# Patient Record
Sex: Female | Born: 1960 | Race: White | Hispanic: No | Marital: Married | Smoking: Former smoker
Health system: Southern US, Community
[De-identification: ages and names within clinical notes are randomized; demographics above are authoritative.]

## PROBLEM LIST (undated history)

## (undated) DIAGNOSIS — F329 Major depressive disorder, single episode, unspecified: Secondary | ICD-10-CM

## (undated) DIAGNOSIS — G1221 Amyotrophic lateral sclerosis: Secondary | ICD-10-CM

## (undated) DIAGNOSIS — K5900 Constipation, unspecified: Secondary | ICD-10-CM

## (undated) DIAGNOSIS — F32A Depression, unspecified: Secondary | ICD-10-CM

## (undated) DIAGNOSIS — R339 Retention of urine, unspecified: Secondary | ICD-10-CM

## (undated) DIAGNOSIS — K219 Gastro-esophageal reflux disease without esophagitis: Secondary | ICD-10-CM

## (undated) DIAGNOSIS — R1312 Dysphagia, oropharyngeal phase: Secondary | ICD-10-CM

## (undated) HISTORY — PX: NASAL SINUS SURGERY: SHX719

## (undated) HISTORY — DX: Dysphagia, oropharyngeal phase: R13.12

## (undated) HISTORY — DX: Constipation, unspecified: K59.00

## (undated) HISTORY — DX: Gastro-esophageal reflux disease without esophagitis: K21.9

## (undated) HISTORY — DX: Retention of urine, unspecified: R33.9

---

## 2004-05-29 ENCOUNTER — Ambulatory Visit: Payer: Self-pay | Admitting: Family Medicine

## 2006-03-16 ENCOUNTER — Ambulatory Visit: Payer: Self-pay | Admitting: Gastroenterology

## 2007-08-03 ENCOUNTER — Telehealth (INDEPENDENT_AMBULATORY_CARE_PROVIDER_SITE_OTHER): Payer: Self-pay | Admitting: Internal Medicine

## 2007-08-03 ENCOUNTER — Encounter (INDEPENDENT_AMBULATORY_CARE_PROVIDER_SITE_OTHER): Payer: Self-pay | Admitting: Internal Medicine

## 2007-08-03 ENCOUNTER — Ambulatory Visit: Payer: Self-pay | Admitting: Family Medicine

## 2007-08-03 DIAGNOSIS — S335XXA Sprain of ligaments of lumbar spine, initial encounter: Secondary | ICD-10-CM

## 2007-08-03 DIAGNOSIS — R030 Elevated blood-pressure reading, without diagnosis of hypertension: Secondary | ICD-10-CM

## 2007-08-03 DIAGNOSIS — F17200 Nicotine dependence, unspecified, uncomplicated: Secondary | ICD-10-CM | POA: Insufficient documentation

## 2007-08-03 DIAGNOSIS — E782 Mixed hyperlipidemia: Secondary | ICD-10-CM

## 2007-08-03 DIAGNOSIS — J218 Acute bronchiolitis due to other specified organisms: Secondary | ICD-10-CM | POA: Insufficient documentation

## 2007-08-24 ENCOUNTER — Ambulatory Visit: Payer: Self-pay | Admitting: Family Medicine

## 2007-08-24 DIAGNOSIS — M545 Low back pain: Secondary | ICD-10-CM

## 2007-08-25 ENCOUNTER — Encounter (INDEPENDENT_AMBULATORY_CARE_PROVIDER_SITE_OTHER): Payer: Self-pay | Admitting: Internal Medicine

## 2007-08-30 ENCOUNTER — Encounter (INDEPENDENT_AMBULATORY_CARE_PROVIDER_SITE_OTHER): Payer: Self-pay | Admitting: Internal Medicine

## 2007-08-30 LAB — CONVERTED CEMR LAB
Albumin: 4.6 g/dL (ref 3.5–5.2)
Basophils Absolute: 0 10*3/uL (ref 0.0–0.1)
Direct LDL: 216.2 mg/dL
Eosinophils Absolute: 0.2 10*3/uL (ref 0.0–0.6)
GFR calc Af Amer: 99 mL/min
GFR calc non Af Amer: 82 mL/min
HCT: 42.8 % (ref 36.0–46.0)
MCHC: 34.9 g/dL (ref 30.0–36.0)
MCV: 93.7 fL (ref 78.0–100.0)
Monocytes Absolute: 0.3 10*3/uL (ref 0.2–0.7)
Neutrophils Relative %: 69.3 % (ref 43.0–77.0)
Potassium: 4.5 meq/L (ref 3.5–5.1)
RBC: 4.57 M/uL (ref 3.87–5.11)
Sodium: 141 meq/L (ref 135–145)
TSH: 1.76 microintl units/mL (ref 0.35–5.50)
Triglycerides: 150 mg/dL — ABNORMAL HIGH (ref 0–149)

## 2007-09-22 ENCOUNTER — Ambulatory Visit: Payer: Self-pay | Admitting: Cardiology

## 2007-09-28 ENCOUNTER — Telehealth (INDEPENDENT_AMBULATORY_CARE_PROVIDER_SITE_OTHER): Payer: Self-pay | Admitting: Internal Medicine

## 2007-10-04 ENCOUNTER — Ambulatory Visit: Payer: Self-pay | Admitting: Family Medicine

## 2007-10-06 ENCOUNTER — Telehealth (INDEPENDENT_AMBULATORY_CARE_PROVIDER_SITE_OTHER): Payer: Self-pay | Admitting: Internal Medicine

## 2007-10-26 ENCOUNTER — Encounter (INDEPENDENT_AMBULATORY_CARE_PROVIDER_SITE_OTHER): Payer: Self-pay | Admitting: Internal Medicine

## 2007-11-21 ENCOUNTER — Ambulatory Visit: Payer: Self-pay | Admitting: Internal Medicine

## 2008-05-03 ENCOUNTER — Ambulatory Visit: Payer: Self-pay | Admitting: Family Medicine

## 2008-05-07 ENCOUNTER — Telehealth (INDEPENDENT_AMBULATORY_CARE_PROVIDER_SITE_OTHER): Payer: Self-pay | Admitting: Internal Medicine

## 2008-05-10 ENCOUNTER — Encounter (INDEPENDENT_AMBULATORY_CARE_PROVIDER_SITE_OTHER): Payer: Self-pay | Admitting: Internal Medicine

## 2008-05-21 ENCOUNTER — Telehealth (INDEPENDENT_AMBULATORY_CARE_PROVIDER_SITE_OTHER): Payer: Self-pay | Admitting: Internal Medicine

## 2008-11-07 ENCOUNTER — Ambulatory Visit: Payer: Self-pay | Admitting: Family Medicine

## 2008-11-07 DIAGNOSIS — F329 Major depressive disorder, single episode, unspecified: Secondary | ICD-10-CM

## 2008-11-07 DIAGNOSIS — J019 Acute sinusitis, unspecified: Secondary | ICD-10-CM

## 2008-12-04 ENCOUNTER — Telehealth (INDEPENDENT_AMBULATORY_CARE_PROVIDER_SITE_OTHER): Payer: Self-pay | Admitting: Internal Medicine

## 2009-04-11 ENCOUNTER — Ambulatory Visit: Payer: Self-pay | Admitting: Otolaryngology

## 2009-07-02 ENCOUNTER — Encounter: Payer: Self-pay | Admitting: Family Medicine

## 2009-07-09 ENCOUNTER — Telehealth (INDEPENDENT_AMBULATORY_CARE_PROVIDER_SITE_OTHER): Payer: Self-pay | Admitting: Internal Medicine

## 2009-11-27 ENCOUNTER — Encounter: Payer: Self-pay | Admitting: Family Medicine

## 2009-12-03 ENCOUNTER — Ambulatory Visit: Payer: Self-pay | Admitting: Family Medicine

## 2009-12-03 DIAGNOSIS — R1319 Other dysphagia: Secondary | ICD-10-CM

## 2009-12-04 LAB — CONVERTED CEMR LAB
Basophils Absolute: 0 10*3/uL (ref 0.0–0.1)
Bilirubin, Direct: 0.1 mg/dL (ref 0.0–0.3)
CO2: 30 meq/L (ref 19–32)
Calcium: 9.7 mg/dL (ref 8.4–10.5)
Direct LDL: 161.1 mg/dL
GFR calc non Af Amer: 94.48 mL/min (ref >60)
HDL: 51.6 mg/dL (ref >39.00)
Hemoglobin: 13.6 g/dL (ref 12.0–15.0)
Lymphs Abs: 2.4 10*3/uL (ref 0.7–4.0)
MCHC: 35.3 g/dL (ref 30.0–36.0)
MCV: 94.8 fL (ref 78.0–100.0)
Monocytes Relative: 4.2 % (ref 3.0–12.0)
Neutrophils Relative %: 62.9 % (ref 43.0–77.0)
Platelets: 260 10*3/uL (ref 150.0–400.0)
Potassium: 4.2 meq/L (ref 3.5–5.1)
RBC: 4.06 M/uL (ref 3.87–5.11)
RDW: 12.3 % (ref 11.5–14.6)
Sodium: 143 meq/L (ref 135–145)
Total Bilirubin: 0.5 mg/dL (ref 0.3–1.2)
Triglycerides: 260 mg/dL — ABNORMAL HIGH (ref 0.0–149.0)
VLDL: 52 mg/dL — ABNORMAL HIGH (ref 0.0–40.0)

## 2010-03-19 ENCOUNTER — Encounter: Payer: Self-pay | Admitting: Family Medicine

## 2010-04-14 ENCOUNTER — Ambulatory Visit: Payer: Self-pay | Admitting: Family Medicine

## 2010-04-21 ENCOUNTER — Encounter: Payer: Self-pay | Admitting: Family Medicine

## 2010-05-22 ENCOUNTER — Telehealth: Payer: Self-pay | Admitting: Family Medicine

## 2010-07-04 ENCOUNTER — Ambulatory Visit: Payer: Self-pay | Admitting: Family Medicine

## 2010-07-04 DIAGNOSIS — F4323 Adjustment disorder with mixed anxiety and depressed mood: Secondary | ICD-10-CM

## 2010-07-04 DIAGNOSIS — R471 Dysarthria and anarthria: Secondary | ICD-10-CM

## 2010-07-15 ENCOUNTER — Ambulatory Visit: Payer: Self-pay | Admitting: Psychology

## 2010-07-25 ENCOUNTER — Telehealth: Payer: Self-pay | Admitting: Family Medicine

## 2010-07-30 ENCOUNTER — Encounter: Payer: Self-pay | Admitting: Family Medicine

## 2010-08-08 ENCOUNTER — Encounter: Payer: Self-pay | Admitting: Family Medicine

## 2010-08-12 ENCOUNTER — Telehealth: Payer: Self-pay | Admitting: Family Medicine

## 2010-08-13 ENCOUNTER — Encounter: Payer: Self-pay | Admitting: Family Medicine

## 2010-08-18 ENCOUNTER — Encounter: Payer: Self-pay | Admitting: Family Medicine

## 2010-08-26 ENCOUNTER — Ambulatory Visit: Admit: 2010-08-26 | Payer: Self-pay | Admitting: Psychology

## 2010-08-26 NOTE — Progress Notes (Signed)
Summary: Celebrex directions  Phone Note Call from Patient Call back at [phone]   Caller: Patient Call For: Everrett Coombe, FNP Summary of Call: Pt was seen earlier today and was given samples of Celebrex.  Pt needs to know how she is to take this?  Initial call taken by: Sydell Axon,  August 03, 2007 2:17 PM  Follow-up for Phone Call        1 each day with food in stomach, do not take IBP or Aleve on same day you take Celebrex, can take tylenoll if needed      ..................................................................Marland KitchenBillie-Lynn Tyler Deis FNP  August 03, 2007 5:14 PM    Additional Follow-up for Phone Call Additional follow up Details #1::        pc to pt, given instructions per billie. Additional Follow-up by: Cooper Render,  August 03, 2007 5:18 PM

## 2010-08-26 NOTE — Miscellaneous (Signed)
Summary: Allergy Testing/[facility]  Allergy Testing/[facility]   Imported By: Lanelle Bal 12/06/2009 13:07:12  _____________________________________________________________________  External Attachment:    Type:   Image     Comment:   External Document

## 2010-08-26 NOTE — Assessment & Plan Note (Signed)
Summary: DISCUSS MEDICATION/CLE   Vital Signs:  Patient profile:   50 year old female Height:      59 inches Weight:      124.75 pounds BMI:     25.29 Temp:     98.5 degrees F oral Pulse rate:   72 / minute Pulse rhythm:   regular BP sitting:   140 / 90  (right arm) Cuff size:   regular  Vitals Entered By: Linde Gillis CMA Duncan Dull) (July 04, 2010 12:25 PM) CC: discuss medications   History of Present Illness: 73 with progressive dysarthria here to discuss several issues.  Has had progressive dysarthria/dysphagia.  Followed at [facility] by Dr. Alphonzo Dublin.  Last saw im on 04/21/10.  Notes reviewed.    ?bulbular onset MND/ALS.  Pt feels that she cannot possibly have ALS as her symtpoms have been the same for three years- mainly minimal dyshpagia, dystharia and pseydobulbar affect.    Neuro offered to place her on Rilutek for ALS and Paxil for pseudobulbar affect but she declined both.  Too many side effects that she felt were not worth it.  Wants speech therapy referral.  Wellbutrin has been helping with her mood on most days but does get anxious around doctor's appointments and having difficulty sleeping.  Xaxax was helping her fall asleep but wakes up 2 hours later.    .  Current Medications (verified): 1)  Claritin 10 Mg  Tabs (Loratadine) .... As Needed 2)  Fish Oil 500 Mg  Caps (Omega-3 Fatty Acids) .[name].. 1 Two Times A Day 3)  Wellbutrin Sr 150 Mg Xr12h-Tab (Bupropion Hcl) .... Take 2 Each Morning For Depression By Mouth 4)  Zantac 150 Mg Tabs (Ranitidine Hcl) .... Take One By Mouth Daily As Needed 5)  Omnaris 50 Mcg/act Susp (Ciclesonide) .... One or Two Sprays Each Nostril Once Daily 6)  Xyzal 5 Mg Tabs (Levocetirizine Dihydrochloride) .... Take 1 Tablet By Mouth Once A Day 7)  Dexilant 60 Mg Cpdr (Dexlansoprazole) .... Take 1 Tablet By Mouth Once A Day 8)  Singulair 10 Mg Tabs (Montelukast Sodium) .... Take 1 Tablet By Mouth Once A Day 9)  Allergy Injections  .... Weekly 10)  Niaspan 500 Mg Tbcr (Niacin (Antihyperlipidemic)) .[name].. 1 Tab By Mouth Daily. 11)  Ambien Cr 6.25 Mg  Tbcr (Zolpidem Tartrate) .[name].. 1 By Mouth At Bedtimeas Needed  Allergies (verified): No Known Drug Allergies  Review of Systems      See HPI Psych:  Denies panic attacks, sense of great danger, suicidal thoughts/plans, thoughts of violence, unusual visions or sounds, and thoughts /plans of harming others.  Physical Exam  General:  alert, well-developed, well-nourished, and well-hydrated.  NAD dysphonia about the same Psych:  normally interactive, good eye contact, not anxious appearing, and not depressed appearing.     Impression & Recommendations:  Problem # 1:  DYSARTHRIA (ICD-784.51) Assessment Unchanged Time spent with patient 25 minutes, more than 50% of this time was spent counseling patient on her symptoms and her associated anxiety and depression.  Entirely understandable that this would be upsetting to her.  Will refer to speech therapy.    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  Continue Wellbutrin, refer for psychotherapy.   Ambien as needed insomnia, advised NEVER to take with benzo.  Complete Medication List: 1)  Claritin 10 Mg Tabs (Loratadine) .... As needed 2)  Fish Oil 500 Mg  Caps (Omega-3 fatty acids) .[name].. 1 two times a day 3)  Wellbutrin Sr 150 Mg Xr12h-tab (Bupropion hcl) .... Take 2 each morning for depression by mouth 4)  Zantac 150 Mg Tabs (Ranitidine hcl) .... Take one by mouth daily as needed 5)  Omnaris 50 Mcg/act Susp (Ciclesonide) .... One or two sprays each nostril once daily 6)  Xyzal 5 Mg Tabs (Levocetirizine dihydrochloride) .... Take 1 tablet by mouth once a day 7)  Dexilant 60 Mg Cpdr (Dexlansoprazole) .... Take 1 tablet by mouth once a day 8)  Singulair 10 Mg Tabs (Montelukast sodium) .... Take 1 tablet by mouth once a  day 9)  Allergy Injections  .... Weekly 10)  Niaspan 500 Mg Tbcr (Niacin (antihyperlipidemic)) .[name].. 1 tab by mouth daily. 11)  Ambien Cr 6.25 Mg Tbcr (Zolpidem tartrate) .[name].. 1 by mouth at bedtimeas needed  Other Orders: Psychology Referral (Psychology)  Patient Instructions: 1)  Please stop by to see Shirlee Limerick on your way out. Prescriptions: AMBIEN CR 6.25 MG  TBCR (ZOLPIDEM TARTRATE) 1 by mouth at bedtimeas needed  #30 x 0   Entered and Authorized by:   Ruthe Mannan MD   Signed by:   Ruthe Mannan MD on 07/04/2010   Method used:   Print then Give to Patient   RxID:   [id number]    Orders Added: 1)  Speech Therapy [Speech Therapy] 2)  Psychology Referral [Psychology] 3)  Est. Patient Level IV [14782]    Current Allergies (reviewed today): No known allergies

## 2010-08-26 NOTE — Letter (Signed)
Summary: [facility]  [facility]   Imported By: Lester [place] 07/03/2010 13:05:37  _____________________________________________________________________  External Attachment:    Type:   Image     Comment:   External Document

## 2010-08-26 NOTE — Letter (Signed)
Summary: [facility]  [facility]   Imported By: Lanelle Bal 12/19/2009 08:38:44  _____________________________________________________________________  External Attachment:    Type:   Image     Comment:   External Document

## 2010-08-26 NOTE — Assessment & Plan Note (Signed)
Summary: thyroid issues, allergies, Jennifer [name]   Vital Signs:  [name] profile:   51 year old female Height:      59 inches Weight:      124 pounds BMI:     25.14 Temp:     98.2 degrees F oral Pulse rate:   88 / minute Pulse rhythm:   regular BP sitting:   132 / 100  (left arm) Cuff size:   regular  Vitals Entered By: Delilah Shan CMA Duncan Dull) (Dec 03, 2009 11:30 AM) CC: Thyroid issues.  Allergies   History of Present Illness: 50 yo new to me here for ? thyroid issues.  Bring in notes from ENT today.  She has been seeing Dr. Chestine Spore at [facility], most recently on 11/01/2009. Has had progressive dysarthria/dysphagia.  Also had sinonasal debridement. According to ENT notes, they feel that ther dysarthria points to something neurological. Has neuro appt in June.  She is here today because she has been noticing increased constipation, cold intolerance. Always had issues with insomnia. No CP, SOB, DOE or LE edema.    H/o HLD- has not been checked since 2009 but at that time LDL was 216! Cannot tolerate statins.  Never tried Niacin or other medications other than fish oil.  Current Medications (verified): 1)  Claritin 10 Mg  Tabs (Loratadine) .... As Needed 2)  Fish Oil 500 Mg  Caps (Omega-3 Fatty Acids) .[name].. 1 Two Times A Day 3)  Zyban 150 Mg  Tb12 (Bupropion Hcl (Smoking Deter)) .[name].. 1 Once Daily For 7 Days, Then Increase To 2 Once Daily For 21 Days 4)  Wellbutrin Sr 150 Mg Xr12h-Tab (Bupropion Hcl) .... Take 2 Each Morning For Depression By Mouth 5)  Zantac 150 Mg Tabs (Ranitidine Hcl) .... Take One By Mouth Daily As Needed 6)  Omnaris 50 Mcg/act Susp (Ciclesonide) .... One or Two Sprays Each Nostril Once Daily 7)  Xyzal 5 Mg Tabs (Levocetirizine Dihydrochloride) .... Take 1 Tablet By Mouth Once A Day 8)  Dexilant 60 Mg Cpdr (Dexlansoprazole) .... Take 1 Tablet By Mouth Once A Day 9)  Singulair 10 Mg Tabs (Montelukast Sodium) .... Take 1 Tablet By Mouth Once A Day 10)   Allergy Injections .... Weekly  Allergies (verified): No Known Drug Allergies  Past History:  Family History: Last updated: 11/21/2007 Father:  69--L&W--gout Mother: died  at 64--MI , COPD, on liver transport list, diverticulosis, thyroid Siblings: 2 sis--1 died of complications of abd surgery at age 73               depression, thyroid                          !--elevated lipids, Wolf Parkinsons since age 33                3 br--1--kidney ca                          1--depression                         1--HBP  DM-0 MI-MGM, MGF,PGF(died at 51) CVA- 0 Prostate Cancer-0 Breast Cancer-0 Ovarian Cancer-0 Uterine Cancer-0 Colon Cancer-0 Drug/ ETOH Abuse-0 Depression-   Social History: Last updated: 08/03/2007 Marital Status: Married Children: 1 daughter--age 71--severe reflux since infancy Occupation: stay at home  Risk Factors: Caffeine Use: 5+ (08/03/2007) Exercise: yes (08/03/2007)  Risk Factors:  Smoking Status: current (08/03/2007) Packs/Day: 4 cigs qd (05/03/2008)  Review of Systems      See HPI General:  Denies chills and fever. ENT:  Complains of difficulty swallowing. CV:  Denies chest pain or discomfort. Resp:  Denies shortness of breath. GI:  Denies abdominal pain and change in bowel habits. Endo:  Complains of cold intolerance; denies weight change.  Physical Exam  General:  alert, well-developed, well-nourished, and well-hydrated.  NAD Ears:  R ear normal and L ear normal.   Nose:  no external deformity.   Mouth:  Oral mucosa and oropharynx without lesions or exudates.  Teeth in good repair. Neck:  supple, full ROM, and no masses.   Lungs:  Normal respiratory effort, chest expands symmetrically. Lungs are clear to auscultation, no crackles or wheezes. Heart:  normal rate, regular rhythm, and no murmur.   Extremities:  no edema Neurologic:  alert & oriented X3.   Psych:  normally interactive, good eye contact, not anxious appearing, and not  depressed appearing.     Impression & Recommendations:  Problem # 1:  OTHER DYSPHAGIA (WJX-914.78) Assessment New Records reviewed.  Agreed that checking TSH given her other symptoms makes sense.  Strongly encouraged her to keep neurology appt as I agree this does seem bulbar or pseudobulbar. Orders: Venipuncture (29[address]) TLB-TSH (Thyroid Stimulating Hormone) (84443-TSH) TLB-CBC Platelet - w/Differential (85025-CBCD)  Problem # 2:  HYPERLIPIDEMIA (ICD-272.2) Assessment: Unchanged Recheck lipid panel.  Likely needs another agent, like Niacin. Orders: Venipuncture (13086) TLB-Hepatic/Liver Function Pnl (80076-HEPATIC) TLB-Lipid Panel (80061-LIPID)  Complete Medication List: 1)  Claritin 10 Mg Tabs (Loratadine) .... As needed 2)  Fish Oil 500 Mg Caps (Omega-3 fatty acids) .[name].. 1 two times a day 3)  Zyban 150 Mg Tb12 (Bupropion hcl (smoking deter)) .[name].. 1 once daily for 7 days, then increase to 2 once daily for 21 days 4)  Wellbutrin Sr 150 Mg Xr12h-tab (Bupropion hcl) .... Take 2 each morning for depression by mouth 5)  Zantac 150 Mg Tabs (Ranitidine hcl) .... Take one by mouth daily as needed 6)  Omnaris 50 Mcg/act Susp (Ciclesonide) .... One or two sprays each nostril once daily 7)  Xyzal 5 Mg Tabs (Levocetirizine dihydrochloride) .... Take 1 tablet by mouth once a day 8)  Dexilant 60 Mg Cpdr (Dexlansoprazole) .... Take 1 tablet by mouth once a day 9)  Singulair 10 Mg Tabs (Montelukast sodium) .... Take 1 tablet by mouth once a day 10)  Allergy Injections  .... Weekly  Other Orders: TLB-BMP (Basic Metabolic Panel-BMET) (80048-METABOL)  Current Allergies (reviewed today): No known allergies

## 2010-08-26 NOTE — Miscellaneous (Signed)
Summary: Allergy Testing & Discharge Instructions/[facility]  Allergy Testing & Discharge Instructions/[facility]   Imported By: Lanelle Bal 12/06/2009 13:08:38  _____________________________________________________________________  External Attachment:    Type:   Image     Comment:   External Document

## 2010-08-26 NOTE — Letter (Signed)
Summary: [facility]  [facility]   Imported By: Lanelle Bal 04/08/2010 13:12:55  _____________________________________________________________________  External Attachment:    Type:   Image     Comment:   External Document

## 2010-08-26 NOTE — Assessment & Plan Note (Signed)
Summary: ?SINUS INFECTION/CLE   Vital Signs:  Patient profile:   50 year old female Height:      59 inches Weight:      125 pounds BMI:     25.34 Temp:     98.0 degrees F oral Pulse rate:   72 / minute Pulse rhythm:   regular BP sitting:   140 / 92  (right arm) Cuff size:   regular  Vitals Entered By: Linde Gillis CMA Duncan Dull) (April 14, 2010 12:16 PM) CC: ? sinus infection and anxiety   History of Present Illness: 50 yo new to me here for ? sinus infection and anxiety.  Anxiety-  Has had progressive dysarthria/dysphagia.  Also had sinonasal debridement. ENT referred to neurolgiy which has referred her to ALS clinic at [facility] to rule out ALS Pt is here because she is obviously anxious and distressed about this  WEllbutrin has been helping with her mood on most days but does get anxious around doctor's appointments and having difficulty sleeping.   ?sinus infection-  past 1 /2 weeks, more congestion, now has sinus pressure, body aches.  Nose was running but now just "stuffed."  She is s/p sinonasal debridement.  No fever, no cough.  Current Medications (verified): 1)  Claritin 10 Mg  Tabs (Loratadine) .... As Needed 2)  Fish Oil 500 Mg  Caps (Omega-3 Fatty Acids) .[name].. 1 Two Times A Day 3)  Wellbutrin Sr 150 Mg Xr12h-Tab (Bupropion Hcl) .... Take 2 Each Morning For Depression By Mouth 4)  Zantac 150 Mg Tabs (Ranitidine Hcl) .... Take One By Mouth Daily As Needed 5)  Omnaris 50 Mcg/act Susp (Ciclesonide) .... One or Two Sprays Each Nostril Once Daily 6)  Xyzal 5 Mg Tabs (Levocetirizine Dihydrochloride) .... Take 1 Tablet By Mouth Once A Day 7)  Dexilant 60 Mg Cpdr (Dexlansoprazole) .... Take 1 Tablet By Mouth Once A Day 8)  Singulair 10 Mg Tabs (Montelukast Sodium) .... Take 1 Tablet By Mouth Once A Day 9)  Allergy Injections .... Weekly 10)  Niaspan 500 Mg Tbcr (Niacin (Antihyperlipidemic)) .[name].. 1 Tab By Mouth Daily. 11)  Azithromycin 250 Mg  Tabs (Azithromycin) .... 2  By  Mouth Today and Then 1 Daily For 4 Days 12)  Alprazolam 0.25 Mg Tabs (Alprazolam) .[name].. 1-2 Tab By Mouth Three Times A Day As Needed Anxiety.  Allergies (verified): No Known Drug Allergies  Past History:  Past Surgical History: Last updated: 08/03/2007 C'section  Family History: Last updated: 11/21/2007 Father:  69--L&W--gout Mother: died  at 64--MI , COPD, on liver transport list, diverticulosis, thyroid Siblings: 2 sis--1 died of complications of abd surgery at age 79               depression, thyroid                          !--elevated lipids, Wolf Parkinsons since age 36                3 br--1--kidney ca                          1--depression                         1--HBP  DM-0 MI-MGM, MGF,PGF(died at 20) CVA- 0 Prostate Cancer-0 Breast Cancer-0 Ovarian Cancer-0 Uterine Cancer-0 Colon Cancer-0 Drug/ ETOH Abuse-0 Depression-   Social History: Last  updated: 08/03/2007 Marital Status: Married Children: 1 daughter--age 17--severe reflux since infancy Occupation: stay at home  Risk Factors: Caffeine Use: 5+ (08/03/2007) Exercise: yes (08/03/2007)  Risk Factors: Smoking Status: current (08/03/2007) Packs/Day: 4 cigs qd (05/03/2008)  Review of Systems      See HPI General:  Denies fever. Eyes:  Denies blurring. ENT:  Complains of postnasal drainage and sinus pressure. Resp:  Denies cough. Psych:  Denies mental problems, panic attacks, sense of great danger, suicidal thoughts/plans, thoughts of violence, unusual visions or sounds, and thoughts /plans of harming others.  Physical Exam  General:  alert, well-developed, well-nourished, and well-hydrated.  NAD dysphonia about the same Nose:  no external deformity.  nasal dischargemucosal pallor.   frontal sinuses TTP Mouth:  Oral mucosa and oropharynx without lesions or exudates.  Teeth in good repair. Lungs:  Normal respiratory effort, chest expands symmetrically. Lungs are clear to auscultation, no  crackles or wheezes. Heart:  normal rate, regular rhythm, and no murmur.   Extremities:  no edema Neurologic:  alert & oriented X3.   Psych:  normally interactive, good eye contact, not anxious appearing, and not depressed appearing.     Impression & Recommendations:  Problem # 1:  SINUSITIS- ACUTE-NOS (ICD-461.9) Assessment New Continue with Omnaris daily, Zpack.  Continue supportive care with saline irrigation. Her updated medication list for this problem includes:    Omnaris 50 Mcg/act Susp (Ciclesonide) ..... One or two sprays each nostril once daily    Azithromycin 250 Mg Tabs (Azithromycin) .[name]... 2 by  mouth today and then 1 daily for 4 days  Problem # 2:  ? 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  Will add Alprazolam as needed to her scheduled Wellbutrin.  Also asked her to please call me with an udpate after her appointment at [facility].  Complete Medication List: 1)  Claritin 10 Mg Tabs (Loratadine) .... As needed 2)  Fish Oil 500 Mg Caps (Omega-3 fatty acids) .[name].. 1 two times a day 3)  Wellbutrin Sr 150 Mg Xr12h-tab (Bupropion hcl) .... Take 2 each morning for depression by mouth 4)  Zantac 150 Mg Tabs (Ranitidine hcl) .... Take one by mouth daily as needed 5)  Omnaris 50 Mcg/act Susp (Ciclesonide) .... One or two sprays each nostril once daily 6)  Xyzal 5 Mg Tabs (Levocetirizine dihydrochloride) .... Take 1 tablet by mouth once a day 7)  Dexilant 60 Mg Cpdr (Dexlansoprazole) .... Take 1 tablet by mouth once a day 8)  Singulair 10 Mg Tabs (Montelukast sodium) .... Take 1 tablet by mouth once a day 9)  Allergy Injections  .... Weekly 10)  Niaspan 500 Mg Tbcr (Niacin (antihyperlipidemic)) .[name].. 1 tab by mouth daily. 11)  Azithromycin 250 Mg Tabs (Azithromycin) ....  2 by  mouth today and then 1 daily for 4 days 12)  Alprazolam 0.25 Mg Tabs (Alprazolam) .[name].. 1-2 tab by mouth three times a day as needed anxiety. Prescriptions: ALPRAZOLAM 0.25 MG TABS (ALPRAZOLAM) 1-2 tab by mouth three times a day as needed anxiety.  #60 x 0   Entered and Authorized by:   Ruthe Mannan MD   Signed by:   Ruthe Mannan MD on 04/14/2010   Method used:   Print then Give to Patient   RxID:   [id number] AZITHROMYCIN 250 MG  TABS (AZITHROMYCIN)  2 by  mouth today and then 1 daily for 4 days  #6 x 0   Entered and Authorized by:   Ruthe Mannan MD   Signed by:   Ruthe Mannan MD on 04/14/2010   Method used:   Electronically to        [id number]       [address]       [place], [place]  [address]       Ph: [phone]       Fax: [phone]   RxID:   [id number]   Current Allergies (reviewed today): No known allergies

## 2010-08-26 NOTE — Progress Notes (Signed)
Summary: refill request for alprazolam  Phone Note Refill Request Message from:  Fax from Pharmacy  Refills Requested: Medication #1:  ALPRAZOLAM 0.25 MG TABS 1-2 tab by mouth three times a day as needed anxiety..   Last Refilled: 04/14/2010 Faxed request from [facility].  [phone]  Initial call taken by: Lowella Petties CMA, AAMA,  May 22, 2010 2:58 PM  Follow-up for Phone Call        Rx called to pharmacy Follow-up by: Linde Gillis CMA Duncan Dull),  May 22, 2010 3:44 PM    Prescriptions: ALPRAZOLAM 0.25 MG TABS (ALPRAZOLAM) 1-2 tab by mouth three times a day as needed anxiety.  #60 x 0   Entered and Authorized by:   Ruthe Mannan MD   Signed by:   Ruthe Mannan MD on 05/22/2010   Method used:   [facility]ph[facility] to ...       [facility]  [address] [id number]       [address]       [place], [place]  [address]       Ph: [phone]       Fax: [phone]   RxID:   [id number]

## 2010-08-27 ENCOUNTER — Encounter: Payer: Self-pay | Admitting: Family Medicine

## 2010-08-28 NOTE — Progress Notes (Signed)
Summary: wants to change ambien  Phone Note Call from Patient Call back at [phone]   Caller: Spouse Don Summary of Call: Pt is taking ambien cr 6.25 and she is only sleeping about 2 hours off and on.  She is asking to change to 10 mg's generic.  [facility]. Initial call taken by: Lowella Petties CMA, AAMA,  July 25, 2010 10:14 AM  Follow-up for Phone Call        Called to pharmacy. Follow-up by: Lowella Petties CMA, AAMA,  July 25, 2010 10:24 AM    New/Updated Medications: AMBIEN 10 MG TABS (ZOLPIDEM TARTRATE) 1 tab by mouth at bedtime as needed insomnia. Prescriptions: AMBIEN 10 MG TABS (ZOLPIDEM TARTRATE) 1 tab by mouth at bedtime as needed insomnia.  #30 x 0   Entered and Authorized by:   Ruthe Mannan MD   Signed by:   Lowella Petties CMA, AAMA on 07/25/2010   Method used:   [facility]phoned to ...       [facility]  [address] [id number]       [address]       [place], [place]  [address]       Ph: [phone]       Fax: [phone]   RxID:   [id number]

## 2010-08-28 NOTE — Progress Notes (Signed)
Summary: wants referral to Dr. Serina Cowper   Phone Note Call from Patient Call back at Home Phone [phone] Call back at [phone]   Caller: Dorinda Hill  Summary of Call: Patient is asking for a referral to Dr.Rick Bedrick with [facility] for her ALS becuase she wants a second opinion. The # to his office is [phone].  Initial call taken by: Melody Comas,  August 12, 2010 2:29 PM

## 2010-08-28 NOTE — Miscellaneous (Signed)
Summary: Certification Treatment Plan/[facility]  Certification Treatment Plan/[facility]   Imported By: Maryln Gottron 08/19/2010 09:51:29  _____________________________________________________________________  External Attachment:    Type:   Image     Comment:   External Document

## 2010-09-01 ENCOUNTER — Telehealth: Payer: Self-pay | Admitting: Family Medicine

## 2010-09-11 NOTE — Progress Notes (Signed)
  Phone Note Call from Patient   Caller: Spouse Summary of Call: I called the patients husband, Dorinda Hill to check on the referral to [facility] appt. After faxing all the records to [facility] they contact the patient to give them the appt info. [facility] has contacted them to say that the Dr they want to see is only in the clinic 1 day a week and the appts are into early April. They have the persons contact information at [facility] and will wait to hear back from them. Initial call taken by: Carlton Adam,  September 01, 2010 8:29 AM  Follow-up for Phone Call        thank you. Ruthe Mannan MD  September 01, 2010 8:36 AM

## 2010-09-23 NOTE — Letter (Signed)
Summary: E-mail from Dr.Perrin to Dr.Miller Edgington  E-mail from Dr.Perrin to Dr.Eartha Vonbehren   Imported By: Beau Fanny 09/18/2010 10:49:15  _____________________________________________________________________  External Attachment:    Type:   Image     Comment:   External Document

## 2010-10-02 NOTE — Miscellaneous (Signed)
Summary: SLP Discharge/[facility]  SLP Discharge/[facility]   Imported By: Sherian Rein 09/22/2010 10:23:18  _____________________________________________________________________  External Attachment:    Type:   Image     Comment:   External Document  Appended Document: SLP Discharge/[facility] improved, less nasal speech.

## 2010-10-06 ENCOUNTER — Telehealth: Payer: Self-pay | Admitting: Family Medicine

## 2010-10-14 NOTE — Progress Notes (Signed)
Summary: Rx Bupropion  Phone Note Refill Request Call back at [phone] Message from:  [facility] on October 06, 2010 4:54 PM  Refills Requested: Medication #1:  WELLBUTRIN SR 150 MG XR12H-TAB take 2 each morning for depression by mouth   Last Refilled: 09/04/2010 Received faxed refill request.  Please advise.   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       [place], [place]  [address]       Ph: [phone]       Fax: [phone]   RxID:   [id number]

## 2010-11-06 ENCOUNTER — Other Ambulatory Visit: Payer: Self-pay | Admitting: *Deleted

## 2010-11-06 MED ORDER — ZOLPIDEM TARTRATE 10 MG PO TABS: 10.0000 mg | ORAL_TABLET | Freq: Every evening | ORAL | Status: DC | PRN

## 2010-11-06 NOTE — Telephone Encounter (Signed)
Rx called to pharmacy

## 2010-12-09 NOTE — Assessment & Plan Note (Signed)
[facility]                               LIPID CLINIC NOTE   Jennifer [name], Jennifer [name]                      MRN:          [id number]  DATE:09/22/2007                            DOB:          [birth year]    Jennifer [name] comes in today for initial visit with [place] here in the  Hyperlipidemia Clinic.  She was referred to [place] by Everrett Coombe, FNP.   PAST MEDICAL HISTORY:  Includes hypertension, hyperlipidemia and chronic  low back pain.  Current medications include omega 3 fish oils 1 gram  twice daily, Ultram as needed, Claritin as needed.  She has no known  drug allergies although she has had some trouble with Caduet and Lipitor  in the past, both causing severe muscle aches all over and that really  limited her physical activity, as well as she reports a blotchy rash and  sweating, and also some weight gain while on Caduet and then on Lipitor.   SOCIAL HISTORY:  She smokes one-pack of cigarettes per day.  She does  not consume alcohol.  She maintains a heart healthy diet reporting  oatmeal for breakfast most days, salad with chicken for lunch most days.  No fried foods at all.  Red meat probably only twice per month.  As far  as exercise, she reports that she walks three miles per day.  This is  either outside on her other street or inside on her treadmill.   FAMILY HISTORY:  Her mother had an MI at age of 12 and her sister has  hyperlipidemia.  Her grandmother had a stroke at age 20.  Her  grandfather had an MI in his 28s.   PHYSICAL EXAM:  Her weight is 135 pounds, blood pressure is 120/60,  heart rate is 68.   Laboratory data includes total cholesterol 295, triglycerides 150, HDL  44, LDL 216.2.  Liver function tests are within normal limits.   ASSESSMENT:  Jennifer [name] LDL is elevated.  The goal for her being less  than 100.  Her HDL is good greater than the goal of 40.  Her  triglycerides are okay.  The goal being less than 150.  We talked about  smoking  cessation.  She has tried nicotine patches in the past.  I asked  her about symptoms when she is off cigarettes and she says it makes her  mean and affects her mood mostly.  We talked about the use of Wellbutrin  in that case and I she would be willing to try Wellbutrin, and I will  told her I would contact Billie Bean explore that further.  Jennifer [name] is  very reluctant to start a statin because of her experience with Caduet  and Lipitor.  We talked about the differences between the statins and  the incidences myopathies with each being different with different  statins.  Also talked about statins being the best class of medication  with the most data and then most favorable intact cholesterol panels.  She basically refuses even a trial of any statin at all.  We talked  about other options being Zetia to lower her LDL, but that it probably  is not powerful enough to get LDL to goal on its own.  We talked about  Welchol and bile acid sequestrants, and the GI side effects and the  multiple daily doses.   PLAN:  We are going to try Zetia 10 mg daily.  Samples were given to  her.  I encouraged her to continue with her fish oil.  We will follow-up  with her in 6 weeks and obtain a lipid and liver panel at that time and  go from there.  She was instructed to call us with any questions or  problems in  the meantime and again I reinforced the fact that Zetia will likely not  be strong enough on its own to get up her LDL to goal and then we would  consider options at the next appointment.      Charolotte Eke, PharmD  Electronically Signed      Rollene Rotunda, MD, [facility]  Electronically Signed   TP/MedQ  DD: 09/22/2007  DT: 09/23/2007  Job #: [id number]   cc:   Billie D. Bean, FNP

## 2011-01-07 ENCOUNTER — Other Ambulatory Visit: Payer: Self-pay | Admitting: *Deleted

## 2011-01-07 MED ORDER — ZOLPIDEM TARTRATE 10 MG PO TABS: 10.0000 mg | ORAL_TABLET | Freq: Every evening | ORAL | Status: DC | PRN

## 2011-01-08 NOTE — Telephone Encounter (Signed)
[facility] 

## 2011-05-01 ENCOUNTER — Other Ambulatory Visit: Payer: Self-pay | Admitting: *Deleted

## 2011-05-01 MED ORDER — BUPROPION HCL ER (SR) 150 MG PO TB12: ORAL_TABLET | ORAL | Status: DC

## 2011-05-01 NOTE — Telephone Encounter (Signed)
Last refill 10/07/2010

## 2011-08-31 ENCOUNTER — Other Ambulatory Visit: Payer: Self-pay | Admitting: *Deleted

## 2011-08-31 MED ORDER — BUPROPION HCL ER (SR) 150 MG PO TB12: ORAL_TABLET | ORAL | Status: DC

## 2011-08-31 NOTE — Telephone Encounter (Signed)
Okay to refill? 

## 2011-09-15 ENCOUNTER — Ambulatory Visit: Payer: Self-pay | Admitting: Family Medicine

## 2011-09-25 ENCOUNTER — Encounter: Payer: Self-pay | Admitting: Family Medicine

## 2011-09-28 ENCOUNTER — Encounter: Payer: Self-pay | Admitting: Family Medicine

## 2011-09-28 ENCOUNTER — Ambulatory Visit (INDEPENDENT_AMBULATORY_CARE_PROVIDER_SITE_OTHER): Payer: BC Managed Care – PPO | Admitting: Family Medicine

## 2011-09-28 VITALS — BP 150/102 | HR 72 | Temp 98.5°F | Wt 135.0 lb

## 2011-09-28 DIAGNOSIS — R300 Dysuria: Secondary | ICD-10-CM

## 2011-09-28 DIAGNOSIS — G1221 Amyotrophic lateral sclerosis: Secondary | ICD-10-CM | POA: Insufficient documentation

## 2011-09-28 LAB — POCT URINALYSIS DIPSTICK
Glucose, UA: NEGATIVE
Ketones, UA: NEGATIVE
Leukocytes, UA: NEGATIVE
Urobilinogen, UA: NEGATIVE

## 2011-09-28 MED ORDER — CIPROFLOXACIN HCL 500 MG PO TABS: 500.0000 mg | ORAL_TABLET | Freq: Two times a day (BID) | ORAL | Status: AC

## 2011-09-28 MED ORDER — ALPRAZOLAM 0.25 MG PO TABS: 0.2500 mg | ORAL_TABLET | Freq: Every evening | ORAL | Status: DC | PRN

## 2011-09-28 NOTE — Progress Notes (Signed)
SUBJECTIVE: Jennifer [name] is a 51 y.o. femalewith h/o ?ALS  who complains of urinary frequency, urgency and dysuria x 14 days, without  fever, chills, or abnormal vaginal discharge or bleeding.  Her back has been hurting and feels a little more "wobbly" on her legs since these symptoms started.  Being followed by neuro for probable ALS.  Patient Active Problem List  Diagnoses  . HYPERLIPIDEMIA  . TOBACCO ABUSE  . ADJUSTMENT DISORDER WITH MIXED FEATURES  . DEPRESSION  . SINUSITIS- ACUTE-NOS  . ACUTE BRONCHIOLITIS DUE OTH INFECTIOUS ORGANISMS  . BACK PAIN, LUMBAR  . DYSARTHRIA  . OTHER DYSPHAGIA  . ELEVATED BLOOD PRESSURE WITHOUT DIAGNOSIS OF HYPERTENSION  . LUMBAR STRAIN   No past medical history on file. Past Surgical History  Procedure Date  . Cesarean section    History  Substance Use Topics  . Smoking status: Former Games developer  . Smokeless tobacco: Not on file  . Alcohol Use: Not on file   Family History  Problem Relation Age of Onset  . COPD Mother   . Thyroid disease Mother   . Diverticulosis Mother   . Heart attack Mother   . Gout Father   . Depression Sister   . Thyroid disease Sister   . Cancer Brother     kidney  . Heart attack Maternal Grandmother   . Heart attack Maternal Grandfather   . Heart attack Paternal Grandfather   . Hyperlipidemia Sister   . Evelene Croon Parkinson White syndrome Sister 25  . Depression Brother   . Hypertension Brother    Allergies not on file Current Outpatient Prescriptions on File Prior to Visit  Medication Sig Dispense Refill  . buPROPion (WELLBUTRIN SR) 150 MG 12 hr tablet Take two tablets by mouth every morning for depression  60 tablet  3  . dexlansoprazole (DEXILANT) 60 MG capsule Take 60 mg by mouth daily.      [name] loratadine (CLARITIN) 10 MG tablet Take one tablet by mouth daily as needed      . ranitidine (ZANTAC) 150 MG capsule Take 150 mg by mouth daily.      . ciclesonide (OMNARIS) 50 MCG/ACT nasal spray One or two sprays  each nostril once a day      . levocetirizine (XYZAL) 5 MG tablet Take 5 mg by mouth every evening.      . montelukast (SINGULAIR) 10 MG tablet Take 10 mg by mouth at bedtime.      . niacin (NIASPAN) 500 MG CR tablet Take 500 mg by mouth at bedtime.      . Omega-3 Fatty Acids (FISH OIL) 500 MG CAPS Take one by mouth twice a day      . zolpidem (AMBIEN) 10 MG tablet Take 10 mg by mouth at bedtime as needed.       The PMH, PSH, Social History, Family History, Medications, and allergies have been reviewed in [facility], and have been updated if relevant.  OBJECTIVE:  BP 150/102  Pulse 72  Temp(Src) 98.5 F (36.9 C) (Oral)  Wt 135 lb (61.236 kg)  Appears well, in no apparent distress.  Vital signs are normal. The abdomen is soft without tenderness, guarding, mass, rebound or organomegaly. No CVA tenderness or inguinal adenopathy noted. Urine dipstick shows negative for all components.  Micro exam: not done.   ASSESSMENT/PLAN: 1.  Dysuria- Although UA neg, symptoms are classic for UTI. Will treat with cipro 500 mg twice daily x 5 days and send urine for cx. If symptoms do  not improve and/or urine cx neg, I am concerned this may be due to her ALS progressing and will have to refer back to neuro. The patient indicates understanding of these issues and agrees with the plan.

## 2011-09-28 NOTE — Patient Instructions (Signed)
Good to see you. Your symptoms do seem consistent with a urinary tract infection although your urine did not show any bacteria. Let's go ahead and take cipro as directed- 1 tablet twice daily for 5 days. If your symptoms continue, please let me know.

## 2011-09-28 NOTE — Progress Notes (Signed)
Addended by: Alvina Chou on: 09/28/2011 02:28 PM   Modules accepted: Orders

## 2011-09-30 LAB — URINE CULTURE: Colony Count: 30000

## 2011-11-02 ENCOUNTER — Ambulatory Visit (INDEPENDENT_AMBULATORY_CARE_PROVIDER_SITE_OTHER): Payer: BC Managed Care – PPO | Admitting: Family Medicine

## 2011-11-02 ENCOUNTER — Encounter: Payer: Self-pay | Admitting: Family Medicine

## 2011-11-02 VITALS — BP 140/100 | HR 72 | Temp 97.9°F

## 2011-11-02 DIAGNOSIS — M62838 Other muscle spasm: Secondary | ICD-10-CM

## 2011-11-02 DIAGNOSIS — G1221 Amyotrophic lateral sclerosis: Secondary | ICD-10-CM

## 2011-11-02 MED ORDER — ALPRAZOLAM 0.5 MG PO TABS: 0.2500 mg | ORAL_TABLET | Freq: Three times a day (TID) | ORAL | Status: AC | PRN

## 2011-11-02 MED ORDER — ZOLPIDEM TARTRATE ER 12.5 MG PO TBCR: 12.5000 mg | EXTENDED_RELEASE_TABLET | Freq: Every evening | ORAL | Status: DC | PRN

## 2011-11-02 MED ORDER — BACLOFEN 10 MG PO TABS: 5.0000 mg | ORAL_TABLET | Freq: Three times a day (TID) | ORAL | Status: AC

## 2011-11-02 NOTE — Patient Instructions (Signed)
Good to see you. Please take Baclofen as directed. Please still keep appointment with your neurologist at [facility]. Call me in a few days with an update.

## 2011-11-02 NOTE — Progress Notes (Signed)
Subjective:    Patient ID: Jennifer [name], female    DOB: 03/10/1961, 51 y.o.   MRN: [id number]  HPI  40 with h/o long standing pseudobulbular palsy/ALS followed by Dr. Zannie Kehr at [facility] here for progressive right leg stiffness/spasms.  She has had issues with speech for years now but this is the first time that she has had any symptoms in her limbs.  This is making her obviously very concerned. She notices more when she is tired- past several months, her right leg spasms, tightens up and sometimes "gives out."  She has fallen multiple times when it occurs.  Patient Active Problem List  Diagnoses  . HYPERLIPIDEMIA  . TOBACCO ABUSE  . ADJUSTMENT DISORDER WITH MIXED FEATURES  . DEPRESSION  . SINUSITIS- ACUTE-NOS  . ACUTE BRONCHIOLITIS DUE OTH INFECTIOUS ORGANISMS  . BACK PAIN, LUMBAR  . DYSARTHRIA  . OTHER DYSPHAGIA  . ELEVATED BLOOD PRESSURE WITHOUT DIAGNOSIS OF HYPERTENSION  . LUMBAR STRAIN  . ALS (amyotrophic lateral sclerosis)  . Muscle spasm of right leg   No past medical history on file. Past Surgical History  Procedure Date  . Cesarean section    History  Substance Use Topics  . Smoking status: Former Games developer  . Smokeless tobacco: Not on file  . Alcohol Use: Not on file   Family History  Problem Relation Age of Onset  . COPD Mother   . Thyroid disease Mother   . Diverticulosis Mother   . Heart attack Mother   . Gout Father   . Depression Sister   . Thyroid disease Sister   . Cancer Brother     kidney  . Heart attack Maternal Grandmother   . Heart attack Maternal Grandfather   . Heart attack Paternal Grandfather   . Hyperlipidemia Sister   . Evelene Croon Parkinson White syndrome Sister 25  . Depression Brother   . Hypertension Brother    No Known Allergies Current Outpatient Prescriptions on File Prior to Visit  Medication Sig Dispense Refill  . buPROPion (WELLBUTRIN SR) 150 MG 12 hr tablet Take two tablets by mouth every morning for depression  60 tablet  3  .  ciclesonide (OMNARIS) 50 MCG/ACT nasal spray One or two sprays each nostril once a day      . dexlansoprazole (DEXILANT) 60 MG capsule Take 60 mg by mouth daily.      [name] levocetirizine (XYZAL) 5 MG tablet Take 5 mg by mouth every evening.      . loratadine (CLARITIN) 10 MG tablet Take one tablet by mouth daily as needed      . montelukast (SINGULAIR) 10 MG tablet Take 10 mg by mouth at bedtime.      . niacin (NIASPAN) 500 MG CR tablet Take 500 mg by mouth at bedtime.      . Omega-3 Fatty Acids (FISH OIL) 500 MG CAPS Take one by mouth twice a day      . ranitidine (ZANTAC) 150 MG capsule Take 150 mg by mouth daily.       The PMH, PSH, Social History, Family History, Medications, and allergies have been reviewed in [facility], and have been updated if relevant.   Review of Systems See HPI Pos for sleep disturbances and hoarse voice    Objective:   Physical Exam BP 140/100  Pulse 72  Temp(Src) 97.9 F (36.6 C) (Oral) Gen:  Alert, very pleasant, spastic dysarthria (baseline)- able to understand her as usual. Neuro: Motor:  She does have increased tone in her legs  bilaterally and has brisk reflexes in both, right greater than left- 2+ Gait is normal.     Assessment & Plan:   1. Muscle spasm of right leg    New and likely due to her longstanding illness and an upper motor neuron component. Advised following up with neuro immediately. For now, I will place her on low dose Baclofen although I advised this is not a solution and she still needs to see neurology. The patient indicates understanding of these issues and agrees with the plan.

## 2011-11-30 ENCOUNTER — Ambulatory Visit: Payer: Self-pay | Admitting: Orthopedic Surgery

## 2011-12-29 ENCOUNTER — Ambulatory Visit (INDEPENDENT_AMBULATORY_CARE_PROVIDER_SITE_OTHER): Payer: BC Managed Care – PPO | Admitting: Family Medicine

## 2011-12-29 ENCOUNTER — Other Ambulatory Visit: Payer: Self-pay | Admitting: *Deleted

## 2011-12-29 ENCOUNTER — Encounter: Payer: Self-pay | Admitting: Family Medicine

## 2011-12-29 VITALS — BP 150/102 | HR 64 | Temp 98.6°F | Wt 136.0 lb

## 2011-12-29 DIAGNOSIS — J329 Chronic sinusitis, unspecified: Secondary | ICD-10-CM | POA: Insufficient documentation

## 2011-12-29 DIAGNOSIS — R030 Elevated blood-pressure reading, without diagnosis of hypertension: Secondary | ICD-10-CM

## 2011-12-29 MED ORDER — ALPRAZOLAM 0.25 MG PO TABS: 0.2500 mg | ORAL_TABLET | Freq: Every evening | ORAL | Status: DC | PRN

## 2011-12-29 MED ORDER — BUPROPION HCL ER (SR) 150 MG PO TB12: ORAL_TABLET | ORAL | Status: DC

## 2011-12-29 MED ORDER — DEXAMETHASONE SOD PHOSPHATE PF 10 MG/ML IJ SOLN
10.0000 mg | Freq: Once | INTRAMUSCULAR | Status: AC
  Administered 2011-12-29: 10 mg via INTRAMUSCULAR

## 2011-12-29 MED ORDER — FLUTICASONE PROPIONATE 50 MCG/ACT NA SUSP: 2.0000 | Freq: Every day | NASAL | Status: DC

## 2011-12-29 NOTE — Progress Notes (Signed)
Addended by: Eliezer Bottom on: 12/29/2011 11:24 AM   Modules accepted: Orders

## 2011-12-29 NOTE — Progress Notes (Signed)
Addended by: Eliezer Bottom on: 12/29/2011 11:08 AM   Modules accepted: Orders

## 2011-12-29 NOTE — Progress Notes (Signed)
Subjective:    Patient ID: Jennifer [name], female    DOB: 1961-05-21, 51 y.o.   MRN: [id number]  HPI  63 with h/o long standing pseudobulbular palsy/ALS followed by Dr. Zannie Kehr at [facility] here for persistent sinus symptoms.  Ongoing issues with her sinuses for several years.  She has had sinus surgery.  H/o allergy shots in past. On claritin. Singulair gave her constipation.  Has constant sinus pressure. No fevers. When she is congested, makes her swallowing more difficult- has ALS.  BP elevated- has h/o white coat HTN.  At home, usually runs in 120s/80s.  Patient Active Problem List  Diagnoses  . HYPERLIPIDEMIA  . TOBACCO ABUSE  . ADJUSTMENT DISORDER WITH MIXED FEATURES  . DEPRESSION  . SINUSITIS- ACUTE-NOS  . ACUTE BRONCHIOLITIS DUE OTH INFECTIOUS ORGANISMS  . BACK PAIN, LUMBAR  . DYSARTHRIA  . OTHER DYSPHAGIA  . ELEVATED BLOOD PRESSURE WITHOUT DIAGNOSIS OF HYPERTENSION  . LUMBAR STRAIN  . ALS (amyotrophic lateral sclerosis)  . Muscle spasm of right leg   No past medical history on file. Past Surgical History  Procedure Date  . Cesarean section    History  Substance Use Topics  . Smoking status: Former Games developer  . Smokeless tobacco: Not on file  . Alcohol Use: Not on file   Family History  Problem Relation Age of Onset  . COPD Mother   . Thyroid disease Mother   . Diverticulosis Mother   . Heart attack Mother   . Gout Father   . Depression Sister   . Thyroid disease Sister   . Cancer Brother     kidney  . Heart attack Maternal Grandmother   . Heart attack Maternal Grandfather   . Heart attack Paternal Grandfather   . Hyperlipidemia Sister   . Evelene Croon Parkinson White syndrome Sister 25  . Depression Brother   . Hypertension Brother    No Known Allergies Current Outpatient Prescriptions on File Prior to Visit  Medication Sig Dispense Refill  . ciclesonide (OMNARIS) 50 MCG/ACT nasal spray One or two sprays each nostril once a day      . dexlansoprazole  (DEXILANT) 60 MG capsule Take 60 mg by mouth daily.      [name] levocetirizine (XYZAL) 5 MG tablet Take 5 mg by mouth every evening.      . loratadine (CLARITIN) 10 MG tablet Take one tablet by mouth daily as needed      . montelukast (SINGULAIR) 10 MG tablet Take 10 mg by mouth at bedtime.      . niacin (NIASPAN) 500 MG CR tablet Take 500 mg by mouth at bedtime.      . Omega-3 Fatty Acids (FISH OIL) 500 MG CAPS Take one by mouth twice a day      . ranitidine (ZANTAC) 150 MG capsule Take 150 mg by mouth daily.      [name] DISCONTD: buPROPion (WELLBUTRIN SR) 150 MG 12 hr tablet Take two tablets by mouth every morning for depression  60 tablet  3   The PMH, PSH, Social History, Family History, Medications, and allergies have been reviewed in [facility], and have been updated if relevant.   Review of Systems See HPI Pos for sleep disturbances and hoarse voice    Objective:   Physical Exam BP 150/102  Pulse 64  Temp 98.6 F (37 C)  Wt 136 lb (61.689 kg) BP Readings from Last 3 Encounters:  12/29/11 150/102  11/02/11 140/100  09/28/11 150/102    Gen:  Alert, very  pleasant, spastic dysarthria (baseline)- able to understand her as usual. HEENT:  Pos nasal mucosal erythema, non tender to palp Neuro: Motor:  She does have increased tone in her legs bilaterally and has brisk reflexes in both, right greater than left- 2+ Gait is normal.     Assessment & Plan:   1. ELEVATED BLOOD PRESSURE WITHOUT DIAGNOSIS OF HYPERTENSION  Unchanged-advised to keep log at home, she will let me know if it becomes elevated at home.  2. Chronic sinusitis  Deteriorated. Given h/o ALS- will give IM decadron in office today to help with inflammation/swallowing. Restart flonase. The patient indicates understanding of these issues and agrees with the plan.

## 2011-12-29 NOTE — Patient Instructions (Signed)
Please restart flonase (prescription was sent to your pharmacy). Call us later this week with an update of your symptoms. Continue taking claritin.

## 2012-01-01 ENCOUNTER — Other Ambulatory Visit: Payer: Self-pay

## 2012-01-01 NOTE — Telephone Encounter (Signed)
Advised patient's husband, chart updated.

## 2012-01-01 NOTE — Telephone Encounter (Signed)
Unfortunately, since she picked up the other prescription, I would prefer for her to take two 0.25 mg tablets at night as needed (this will equal 0.5 mg). Once she runs out of that prescription, we can send in the higher dose. Please update dose in Epic. Thank you.

## 2012-01-01 NOTE — Telephone Encounter (Signed)
pts husband request refill alprazolam 0.5 mg. On 11/02/11 visit Alprazolam 0.5 mg given but on 12/29/11 visit Alprazolam 0.25 mg given. Pt has already picked up 0.25 mg rx given on 12/29/11 but wants Alprazolam 0.5 mg [facility].Please advise.

## 2012-01-11 ENCOUNTER — Other Ambulatory Visit: Payer: Self-pay | Admitting: *Deleted

## 2012-01-11 MED ORDER — ALPRAZOLAM 0.5 MG PO TABS: 0.5000 mg | ORAL_TABLET | Freq: Every evening | ORAL | Status: DC | PRN

## 2012-01-11 MED ORDER — BACLOFEN 10 MG PO TABS: 10.0000 mg | ORAL_TABLET | Freq: Three times a day (TID) | ORAL | Status: AC

## 2012-01-11 NOTE — Telephone Encounter (Signed)
Baclofen sent. Please phone in rx for xanax as entered below.

## 2012-01-11 NOTE — Telephone Encounter (Signed)
Patient's husband called and said patient said that the 1/2 baclofen TID wasn't much help (not on med list). She was asking about increasing it to a whole pill TID to see if that would provide more help. Also, she will need refill on xanax. 0.25 mg was called in last time and she takes 0.5 mg, so she has run out early.

## 2012-01-13 NOTE — Telephone Encounter (Signed)
[facility] spoke with [facility] as instructed. Patient's husband notified as instructed by telephone med sent and [facility] [place].

## 2012-02-09 DIAGNOSIS — F482 Pseudobulbar affect: Secondary | ICD-10-CM | POA: Insufficient documentation

## 2012-02-29 ENCOUNTER — Other Ambulatory Visit: Payer: Self-pay

## 2012-02-29 NOTE — Telephone Encounter (Signed)
pts husband request refill xanax to [facility] [place].Please advise.

## 2012-03-01 MED ORDER — ALPRAZOLAM 0.5 MG PO TABS: 0.5000 mg | ORAL_TABLET | Freq: Every evening | ORAL | Status: DC | PRN

## 2012-03-01 NOTE — Telephone Encounter (Signed)
Medicine called to pharmacy. 

## 2012-03-30 ENCOUNTER — Telehealth: Payer: Self-pay

## 2012-03-30 MED ORDER — ALPRAZOLAM 0.5 MG PO TABS: ORAL_TABLET | ORAL | Status: DC

## 2012-03-30 NOTE — Telephone Encounter (Signed)
Medicine called to pharmacy, husband advised.

## 2012-03-30 NOTE — Telephone Encounter (Signed)
pts husband left v/m requesting alprazolam 0.5 mg  Instructions increased to three times daily due to helping control pts emotions due to ALS. If pt has to be seen will schedule appt; difficult for pt to walk. [facility].

## 2012-03-30 NOTE — Telephone Encounter (Signed)
Ok to increase as requested, please refill 1 month supply no refills.

## 2012-04-07 ENCOUNTER — Ambulatory Visit (INDEPENDENT_AMBULATORY_CARE_PROVIDER_SITE_OTHER): Payer: BC Managed Care – PPO | Admitting: Family Medicine

## 2012-04-07 ENCOUNTER — Encounter: Payer: Self-pay | Admitting: Family Medicine

## 2012-04-07 VITALS — BP 150/90 | HR 76 | Temp 97.7°F | Wt 132.0 lb

## 2012-04-07 DIAGNOSIS — J019 Acute sinusitis, unspecified: Secondary | ICD-10-CM

## 2012-04-07 MED ORDER — AMOXICILLIN-POT CLAVULANATE 875-125 MG PO TABS: 1.0000 | ORAL_TABLET | Freq: Two times a day (BID) | ORAL | Status: AC

## 2012-04-07 NOTE — Progress Notes (Signed)
SUBJECTIVE:  Jennifer [name] is a 51 y.o. female with h/o ALS who complains of coryza, congestion and bilateral sinus pain for 14 days. She denies a history of anorexia, chest pain and chills and denies a history of asthma. Patient denies smoke cigarettes.   Patient Active Problem List  Diagnosis  . HYPERLIPIDEMIA  . TOBACCO ABUSE  . ADJUSTMENT DISORDER WITH MIXED FEATURES  . DEPRESSION  . SINUSITIS- ACUTE-NOS  . ACUTE BRONCHIOLITIS DUE OTH INFECTIOUS ORGANISMS  . BACK PAIN, LUMBAR  . DYSARTHRIA  . OTHER DYSPHAGIA  . ELEVATED BLOOD PRESSURE WITHOUT DIAGNOSIS OF HYPERTENSION  . LUMBAR STRAIN  . ALS (amyotrophic lateral sclerosis)  . Muscle spasm of right leg  . Chronic sinusitis   No past medical history on file. Past Surgical History  Procedure Date  . Cesarean section    History  Substance Use Topics  . Smoking status: Former Games developer  . Smokeless tobacco: Not on file  . Alcohol Use: Not on file   Family History  Problem Relation Age of Onset  . COPD Mother   . Thyroid disease Mother   . Diverticulosis Mother   . Heart attack Mother   . Gout Father   . Depression Sister   . Thyroid disease Sister   . Cancer Brother     kidney  . Heart attack Maternal Grandmother   . Heart attack Maternal Grandfather   . Heart attack Paternal Grandfather   . Hyperlipidemia Sister   . Evelene Croon Parkinson White syndrome Sister 25  . Depression Brother   . Hypertension Brother    No Known Allergies Current Outpatient Prescriptions on File Prior to Visit  Medication Sig Dispense Refill  . ALPRAZolam (XANAX) 0.5 MG tablet Take one by mouth three times daily.  90 tablet  0  . buPROPion (WELLBUTRIN SR) 150 MG 12 hr tablet Take two tablets by mouth every morning for depression  60 tablet  5  . dexlansoprazole (DEXILANT) 60 MG capsule Take 60 mg by mouth daily.      . fluticasone (FLONASE) 50 MCG/ACT nasal spray Place 2 sprays into the nose daily.  16 g  6  . levocetirizine (XYZAL) 5 MG  tablet Take 5 mg by mouth every evening.      . loratadine (CLARITIN) 10 MG tablet Take one tablet by mouth daily as needed      . niacin (NIASPAN) 500 MG CR tablet Take 500 mg by mouth at bedtime.      . ranitidine (ZANTAC) 150 MG capsule Take 150 mg by mouth daily.       The PMH, PSH, Social History, Family History, Medications, and allergies have been reviewed in [facility], and have been updated if relevant.  OBJECTIVE: BP 150/90  Pulse 76  Temp 97.7 F (36.5 C)  Wt 132 lb (59.875 kg)  She appears well,baseline dysphagia vital signs are as noted. Ears normal.  Throat and pharynx normal.  Neck supple. No adenopathy in the neck. Nose is congested. Sinuses non tender. The chest is clear, without wheezes or rales.  ASSESSMENT:  sinusitis  PLAN: Given duration and progression of symptoms, will treat for bacterial sinusitis. Symptomatic therapy suggested: push fluids, rest and return office visit prn if symptoms persist or worsen.  Call or return to clinic prn if these symptoms worsen or fail to improve as anticipated.

## 2012-04-07 NOTE — Patient Instructions (Addendum)
Take antibiotic as directed- Augmentin 1 tablet twice daily x 10 days.  Drink lots of fluids.  Keep taking your flonase.  Treat sympotmatically with Mucinex, nasal saline irrigation, and Tylenol/Ibuprofen. . You can use warm compresses.  Cough suppressant at night. Call if not improving as expected in 5-7 days.

## 2012-04-12 ENCOUNTER — Telehealth: Payer: Self-pay | Admitting: Family Medicine

## 2012-04-12 ENCOUNTER — Telehealth: Payer: Self-pay

## 2012-04-12 MED ORDER — FLUCONAZOLE 150 MG PO TABS: 150.0000 mg | ORAL_TABLET | Freq: Once | ORAL | Status: DC

## 2012-04-12 NOTE — Telephone Encounter (Signed)
Advised patient's husband that script has been sent.

## 2012-04-12 NOTE — Telephone Encounter (Signed)
Donald pts husband request Diflucan to [facility](not on med list); Dorinda Hill said pt understood if needed Dr Dayton Martes would send to pharmacy. Symptoms are same as before; Dorinda Hill is not sure if vaginal discharge or itching or irritation.Please advise.

## 2012-04-12 NOTE — Telephone Encounter (Signed)
Rx for diflucan sent

## 2012-04-12 NOTE — Telephone Encounter (Signed)
Pharmacist advised.   LMOVM of patient's home and mobile phone.  I don't know what strength of Nuedexta that the patient is taking so I was not able to add it to the meds list.  Note sent to Jacki Cones, CMA to get more details for documentation of medication.

## 2012-04-12 NOTE — Telephone Encounter (Signed)
Per Jacki Cones CMA.. Pt taking nuedexta... Dextromethorphan and quinidine. Antifungals have interaction with quidinine component.. Level X. Add nuedexta to med list please.  Have pt NOT take diflucan and instead use OTC topical vaginitis medication as this will cause less of interaction. Further recommendations from Dr. Dayton Martes upon er return.

## 2012-04-12 NOTE — Telephone Encounter (Signed)
[facility] Dif[facility]an could have serious reaction with one of her meds, could cause heat attack.   I told her to hold med until she heard from office

## 2012-04-13 NOTE — Telephone Encounter (Signed)
Spoke with patient's husband, he has not yet spoken to pharmacist.  Advised as below, pt to use otc medication instead of diflucan.  He will call back with strength of nuedexta.

## 2012-05-13 ENCOUNTER — Other Ambulatory Visit: Payer: Self-pay | Admitting: *Deleted

## 2012-05-13 MED ORDER — ALPRAZOLAM 0.5 MG PO TABS: ORAL_TABLET | ORAL | Status: DC

## 2012-05-13 NOTE — Telephone Encounter (Signed)
[facility] 

## 2012-05-18 ENCOUNTER — Telehealth: Payer: Self-pay

## 2012-05-18 NOTE — Telephone Encounter (Signed)
Pt was seen at [facility]; was going to have respiratory test but was having problem with equipment and advised pt to have PCP schedule the test. Pts husband said the respiratory test is to tell pts strength on blowing and sucking air. Dx code given 335.2. Pt has ALS. Please advise.

## 2012-05-18 NOTE — Telephone Encounter (Signed)
Given her history of ALS, probably best to refer to pulmonary but we could try to do spirometry here first.

## 2012-05-23 ENCOUNTER — Ambulatory Visit (INDEPENDENT_AMBULATORY_CARE_PROVIDER_SITE_OTHER): Payer: BC Managed Care – PPO | Admitting: Family Medicine

## 2012-05-23 ENCOUNTER — Encounter: Payer: Self-pay | Admitting: Family Medicine

## 2012-05-23 VITALS — BP 140/90 | HR 76 | Temp 98.1°F | Wt 135.0 lb

## 2012-05-23 DIAGNOSIS — Z23 Encounter for immunization: Secondary | ICD-10-CM

## 2012-05-23 DIAGNOSIS — G1221 Amyotrophic lateral sclerosis: Secondary | ICD-10-CM | POA: Insufficient documentation

## 2012-05-23 NOTE — Addendum Note (Signed)
Addended by: Sydell Axon C on: 05/23/2012 01:34 PM   Modules accepted: Orders

## 2012-05-23 NOTE — Progress Notes (Signed)
Subjective:    Patient ID: Jennifer [name], female    DOB: Jan 21, 1961, 51 y.o.   MRN: [id number]  HPI  75 with h/o long standing ALS followed by Dr. Zannie Kehr at [facility] here for spirometry.  Per pt, machine was not working at [employer] [employer] [employer] so they ask that we do PFTs here.    Patient Active Problem List  Diagnosis  . HYPERLIPIDEMIA  . TOBACCO ABUSE  . ADJUSTMENT DISORDER WITH MIXED FEATURES  . DEPRESSION  . SINUSITIS- ACUTE-NOS  . ACUTE BRONCHIOLITIS DUE OTH INFECTIOUS ORGANISMS  . BACK PAIN, LUMBAR  . DYSARTHRIA  . OTHER DYSPHAGIA  . ELEVATED BLOOD PRESSURE WITHOUT DIAGNOSIS OF HYPERTENSION  . LUMBAR STRAIN  . ALS (amyotrophic lateral sclerosis)  . Muscle spasm of right leg  . Chronic sinusitis  . Amyotrophic lateral sclerosis   No past medical history on file. Past Surgical History  Procedure Date  . Cesarean section    History  Substance Use Topics  . Smoking status: Former Games developer  . Smokeless tobacco: Not on file  . Alcohol Use: Not on file   Family History  Problem Relation Age of Onset  . COPD Mother   . Thyroid disease Mother   . Diverticulosis Mother   . Heart attack Mother   . Gout Father   . Depression Sister   . Thyroid disease Sister   . Cancer Brother     kidney  . Heart attack Maternal Grandmother   . Heart attack Maternal Grandfather   . Heart attack Paternal Grandfather   . Hyperlipidemia Sister   . Evelene Croon Parkinson White syndrome Sister 25  . Depression Brother   . Hypertension Brother    No Known Allergies Current Outpatient Prescriptions on File Prior to Visit  Medication Sig Dispense Refill  . ALPRAZolam (XANAX) 0.5 MG tablet Take one by mouth three times daily.  90 tablet  0  . buPROPion (WELLBUTRIN SR) 150 MG 12 hr tablet Take two tablets by mouth every morning for depression  60 tablet  5  . dexlansoprazole (DEXILANT) 60 MG capsule Take 60 mg by mouth daily.      . fluconazole (DIFLUCAN) 150 MG tablet Take 1 tablet (150 mg total) by mouth  once.  1 tablet  0  . fluticasone (FLONASE) 50 MCG/ACT nasal spray Place 2 sprays into the nose daily.  16 g  6  . levocetirizine (XYZAL) 5 MG tablet Take 5 mg by mouth every evening.      . loratadine (CLARITIN) 10 MG tablet Take one tablet by mouth daily as needed      . niacin (NIASPAN) 500 MG CR tablet Take 500 mg by mouth at bedtime.      . ranitidine (ZANTAC) 150 MG capsule Take 150 mg by mouth daily.       The PMH, PSH, Social History, Family History, Medications, and allergies have been reviewed in [facility], and have been updated if relevant.   Review of Systems See HPI    Objective:   Physical Exam BP 140/90  Pulse 76  Temp 98.1 F (36.7 C)  Wt 135 lb (61.236 kg) BP Readings from Last 3 Encounters:  05/23/12 140/90  04/07/12 150/90  12/29/11 150/102    Gen:  Alert, very pleasant, spastic dysarthria (baseline)- able to understand her as usual.     Assessment & Plan:   1. Amyotrophic lateral sclerosis  Spirometry with graph, Spirometry with graph, Spirometry with graph, Spirometry with graph  Spirometry performed (see results). Will fax  to her physician at [facility].

## 2012-06-06 NOTE — Telephone Encounter (Signed)
Can this encounter be closed?

## 2012-06-06 NOTE — Telephone Encounter (Signed)
Yes

## 2012-06-16 ENCOUNTER — Other Ambulatory Visit: Payer: Self-pay | Admitting: *Deleted

## 2012-06-16 MED ORDER — ALPRAZOLAM 0.5 MG PO TABS: ORAL_TABLET | ORAL | Status: DC

## 2012-06-16 NOTE — Telephone Encounter (Signed)
Faxed refill request from [facility], last filled 05/13/12.

## 2012-06-16 NOTE — Telephone Encounter (Signed)
[facility]

## 2012-07-05 ENCOUNTER — Other Ambulatory Visit: Payer: Self-pay | Admitting: *Deleted

## 2012-07-05 MED ORDER — BUPROPION HCL ER (SR) 150 MG PO TB12: ORAL_TABLET | ORAL | Status: DC

## 2012-08-01 ENCOUNTER — Telehealth: Payer: Self-pay

## 2012-08-01 MED ORDER — BUPROPION HCL ER (XL) 450 MG PO TB24: 150.0000 mg | ORAL_TABLET | Freq: Every day | ORAL | Status: DC

## 2012-08-01 NOTE — Telephone Encounter (Signed)
pts husband left v/m requesting increase bupropion to 450 mg daily due to increased depression [facility]; pt crying more.Does pt need to come in for appt?Please advise.

## 2012-08-01 NOTE — Telephone Encounter (Signed)
Rx increased.  Please call us in 2 weeks with an update, sooner if she worsens.

## 2012-08-01 NOTE — Telephone Encounter (Signed)
Advised patient's husband as instructed.

## 2012-08-02 ENCOUNTER — Other Ambulatory Visit: Payer: Self-pay | Admitting: *Deleted

## 2012-08-02 MED ORDER — ALPRAZOLAM 0.5 MG PO TABS: ORAL_TABLET | ORAL | Status: DC

## 2012-08-02 NOTE — Telephone Encounter (Signed)
Faxed request from [facility], last filled 06/16/12.

## 2012-08-02 NOTE — Telephone Encounter (Signed)
[facility] 

## 2012-08-29 ENCOUNTER — Telehealth: Payer: Self-pay

## 2012-08-29 NOTE — Telephone Encounter (Signed)
Pt's husband left v/m that the med for depression that Dr Dayton Martes increased is working very well and just wanted Dr Dayton Martes to know. No contact # left.

## 2012-08-29 NOTE — Telephone Encounter (Signed)
Thank you for the update!

## 2012-09-27 ENCOUNTER — Other Ambulatory Visit: Payer: Self-pay | Admitting: *Deleted

## 2012-09-27 MED ORDER — ALPRAZOLAM 0.5 MG PO TABS: ORAL_TABLET | ORAL | Status: DC

## 2012-09-27 NOTE — Telephone Encounter (Signed)
Faxed refill [facility], last filled 08/02/12.

## 2012-09-27 NOTE — Telephone Encounter (Signed)
[facility] 

## 2012-10-06 ENCOUNTER — Other Ambulatory Visit: Payer: Self-pay | Admitting: Family Medicine

## 2012-10-30 ENCOUNTER — Other Ambulatory Visit: Payer: Self-pay | Admitting: Family Medicine

## 2012-10-31 NOTE — Telephone Encounter (Signed)
Last filled 07/05/12.

## 2012-11-15 ENCOUNTER — Other Ambulatory Visit: Payer: Self-pay | Admitting: *Deleted

## 2012-11-15 MED ORDER — ALPRAZOLAM 0.5 MG PO TABS: ORAL_TABLET | ORAL | Status: DC

## 2012-11-15 NOTE — Telephone Encounter (Signed)
[facility] 

## 2012-11-15 NOTE — Telephone Encounter (Signed)
Last filled 09/27/12

## 2012-11-24 ENCOUNTER — Ambulatory Visit (INDEPENDENT_AMBULATORY_CARE_PROVIDER_SITE_OTHER): Payer: BC Managed Care – PPO | Admitting: Family Medicine

## 2012-11-24 ENCOUNTER — Encounter: Payer: Self-pay | Admitting: Family Medicine

## 2012-11-24 VITALS — BP 150/90 | HR 76 | Temp 97.8°F | Wt 130.0 lb

## 2012-11-24 DIAGNOSIS — J069 Acute upper respiratory infection, unspecified: Secondary | ICD-10-CM

## 2012-11-24 MED ORDER — BUPROPION HCL ER (SR) 150 MG PO TB12: 150.0000 mg | ORAL_TABLET | Freq: Three times a day (TID) | ORAL | Status: DC

## 2012-11-24 MED ORDER — AMOXICILLIN 875 MG PO TABS: 875.0000 mg | ORAL_TABLET | Freq: Two times a day (BID) | ORAL | Status: DC

## 2012-11-24 MED ORDER — FLUTICASONE PROPIONATE 50 MCG/ACT NA SUSP: NASAL | Status: DC

## 2012-11-24 NOTE — Progress Notes (Signed)
SUBJECTIVE:  Jennifer [name] is a 52 y.o. female who complains of coryza, congestion and both sides sinus pain for 8 days. She denies a history of anorexia and chest pain and denies a history of asthma. Patient denies smoke cigarettes.   Patient Active Problem List   Diagnosis Date Noted  . Amyotrophic lateral sclerosis 05/23/2012  . Chronic sinusitis 12/29/2011  . Muscle spasm of right leg 11/02/2011  . ALS (amyotrophic lateral sclerosis) 09/28/2011  . 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 DYSARTHRIA 07/04/2010  . OTHER DYSPHAGIA 12/03/2009  . DEPRESSION 11/07/2008  . SINUSITIS- ACUTE-NOS 11/07/2008  . BACK PAIN, LUMBAR 08/24/2007  . HYPERLIPIDEMIA 08/03/2007  . TOBACCO ABUSE 08/03/2007  . ACUTE BRONCHIOLITIS DUE OTH INFECTIOUS ORGANISMS 08/03/2007  . ELEVATED BLOOD PRESSURE WITHOUT DIAGNOSIS OF HYPERTENSION 08/03/2007  . LUMBAR STRAIN 08/03/2007   No past medical history on file. Past Surgical History  Procedure Laterality Date  . Cesarean section     History  Substance Use Topics  . Smoking status: Former Games developer  . Smokeless tobacco: Not on file  . Alcohol Use: Not on file   Family History  Problem Relation Age of Onset  . COPD Mother   . Thyroid disease Mother   . Diverticulosis Mother   . Heart attack Mother   . Gout Father   . Depression Sister   . Thyroid disease Sister   . Cancer Brother     kidney  . Heart attack Maternal Grandmother   . Heart attack Maternal Grandfather   . Heart attack Paternal Grandfather   . Hyperlipidemia Sister   . Evelene Croon Parkinson White syndrome Sister 25  . Depression Brother   . Hypertension Brother    No Known Allergies Current Outpatient Prescriptions on File Prior to Visit  Medication Sig Dispense Refill  . ALPRAZolam (XANAX) 0.5 MG tablet Take one by mouth three times daily.  90 tablet  0  . buPROPion (WELLBUTRIN SR) 150 MG 12 hr tablet TAKE 2 TABLETS BY MOUTH EVERY MORNING FOR DEPRESSION  60 tablet  3  .  buPROPion 450 MG TB24 Take 150 mg by mouth daily.  30 tablet  6  . dexlansoprazole (DEXILANT) 60 MG capsule Take 60 mg by mouth daily.      . fluticasone (FLONASE) 50 MCG/ACT nasal spray PLACE 2 SPRAYS IN EACH NOSTRIL DAILY  16 g  5  . levocetirizine (XYZAL) 5 MG tablet Take 5 mg by mouth every evening.      . loratadine (CLARITIN) 10 MG tablet Take one tablet by mouth daily as needed      . niacin (NIASPAN) 500 MG CR tablet Take 500 mg by mouth at bedtime.      . ranitidine (ZANTAC) 150 MG capsule Take 150 mg by mouth daily.       No current facility-administered medications on file prior to visit.   The PMH, PSH, Social History, Family History, Medications, and allergies have been reviewed in [facility], and have been updated if relevant.  OBJECTIVE: BP 150/90  Pulse 76  Temp(Src) 97.8 F (36.6 C)  Wt 130 lb (58.968 kg)  BMI 26.24 kg/m2  She appears well, vital signs are as noted. Ears normal.  Throat and pharynx normal.  Neck supple. No adenopathy in the neck. Nose is congested. Sinuses tender, left greater than right. The chest is clear, without wheezes or rales.  ASSESSMENT:  sinusitis  PLAN: Given duration and progression of symptoms, will treat for bacterial sinusitis with amoxicillin and flonase.  Symptomatic therapy suggested: push fluids, rest and return office visit prn if symptoms persist or worsen.Call or return to clinic prn if these symptoms worsen or fail to improve as anticipated.

## 2012-11-24 NOTE — Addendum Note (Signed)
Addended by: [name] on: 11/24/2012 12:19 PM   Modules accepted: Orders

## 2012-11-24 NOTE — Patient Instructions (Addendum)
Take Amoxicillin as directed- 1 tablet twice daily x 10.  Drink lots of fluids.    Treat sympotmatically with Mucinex, nasal saline irrigation, and Tylenol/Ibuprofen.  Flonase as directed.  You can use warm compresses.  Cough suppressant at night.

## 2012-12-06 ENCOUNTER — Telehealth: Payer: Self-pay

## 2012-12-06 MED ORDER — AZITHROMYCIN 250 MG PO TABS: ORAL_TABLET | ORAL | Status: DC

## 2012-12-06 NOTE — Telephone Encounter (Signed)
Left message with daughter that ZPack was sent in to pharmacy.

## 2012-12-06 NOTE — Telephone Encounter (Signed)
pts husband said pt seen 11/24/12; pt took amoxicillin but "it was hard on her stomach."  Pt not having facial pain but pt having swelling in area of sinus. Pt can smell the infection in her nose and request a Z pack sent to [facility].No H/A,facial pain or fever.Please advise.

## 2012-12-06 NOTE — Telephone Encounter (Signed)
Rx sent 

## 2012-12-12 ENCOUNTER — Telehealth: Payer: Self-pay

## 2012-12-12 MED ORDER — FLUCONAZOLE 150 MG PO TABS: 150.0000 mg | ORAL_TABLET | Freq: Once | ORAL | Status: DC

## 2012-12-12 NOTE — Telephone Encounter (Signed)
Patient's husband advised 

## 2012-12-12 NOTE — Telephone Encounter (Signed)
pts husband said pt recently took antibiotic and now vaginal itching but no discharge and frequency of urine. Pt request Diflucan to [facility]. Mr Seifried request call back.

## 2012-12-12 NOTE — Telephone Encounter (Signed)
Rx sent 

## 2012-12-22 ENCOUNTER — Other Ambulatory Visit: Payer: Self-pay | Admitting: Family Medicine

## 2012-12-22 NOTE — Telephone Encounter (Signed)
[facility] 

## 2013-01-23 ENCOUNTER — Ambulatory Visit: Payer: BC Managed Care – PPO | Admitting: Physical Therapy

## 2013-01-24 ENCOUNTER — Ambulatory Visit: Payer: BC Managed Care – PPO | Admitting: Physical Therapy

## 2013-01-24 ENCOUNTER — Ambulatory Visit: Payer: BC Managed Care – PPO | Attending: Family Medicine | Admitting: Physical Therapy

## 2013-01-24 DIAGNOSIS — M6281 Muscle weakness (generalized): Secondary | ICD-10-CM | POA: Insufficient documentation

## 2013-01-24 DIAGNOSIS — R279 Unspecified lack of coordination: Secondary | ICD-10-CM | POA: Insufficient documentation

## 2013-01-24 DIAGNOSIS — R269 Unspecified abnormalities of gait and mobility: Secondary | ICD-10-CM | POA: Insufficient documentation

## 2013-01-24 DIAGNOSIS — IMO0001 Reserved for inherently not codable concepts without codable children: Secondary | ICD-10-CM | POA: Insufficient documentation

## 2013-01-25 ENCOUNTER — Ambulatory Visit: Payer: BC Managed Care – PPO | Admitting: Physical Therapy

## 2013-01-28 ENCOUNTER — Other Ambulatory Visit: Payer: Self-pay | Admitting: Family Medicine

## 2013-01-30 ENCOUNTER — Ambulatory Visit: Payer: BC Managed Care – PPO | Admitting: Physical Therapy

## 2013-01-30 NOTE — Telephone Encounter (Signed)
[facility] 

## 2013-02-01 ENCOUNTER — Ambulatory Visit: Payer: BC Managed Care – PPO | Admitting: Physical Therapy

## 2013-02-06 ENCOUNTER — Ambulatory Visit: Payer: BC Managed Care – PPO | Admitting: Physical Therapy

## 2013-02-08 ENCOUNTER — Ambulatory Visit: Payer: BC Managed Care – PPO | Admitting: Physical Therapy

## 2013-02-10 ENCOUNTER — Ambulatory Visit: Payer: BC Managed Care – PPO | Admitting: Physical Therapy

## 2013-02-13 ENCOUNTER — Ambulatory Visit: Payer: BC Managed Care – PPO | Admitting: Physical Therapy

## 2013-02-15 ENCOUNTER — Ambulatory Visit: Payer: BC Managed Care – PPO | Admitting: Physical Therapy

## 2013-02-20 ENCOUNTER — Ambulatory Visit: Payer: BC Managed Care – PPO | Admitting: Physical Therapy

## 2013-02-22 ENCOUNTER — Ambulatory Visit: Payer: BC Managed Care – PPO | Admitting: Physical Therapy

## 2013-02-22 ENCOUNTER — Telehealth: Payer: Self-pay | Admitting: *Deleted

## 2013-02-22 NOTE — Telephone Encounter (Signed)
Rx signed and in my box. 

## 2013-02-22 NOTE — Telephone Encounter (Signed)
Requesting order for youth rolling walker with fixed S wheels.  Order placed in inbox

## 2013-02-23 ENCOUNTER — Telehealth: Payer: Self-pay | Admitting: *Deleted

## 2013-02-23 NOTE — Telephone Encounter (Signed)
Order has been faxed back to [facility].

## 2013-02-23 NOTE — Telephone Encounter (Signed)
[facility] has faxed a form for rolater walker for patient.  Form signed and faxed back, form sent for scanning.

## 2013-02-27 ENCOUNTER — Ambulatory Visit: Payer: BC Managed Care – PPO | Attending: Family Medicine | Admitting: Physical Therapy

## 2013-02-27 DIAGNOSIS — IMO0001 Reserved for inherently not codable concepts without codable children: Secondary | ICD-10-CM | POA: Insufficient documentation

## 2013-02-27 DIAGNOSIS — R279 Unspecified lack of coordination: Secondary | ICD-10-CM | POA: Insufficient documentation

## 2013-02-27 DIAGNOSIS — M6281 Muscle weakness (generalized): Secondary | ICD-10-CM | POA: Insufficient documentation

## 2013-02-27 DIAGNOSIS — R269 Unspecified abnormalities of gait and mobility: Secondary | ICD-10-CM | POA: Insufficient documentation

## 2013-03-01 ENCOUNTER — Other Ambulatory Visit: Payer: Self-pay | Admitting: Family Medicine

## 2013-03-01 ENCOUNTER — Ambulatory Visit: Payer: BC Managed Care – PPO | Admitting: Physical Therapy

## 2013-03-01 NOTE — Telephone Encounter (Signed)
plz phone in. 

## 2013-03-02 NOTE — Telephone Encounter (Signed)
[facility] 

## 2013-03-06 ENCOUNTER — Ambulatory Visit: Payer: BC Managed Care – PPO | Admitting: Physical Therapy

## 2013-03-08 ENCOUNTER — Ambulatory Visit: Payer: BC Managed Care – PPO | Admitting: Physical Therapy

## 2013-03-13 ENCOUNTER — Ambulatory Visit: Payer: BC Managed Care – PPO | Admitting: Physical Therapy

## 2013-03-15 ENCOUNTER — Ambulatory Visit: Payer: BC Managed Care – PPO | Admitting: Physical Therapy

## 2013-03-17 ENCOUNTER — Ambulatory Visit: Payer: BC Managed Care – PPO | Admitting: Physical Therapy

## 2013-03-18 ENCOUNTER — Other Ambulatory Visit: Payer: Self-pay | Admitting: Family Medicine

## 2013-03-20 ENCOUNTER — Ambulatory Visit: Payer: BC Managed Care – PPO | Admitting: Physical Therapy

## 2013-03-22 ENCOUNTER — Ambulatory Visit: Payer: BC Managed Care – PPO | Admitting: Physical Therapy

## 2013-03-29 ENCOUNTER — Ambulatory Visit: Payer: BC Managed Care – PPO | Admitting: Physical Therapy

## 2013-03-29 ENCOUNTER — Telehealth: Payer: Self-pay

## 2013-03-29 NOTE — Telephone Encounter (Signed)
Shekita with [facility] will fax corrective copy of assessment plan and plan of treatment for Dr Elmer Sow signature.

## 2013-03-29 NOTE — Telephone Encounter (Signed)
Please see note below, forms are on your desk.

## 2013-03-30 NOTE — Telephone Encounter (Signed)
Forms faxed back to rehab.

## 2013-03-30 NOTE — Telephone Encounter (Signed)
Form signed and on my desk. 

## 2013-03-31 ENCOUNTER — Ambulatory Visit: Payer: BC Managed Care – PPO | Admitting: Rehabilitative and Restorative Service Providers"

## 2013-04-17 ENCOUNTER — Other Ambulatory Visit: Payer: Self-pay | Admitting: Family Medicine

## 2013-04-17 NOTE — Telephone Encounter (Signed)
Ok to refill? Last filled 03/01/13.

## 2013-04-17 NOTE — Telephone Encounter (Signed)
RX called to pharmacy.

## 2013-06-02 ENCOUNTER — Encounter: Payer: Self-pay | Admitting: Internal Medicine

## 2013-06-02 ENCOUNTER — Ambulatory Visit: Payer: BC Managed Care – PPO | Admitting: Internal Medicine

## 2013-06-02 ENCOUNTER — Ambulatory Visit (INDEPENDENT_AMBULATORY_CARE_PROVIDER_SITE_OTHER): Payer: BC Managed Care – PPO | Admitting: Internal Medicine

## 2013-06-02 VITALS — BP 112/80 | HR 89 | Temp 98.2°F

## 2013-06-02 DIAGNOSIS — T148XXA Other injury of unspecified body region, initial encounter: Secondary | ICD-10-CM

## 2013-06-02 DIAGNOSIS — M25512 Pain in left shoulder: Secondary | ICD-10-CM

## 2013-06-02 DIAGNOSIS — M25519 Pain in unspecified shoulder: Secondary | ICD-10-CM

## 2013-06-02 MED ORDER — METHYLPREDNISOLONE ACETATE 40 MG/ML IJ SUSP
80.0000 mg | Freq: Once | INTRAMUSCULAR | Status: AC
  Administered 2013-06-02: 80 mg via INTRAMUSCULAR

## 2013-06-02 MED ORDER — DIAZEPAM 5 MG PO TABS: 5.0000 mg | ORAL_TABLET | Freq: Two times a day (BID) | ORAL | Status: DC | PRN

## 2013-06-02 NOTE — Progress Notes (Signed)
Subjective:    Patient ID: Jennifer [name], female    DOB: 1960/10/02, 52 y.o.   MRN: [id number]  HPI  Pt presents to the clinic today with left shoulder pain. This started 5 days ago. She was trying to reach up into a jeep and felt like she strained her muscles. She went to [facility] last night and gave her shot of Toradol and RX for flexeril. These have not helped.  Review of Systems      History reviewed. No pertinent past medical history.  Current Outpatient Prescriptions  Medication Sig Dispense Refill  . ALPRAZolam (XANAX) 0.5 MG tablet TAKE 1 TABLET BY MOUTH 3 TIMES A DAY  90 tablet  0  . buPROPion (WELLBUTRIN SR) 150 MG 12 hr tablet TAKE 1 TABLET (150 MG TOTAL) BY MOUTH 3 (THREE) TIMES DAILY.  90 tablet  2  . fluticasone (FLONASE) 50 MCG/ACT nasal spray PLACE 2 SPRAYS IN EACH NOSTRIL DAILY  16 g  5  . loratadine (CLARITIN) 10 MG tablet Take one tablet by mouth daily as needed      . NON FORMULARY Take 20 mg by mouth 2 (two) times daily.      . ranitidine (ZANTAC) 150 MG capsule Take 150 mg by mouth daily.       No current facility-administered medications for this visit.    No Known Allergies  Family History  Problem Relation Age of Onset  . COPD Mother   . Thyroid disease Mother   . Diverticulosis Mother   . Heart attack Mother   . Gout Father   . Depression Sister   . Thyroid disease Sister   . Cancer Brother     kidney  . Heart attack Maternal Grandmother   . Heart attack Maternal Grandfather   . Heart attack Paternal Grandfather   . Hyperlipidemia Sister   . Evelene Croon Parkinson White syndrome Sister 25  . Depression Brother   . Hypertension Brother     History   Social History  . Marital Status: Married    Spouse Name: N/A    Number of Children: 1  . Years of Education: N/A   Occupational History  . home    Social History Main Topics  . Smoking status: Former Games developer  . Smokeless tobacco: Not on file  . Alcohol Use: Not on file  . Drug Use: Not on file   . Sexual Activity: Not on file   Other Topics Concern  . Not on file   Social History Narrative   Daughter has severe reflux since infancy     Constitutional: Denies fever, malaise, fatigue, headache or abrupt weight changes.  Musculoskeletal: Denies decrease in range of motion, difficulty with gait, or joint swelling.  Skin: Denies redness, rashes, lesions or ulcercations.  Neurological: Denies dizziness, difficulty with memory, difficulty with speech or problems with balance and coordination.   No other specific complaints in a complete review of systems (except as listed in HPI above).   Objective:   Physical Exam   BP 112/80  Pulse 89  Temp(Src) 98.2 F (36.8 C) (Oral)  SpO2 98% Wt Readings from Last 3 Encounters:  11/24/12 130 lb (58.968 kg)  05/23/12 135 lb (61.236 kg)  04/07/12 132 lb (59.875 kg)    General: Appears her stated age, well developed, well nourished in NAD. Skin: Warm, dry and intact. No rashes, lesions or ulcerations noted. Cardiovascular: Normal rate and rhythm. S1,S2 noted.  No murmur, rubs or gallops noted. No JVD or BLE  edema. No carotid bruits noted. Pulmonary/Chest: Normal effort and positive vesicular breath sounds. No respiratory distress. No wheezes, rales or ronchi noted.  Musculoskeletal: Left lower edge tender to palpation. Normal range of motion. No signs of joint swelling. No difficulty with gait.  Neurological: Alert and oriented. Cranial nerves II-XII intact. Coordination normal. +DTRs bilaterally.  BMET    Component Value Date/Time   NA 143 12/03/2009 1152   K 4.2 12/03/2009 1152   CL 105 12/03/2009 1152   CO2 30 12/03/2009 1152   GLUCOSE 85 12/03/2009 1152   BUN 14 12/03/2009 1152   CREATININE 0.7 12/03/2009 1152   CALCIUM 9.7 12/03/2009 1152   GFRNONAA 94.48 12/03/2009 1152   GFRAA 99 08/24/2007 1005    Lipid Panel     Component Value Date/Time   CHOL 243* 12/03/2009 1152   TRIG 260.0* 12/03/2009 1152   HDL 51.60 12/03/2009 1152    CHOLHDL 5 12/03/2009 1152   VLDL 52.0* 12/03/2009 1152    CBC    Component Value Date/Time   WBC 7.8 12/03/2009 1152   RBC 4.06 12/03/2009 1152   HGB 13.6 12/03/2009 1152   HCT 38.5 12/03/2009 1152   PLT 260.0 12/03/2009 1152   MCV 94.8 12/03/2009 1152   MCHC 35.3 12/03/2009 1152   RDW 12.3 12/03/2009 1152   LYMPHSABS 2.4 12/03/2009 1152   MONOABS 0.3 12/03/2009 1152   EOSABS 0.1 12/03/2009 1152   BASOSABS 0.0 12/03/2009 1152    Hgb A1C No results found for this basename: HGBA1C        Assessment & Plan:   Left shoulder pain, secondary to muscle strain:  80 mg of Depo IM today Stop flexeril, lets try valium Stretching exercises provided  RTC on Monday if no improvement RTC in as needed or if pain persist or worsens

## 2013-06-02 NOTE — Addendum Note (Signed)
Addended by: Criselda Peaches B on: 06/02/2013 01:51 PM   Modules accepted: Orders

## 2013-06-02 NOTE — Patient Instructions (Signed)

## 2013-06-08 ENCOUNTER — Other Ambulatory Visit: Payer: Self-pay | Admitting: Family Medicine

## 2013-06-08 NOTE — Telephone Encounter (Signed)
Ok to refill 

## 2013-06-09 NOTE — Telephone Encounter (Signed)
Ok to phone in.

## 2013-06-09 NOTE — Telephone Encounter (Signed)
Rx called in as directed.   

## 2013-06-19 ENCOUNTER — Telehealth: Payer: Self-pay | Admitting: Family Medicine

## 2013-06-19 NOTE — Telephone Encounter (Signed)
Patient's husband notified as instructed by telephone. Mr Stegeman said that she did remember the Valium rx for muscle strain; Pt is doing some better and if continues to have problem will call for fu appt.

## 2013-06-19 NOTE — Telephone Encounter (Signed)
Pt's left vm stating that his wife saw Nicki Reaper, NP a few weeks ago and she was supposed to have RX sent in for Vicodin but our office never called it in.  I see where Valium was ordered at [facility] but no Vicodin.

## 2013-06-19 NOTE — Telephone Encounter (Signed)
I would not have given an RX for vicodin for a muscle strain, also I know that has to be taken so if I did tell them I was going to give her that (which I didn't because I would have put that in my assessment and plan) then I would have given her a hand signed RX to take to the pharmacy

## 2013-06-27 ENCOUNTER — Other Ambulatory Visit: Payer: Self-pay | Admitting: Family Medicine

## 2013-07-09 ENCOUNTER — Other Ambulatory Visit: Payer: Self-pay | Admitting: Internal Medicine

## 2013-07-10 NOTE — Telephone Encounter (Signed)
Ok to phone in xanax 

## 2013-07-10 NOTE — Telephone Encounter (Signed)
Rx called in to pharmacy. 

## 2013-08-10 ENCOUNTER — Other Ambulatory Visit: Payer: Self-pay | Admitting: Internal Medicine

## 2013-08-10 NOTE — Telephone Encounter (Signed)
Last filled 07/09/13--please advise

## 2013-08-10 NOTE — Telephone Encounter (Signed)
Lm on pts vm informing her Rx has been sent to requested pharmacy 

## 2013-08-21 ENCOUNTER — Ambulatory Visit (INDEPENDENT_AMBULATORY_CARE_PROVIDER_SITE_OTHER): Payer: BC Managed Care – PPO | Admitting: Family Medicine

## 2013-08-21 VITALS — BP 138/86 | HR 85 | Temp 97.6°F | Ht 59.0 in | Wt 103.0 lb

## 2013-08-21 DIAGNOSIS — IMO0001 Reserved for inherently not codable concepts without codable children: Secondary | ICD-10-CM

## 2013-08-21 DIAGNOSIS — IMO0002 Reserved for concepts with insufficient information to code with codable children: Secondary | ICD-10-CM

## 2013-08-21 DIAGNOSIS — T148XXA Other injury of unspecified body region, initial encounter: Secondary | ICD-10-CM

## 2013-08-21 DIAGNOSIS — R350 Frequency of micturition: Secondary | ICD-10-CM

## 2013-08-21 LAB — POCT URINALYSIS DIPSTICK
Bilirubin, UA: NEGATIVE
Blood, UA: NEGATIVE
Glucose, UA: NEGATIVE
Ketones, UA: NEGATIVE
NITRITE UA: NEGATIVE
Spec Grav, UA: 1.01
UROBILINOGEN UA: 0.2
pH, UA: 8

## 2013-08-21 MED ORDER — DIAZEPAM 5 MG PO TABS: 5.0000 mg | ORAL_TABLET | Freq: Two times a day (BID) | ORAL | Status: DC | PRN

## 2013-08-21 MED ORDER — PREDNISONE 20 MG PO TABS: ORAL_TABLET | ORAL | Status: DC

## 2013-08-21 NOTE — Patient Instructions (Signed)
Good to see you. Please take prednisone as directed and you can also take Valium as needed --it will make you sleepy. Take prednisone in the morning and with food.

## 2013-08-21 NOTE — Assessment & Plan Note (Signed)
Consistent with sciatica/nerve impingement and likely related to ALS. Will treat with course of prednisone, valium as needed for spasm.

## 2013-08-21 NOTE — Assessment & Plan Note (Signed)
Trace LE and Trace protein. Send for cx.  No rx for now.

## 2013-08-21 NOTE — Progress Notes (Signed)
Subjective:    Patient ID: Jennifer [name], female    DOB: 1960/11/08, 53 y.o.   MRN: [id number]  HPI  Very pleasant female with h/o ALS here with her daughter for:  1.  Right sided back pain that radiates down lateral side of leg to her foot. Has tried muscle relaxants- not much relief.  Narcotics help but then she has a harder time walking due to ALS.  2.  Increased urinary frequency x 5 days.  No dysuria.  No fevers.  Patient Active Problem List   Diagnosis Date Noted  . Radicular pain of right lower back 08/21/2013  . Increased urinary frequency 08/21/2013  . Amyotrophic lateral sclerosis 05/23/2012  . Chronic sinusitis 12/29/2011  . ALS (amyotrophic lateral sclerosis) 09/28/2011  . 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 DYSARTHRIA 07/04/2010  . OTHER DYSPHAGIA 12/03/2009  . DEPRESSION 11/07/2008  . BACK PAIN, LUMBAR 08/24/2007  . HYPERLIPIDEMIA 08/03/2007  . TOBACCO ABUSE 08/03/2007  . ELEVATED BLOOD PRESSURE WITHOUT DIAGNOSIS OF HYPERTENSION 08/03/2007  . LUMBAR STRAIN 08/03/2007   No past medical history on file. Past Surgical History  Procedure Laterality Date  . Cesarean section     History  Substance Use Topics  . Smoking status: Former Games developermoker  . Smokeless tobacco: Not on file  . Alcohol Use: Not on file   Family History  Problem Relation Age of Onset  . COPD Mother   . Thyroid disease Mother   . Diverticulosis Mother   . Heart attack Mother   . Gout Father   . Depression Sister   . Thyroid disease Sister   . Cancer Brother     kidney  . Heart attack Maternal Grandmother   . Heart attack Maternal Grandfather   . Heart attack Paternal Grandfather   . Hyperlipidemia Sister   . Evelene CroonWolff Parkinson White syndrome Sister 25  . Depression Brother   . Hypertension Brother    Allergies  Allergen Reactions  . Lipitor [Atorvastatin]     Pt sts muscle discomfort   Current Outpatient Prescriptions on File Prior to Visit  Medication Sig  Dispense Refill  . ALPRAZolam (XANAX) 0.5 MG tablet TAKE 1 TABLET BY MOUTH 3 TIMES A DAY  90 tablet  0  . buPROPion (WELLBUTRIN SR) 150 MG 12 hr tablet TAKE 1 TABLET (150 MG TOTAL) BY MOUTH 3 (THREE) TIMES DAILY.  90 tablet  2  . fluticasone (FLONASE) 50 MCG/ACT nasal spray PLACE 2 SPRAYS IN EACH NOSTRIL DAILY  16 g  5  . loratadine (CLARITIN) 10 MG tablet Take one tablet by mouth daily as needed      . NON FORMULARY Take 20 mg by mouth 2 (two) times daily.      . ranitidine (ZANTAC) 150 MG capsule Take 150 mg by mouth daily.       No current facility-administered medications on file prior to visit.   The PMH, PSH, Social History, Family History, Medications, and allergies [facility], and have been updated if relevant.  Review of Systems See HPI No nausea or vomiting No hematuria    Objective:   Physical Exam BP 138/86  Pulse 85  Temp(Src) 97.6 F (36.4 C) (Oral)  Ht 4\' 11"  (1.499 m)  Wt 103 lb (46.72 kg)  BMI 20.79 kg/m2  SpO2 97% Gen:  Walks slowly with cane- daughter assisting her MSK: No tenderness over spine, + SLR bilaterally, right >left +increased tone  Abd:  No suprapubic tenderness  Assessment & Plan:

## 2013-08-21 NOTE — Progress Notes (Signed)
Pre-visit discussion using our clinic review tool. No additional management support is needed unless otherwise documented below in the visit note.  

## 2013-08-21 NOTE — Addendum Note (Signed)
Addended by: Patience MuscaISLEY, Mija Effertz M on: 08/21/2013 04:40 PM   Modules accepted: Orders

## 2013-08-23 LAB — URINE CULTURE
COLONY COUNT: NO GROWTH
Organism ID, Bacteria: NO GROWTH

## 2013-08-24 ENCOUNTER — Telehealth: Payer: Self-pay

## 2013-08-24 DIAGNOSIS — T148XXA Other injury of unspecified body region, initial encounter: Secondary | ICD-10-CM

## 2013-08-24 MED ORDER — DIAZEPAM 5 MG PO TABS: 5.0000 mg | ORAL_TABLET | Freq: Four times a day (QID) | ORAL | Status: DC | PRN

## 2013-08-24 NOTE — Telephone Encounter (Signed)
Ok to refill rx for one month as requested.

## 2013-08-24 NOTE — Telephone Encounter (Signed)
Pt's husband left v/m; Mr Shella SpearingBoyer said diazepam 5 mg is working well for pt except med does not last more than 6 hours. Request new rx Diazepam 5 mg one tab by mouth q6h prn for muscle spasms[facility]. Mr Shella SpearingBoyer request cb.

## 2013-08-24 NOTE — Telephone Encounter (Signed)
Spoke to pt and informed him that Rx has been called in to requested pharmacy; med list updated

## 2013-08-28 ENCOUNTER — Telehealth: Payer: Self-pay | Admitting: *Deleted

## 2013-08-28 NOTE — Telephone Encounter (Signed)
Spoke with husband again and he will pay out of pocket if needed. I also spoke with the pharmacist and she will make the refill on the 26th a 7 day supply and can run the new one thru tomorrow

## 2013-08-28 NOTE — Telephone Encounter (Signed)
Please call pharmacy to ask what we need to do. Thanks.

## 2013-08-28 NOTE — Telephone Encounter (Signed)
So do we need to call in a new rx?

## 2013-08-28 NOTE — Telephone Encounter (Signed)
No everything is taking care of

## 2013-08-28 NOTE — Telephone Encounter (Signed)
Pt's husband calling stating that Diazepam refill has not been called in for the new directions, which it was called in but it's on hold. Per the pharmacy husband picked up rx on 08/21/13 and can not get the new one for 15 days the insurance will not pay.

## 2013-09-04 ENCOUNTER — Telehealth: Payer: Self-pay | Admitting: *Deleted

## 2013-09-04 NOTE — Telephone Encounter (Signed)
Message received from pts husband indicating that pt is needing to be seen for pain. Spoke to pts husband and appt scheduled.

## 2013-09-04 NOTE — Telephone Encounter (Signed)
pts husband was wanting pt to be seen on 09/04/13 but was unable to verify if only available slot would work with pts scheduled. appt scheduled for 02/11, and pts husband states that he will call office back and reschedule if pt not able to attend.

## 2013-09-05 ENCOUNTER — Encounter: Payer: Self-pay | Admitting: Radiology

## 2013-09-06 ENCOUNTER — Ambulatory Visit: Payer: BC Managed Care – PPO | Admitting: Family Medicine

## 2013-09-07 ENCOUNTER — Encounter: Payer: Self-pay | Admitting: Radiology

## 2013-09-08 ENCOUNTER — Ambulatory Visit: Payer: BC Managed Care – PPO | Admitting: Internal Medicine

## 2013-09-08 ENCOUNTER — Ambulatory Visit: Payer: BC Managed Care – PPO | Admitting: Family Medicine

## 2013-09-11 ENCOUNTER — Encounter: Payer: Self-pay | Admitting: Family Medicine

## 2013-09-11 ENCOUNTER — Ambulatory Visit (INDEPENDENT_AMBULATORY_CARE_PROVIDER_SITE_OTHER): Payer: BC Managed Care – PPO | Admitting: Family Medicine

## 2013-09-11 ENCOUNTER — Ambulatory Visit (INDEPENDENT_AMBULATORY_CARE_PROVIDER_SITE_OTHER)
Admission: RE | Admit: 2013-09-11 | Discharge: 2013-09-11 | Disposition: A | Discharge status: Home / Self Care | Payer: BC Managed Care – PPO | Source: Ambulatory Visit | Attending: Family Medicine | Admitting: Family Medicine

## 2013-09-11 VITALS — BP 128/78 | HR 89 | Temp 98.0°F

## 2013-09-11 DIAGNOSIS — F329 Major depressive disorder, single episode, unspecified: Secondary | ICD-10-CM

## 2013-09-11 DIAGNOSIS — F3289 Other specified depressive episodes: Secondary | ICD-10-CM

## 2013-09-11 DIAGNOSIS — IMO0001 Reserved for inherently not codable concepts without codable children: Secondary | ICD-10-CM

## 2013-09-11 DIAGNOSIS — IMO0002 Reserved for concepts with insufficient information to code with codable children: Secondary | ICD-10-CM

## 2013-09-11 NOTE — Assessment & Plan Note (Signed)
Not improved- does still seem like impingement but may also be related to her ALS.  Will get xray today given that this is her 3rd office visit for this complaint and then refer to ortho. Prednisone added to allergy list. The patient indicates understanding of these issues and agrees with the plan.

## 2013-09-11 NOTE — Assessment & Plan Note (Signed)
Deteriorated. No longer suicidal. She refuses to try any other meds, including SSRIs. On Wellbutrin maximum dose.

## 2013-09-11 NOTE — Patient Instructions (Signed)
Great to see you. I will call you with your xray results.  Call me next week with an update.

## 2013-09-11 NOTE — Progress Notes (Signed)
Pre-visit discussion using our clinic review tool. No additional management support is needed unless otherwise documented below in the visit note.  

## 2013-09-11 NOTE — Progress Notes (Signed)
Subjective:    Patient ID: Jennifer [name], female    DOB: 20-Sep-1960, 53 y.o.   MRN: [id number]  Leg Pain   Back Pain Associated symptoms include leg pain.    Very pleasant female with h/o ALS here with her husband for persist back pain.  Has already been seen twice here for this issue.  I saw her on 1/26 for this- given course of prednisone and valium.   Also urine cx- neg.  Unfortunately had a bad reaction to prednisone became very depressed- was actually psychotic and suicidal. No longer psychotic or suicidal but does feel her depression is worse.  Back pain was improved with prednisone but now worse again. Right sided back pain that radiates down lateral side of leg to her foot. Has tried muscle relaxants- not much relief.  Narcotics help but then she has a harder time walking due to ALS.    Patient Active Problem List   Diagnosis Date Noted  . Radicular pain of right lower back 08/21/2013  . Increased urinary frequency 08/21/2013  . Amyotrophic lateral sclerosis 05/23/2012  . Chronic sinusitis 12/29/2011  . ALS (amyotrophic lateral sclerosis) 09/28/2011  . 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 DYSARTHRIA 07/04/2010  . OTHER DYSPHAGIA 12/03/2009  . DEPRESSION 11/07/2008  . BACK PAIN, LUMBAR 08/24/2007  . HYPERLIPIDEMIA 08/03/2007  . TOBACCO ABUSE 08/03/2007  . ELEVATED BLOOD PRESSURE WITHOUT DIAGNOSIS OF HYPERTENSION 08/03/2007  . LUMBAR STRAIN 08/03/2007   No past medical history on file. Past Surgical History  Procedure Laterality Date  . Cesarean section     History  Substance Use Topics  . Smoking status: Former Games developer  . Smokeless tobacco: Not on file  . Alcohol Use: Not on file   Family History  Problem Relation Age of Onset  . COPD Mother   . Thyroid disease Mother   . Diverticulosis Mother   . Heart attack Mother   . Gout Father   . Depression Sister   . Thyroid disease Sister   . Cancer Brother     kidney  . Heart attack  Maternal Grandmother   . Heart attack Maternal Grandfather   . Heart attack Paternal Grandfather   . Hyperlipidemia Sister   . Evelene Croon Parkinson White syndrome Sister 25  . Depression Brother   . Hypertension Brother    Allergies  Allergen Reactions  . Lipitor [Atorvastatin]     Pt sts muscle discomfort   Current Outpatient Prescriptions on File Prior to Visit  Medication Sig Dispense Refill  . ALPRAZolam (XANAX) 0.5 MG tablet TAKE 1 TABLET BY MOUTH 3 TIMES A DAY  90 tablet  0  . buPROPion (WELLBUTRIN SR) 150 MG 12 hr tablet TAKE 1 TABLET (150 MG TOTAL) BY MOUTH 3 (THREE) TIMES DAILY.  90 tablet  2  . diazepam (VALIUM) 5 MG tablet Take 1 tablet (5 mg total) by mouth every 6 (six) hours as needed for muscle spasms.  30 tablet  1  . fluticasone (FLONASE) 50 MCG/ACT nasal spray PLACE 2 SPRAYS IN EACH NOSTRIL DAILY  16 g  5  . loratadine (CLARITIN) 10 MG tablet Take one tablet by mouth daily as needed      . NON FORMULARY Take 20 mg by mouth 2 (two) times daily.      . ranitidine (ZANTAC) 150 MG capsule Take 150 mg by mouth daily.       No current facility-administered medications on file prior to visit.   The PMH, PSH, Social  History, Family History, Medications, and allergies [facility], and have been updated if relevant.  Review of Systems  Musculoskeletal: Positive for back pain.   See HPI No nausea or vomiting     Objective:   Physical Exam BP 128/78  Pulse 89  Temp(Src) 98 F (36.7 C) (Oral)  SpO2 99% Gen:  Walks slowly with cane- daughter assisting her MSK: No tenderness over spine, + SLR bilaterally, right >left +increased tone       Assessment & Plan:

## 2013-09-13 ENCOUNTER — Telehealth: Payer: Self-pay | Admitting: Internal Medicine

## 2013-09-14 NOTE — Telephone Encounter (Signed)
Last filled 08/10/13 and last OV 09/11/13--please advise

## 2013-09-14 NOTE — Telephone Encounter (Signed)
Lm on pharmacy vm clarifying sig for xanax

## 2013-09-14 NOTE — Telephone Encounter (Signed)
Megan with [facility] left v/m requesting cb to verify instructions for xanax 0.25 mg; Aundra MilletMegan said 1st instructions recorded were take 2 tabs by mouth 3 times a day but when repeated instructions were take  2 - 3 tabs by mouth 3 times a day. Megan request Marcelline MatesWaynetta to call [facility].

## 2013-09-14 NOTE — Telephone Encounter (Signed)
Spoke to pt and informed her Rx has been called in to requested pharmacy 

## 2013-09-15 ENCOUNTER — Other Ambulatory Visit: Payer: Self-pay | Admitting: Family Medicine

## 2013-09-15 ENCOUNTER — Other Ambulatory Visit: Payer: Self-pay | Admitting: *Deleted

## 2013-09-15 ENCOUNTER — Telehealth: Payer: Self-pay

## 2013-09-15 DIAGNOSIS — IMO0001 Reserved for inherently not codable concepts without codable children: Secondary | ICD-10-CM

## 2013-09-15 DIAGNOSIS — G1221 Amyotrophic lateral sclerosis: Secondary | ICD-10-CM

## 2013-09-15 MED ORDER — BUPROPION HCL ER (SR) 150 MG PO TB12: 150.0000 mg | ORAL_TABLET | Freq: Three times a day (TID) | ORAL | Status: DC

## 2013-09-15 MED ORDER — HYDROCODONE-ACETAMINOPHEN 5-325 MG PO TABS: 1.0000 | ORAL_TABLET | Freq: Four times a day (QID) | ORAL | Status: DC | PRN

## 2013-09-15 NOTE — Telephone Encounter (Signed)
Spoke to pts husband and informed him Rx is available for pickup at the front desk;informed a gov't issued photo id required

## 2013-09-15 NOTE — Telephone Encounter (Signed)
pts husband left v/m requesting rx for vicodin 5 mg with instructions one tab twice a dayor whatever dosage and instructions Dr Dayton MartesAron will prescibe until pt can get appt with orthopedic dr; Mr Shella SpearingBoyer said Dr Dayton MartesAron is doing referral for orthopedic doctor. Not on med list. Pt having lower back pain. [facility].Mr Shella SpearingBoyer understands Dr Dayton MartesAron will not be in until this afternoon and Mr Shella SpearingBoyer can pick up anytime this afternoon. Mr Shella SpearingBoyer request cb.

## 2013-09-15 NOTE — Telephone Encounter (Signed)
Request is appropriate.  Rx printed.

## 2013-09-20 ENCOUNTER — Other Ambulatory Visit: Payer: Self-pay | Admitting: Family Medicine

## 2013-09-20 NOTE — Telephone Encounter (Signed)
Pt requesting medication refill. Last ov 09/10/13. Pt is requesting xanax 0.25mg  but I show that that is on her Hx list only and last refill was for 0.5mg . I attempted to contact pt to verify strength, but was unable to reach her. pls advise

## 2013-09-20 NOTE — Telephone Encounter (Signed)
Spoke to pts husband and informed him Rx has been called in to requested pharmacy 

## 2013-09-21 ENCOUNTER — Other Ambulatory Visit: Payer: Self-pay | Admitting: Family Medicine

## 2013-10-04 ENCOUNTER — Ambulatory Visit: Payer: Self-pay | Admitting: Orthopedic Surgery

## 2013-10-14 ENCOUNTER — Observation Stay: Payer: Self-pay | Admitting: Internal Medicine

## 2013-10-14 LAB — CBC WITH DIFFERENTIAL/PLATELET
BASOS ABS: 0.1 10*3/uL (ref 0.0–0.1)
Basophil %: 0.7 %
EOS ABS: 0.1 10*3/uL (ref 0.0–0.7)
Eosinophil %: 0.7 %
HCT: 37.9 % (ref 35.0–47.0)
HGB: 12.9 g/dL (ref 12.0–16.0)
Lymphocyte #: 1.4 10*3/uL (ref 1.0–3.6)
Lymphocyte %: 17.8 %
MCH: 31.8 pg (ref 26.0–34.0)
MCHC: 34.2 g/dL (ref 32.0–36.0)
MCV: 93 fL (ref 80–100)
MONO ABS: 0.4 x10 3/mm (ref 0.2–0.9)
MONOS PCT: 4.6 %
NEUTROS ABS: 6.1 10*3/uL (ref 1.4–6.5)
Neutrophil %: 76.2 %
Platelet: 249 10*3/uL (ref 150–440)
RBC: 4.07 10*6/uL (ref 3.80–5.20)
RDW: 12.3 % (ref 11.5–14.5)
WBC: 8 10*3/uL (ref 3.6–11.0)

## 2013-10-14 LAB — COMPREHENSIVE METABOLIC PANEL
ALT: 20 U/L (ref 12–78)
Albumin: 4.1 g/dL (ref 3.4–5.0)
Alkaline Phosphatase: 62 U/L
Anion Gap: 3 — ABNORMAL LOW (ref 7–16)
BUN: 13 mg/dL (ref 7–18)
Bilirubin,Total: 0.3 mg/dL (ref 0.2–1.0)
CALCIUM: 9.3 mg/dL (ref 8.5–10.1)
CO2: 26 mmol/L (ref 21–32)
Chloride: 110 mmol/L — ABNORMAL HIGH (ref 98–107)
Creatinine: 0.65 mg/dL (ref 0.60–1.30)
EGFR (African American): >60
EGFR (Non-African Amer.): >60
GLUCOSE: 86 mg/dL (ref 65–99)
Osmolality: 277 (ref 275–301)
Potassium: 4.7 mmol/L (ref 3.5–5.1)
SGOT(AST): 30 U/L (ref 15–37)
Sodium: 139 mmol/L (ref 136–145)
Total Protein: 7.9 g/dL (ref 6.4–8.2)

## 2013-10-14 LAB — URINALYSIS, COMPLETE
Bilirubin,UR: NEGATIVE
Blood: NEGATIVE
GLUCOSE, UR: NEGATIVE mg/dL (ref 0–75)
LEUKOCYTE ESTERASE: NEGATIVE
Nitrite: NEGATIVE
PROTEIN: NEGATIVE
Ph: 5 (ref 4.5–8.0)
Specific Gravity: 1.014 (ref 1.003–1.030)
WBC UR: 2 /HPF (ref 0–5)

## 2013-10-15 ENCOUNTER — Ambulatory Visit: Payer: Self-pay | Admitting: Neurology

## 2013-10-15 LAB — CBC WITH DIFFERENTIAL/PLATELET
Basophil #: 0 10*3/uL (ref 0.0–0.1)
Basophil %: 0.5 %
Eosinophil #: 0.1 10*3/uL (ref 0.0–0.7)
Eosinophil %: 0.9 %
HCT: 35.7 % (ref 35.0–47.0)
HGB: 12.4 g/dL (ref 12.0–16.0)
LYMPHS PCT: 12 %
Lymphocyte #: 1.2 10*3/uL (ref 1.0–3.6)
MCH: 32.8 pg (ref 26.0–34.0)
MCHC: 34.8 g/dL (ref 32.0–36.0)
MCV: 94 fL (ref 80–100)
Monocyte #: 0.4 x10 3/mm (ref 0.2–0.9)
Monocyte %: 3.8 %
NEUTROS PCT: 82.8 %
Neutrophil #: 8.1 10*3/uL — ABNORMAL HIGH (ref 1.4–6.5)
PLATELETS: 213 10*3/uL (ref 150–440)
RBC: 3.79 10*6/uL — ABNORMAL LOW (ref 3.80–5.20)
RDW: 12.2 % (ref 11.5–14.5)
WBC: 9.8 10*3/uL (ref 3.6–11.0)

## 2013-10-15 LAB — BASIC METABOLIC PANEL
Anion Gap: 5 — ABNORMAL LOW (ref 7–16)
BUN: 10 mg/dL (ref 7–18)
CREATININE: 0.63 mg/dL (ref 0.60–1.30)
Calcium, Total: 8.7 mg/dL (ref 8.5–10.1)
Chloride: 110 mmol/L — ABNORMAL HIGH (ref 98–107)
Co2: 25 mmol/L (ref 21–32)
EGFR (African American): >60
EGFR (Non-African Amer.): >60
Glucose: 71 mg/dL (ref 65–99)
Osmolality: 277 (ref 275–301)
Potassium: 3.5 mmol/L (ref 3.5–5.1)
SODIUM: 140 mmol/L (ref 136–145)

## 2013-10-16 ENCOUNTER — Ambulatory Visit: Payer: Self-pay | Admitting: Hospice and Palliative Medicine

## 2013-10-19 ENCOUNTER — Ambulatory Visit: Payer: BC Managed Care – PPO | Admitting: Family Medicine

## 2013-10-19 ENCOUNTER — Other Ambulatory Visit: Payer: Self-pay | Admitting: Family Medicine

## 2013-10-20 ENCOUNTER — Telehealth: Payer: Self-pay

## 2013-10-20 NOTE — Telephone Encounter (Signed)
Ok to give verbal orders as requested. 

## 2013-10-20 NOTE — Telephone Encounter (Signed)
Spoke to VoloAnn and provided verbal orders for PT

## 2013-10-20 NOTE — Telephone Encounter (Signed)
Ann PT with [facility] left v/m requesting verbal orders for home health PT for gait training and transfer training 2 x a week for six weeks and 1 x a week times 1 week.

## 2013-10-24 ENCOUNTER — Telehealth: Payer: Self-pay

## 2013-10-24 ENCOUNTER — Ambulatory Visit (INDEPENDENT_AMBULATORY_CARE_PROVIDER_SITE_OTHER): Payer: BC Managed Care – PPO | Admitting: Family Medicine

## 2013-10-24 ENCOUNTER — Encounter: Payer: Self-pay | Admitting: Family Medicine

## 2013-10-24 VITALS — BP 130/70 | HR 82 | Temp 97.6°F | Wt 116.8 lb

## 2013-10-24 DIAGNOSIS — IMO0002 Reserved for concepts with insufficient information to code with codable children: Secondary | ICD-10-CM

## 2013-10-24 DIAGNOSIS — T7840XA Allergy, unspecified, initial encounter: Secondary | ICD-10-CM | POA: Insufficient documentation

## 2013-10-24 DIAGNOSIS — G249 Dystonia, unspecified: Secondary | ICD-10-CM | POA: Insufficient documentation

## 2013-10-24 DIAGNOSIS — R259 Unspecified abnormal involuntary movements: Secondary | ICD-10-CM

## 2013-10-24 DIAGNOSIS — IMO0001 Reserved for inherently not codable concepts without codable children: Secondary | ICD-10-CM

## 2013-10-24 DIAGNOSIS — T148XXA Other injury of unspecified body region, initial encounter: Secondary | ICD-10-CM

## 2013-10-24 DIAGNOSIS — G1221 Amyotrophic lateral sclerosis: Secondary | ICD-10-CM

## 2013-10-24 MED ORDER — DIAZEPAM 5 MG PO TABS: 5.0000 mg | ORAL_TABLET | Freq: Four times a day (QID) | ORAL | Status: DC | PRN

## 2013-10-24 MED ORDER — DIAZEPAM 5 MG PO TABS: 10.0000 mg | ORAL_TABLET | Freq: Four times a day (QID) | ORAL | Status: DC | PRN

## 2013-10-24 NOTE — Telephone Encounter (Signed)
Mr Jennifer [name] left v/m; pt was seen today and received rx for diazepam 5 mg with instructions to take 1 tab by mouth q 6 h as needed for muscle spasms; Mr Jennifer [name] said instructions should read 2 tabs by mouth q 6 h prn for muscles spasms. Pt has not filled the rx given at visit and request new rx with new instructions; Mr Jennifer [name] request cb.

## 2013-10-24 NOTE — Assessment & Plan Note (Signed)
Resolved. Continue valium.

## 2013-10-24 NOTE — Telephone Encounter (Signed)
Ok to change rx as requested and to call in new rx.

## 2013-10-24 NOTE — Telephone Encounter (Signed)
Lm on pts vm informing her and husband that Rx has been called in to requested pharmacy

## 2013-10-24 NOTE — Progress Notes (Signed)
Subjective:   Patient ID: Jennifer [name], female    DOB: 24-Nov-1960, 53 y.o.   MRN: [id number]  Jennifer RumpleKatherine Rabenold is a pleasant 53 y.o. year old female with h/o ALS who presents to clinic today with her husband for Hospitalization Follow-up  on 10/24/2013  HPI: Follow[facility].  Has been having more falls and balance issues lately.  Due to sciatica pain, was taking oxycodone.  Last Saturday, she felt "all of her muscles lock up."  Husband had to call EMS. We do not yet have discharge summary but per husbands report, multiple xrays (including back, hip) and blood work were all unremarkable.  Felt her symptoms were secondary to adverse rxn to oxycycodone.  Taking Valium every 6 hours and spasms have resolved.  Sciatica pain has resolved as well.  Since discharge, has been working with home health ST and PT.  One week after discharge, she leaned forward to get something and fell and hit left side of her head.  She was not evaluated after fall.  No LOC.  No vomiting.  No blurred vision.  Patient Active Problem List   Diagnosis Date Noted  . Radicular pain of right lower back 08/21/2013  . Increased urinary frequency 08/21/2013  . Amyotrophic lateral sclerosis 05/23/2012  . Chronic sinusitis 12/29/2011  . ALS (amyotrophic lateral sclerosis) 09/28/2011  . 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 DYSARTHRIA 07/04/2010  . OTHER DYSPHAGIA 12/03/2009  . DEPRESSION 11/07/2008  . BACK PAIN, LUMBAR 08/24/2007  . HYPERLIPIDEMIA 08/03/2007  . TOBACCO ABUSE 08/03/2007  . ELEVATED BLOOD PRESSURE WITHOUT DIAGNOSIS OF HYPERTENSION 08/03/2007  . LUMBAR STRAIN 08/03/2007   No past medical history on file. Past Surgical History  Procedure Laterality Date  . Cesarean section     History  Substance Use Topics  . Smoking status: Former Games developermoker  . Smokeless tobacco: Not on file  . Alcohol Use: Not on file   Family History  Problem Relation Age of Onset    . COPD Mother   . Thyroid disease Mother   . Diverticulosis Mother   . Heart attack Mother   . Gout Father   . Depression Sister   . Thyroid disease Sister   . Cancer Brother     kidney  . Heart attack Maternal Grandmother   . Heart attack Maternal Grandfather   . Heart attack Paternal Grandfather   . Hyperlipidemia Sister   . Evelene CroonWolff Parkinson White syndrome Sister 25  . Depression Brother   . Hypertension Brother    Allergies  Allergen Reactions  . Lipitor [Atorvastatin]     Pt sts muscle discomfort  . Oxycodone   . Prednisone     psychosis  . Vicodin [Hydrocodone-Acetaminophen]    Current Outpatient Prescriptions on File Prior to Visit  Medication Sig Dispense Refill  . ALPRAZolam (XANAX) 0.5 MG tablet TAKE 1 TABLET BY MOUTH 3 TIMES A DAY  90 tablet  0  . buPROPion (WELLBUTRIN SR) 150 MG 12 hr tablet TAKE 1 TABLET (150 MG TOTAL) BY MOUTH 3 (THREE) TIMES DAILY.  90 tablet  2  . fluticasone (FLONASE) 50 MCG/ACT nasal spray USE 2 SPRAYS IN EACH NOSTRIL EVERY DAY AS DIRECTED  16 g  5  . loratadine (CLARITIN) 10 MG tablet Take one tablet by mouth daily as needed      . NON FORMULARY Take 20 mg by mouth 2 (two) times daily.      . ranitidine (ZANTAC) 150 MG  capsule Take 150 mg by mouth daily.       No current facility-administered medications on file prior to visit.   The PMH, PSH, Social History, Family History, Medications, and allergies have been reviewed in [facility], and have been updated if relevant.    Review of Systems See HPI Denies anxiety Denies muscle or back pain    Objective:    BP 130/70  Pulse 82  Temp(Src) 97.6 F (36.4 C) (Oral)  Wt 116 lb 12 oz (52.957 kg)  SpO2 98%   Physical Exam Gen:  Alert, dysarthric speech (Baseline) HEENT:  bruising under and above left eye PERRL Neuro: Walking slowly with walker Good grip strength bilaterally     Assessment & Plan:   Muscle strain - Plan: diazepam (VALIUM) 5 MG tablet No Follow-up on file.

## 2013-10-24 NOTE — Assessment & Plan Note (Signed)
Resolved. Oxycodone added to allergy list.  She has been taking Ibuprofen with relief of symptoms.

## 2013-10-24 NOTE — Assessment & Plan Note (Signed)
With worsening balance issues. Continue PT. Consider adding OT in future. Keep appt with Dr. Zannie KehrBedlack in April. The patient indicates understanding of these issues and agrees with the plan.

## 2013-10-24 NOTE — Progress Notes (Signed)
Pre visit review using our clinic review tool, if applicable. No additional management support is needed unless otherwise documented below in the visit note. 

## 2013-10-26 ENCOUNTER — Other Ambulatory Visit: Payer: Self-pay | Admitting: Family Medicine

## 2013-10-26 DIAGNOSIS — Z0279 Encounter for issue of other medical certificate: Secondary | ICD-10-CM

## 2013-11-12 ENCOUNTER — Other Ambulatory Visit: Payer: Self-pay | Admitting: Family Medicine

## 2013-11-13 NOTE — Telephone Encounter (Signed)
Pt requesting medication refill. Last ov 09/2013 with no future appts scheduled. pls advise 

## 2013-11-13 NOTE — Telephone Encounter (Signed)
Lm on pts vm informing her Rx has been faxed to requested pharmacy.  

## 2013-11-28 DIAGNOSIS — R1312 Dysphagia, oropharyngeal phase: Secondary | ICD-10-CM

## 2013-11-28 DIAGNOSIS — K5900 Constipation, unspecified: Secondary | ICD-10-CM | POA: Insufficient documentation

## 2013-11-28 DIAGNOSIS — M62838 Other muscle spasm: Secondary | ICD-10-CM | POA: Insufficient documentation

## 2013-11-28 HISTORY — DX: Constipation, unspecified: K59.00

## 2013-11-28 HISTORY — DX: Dysphagia, oropharyngeal phase: R13.12

## 2013-12-14 ENCOUNTER — Other Ambulatory Visit: Payer: Self-pay

## 2013-12-14 MED ORDER — BUPROPION HCL ER (SR) 150 MG PO TB12: ORAL_TABLET | ORAL | Status: DC

## 2013-12-14 NOTE — Telephone Encounter (Signed)
Ok to refill as requested 

## 2013-12-14 NOTE — Telephone Encounter (Signed)
Mr Jennifer [name] said wellbutrin SR 150 mg was increased to 1 tab four times a day by Dr Dayton MartesAron at or around the last visit?; request new rx sent to [facility] with new instructons and # 120 quantity.Please advise. I didi not change the instructions of tid or quantity that are already on med list until Dr Dayton MartesAron approves.

## 2014-01-05 ENCOUNTER — Other Ambulatory Visit: Payer: Self-pay | Admitting: Family Medicine

## 2014-03-03 ENCOUNTER — Other Ambulatory Visit: Payer: Self-pay | Admitting: Family Medicine

## 2014-03-05 ENCOUNTER — Other Ambulatory Visit: Payer: Self-pay | Admitting: Family Medicine

## 2014-03-06 NOTE — Telephone Encounter (Signed)
Don left v/m [facility] Pt is presently taking bupropion SR  2 tabs in AM and 2 tabs in PM.Please advise.

## 2014-03-08 ENCOUNTER — Ambulatory Visit (INDEPENDENT_AMBULATORY_CARE_PROVIDER_SITE_OTHER): Payer: BC Managed Care – PPO | Admitting: Internal Medicine

## 2014-03-08 ENCOUNTER — Encounter: Payer: Self-pay | Admitting: Internal Medicine

## 2014-03-08 VITALS — BP 108/70 | HR 82 | Temp 98.3°F | Wt 112.0 lb

## 2014-03-08 DIAGNOSIS — IMO0001 Reserved for inherently not codable concepts without codable children: Secondary | ICD-10-CM

## 2014-03-08 DIAGNOSIS — N3289 Other specified disorders of bladder: Secondary | ICD-10-CM

## 2014-03-08 DIAGNOSIS — R339 Retention of urine, unspecified: Secondary | ICD-10-CM

## 2014-03-08 DIAGNOSIS — R3989 Other symptoms and signs involving the genitourinary system: Secondary | ICD-10-CM

## 2014-03-08 DIAGNOSIS — R350 Frequency of micturition: Secondary | ICD-10-CM

## 2014-03-08 NOTE — Progress Notes (Signed)
Pre visit review using our clinic review tool, if applicable. No additional management support is needed unless otherwise documented below in the visit note. 

## 2014-03-08 NOTE — Progress Notes (Signed)
HPI  Pt presents to the clinic today with c/o urinary frequency and bladder pressure. She reports this started 2-3 weeks ago. She denies fever, chills or low back pain. She feels like she urinates all day long then is unable to urinate at all during the night. She is concerned that the ALS may be affecting her bladder. She denies dysuria or burning sensation. She would like to know if there is something she can take to help relax her bladder so that she is able to urinate more effectively.   Review of Systems  No past medical history on file.  Family History  Problem Relation Age of Onset  . COPD Mother   . Thyroid disease Mother   . Diverticulosis Mother   . Heart attack Mother   . Gout Father   . Depression Sister   . Thyroid disease Sister   . Cancer Brother     kidney  . Heart attack Maternal Grandmother   . Heart attack Maternal Grandfather   . Heart attack Paternal Grandfather   . Hyperlipidemia Sister   . Evelene Croon Parkinson White syndrome Sister 25  . Depression Brother   . Hypertension Brother     History   Social History  . Marital Status: Married    Spouse Name: N/A    Number of Children: 1  . Years of Education: N/A   Occupational History  . home    Social History Main Topics  . Smoking status: Former Games developer  . Smokeless tobacco: Not on file  . Alcohol Use: Not on file  . Drug Use: Not on file  . Sexual Activity: Not on file   Other Topics Concern  . Not on file   Social History Narrative   Daughter has severe reflux since infancy    Allergies  Allergen Reactions  . Lipitor [Atorvastatin]     Pt sts muscle discomfort  . Oxycodone   . Prednisone     psychosis  . Vicodin [Hydrocodone-Acetaminophen]     Constitutional: Denies fever, malaise, fatigue, headache or abrupt weight changes.   GU: Pt reports frequency, pressure and inability to empty her bladder during the night. Denies urgency, burning sensation, blood in urine, odor or  discharge. Skin: Denies redness, rashes, lesions or ulcercations.   No other specific complaints in a complete review of systems (except as listed in HPI above).    Objective:   Physical Exam  BP 108/70  Pulse 82  Temp(Src) 98.3 F (36.8 C) (Oral)  Wt 112 lb (50.803 kg)  SpO2 99%  Wt Readings from Last 3 Encounters:  10/24/13 116 lb 12 oz (52.957 kg)  08/21/13 103 lb (46.72 kg)  11/24/12 130 lb (58.968 kg)    General: Appears her stated age, in NAD. Cardiovascular: Normal rate and rhythm. S1,S2 noted.  No murmur, rubs or gallops noted. No JVD or BLE edema. No carotid bruits noted. Pulmonary/Chest: Normal effort and positive vesicular breath sounds. No respiratory distress. No wheezes, rales or ronchi noted.  Abdomen: Soft and nontender. Normal bowel sounds, no bruits noted. No distention or masses noted. Liver, spleen and kidneys non palpable.[name] No CVA tenderness.      Assessment & Plan:   Frequency, bladder pressure and inability to urinate at night.   Urinalysis: She was unable to give a sample today Does not sound infectious ? R/T ALS I would prefer not to start her on anything without talking to Dr. Dayton Martes She does followup with her neurologist next week  Will  discuss with Dr. Dayton MartesAron and get back with you  RTC as needed or if symptoms persist.

## 2014-03-08 NOTE — Patient Instructions (Addendum)
Neurogenic Bladder °Neurogenic bladder is a loss of normal control of bladder function. This is caused by damaged nerves, which can be a result of a variety of injuries and diseases.  °The muscles and nerves of the urinary system work together to store urine and release urine at the right time. Nerves carry messages from the bladder to the brain and from the brain to the muscles of the bladder. In a neurogenic bladder, the nerves that are supposed to carry these messages do not work properly.There are 2 types of neurogenic bladder: °· Overactive.  The bladder is unable to control when or how much to urinate. Even when only a small amount of urine is in the bladder, there may be an urge to urinate that cannot be controlled. This can result in wetting accidents (urge incontinence). °· Underactive. The bladder holds much more urine than normal. Small amounts of urine leak out as bladder pressure builds because there is not a sensation that the bladder is full. This can also result in wetting accidents. °CAUSES  °Neurogenic bladder can be caused by many problems. These include:  °· Stroke. °· Multiple sclerosis. °· Infection. °· Trauma and injuries to the spine, spinal cord, depending on the level in the back of the injury. °· Diabetes. °· Parkinson's Disease. °· Brain and spinal cord tumors. °· Birth defects that affect the brain or spinal cord. °· Guillain-Barré syndrome. °· Complications of surgery involving the spine, spinal cord, or pelvis. °SYMPTOMS  °The following are the most common problems and symptoms of neurogenic bladder. However, each individual may experience symptoms differently.  °· Urinary tract infection symptoms: °¨ Pain or burning when urinating. °¨ Back or flank pain. °¨ Cloudy or bloody urine. °¨ Fever. °· Kidney stone symptoms. Symptoms of kidney stones include: °¨ Chills. °¨ Shivering. °¨ Feeling sick to your stomach (nausea) and/or vomiting. °¨ Fever. °· Loss of bladder control (urinary  incontinence). °· Small urine volume when emptying bladder (voiding). °· Urinary frequency and urgency. °· Dribbling urine. °· Loss of sensation of bladder fullness. °· Kidney failure. °· Infection in the blood stream (sepsis). °DIAGNOSIS  °When neurogenic bladder is suspected, both the nervous system and the bladder are examined. In addition to reviewing your complete medical history and having a physical exam, diagnostic procedures for neurogenic bladder may include: °· X-rays of the spine. °· Imaging tests of the kidneys, ureters, and bladder. These tests may include: °¨ MRI. °¨ CT scan. °¨ Ultrasound. °· Urodynamics or Cystometrogram (CMG). Thistests the nerves and muscles of the bladder. The test can show how much the bladder can hold and if it empties completely. °· EMG of the sphincter . This is measure of the electrical activity of the sphincter muscle and this helps determine when the sphincter is contracting or squeezing down. °· Video studies of the bladder and sphincter while you are urinating. °· Cystoscopy . Looking inside the bladder with a telescope like instrument. °TREATMENT  °Specific treatment for neurogenic bladder will be determined by your caregiver based on: °· Your age, overall health, and medical history. °· Severity of symptoms. °· Cause of the nerve damage. °· Type of bladder problem present. °· Your tolerance for specific medications, procedures, or therapies. °· Expectations for the course of the condition. °· Your opinion or preference. °Treatment may include: °· Antibiotic medicine. °· Medications. °· Insertion of a flexible tube (catheter) to empty the bladder. °· Surgery to create an artificial sphincterto prevent urinary leakage. °· Sacral nerve stimulation (SNS) to   help stimulate the nerves in order to empty the bladder. °· Sling surgery to hold the neck of the bladder and urethra in the proper position to prevent leakage. °· Bladder augmentation. °· Ileal loop surgery to connect  a section of intestine to the ureters and positioned out to a opening on the abdomen. Urine comes out and can be collected into a plastic bag. °· Perineal pads may be used to catch urine. °HOME CARE INSTRUCTIONS  °· Take all medications as prescribed by your caregiver. °· Review your medications (both prescription and non-prescription) with your caregiver to make sure medications you are presently taking will not be harmful. °· Periodic blood, urine, and imaging tests may be required. Follow your caregiver's advice regarding the timing of these. °· Follow the catheter care instructions if you use a catheter. °· Use disposiable underwear if you have bladder weakness. °SEEK MEDICAL CARE IF:  °· You have increasing fatigue or weakness. °· You find there is less and less control of the urine stream despite treatment. °· You develop a loss of appetite or nausea. °· You begin to have trouble inserting the catheter. °· Your urine appears somewhat dark, cloudy, or has an unusual smell. °SEEK IMMEDIATE MEDICAL CARE IF:  °· You develop worsening abdominal or back pain. °· You cannot pass any urine. °· Your urine becomes bloody. °· You experience a lot of burning while urinating. °· You have a fever. °· You keep vomiting. °· You have bleeding with catheter insertion. °Document Released: 01/24/2007 Document Revised: 06/29/2012 Document Reviewed: 10/24/2013 °ExitCare® Patient Information ©2015 [employer]. This information is not intended to replace advice given to you by your health care provider. Make sure you discuss any questions you have with your health care provider. ° °

## 2014-03-09 ENCOUNTER — Encounter: Payer: Self-pay | Admitting: Internal Medicine

## 2014-03-29 ENCOUNTER — Ambulatory Visit: Payer: BC Managed Care – PPO | Admitting: Family Medicine

## 2014-05-07 ENCOUNTER — Other Ambulatory Visit: Payer: Self-pay

## 2014-05-07 MED ORDER — BUPROPION HCL ER (SR) 150 MG PO TB12: ORAL_TABLET | ORAL | Status: DC

## 2014-05-07 NOTE — Telephone Encounter (Signed)
Mr Jennifer [name] left v/m; [facility]; Mr Jennifer [name] request refill done today and pts husband request cb.

## 2014-05-23 ENCOUNTER — Telehealth: Payer: Self-pay | Admitting: Family Medicine

## 2014-05-23 NOTE — Telephone Encounter (Signed)
Patient Information:  Caller Name: Roe CoombsDon  Phone: [phone]  Patient: Tasia CatchingsBoyer, Jerita M  Gender: Female  DOB: 22-Jul-1961  Age: 53 Years  PCP: Ruthe MannanAron, Talia [facility]  Pregnant: No  Office Follow Up:  Does the office need to follow up with this patient?: Yes  Instructions For The Office: Please follow up related to request for pain medication.   Symptoms  Reason For Call & Symptoms: Spouse calling about recent falls.  She has pain from same and [facility] to [facility] up with PCP.  Advised see Today or Tomorrow in Office per Nursing Judgment and No Protocol Available - Sick Adult guideline. Caller asks if Rx can be ordered in without her being seen.  Reviewed Health History In EMR: Yes  Reviewed Medications In EMR: Yes  Reviewed Allergies In EMR: Yes  Reviewed Surgeries / Procedures: Yes  Date of Onset of Symptoms: 05/21/2014  Treatments Tried: Ibuprofen  Treatments Tried Worked: No OB / GYN:  LMP: Unknown  Guideline(s) Used:  No Protocol Available - Sick Adult  Disposition Per Guideline:   See Today or Tomorrow in Office  Reason For Disposition Reached:   Nursing judgment  Advice Given:  Call Back If:  New symptoms develop  You become worse.  Patient Refused Recommendation:  Patient Requests Prescription  Requests pain medication related to falls.

## 2014-05-25 ENCOUNTER — Telehealth: Payer: Self-pay

## 2014-05-25 NOTE — Telephone Encounter (Signed)
Mr Jennifer [name] left v/m requesting med for urinary tract infection due to meds pt takes. Pt has ALS and cannot come in for appt. No further symptoms given. Tried all contact #s and could not speak with anyone.Please advise.  [facility]

## 2014-05-25 NOTE — Telephone Encounter (Signed)
Mr Capek called back and advised as noted. Mr Shella SpearingBoyer is in [place] today and scheduled appt on 05/28/14 for pt. Sat Clinic was offered [facility]. Advised if pt condition changes or worsens prior to appt to go to [facility]. Mr Shella SpearingBoyer voiced understanding.

## 2014-05-25 NOTE — Telephone Encounter (Signed)
She would need to be seen for this. Issues could be due to ALS versus infection. Would not want to give antibiotic if not infectious origin.

## 2014-05-25 NOTE — Telephone Encounter (Signed)
Mr Shella SpearingBoyer said no one had called him back from pain med request on 05/23/14 and he would like cb today.

## 2014-05-25 NOTE — Telephone Encounter (Signed)
I think she should discuss at her upcoming appt. Looking back, has not been seen for falls.

## 2014-05-25 NOTE — Telephone Encounter (Signed)
Pt has an OV scheduled for 05/29/2014 to see Dr Dayton MartesAron to f/u and UTI

## 2014-05-28 ENCOUNTER — Ambulatory Visit: Payer: BC Managed Care – PPO | Admitting: Internal Medicine

## 2014-05-29 ENCOUNTER — Ambulatory Visit: Payer: BC Managed Care – PPO | Admitting: Family Medicine

## 2014-06-07 ENCOUNTER — Ambulatory Visit (INDEPENDENT_AMBULATORY_CARE_PROVIDER_SITE_OTHER): Payer: BC Managed Care – PPO | Admitting: Family Medicine

## 2014-06-07 ENCOUNTER — Encounter: Payer: Self-pay | Admitting: Family Medicine

## 2014-06-07 VITALS — BP 126/72 | HR 75 | Temp 97.5°F

## 2014-06-07 DIAGNOSIS — M62838 Other muscle spasm: Secondary | ICD-10-CM

## 2014-06-07 DIAGNOSIS — G1221 Amyotrophic lateral sclerosis: Secondary | ICD-10-CM

## 2014-06-07 DIAGNOSIS — R5383 Other fatigue: Secondary | ICD-10-CM | POA: Insufficient documentation

## 2014-06-07 DIAGNOSIS — Z23 Encounter for immunization: Secondary | ICD-10-CM

## 2014-06-07 DIAGNOSIS — R5382 Chronic fatigue, unspecified: Secondary | ICD-10-CM

## 2014-06-07 LAB — CBC WITH DIFFERENTIAL/PLATELET
BASOS ABS: 0 10*3/uL (ref 0.0–0.1)
BASOS PCT: 0.3 % (ref 0.0–3.0)
EOS ABS: 0.1 10*3/uL (ref 0.0–0.7)
Eosinophils Relative: 1.3 % (ref 0.0–5.0)
HCT: 35.6 % — ABNORMAL LOW (ref 36.0–46.0)
Hemoglobin: 11.9 g/dL — ABNORMAL LOW (ref 12.0–15.0)
Lymphocytes Relative: 16.3 % (ref 12.0–46.0)
Lymphs Abs: 1.7 10*3/uL (ref 0.7–4.0)
MCHC: 33.4 g/dL (ref 30.0–36.0)
MCV: 95.1 fl (ref 78.0–100.0)
Monocytes Absolute: 0.4 10*3/uL (ref 0.1–1.0)
Monocytes Relative: 3.9 % (ref 3.0–12.0)
NEUTROS PCT: 78.2 % — AB (ref 43.0–77.0)
Neutro Abs: 8.4 10*3/uL — ABNORMAL HIGH (ref 1.4–7.7)
Platelets: 255 10*3/uL (ref 150.0–400.0)
RBC: 3.74 Mil/uL — AB (ref 3.87–5.11)
RDW: 12.5 % (ref 11.5–15.5)
WBC: 10.7 10*3/uL — ABNORMAL HIGH (ref 4.0–10.5)

## 2014-06-07 LAB — VITAMIN B12: Vitamin B-12: 275 pg/mL (ref 211–911)

## 2014-06-07 LAB — VITAMIN D 25 HYDROXY (VIT D DEFICIENCY, FRACTURES): VITD: 44.42 ng/mL (ref 30.00–100.00)

## 2014-06-07 LAB — TSH: TSH: 3.23 u[IU]/mL (ref 0.35–4.50)

## 2014-06-07 MED ORDER — DIAZEPAM 10 MG PO TABS: ORAL_TABLET | ORAL | Status: DC

## 2014-06-07 NOTE — Progress Notes (Signed)
Subjective:   Patient ID: Sula RumpleKatherine Corkern, female    DOB: [birth year], 53 y.o.   MRN: [id number]  Sula RumpleKatherine Chelf is a pleasant 53 y.o. year old female with h/o ALS who presents to clinic today with her husband for Leg Pain  on 06/07/2014  HPI: Followed by Dr. Zannie KehrBedlack at [facility].  Has been having more falls and balance issues lately.   Also very painful leg spasms.  Dr. Zannie KehrBedlack asked her to come see me for this. Could not tolerate oxycodone, hydrocodone--makes her very jittery and she does not want to take any codeine derivatives.  Had similar rxn to Tramadol. Baclofen has been ineffective.  Valium seems to help the most but she feels she is developing a tolerance.  Valium does not make her sleepy and she feels it has not increased her falls.  Patient Active Problem List   Diagnosis Date Noted  . Fatigue 06/07/2014  . Muscle spasm 06/07/2014  . Generalized dystonia 10/24/2013  . Allergic reaction 10/24/2013  . Radicular pain of right lower back 08/21/2013  . Amyotrophic lateral sclerosis 05/23/2012  . ALS (amyotrophic lateral sclerosis) 09/28/2011  . 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 DYSARTHRIA 07/04/2010  . OTHER DYSPHAGIA 12/03/2009  . DEPRESSION 11/07/2008  . BACK PAIN, LUMBAR 08/24/2007  . HYPERLIPIDEMIA 08/03/2007  . TOBACCO ABUSE 08/03/2007  . ELEVATED BLOOD PRESSURE WITHOUT DIAGNOSIS OF HYPERTENSION 08/03/2007  . LUMBAR STRAIN 08/03/2007   No past medical history on file. Past Surgical History  Procedure Laterality Date  . Cesarean section     History  Substance Use Topics  . Smoking status: Former Games developermoker  . Smokeless tobacco: Not on file  . Alcohol Use: Not on file   Family History  Problem Relation Age of Onset  . COPD Mother   . Thyroid disease Mother   . Diverticulosis Mother   . Heart attack Mother   . Gout Father   . Depression Sister   . Thyroid disease Sister   . Cancer Brother     kidney  . Heart attack Maternal  Grandmother   . Heart attack Maternal Grandfather   . Heart attack Paternal Grandfather   . Hyperlipidemia Sister   . Evelene CroonWolff Parkinson White syndrome Sister 25  . Depression Brother   . Hypertension Brother    Allergies  Allergen Reactions  . Lipitor [Atorvastatin]     Pt sts muscle discomfort  . Oxycodone   . Prednisone     psychosis  . Vicodin [Hydrocodone-Acetaminophen]    Current Outpatient Prescriptions on File Prior to Visit  Medication Sig Dispense Refill  . buPROPion (WELLBUTRIN SR) 150 MG 12 hr tablet TAKE 1 TABLET (150 MG TOTAL) BY MOUTH 4 (FOUR) TIMES DAILY. 120 tablet 1  . Dextromethorphan-Quinidine (NUEDEXTA) 20-10 MG CAPS Take 1 capsule by mouth every 12 (twelve) hours.    . fluticasone (FLONASE) 50 MCG/ACT nasal spray USE 2 SPRAYS IN EACH NOSTRIL EVERY DAY AS DIRECTED 16 g 5  . LevETIRAcetam (KEPPRA PO) Take 500 mg by mouth. 2 tablets in the morning and 2 tablets at bedtime    . loratadine (CLARITIN) 10 MG tablet Take one tablet by mouth daily as needed    . NON FORMULARY Take 20 mg by mouth 2 (two) times daily.    [name]. omeprazole (PRILOSEC) 40 MG capsule Take 40 mg by mouth daily.    . ranitidine (ZANTAC) 150 MG capsule Take 150 mg by mouth daily.     No current facility-administered  medications on file prior to visit.   The PMH, PSH, Social History, Family History, Medications, and allergies have been [facility], and have been updated if relevant.    Review of Systems See HPI + muscle spasms + Increased falls      Objective:    BP 126/72 mmHg  Pulse 75  Temp(Src) 97.5 F (36.4 C) (Oral)  Wt   SpO2 99%   Physical Exam Gen:  Alert, dysarthric speech, progressed since last OV Neuro: In wheelchair Psych:  Good eye contact.  Not anxious or depressed appearing     Assessment & Plan:   Muscle spasm - Plan: CBC with Differential, Vitamin D, 25-hydroxy, Vitamin B12, TSH  Need for influenza vaccination - Plan: Flu Vaccine QUAD 36+ mos PF IM  (Fluarix Quad PF)  Chronic fatigue  ALS (amyotrophic lateral sclerosis) No Follow-up on file.

## 2014-06-07 NOTE — Patient Instructions (Signed)
Great to see you. Please update me with how valium is working. We will call you with your lab results and you can view them online.

## 2014-06-07 NOTE — Assessment & Plan Note (Signed)
Deteriorated. >25 minutes spent in face to face time with patient, >50% spent in counselling or coordination of care. Has developed some tolerance to Valium 5 mg daily- will increased dose to 5-10 mg q 6 hours as needed for spasms. If pain worsens, consider a trial of a morphine derivative like dilaudid. The patient indicates understanding of these issues and agrees with the plan.

## 2014-06-07 NOTE — Progress Notes (Signed)
Pre visit review using our clinic review tool, if applicable. No additional management support is needed unless otherwise documented below in the visit note. 

## 2014-06-15 ENCOUNTER — Telehealth: Payer: Self-pay

## 2014-06-15 NOTE — Telephone Encounter (Signed)
Ok thanks so much 

## 2014-06-15 NOTE — Telephone Encounter (Signed)
Called Alexis GoodellBr Ishii to discuss his wife's needs. After conversation he is actually looking for several hours of care during the day for his wife, not just temporary Home PT etc. I told Mr Shella SpearingBoyer that her ALS Dr would probably be better with that type of information as they deal with this every day. He agreed and will call Dr Macario CarlsBedlac and see what he recommends that they can do. He will call us back and let you know. With your office note I think you could order Home PT b/c you documented her balance issues and falls but right now you dont need to put an order in.

## 2014-06-15 NOTE — Telephone Encounter (Signed)
Mr Shella SpearingBoyer left v/m; pt needs daily nursing care at home; Mr Shella SpearingBoyer request order for home health care due to pt needing daily nursing care. Pt was seen 06/07/14 and Mr Shella SpearingBoyer wants to know if pt would have to come in for appt or could this be done by phone. Mr Shella SpearingBoyer request cb.

## 2014-06-15 NOTE — Telephone Encounter (Signed)
Shirlee LimerickMarion, does she need a face to face for this?  Her ALS may need to do this order, not sure.

## 2014-07-03 ENCOUNTER — Other Ambulatory Visit: Payer: Self-pay | Admitting: *Deleted

## 2014-07-03 MED ORDER — BUPROPION HCL ER (SR) 150 MG PO TB12: ORAL_TABLET | ORAL | Status: DC

## 2014-07-21 ENCOUNTER — Telehealth: Payer: Self-pay | Admitting: Internal Medicine

## 2014-07-21 MED ORDER — DIAZEPAM 5 MG PO TABS: 5.0000 mg | ORAL_TABLET | Freq: Two times a day (BID) | ORAL | Status: DC | PRN

## 2014-07-21 NOTE — Telephone Encounter (Signed)
Phone call from RN on [facility] [place] Ran out of diazepam and won't be home till Monday Asks for an emergency supply  Will approve 5mg   1 tid prn #8 x 0 [facility] [phone] Rx printed to be mailed

## 2014-07-26 ENCOUNTER — Other Ambulatory Visit: Payer: Self-pay | Admitting: *Deleted

## 2014-07-26 MED ORDER — FLUTICASONE PROPIONATE 50 MCG/ACT NA SUSP: NASAL | Status: DC

## 2014-07-31 ENCOUNTER — Other Ambulatory Visit: Payer: Self-pay | Admitting: *Deleted

## 2014-07-31 MED ORDER — DIAZEPAM 10 MG PO TABS: ORAL_TABLET | ORAL | Status: DC

## 2014-07-31 NOTE — Telephone Encounter (Signed)
Pt requesting medication refill. Pt has only had acute visits. pls advise

## 2014-08-01 NOTE — Telephone Encounter (Signed)
Rx called in to requested pharmacy 

## 2014-08-02 ENCOUNTER — Telehealth: Payer: Self-pay | Admitting: *Deleted

## 2014-08-02 NOTE — Telephone Encounter (Signed)
[facility] - Client TELEPHONE ADVICE RECORD [facility] Patient Name: Jennifer RumpleKATHERINE [name] Gender: Female DOB: Jun 05, 1961 Age: 54 Y 9 M 17 D Return Phone Number: [phone] (Primary) Address: City[place]State[place]: [place] [id number] Client [facility] - Client Client Site [facility] - Day Physician Ruthe MannanAron, Talia Contact Type Call Call Type Triage / Clinical Caller Name Mr Shella SpearingBoyer Relationship To Patient Spouse Appointment Disposition EMR Appointment Scheduled Return Phone Number [phone] (Primary) Chief Complaint Depression Initial Comment Caller states wife's medication is not helping pt anymore for depression PreDisposition InappropriateToAsk Nurse Assessment Nurse: Elliot GurneyWoody, RN, Hulan SaasLee Anne Date/Time Lamount Cohen(Eastern Time): 08/02/2014 12:06:35 PM Confirm and document reason for call. If symptomatic, describe symptoms. ---Caller states wife's medication is not helping pt anymore for depression. Caller is not with pt. States he has someone sitting with pt and he states pt is having self harm thoughts. Caller does not have a plan. Takes wellbutrin maximum dosage and has been on it for years. Increasing depression going on off and on for about a week. Pt has ALS and has to have help with routine daily activities. Has the patient traveled out of the country within the last 30 days? ---Not Applicable Does the patient require triage? ---Yes Related visit to physician within the last 2 weeks? ---No Does the PT have any chronic conditions? (i.e. diabetes, asthma, etc.) ---Yes List chronic conditions. ---ALS, depression Did the patient indicate they were pregnant? ---No Guidelines Guideline Title Affirmed Question Affirmed Notes Nurse Date/Time (Eastern Time) Depression [1] Depression AND [2] worsening (e.g.,sleeping poorly, less able to do activities of daily living) Elliot GurneyWoody, Charity fundraiserN, Hulan SaasLee Anne 08/02/2014  12:08:23 PM Disp. Time Lamount Cohen(Eastern Time) Disposition Final User 08/02/2014 12:14:12 PM See Physician within 24 Hours Yes Elliot GurneyWoody, RN, Hulan SaasLee Anne PLEASE NOTE: All timestamps contained within this report are represented as [place] Standard Time. CONFIDENTIALTY NOTICE: This fax transmission is intended only for the addressee. It contains information that is legally privileged, confidential or otherwise protected from use or disclosure. If you are not the intended recipient, you are strictly prohibited from reviewing, disclosing, copying using or disseminating any of this information or taking any action in reliance on or regarding this information. If you have received this fax in error, please notify us immediately by telephone so that we can arrange for its return to us. Phone: [phone], Toll-Free: [phone], Fax: [phone] Page: 2 of 2 Call Id: [id number] Caller Understands: Yes Disagree/Comply: Comply Care Advice Given Per Guideline SEE PHYSICIAN WITHIN 24 HOURS:  IF OFFICE WILL BE OPEN: You need to be examined within the next 24 hours. Call your doctor when the office opens, and make an appointment. CARE ADVICE given per Depression (Adult) guideline. After Care Instructions Given Call Event Type User Date / Time Description Comments User: Hulan SaasLee Anne, Elliot GurneyWoody, RN Date/Time Lamount Cohen(Eastern Time): 08/02/2014 12:15:01 PM Warm transferred caller to office to schedule appt Referrals REFERRED TO PCP OFFICE

## 2014-08-02 NOTE — Telephone Encounter (Signed)
Per Epic pt scheduled appt with you for 08/06/14.

## 2014-08-06 ENCOUNTER — Ambulatory Visit (INDEPENDENT_AMBULATORY_CARE_PROVIDER_SITE_OTHER): Payer: BLUE CROSS/BLUE SHIELD | Admitting: Family Medicine

## 2014-08-06 ENCOUNTER — Encounter: Payer: Self-pay | Admitting: Family Medicine

## 2014-08-06 VITALS — BP 130/66 | HR 75 | Temp 97.5°F

## 2014-08-06 DIAGNOSIS — G1221 Amyotrophic lateral sclerosis: Secondary | ICD-10-CM

## 2014-08-06 DIAGNOSIS — F32A Depression, unspecified: Secondary | ICD-10-CM

## 2014-08-06 DIAGNOSIS — F329 Major depressive disorder, single episode, unspecified: Secondary | ICD-10-CM

## 2014-08-06 MED ORDER — SERTRALINE HCL 25 MG PO TABS: 25.0000 mg | ORAL_TABLET | Freq: Every day | ORAL | Status: DC

## 2014-08-06 NOTE — Progress Notes (Signed)
Subjective:   Patient ID: Jennifer [name], female    DOB: August 07, 1960, 54 y.o.   MRN: [id number]  Jennifer [name] is a pleasant 54 y.o. year old female with h/o ALS who presents to clinic today with her husband for worsening Depression  on 08/06/2014  HPI: Depression- has been on Wellbutrin 150 mg four tablets daily for a long time.  She and her ALS doctor both feel it is no longer managing her symptoms properly.  She is more anxious about her loss of independence, more tearful.  She has less patience.  She has been smoking marijuana which helps for a short period of time.  Not sleeping well- valium does not make her tired- it does help more with her muscle spasm.  Current Outpatient Prescriptions on File Prior to Visit  Medication Sig Dispense Refill  . buPROPion (WELLBUTRIN SR) 150 MG 12 hr tablet TAKE 1 TABLET (150 MG TOTAL) BY MOUTH 4 (FOUR) TIMES DAILY. 120 tablet 1  . Dextromethorphan-Quinidine (NUEDEXTA) 20-10 MG CAPS Take 1 capsule by mouth every 12 (twelve) hours.    . diazepam (VALIUM) 10 MG tablet 1/2 to 1 tab by mouth every 6 hours as needed for muscle cramps 60 tablet 1  . fluticasone (FLONASE) 50 MCG/ACT nasal spray USE 2 SPRAYS IN EACH NOSTRIL EVERY DAY AS DIRECTED 16 g 5  . LevETIRAcetam (KEPPRA PO) Take 500 mg by mouth. 2 tablets in the morning and 2 tablets at bedtime    . loratadine (CLARITIN) 10 MG tablet Take one tablet by mouth daily as needed    . NON FORMULARY Take 20 mg by mouth 2 (two) times daily.    [name] omeprazole (PRILOSEC) 40 MG capsule Take 40 mg by mouth daily.    . ranitidine (ZANTAC) 150 MG capsule Take 150 mg by mouth daily.     No current facility-administered medications on file prior to visit.    Allergies  Allergen Reactions  . Lipitor [Atorvastatin]     Pt sts muscle discomfort  . Oxycodone   . Prednisone     psychosis  . Vicodin [Hydrocodone-Acetaminophen]     No past medical history on file.  Past Surgical History  Procedure  Laterality Date  . Cesarean section      Family History  Problem Relation Age of Onset  . COPD Mother   . Thyroid disease Mother   . Diverticulosis Mother   . Heart attack Mother   . Gout Father   . Depression Sister   . Thyroid disease Sister   . Cancer Brother     kidney  . Heart attack Maternal Grandmother   . Heart attack Maternal Grandfather   . Heart attack Paternal Grandfather   . Hyperlipidemia Sister   . Evelene Croon Parkinson White syndrome Sister 25  . Depression Brother   . Hypertension Brother     History   Social History  . Marital Status: Married    Spouse Name: N/A    Number of Children: 1  . Years of Education: N/A   Occupational History  . home    Social History Main Topics  . Smoking status: Former Games developer  . Smokeless tobacco: Not on file  . Alcohol Use: Not on file  . Drug Use: Not on file  . Sexual Activity: Not on file   Other Topics Concern  . Not on file   Social History Narrative   Daughter has severe reflux since infancy   The PMH, PSH, Social History, Family History,  Medications, and allergies have been reviewed in [facility], and have been updated if relevant.   Review of Systems  Eyes: Negative.   Neurological: Negative for dizziness.  Psychiatric/Behavioral: Positive for sleep disturbance and dysphoric mood. Negative for suicidal ideas, hallucinations, behavioral problems, confusion, self-injury and agitation. The patient is nervous/anxious. The patient is not hyperactive.   All other systems reviewed and are negative.      Objective:    BP 130/66 mmHg  Pulse 75  Temp(Src) 97.5 F (36.4 C) (Oral)  Wt   SpO2 97%   Physical Exam  Constitutional: She is oriented to person, place, and time. She appears well-developed and well-nourished.  Speech more difficult to understand than at previous visits  HENT:  Head: Normocephalic.  Neurological: She is alert and oriented to person, place, and time.  Sitting in wheelchair  Skin: Skin is  warm and dry.  Psychiatric: She has a normal mood and affect. Her behavior is normal. Judgment and thought content normal.          Assessment & Plan:   ALS (amyotrophic lateral sclerosis)  Depression No Follow-up on file.

## 2014-08-06 NOTE — Progress Notes (Signed)
Pre visit review using our clinic review tool, if applicable. No additional management support is needed unless otherwise documented below in the visit note. 

## 2014-08-06 NOTE — Patient Instructions (Signed)
Great to see you. I am so sorry you are having a tough time- Start to wean off of Wellbutrin like we discussed today- For example- 3 tablets for 3 days and 2 tabs daily for 3 days, 1 tablet daily for three days- 1 tablet every other for a few days.  Go ahead and start zoloft 25 mg daily nightly.  Please call me with an update.

## 2014-08-06 NOTE — Assessment & Plan Note (Signed)
Progressing- unfortunately I have noticed progression in presentation from when I last saw her in 05/2014.

## 2014-08-06 NOTE — Assessment & Plan Note (Signed)
Deteriorated. >25 minutes spent in face to face time with patient, >50% spent in counselling or coordination of care She is declining psychotherapy.  Has a great support system at home but this is understandibly a very anxiety provoking and frustrating disease for her. Will wean down wellbutrin- discussed how to do this. Also start zoloft 25 mg daily.  She will call me in 2 weeks with an update. The patient indicates understanding of these issues and agrees with the plan.

## 2014-08-13 ENCOUNTER — Telehealth: Payer: Self-pay

## 2014-08-13 NOTE — Telephone Encounter (Signed)
Ok to increase- start out with taking two 25 mg tablets daily and if effective, we can send in new rx for zoloft 50 mg daily.

## 2014-08-13 NOTE — Telephone Encounter (Signed)
Mr Shella SpearingBoyer left v/m; pt was seen on 08/06/14 and pt taking zoloft 25 mg taking one tab at hs; Mr Shella SpearingBoyer reports zoloft is helping but thinks dosage needs to be increased to help pt even more. [facility] Shella [facility]aringBoyer [facility] DPR signed to speak with Mr Shella SpearingBoyer.

## 2014-08-14 NOTE — Telephone Encounter (Signed)
Spoke to pts husband and advised per Dr Dayton MartesAron; verbally expressed understanding and states he will cb in a week or so with update

## 2014-08-24 MED ORDER — BENZONATATE 100 MG PO CAPS: 100.0000 mg | ORAL_CAPSULE | Freq: Two times a day (BID) | ORAL | Status: DC | PRN

## 2014-08-24 MED ORDER — SERTRALINE HCL 50 MG PO TABS: 50.0000 mg | ORAL_TABLET | Freq: Every day | ORAL | Status: DC

## 2014-08-24 NOTE — Addendum Note (Signed)
Addended by: Dianne DunARON, Kaylon Hitz M on: 08/24/2014 12:27 PM   Modules accepted: Orders

## 2014-08-24 NOTE — Telephone Encounter (Addendum)
Mr Jennifer [name] left v/m requesting new rx for sertraline 50 mg taking 1 [facility]Please advise.

## 2014-08-24 NOTE — Telephone Encounter (Addendum)
Mr Jennifer [name] called back and pt is out of sertraline today and request new rx for sertraline 50 mg be sent in today and pt is requesting benzonatate 100 mg caps for coughing; Mr Jennifer [name] said pt has spoken with doctor about this before. Don't see benzonatate on med list. Mr Jennifer [name] request cb.

## 2014-08-24 NOTE — Addendum Note (Signed)
Addended by: Desmond DikeKNIGHT, Lorine Iannaccone H on: 08/24/2014 12:21 PM   Modules accepted: Orders, Medications

## 2014-08-24 NOTE — Telephone Encounter (Signed)
Rx sent for zoloft change and med list update. pls advise about tessalon. Not on pts current or Hx med list

## 2014-08-24 NOTE — Telephone Encounter (Signed)
Thank you.  [facility]

## 2014-08-31 ENCOUNTER — Other Ambulatory Visit: Payer: Self-pay | Admitting: Family Medicine

## 2014-08-31 ENCOUNTER — Telehealth: Payer: Self-pay

## 2014-08-31 NOTE — Telephone Encounter (Signed)
Mr Shella SpearingBoyer left v/m; pt recently increased zoloft from 25 mg to 50 mg; pt has been off wellbutrin for couple of weeks.Spoke with Mr Shella SpearingBoyer and pt has been upset more recently about not getting attention from pts daughter and pts husband. Mr Shella SpearingBoyer said pt is blowing concerns out of proportion and pt is mad at her daughter and husband and pts husband wants to know if zoloft dosage can be increased or what to do.No SI/HI. Mr Shella SpearingBoyer [facility]

## 2014-08-31 NOTE — Telephone Encounter (Signed)
I am sorry to hear she is having a tough time. He can certainly try to increase her dose of zoloft to 100 mg daily- give her two of her 50 mg tablets or add Wellbutrin back on, but unfortunately this may not be sufficient.  Beyond this, this is getting to be outside my scope of practice and I would suggest she see a specialist, a psychiatrist.

## 2014-08-31 NOTE — Telephone Encounter (Signed)
Spoke to pts husband and advised per Dr Dayton MartesAron. States that he will have to speak with pt to see if she is wanting to increase zoloft or add Wellbutrin back.

## 2014-09-15 ENCOUNTER — Emergency Department (HOSPITAL_COMMUNITY)
Admission: EM | Admit: 2014-09-15 | Discharge: 2014-09-16 | Disposition: A | Discharge status: Home / Self Care | Payer: BLUE CROSS/BLUE SHIELD | Attending: Emergency Medicine | Admitting: Emergency Medicine

## 2014-09-15 ENCOUNTER — Encounter (HOSPITAL_COMMUNITY): Payer: Self-pay | Admitting: Emergency Medicine

## 2014-09-15 DIAGNOSIS — F1210 Cannabis abuse, uncomplicated: Secondary | ICD-10-CM | POA: Diagnosis not present

## 2014-09-15 DIAGNOSIS — G1221 Amyotrophic lateral sclerosis: Secondary | ICD-10-CM | POA: Insufficient documentation

## 2014-09-15 DIAGNOSIS — Z79899 Other long term (current) drug therapy: Secondary | ICD-10-CM | POA: Insufficient documentation

## 2014-09-15 DIAGNOSIS — F329 Major depressive disorder, single episode, unspecified: Secondary | ICD-10-CM | POA: Diagnosis not present

## 2014-09-15 DIAGNOSIS — F332 Major depressive disorder, recurrent severe without psychotic features: Secondary | ICD-10-CM | POA: Diagnosis present

## 2014-09-15 DIAGNOSIS — Z87891 Personal history of nicotine dependence: Secondary | ICD-10-CM | POA: Insufficient documentation

## 2014-09-15 DIAGNOSIS — F1310 Sedative, hypnotic or anxiolytic abuse, uncomplicated: Secondary | ICD-10-CM | POA: Diagnosis not present

## 2014-09-15 DIAGNOSIS — F32A Depression, unspecified: Secondary | ICD-10-CM

## 2014-09-15 HISTORY — DX: Amyotrophic lateral sclerosis: G12.21

## 2014-09-15 HISTORY — DX: Major depressive disorder, single episode, unspecified: F32.9

## 2014-09-15 HISTORY — DX: Depression, unspecified: F32.A

## 2014-09-15 LAB — COMPREHENSIVE METABOLIC PANEL
ALT: 13 U/L (ref 0–35)
AST: 16 U/L (ref 0–37)
Albumin: 4.5 g/dL (ref 3.5–5.2)
Alkaline Phosphatase: 45 U/L (ref 39–117)
Anion gap: 7 (ref 5–15)
BUN: 11 mg/dL (ref 6–23)
CO2: 27 mmol/L (ref 19–32)
CREATININE: 0.58 mg/dL (ref 0.50–1.10)
Calcium: 9.3 mg/dL (ref 8.4–10.5)
Chloride: 107 mmol/L (ref 96–112)
GFR calc Af Amer: >90 mL/min (ref >90)
GFR calc non Af Amer: >90 mL/min (ref >90)
Glucose, Bld: 95 mg/dL (ref 70–99)
Potassium: 3.9 mmol/L (ref 3.5–5.1)
Sodium: 141 mmol/L (ref 135–145)
Total Bilirubin: 0.4 mg/dL (ref 0.3–1.2)
Total Protein: 7.4 g/dL (ref 6.0–8.3)

## 2014-09-15 LAB — CBC
HCT: 38.4 % (ref 36.0–46.0)
Hemoglobin: 13.3 g/dL (ref 12.0–15.0)
MCH: 31.3 pg (ref 26.0–34.0)
MCHC: 34.6 g/dL (ref 30.0–36.0)
MCV: 90.4 fL (ref 78.0–100.0)
Platelets: 219 10*3/uL (ref 150–400)
RBC: 4.25 MIL/uL (ref 3.87–5.11)
RDW: 11.7 % (ref 11.5–15.5)
WBC: 6.8 10*3/uL (ref 4.0–10.5)

## 2014-09-15 LAB — RAPID URINE DRUG SCREEN, HOSP PERFORMED
Amphetamines: NOT DETECTED
BENZODIAZEPINES: POSITIVE — AB
Barbiturates: NOT DETECTED
Cocaine: NOT DETECTED
OPIATES: NOT DETECTED
Tetrahydrocannabinol: POSITIVE — AB

## 2014-09-15 LAB — ETHANOL: Alcohol, Ethyl (B): <5 mg/dL (ref 0–9)

## 2014-09-15 MED ORDER — PANTOPRAZOLE SODIUM 40 MG PO TBEC
40.0000 mg | DELAYED_RELEASE_TABLET | Freq: Every day | ORAL | Status: DC
  Administered 2014-09-16: 40 mg via ORAL
  Filled 2014-09-15: qty 1

## 2014-09-15 MED ORDER — LEVETIRACETAM 500 MG PO TABS
1000.0000 mg | ORAL_TABLET | Freq: Two times a day (BID) | ORAL | Status: DC
  Administered 2014-09-15 – 2014-09-16 (×2): 1000 mg via ORAL
  Filled 2014-09-15 (×3): qty 2

## 2014-09-15 MED ORDER — DIAZEPAM 5 MG PO TABS
5.0000 mg | ORAL_TABLET | Freq: Three times a day (TID) | ORAL | Status: DC | PRN
  Administered 2014-09-15 – 2014-09-16 (×2): 5 mg via ORAL
  Filled 2014-09-15 (×2): qty 1

## 2014-09-15 MED ORDER — DEXTROMETHORPHAN-QUINIDINE 20-10 MG PO CAPS
1.0000 | ORAL_CAPSULE | Freq: Two times a day (BID) | ORAL | Status: DC
  Administered 2014-09-15 – 2014-09-16 (×2): 1 via ORAL

## 2014-09-15 MED ORDER — SERTRALINE HCL 50 MG PO TABS
50.0000 mg | ORAL_TABLET | Freq: Every day | ORAL | Status: DC
  Administered 2014-09-16: 50 mg via ORAL
  Filled 2014-09-15: qty 1

## 2014-09-15 MED ORDER — IBUPROFEN 200 MG PO TABS
400.0000 mg | ORAL_TABLET | Freq: Four times a day (QID) | ORAL | Status: DC | PRN
  Administered 2014-09-15 – 2014-09-16 (×2): 400 mg via ORAL
  Filled 2014-09-15 (×2): qty 2

## 2014-09-15 NOTE — ED Notes (Signed)
TTS bedside 

## 2014-09-15 NOTE — ED Notes (Signed)
Consult wit [facility]TS UTO phlebotomy

## 2014-09-15 NOTE — BHH Counselor (Signed)
Binnie RailJoann Glover, [facility], confirms adult unit is currently at capacity. Contacted the following facilities for placement:  BED AVAILABLE, FAXED CLINICAL INFORMATION: [facility], per [facility], per [facility], per [facility], per [facility]  INFORMATION FAXED, PT UNDER REVIEW: [facility] [place] [facility]  AT CAPACITY: [facility], per Cristobal Goldmannalvin [facility], per [facility], per [facility], per [facility], per [facility] [place], per [facility], per [employer] [facility] [place], per [facility], per [facility], [facility] [facility], per Leticia PennaLarry Brynn [facility], per [facility], per [facility], per [facility], per Judeth CornfieldStephanie  NO RESPONSE: [facility]    [address]Bayli Quesinberry Ellis Patsy BaltimoreWarrick Jr, WisconsinLPC, [facility] Triage Specialist [phone]

## 2014-09-15 NOTE — ED Notes (Signed)
Contact info neighbor [phone], daughter/dad [phone]

## 2014-09-15 NOTE — ED Notes (Signed)
Pt tearful on arrival to [facility]CU.  Friend at bedside.

## 2014-09-15 NOTE — ED Notes (Signed)
Bed: [id number] Expected date: 09/15/14 Expected time: 12:10 PM Means of arrival: Ambulance Comments: ALS/Depression
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
## 2014-09-15 NOTE — ED Notes (Signed)
Pt very confused.  Trying to understand how therapy begins in "our facility".  Explained to patient that she was holding for placement in hospital for true therapy.  Had [place] NP come speak to pt. Plan is for pt to be evaluated in the morning for continual need for placement or for potential d/c home.

## 2014-09-15 NOTE — ED Notes (Signed)
Belongings given to neighbor. Pants, shirt, a pair of shoes, and necklace.  Receiving RN made aware.

## 2014-09-15 NOTE — ED Provider Notes (Addendum)
CSN: [id number]     Arrival date & time 09/15/14  1217 History   First MD Initiated Contact with Patient 09/15/14 1242     Chief Complaint  Patient presents with  . Depression     (Consider location/radiation/quality/duration/timing/severity/associated sxs/prior Treatment) The history is provided by the patient.  pt with hx ALS, depression, c/o worsening depression in the past month. States approximately 1 month ago was changed from Wellbutrin to Zoloft.  States she feels her depression is primarily related to stressors associated with her chronic illness/ALS, and relationship stressors. Denies abuse/injury. Intermittently has had thoughts of self harm, but denies currently. Denies overdose or attempt to hurt self. States physical health at baseline, and functional ability has been stable for months. No fever or chills.    Past Medical History  Diagnosis Date  . ALS (amyotrophic lateral sclerosis)   . Depression    Past Surgical History  Procedure Laterality Date  . Cesarean section     Family History  Problem Relation Age of Onset  . COPD Mother   . Thyroid disease Mother   . Diverticulosis Mother   . Heart attack Mother   . Gout Father   . Depression Sister   . Thyroid disease Sister   . Cancer Brother     kidney  . Heart attack Maternal Grandmother   . Heart attack Maternal Grandfather   . Heart attack Paternal Grandfather   . Hyperlipidemia Sister   . Evelene Croon Parkinson White syndrome Sister 25  . Depression Brother   . Hypertension Brother    History  Substance Use Topics  . Smoking status: Former Games developer  . Smokeless tobacco: Not on file  . Alcohol Use: Not on file   OB History    No data available     Review of Systems  Constitutional: Negative for fever.  HENT: Negative for sore throat.   Eyes: Negative for redness.  Respiratory: Negative for shortness of breath.   Cardiovascular: Negative for chest pain.  Gastrointestinal: Negative for vomiting and  abdominal pain.  Genitourinary: Negative for flank pain.  Musculoskeletal: Negative for back pain and neck pain.  Skin: Negative for rash.  Neurological: Negative for headaches.  Hematological: Does not bruise/bleed easily.  Psychiatric/Behavioral: Positive for dysphoric mood.      Allergies  Lipitor; Oxycodone; Prednisone; and Vicodin  Home Medications   Prior to Admission medications   Medication Sig Start Date End Date Taking? Authorizing Provider  benzonatate (TESSALON) 100 MG capsule Take 1 capsule (100 mg total) by mouth 2 (two) times daily as needed for cough. 08/24/14   [name], MD  buPROPion (WELLBUTRIN SR) 150 MG 12 hr tablet TAKE 1 TABLET (150 MG TOTAL) BY MOUTH 4 (FOUR) TIMES DAILY. 07/03/14   [name], MD  Dextromethorphan-Quinidine (NUEDEXTA) 20-10 MG CAPS Take 1 capsule by mouth every 12 (twelve) hours.    Historical Provider, MD  diazepam (VALIUM) 10 MG tablet 1/2 to 1 tab by mouth every 6 hours as needed for muscle cramps 07/31/14   [name], MD  fluticasone [facility]) 50 MCG/ACT nasal spray USE 2 SPRAYS IN [facility] NOSTRIL EVERY DAY AS DIRECTED 07/26/14   [name], MD  LevETIRAcetam (KEPPRA PO) Take 500 mg by mouth. 2 tablets in the morning and 2 tablets at bedtime    Historical Provider, MD  loratadine (CLARITIN) 10 MG tablet Take one tablet by mouth daily as needed    Historical Provider, MD  NON FORMULARY Take 20 mg  by mouth 2 (two) times daily.    Historical Provider, MD  omeprazole (PRILOSEC) 40 MG capsule Take 40 mg by mouth daily.    Historical Provider, MD  ranitidine (ZANTAC) 150 MG capsule Take 150 mg by mouth daily.    Historical Provider, MD  sertraline (ZOLOFT) 50 MG tablet Take 1 tablet (50 mg total) by mouth daily. 08/24/14   Dianne Dunalia M Aron, MD   BP 165/82 mmHg  Pulse 74  Temp(Src) 98.4 F (36.9 C) (Oral)  Resp 20  SpO2 94% Physical Exam  Constitutional: She appears well-developed and well-nourished. No distress.  HENT:  Mouth/Throat:  Oropharynx is clear and moist.  Eyes: Conjunctivae are normal. No scleral icterus.  Neck: Neck supple. No tracheal deviation present.  Cardiovascular: Normal rate.   Pulmonary/Chest: Effort normal. No respiratory distress.  Abdominal: Normal appearance. She exhibits no distension.  Musculoskeletal: She exhibits no edema.  Neurological: She is alert.  Skin: Skin is warm and dry. No rash noted. She is not diaphoretic.  Psychiatric:  Depressed mood.   Nursing note and vitals reviewed.   ED Course  Procedures (including critical care time) Labs Review   Results for orders placed or performed during the hospital encounter of 09/15/14  CBC  Result Value Ref Range   WBC 6.8 4.0 - 10.5 K/uL   RBC 4.25 3.87 - 5.11 MIL/uL   Hemoglobin 13.3 12.0 - 15.0 g/dL   HCT 16.138.4 09.636.0 - 04.546.0 %   MCV 90.4 78.0 - 100.0 fL   MCH 31.3 26.0 - 34.0 pg   MCHC 34.6 30.0 - 36.0 g/dL   RDW 40.911.7 81.111.5 - 91.415.5 %   Platelets 219 150 - 400 K/uL  Comprehensive metabolic panel  Result Value Ref Range   Sodium 141 135 - 145 mmol/L   Potassium 3.9 3.5 - 5.1 mmol/L   Chloride 107 96 - 112 mmol/L   CO2 27 19 - 32 mmol/L   Glucose, Bld 95 70 - 99 mg/dL   BUN 11 6 - 23 mg/dL   Creatinine, Ser 7.820.58 0.50 - 1.10 mg/dL   Calcium 9.3 8.4 - 95.610.5 mg/dL   Total Protein 7.4 6.0 - 8.3 g/dL   Albumin 4.5 3.5 - 5.2 g/dL   AST 16 0 - 37 U/L   ALT 13 0 - 35 U/L   Alkaline Phosphatase 45 39 - 117 U/L   Total Bilirubin 0.4 0.3 - 1.2 mg/dL   GFR calc non Af Amer >90 >90 mL/min   GFR calc Af Amer >90 >90 mL/min   Anion gap 7 5 - 15  Ethanol  Result Value Ref Range   Alcohol, Ethyl (B) <5 0 - 9 mg/dL      MDM   Labs.  Reviewed nursing notes and prior charts for additional history.   Recheck no change from prior.  Psych team consulted - their eval remains pending.  Disposition per psych team.      Suzi RootsKevin E Bolton Canupp, MD 09/15/14 [phone]

## 2014-09-15 NOTE — ED Notes (Signed)
PER EMS- pt picked up from home c/o depression due to ALS.  Pt reported she was having a nervous breakdown.  Pt is alert and oriented per baseline. Hx of generalized weakness and slurred speech.

## 2014-09-15 NOTE — ED Notes (Signed)
TTS still in room unable to draw
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
## 2014-09-15 NOTE — ED Notes (Signed)
Pt requesting to visit with husband. This RN agreed with pt that it would be permissible but that the visit would have to be no more then 30 minutes. This RN also made an agreement with pt that if the visit was too upsetting that she simply needed to press the call bell and husband would be asked to leave the department.

## 2014-09-16 DIAGNOSIS — F332 Major depressive disorder, recurrent severe without psychotic features: Secondary | ICD-10-CM | POA: Diagnosis not present

## 2014-09-16 NOTE — ED Notes (Signed)
Patient refused to eat breakfast 0%
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
## 2014-09-16 NOTE — ED Notes (Signed)
Patient up to bathroom using walker with assistance.  Family visiting at bedside.

## 2014-09-16 NOTE — [facility] Suicide Risk Assessment (Cosign Needed)
Suicide Risk Assessment  Discharge Assessment   [facility]   Demographic Factors:  Caucasian and Unemployed  Total Time spent with patient: 20 minutes  Musculoskeletal: Strength & Muscle Tone: Poor mobility due to ALS, USES WAIKER Gait & Station: Uses walker, poor mobility Patient leans: uses walker  Psychiatric Specialty Exam:     Blood pressure 132/67, pulse 63, temperature 97.5 F (36.4 C), temperature source Oral, resp. rate 18, SpO2 96 %.There is no weight on file to calculate BMI.  General Appearance: Casual and Fairly Groomed  Eye Contact::  Good  Speech:  Clear and Coherent and Normal Rate409  Volume:  Normal  Mood:  Depressed  Affect:  Congruent and Depressed  Thought Process:  Coherent, Goal Directed and Intact  Orientation:  Full (Time, Place, and Person)  Thought Content:  WDL  Suicidal Thoughts:  No  Homicidal Thoughts:  No  Memory:  Immediate;   Good Recent;   Good Remote;   Good  Judgement:  Good  Insight:  Good  Psychomotor Activity:  uses walker  Concentration:  Good  Recall:  NA  Fund of Knowledge:Good  Language: Garbled speech due to ALS.  Akathisia:  NA  Handed:  Right  AIMS (if indicated):     Assets:  Desire for Improvement  Sleep:     Cognition: WNL  ADL's:  Intact      Has this patient used any form of tobacco in the last 30 days? (Cigarettes, Smokeless Tobacco, Cigars, and/or Pipes) N/A  Mental Status Per Nursing Assessment::   On Admission:     Current Mental Status by Physician: NA  Loss Factors: Decline in physical health  Historical Factors: NA  Risk Reduction Factors:   Responsible for children under 54 years of age, Sense of responsibility to family, Religious beliefs about death, Living with another person, especially a relative and Positive therapeutic relationship  Continued Clinical Symptoms:  Depression:   Insomnia  Cognitive Features That Contribute To Risk:  Closed-mindedness and  Polarized thinking    Suicide Risk:  Minimal: No identifiable suicidal ideation.  Patients presenting with no risk factors but with morbid ruminations; may be classified as minimal risk based on the severity of the depressive symptoms  Principal Problem: Major depressive disorder, recurrent severe without psychotic features Discharge Diagnoses:  Patient Active Problem List   Diagnosis Date Noted  . Major depressive disorder, recurrent severe without psychotic features [F33.2] 09/15/2014    Priority: High  . Fatigue [R53.83] 06/07/2014  . Muscle spasm [M62.838] 06/07/2014  . Generalized dystonia [G24.9] 10/24/2013  . Allergic reaction [T78.40XA] 10/24/2013  . Radicular pain of right lower back [M54.16] 08/21/2013  . Amyotrophic lateral sclerosis [G12.21] 05/23/2012  . ALS (amyotrophic lateral sclerosis) [G12.21] 09/28/2011  . 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 DYSARTHRIA [R47.1] 07/04/2010  . OTHER DYSPHAGIA [R13.19] 12/03/2009  . Depression [F32.9] 11/07/2008  . BACK PAIN, LUMBAR [M54.5] 08/24/2007  . HYPERLIPIDEMIA [E78.2] 08/03/2007  . TOBACCO ABUSE [Z72.0] 08/03/2007  . ELEVATED BLOOD PRESSURE WITHOUT DIAGNOSIS OF HYPERTENSION [R03.0] 08/03/2007  . LUMBAR STRAIN [S33.5XXA] 08/03/2007      Plan Of Care/Follow-up recommendations:  Activity:  AS TOLERATED Diet:  REGULAR  Is patient on multiple antipsychotic therapies at discharge:  No   Has Patient had three or more failed trials of antipsychotic monotherapy by history:  No  Recommended Plan for Multiple Antipsychotic Therapies: NA    Ananya Mccleese, C   PMHNP-BC 09/16/2014, 12:00 PM

## 2014-09-20 ENCOUNTER — Other Ambulatory Visit: Payer: Self-pay | Admitting: Family Medicine

## 2014-09-21 NOTE — Telephone Encounter (Signed)
See previous phone note.  

## 2014-09-22 ENCOUNTER — Other Ambulatory Visit: Payer: Self-pay | Admitting: Family Medicine

## 2014-09-24 NOTE — Telephone Encounter (Signed)
Rx called in to requested pharmacy 

## 2014-09-24 NOTE — Telephone Encounter (Signed)
Pt has not had any f/u appts; acute only

## 2014-09-27 ENCOUNTER — Telehealth: Payer: Self-pay

## 2014-09-27 NOTE — Telephone Encounter (Signed)
Mr Jennifer [name] left v/m requesting cb; [facility] and 08/06/14 saw Dr Dayton MartesAron. Mr Jennifer [name] wants to know how much wellbutrin pt can take with sertraline 50 mg. Mr Jennifer [name] request cb.pt last seen 08/06/2014.

## 2014-09-27 NOTE — Telephone Encounter (Signed)
I would not advise more than 300 mg daily total of Wellbutrin.

## 2014-09-27 NOTE — Telephone Encounter (Signed)
Spoke to pts husband and advised per Dr Dayton MartesAron; verbally expressed understanding.

## 2014-10-16 ENCOUNTER — Telehealth: Payer: Self-pay | Admitting: Family Medicine

## 2014-10-16 NOTE — Telephone Encounter (Signed)
Patient Name: Jennifer [name] DOB: 09-21-1960 Initial Comment caller states his wife is on Zoloft - it is not working for her - would just like to go back to her wellbutrin - how does she need to do this Nurse Assessment Nurse: Yetta BarreJones, RN, Miranda Date/Time (Eastern Time): 10/16/2014 9:25:22 AM Confirm and document reason for call. If symptomatic, describe symptoms. ---Caller states his wife has been taking Zoloft and Wellbutrin for the last couple of weeks. The doctor had tried to switch her from Wellbutrin to Zoloft because pt did not feel that the Wellbutrin was helping as much anymore. Caller states his wife wants to switch back to the Wellbutrin solely. Has the patient traveled out of the country within the last 30 days? ---Not Applicable Does the patient require triage? ---No Please document clinical information provided and list any resource used. ---Told caller I would send a message to the providers and ask that someone call him back.

## 2014-10-16 NOTE — Telephone Encounter (Signed)
She would need to wean off zoloft- 1 tab every other day for 1 week, then 1/2 tab every other day for 1 week and stop if she feels ok.

## 2014-10-16 NOTE — Telephone Encounter (Signed)
Spoke to pts husband and advised per Dr Dayton MartesAron; verbally expressed understanding. States that he will wean pt off of zoloft and continue with the wellbutrin.

## 2014-10-29 ENCOUNTER — Telehealth: Payer: Self-pay | Admitting: *Deleted

## 2014-10-29 NOTE — Telephone Encounter (Signed)
PLEASE NOTE: All timestamps contained within this report are represented as [place] Standard Time. CONFIDENTIALTY NOTICE: This fax transmission is intended only for the addressee. It contains information that is legally privileged, confidential or otherwise protected from use or disclosure. If you are not the intended recipient, you are strictly prohibited from reviewing, disclosing, copying using or disseminating any of this information or taking any action in reliance on or regarding this information. If you have received this fax in error, please notify us immediately by telephone so that we can arrange for its return to [place]. Phone: [phone], Toll-Free: [phone], Fax: [phone] Page: 1 of 2 Call Id: [id number] [facility] Night - Client TELEPHONE ADVICE RECORD [facility] Call Center Patient Name: Jennifer [name] Gender: Female DOB: July 24, 1961 Age: 54 Y 12 D Return Phone Number: [phone] (Primary) Address: City/State/Zip: [place] Client [facility] Night - Client Client Site [facility] - Night Physician Ruthe Mannan Contact Type Call Call Type Triage / Clinical Caller Name Dorinda Hill Relationship To Patient Spouse Return Phone Number [phone] (Primary) Chief Complaint Medication reaction Initial Comment Caller states wife is refusing to take medicine; not making her feel good Nurse Assessment Nurse: Apolinar Junes, RN, Darl Pikes Date/Time Lamount Cohen Time): 10/27/2014 9:44:48 AM Confirm and document reason for call. If symptomatic, describe symptoms. ---Caller states wife is refusing to take medicine; not making her feel good - states he wants this documented very well because he is going to have to call social services - states March 21st she was supposed to start cutting back on her Zoloft 50 mg to one every other day for a week - then 1/2 every other day for a week - and she was supposed to start taking  Wellbutrin 3 tablets a day for a week and then drop to 2 tables a day - states she stopped taking her Zoloft 50 mg cold [place] on March 28th and is refusing to take them and he needs to know who he can call out to assess her - states she is screaming and out of control - she has not tried to hurt herself but she states she wants to die and she tried to hurt him but he will not let her - states they have been to ER in the past last time was about a month ago - states her doctor has told her anything over what she is prescribing right now will have to be handled by psychiatrist care - states she has not see psychiatrist yet - this started 2 days ago but her PCP is not aware of what is going on now he just needs to know who to call and again ask that I get all of this documented Has the patient traveled out of the country within the last 30 days? ---Not Applicable Does the patient require triage? ---No Please document clinical information provided and list any resource used. ---Advised caller he needs to hang up with me and call 911 now - advised that social services are not going to come on on a week end with a crisis type situation - I am not sure they do services at all on Saturday but certainly not in a crisis - he needs to have her transported to ER for her safety and others safety - the ER can get her under control and allow him to speak with psych services - Advised him I will be glad to look up the number to Social Services for  him in his area but right now he needs to call 911 - before we could finish the call - Mr Shella SpearingBoyer states he had to go his wife has just fell and hung up I tried to advice again before he hung up to call 911 and I will check back in a few minutes PLEASE NOTE: All timestamps contained within this report are represented as [place] Standard Time. CONFIDENTIALTY NOTICE: This fax transmission is intended only for the addressee. It contains information that is  legally privileged, confidential or otherwise protected from use or disclosure. If you are not the intended recipient, you are strictly prohibited from reviewing, disclosing, copying using or disseminating any of this information or taking any action in reliance on or regarding this information. If you have received this fax in error, please notify us immediately by telephone so that we can arrange for its return to us. Phone: [phone], Toll-Free: [phone], Fax: [phone] Page: 2 of 2 Call Id: [id number] Nurse Assessment Guidelines Guideline Title Affirmed Question Affirmed Notes Nurse Date/Time Lamount Cohen(Eastern Time) Disp. Time Lamount Cohen(Eastern Time) Disposition Final User 10/27/2014 10:06:36 AM 911 Follow Up Call Attempted Apolinar JunesBrandon, RN, Darl PikesSusan Reason: called back 2nd time - left a message on VM - advised caller to call us back if needed but I am hoping he has already called 911 to get assistance for his wife 10/27/2014 10:06:51 AM Clinical Call Yes Apolinar JunesBrandon, RN, Darl PikesSusan After Care Instructions Given Call Event Type User Date / Time Description Comments User: Brock RaSusan, Brandon, RN Date/Time Lamount Cohen(Eastern Time): 10/27/2014 10:04:28 AM attempted call back went to VM

## 2014-10-29 NOTE — Telephone Encounter (Signed)
I am very sorry to hear this.  Please call pt's husband to check on her.

## 2014-10-30 ENCOUNTER — Other Ambulatory Visit: Payer: Self-pay | Admitting: Family Medicine

## 2014-10-30 NOTE — Telephone Encounter (Signed)
Husband put her back on the Zoloft and he will give her the medication every other day for 1 week and then 1/2 everyother day for 1 week. He still going to try the taper to get her off the medication. He states they are fine now and if he has any other problems he will call us.

## 2014-10-30 NOTE — Telephone Encounter (Signed)
Noted! Thank you

## 2014-10-31 NOTE — Telephone Encounter (Signed)
Last f/u appt 07/2014 

## 2014-10-31 NOTE — Telephone Encounter (Signed)
Rx called in to requested pharmacy 

## 2014-11-10 ENCOUNTER — Other Ambulatory Visit: Payer: Self-pay | Admitting: Family Medicine

## 2014-11-12 ENCOUNTER — Telehealth: Payer: Self-pay

## 2014-11-12 NOTE — Telephone Encounter (Signed)
Spoke to pts husband and advised per Dr Dayton MartesAron; verbally expressed understanding.

## 2014-11-12 NOTE — Telephone Encounter (Signed)
Pt's husband Jennifer HillDonald left v/m; pt is having difficulty sleeping at night and request med. Pt is having trouble getting to sleep and after gets to sleep will wake up and cannot go back to sleep. [facility] last [facility] 08/06/14.

## 2014-11-12 NOTE — Telephone Encounter (Signed)
Given the complexity of her situation and her current rxs, I would advise he call her ALS doctor for recommendations/rx.

## 2014-11-12 NOTE — Telephone Encounter (Signed)
Rx called in to requested pharmacy 

## 2014-11-15 ENCOUNTER — Other Ambulatory Visit: Payer: Self-pay | Admitting: Family Medicine

## 2014-11-17 NOTE — Discharge Summary (Signed)
PATIENT NAME:  Jennifer [name], Rielynn MR#:  [id number] DATE OF BIRTH:  02/22/1961  DATE OF ADMISSION:  10/14/2013 DATE OF DISCHARGE:  10/16/2013  DISCHARGE DIAGNOSIS:  Lower extremity pain/spasm, worsening with opioids, but improving with Valium and we will schedule stopping Vicodin and oxycodone per neurology and limiting the use of opiate.   SECONDARY DIAGNOSES: 1.  Amyotrophic lateral sclerosis.  2.  Gastroesophageal reflux disease.   CONSULTATIONS:  1.  Neurology, Dr. Loretha BrasilZeylikman.  2.  Palliative care, Dr. Harvie JuniorPhifer.   PROCEDURES AND RADIOLOGIC STUDIES:   1.  Right hip x-ray on the 21st of March showed no acute bony pathology.  Bilateral lower extremity Doppler on the 21st of March showed no evidence of DVT.  2.  Left tibia and fibula x-ray on the 22nd of March showed no acute abnormality.   MAJOR LABORATORY PANEL:  UA on admission was negative.   HISTORY AND SHORT HOSPITAL COURSE:  The patient is a 54 year old female with the above-mentioned medical problems who was admitted for bilateral lower extremity pain and spasm leading to frequent fall.  Please see Dr. Rob HickmanGouru's dictated history and physical for further details.  Neurology consultation was obtained with Dr. Loretha BrasilZeylikman who recommended stopping Vicodin, oxycodone and limiting the use of opioid as this was thought to be worsening her symptoms.  The patient was started on Valium which seemed to help her and this was made as a scheduled dose.  She was walked with physical therapy and was feeling much better and was close to her baseline and was discharged home on the 23rd of March in stable condition.   PHYSICAL EXAMINATION: VITAL SIGNS:  On the date of discharge, her vital signs were as follows:  Temperature 97.9, heart rate 81 per minute, respirations 17 per minute, blood pressure 114/72 mmHg.  She was saturating 96% on room air.  Pertinent physical examination on the date of discharge: CARDIOVASCULAR:  S1, S2 normal.  No murmurs, rubs or  gallops. LUNGS:  Clear to auscultation bilaterally.  No wheezing, rales, rhonchi, or crepitation.  ABDOMEN:  Soft, benign.  NEUROLOGIC:  Nonfocal examination.  All of other physical examination remained at the baseline.   DISCHARGE MEDICATIONS: 1.  Omeprazole 40 mg by mouth daily.  2.  Bupropion 150 mg 2 capsules by mouth daily.  3.  Flonase two sprays intranasally daily.  4.  Baclofen 10 mg by mouth 3 times a day.  5.  Oxycodone   DICTATION ENDS HERE   ____________________________ Delan Ksiazek S. Sherryll BurgerShah, MD vss:ea D: 10/17/2013 22:45:17 ET T: 10/17/2013 23:42:32 ET JOB#: [id number]  cc: Anique Beckley S. Sherryll BurgerShah, MD, <Dictator> Ellamae SiaVIPUL S [facility] MD ELECTRONICALLY SIGNED 10/20/2013 19:21

## 2014-11-17 NOTE — Discharge Summary (Signed)
PATIENT NAME:  Jennifer [name], Jennifer MR#:  [id number] DATE OF BIRTH:  1960/09/18  DATE OF ADMISSION:  10/14/2013 DATE OF DISCHARGE:  10/16/2013  ADDENDUM:  This is a continuation of previously incomplete discharge summary.  DISCHARGE MEDICATIONS: 1. Omeprazole 40 mg p.o. daily.  2. Bupropion 150 mg 2 tablets p.o. daily.  3. Flonase 2 sprays intranasally daily.  4. Baclofen 10 mg p.o. 3 times a day. 5. Nuedexta 20 mg/10 mg 1 tablet p.o. b.i.d.  6. Bupropion 150 mg p.o. b.i.d.  7. Diazepam 2.5 mg p.o. 3 times a day.   DISCHARGE DIET: Low sodium.   DISCHARGE ACTIVITY: As tolerated.   DISCHARGE INSTRUCTIONS AND FOLLOWUP: The patient was instructed to follow up with her primary care physician, Dr. Ruthe Mannanalia Aron on coming Thursday on the 26th of March as scheduled. She will need [facility]. Please note, if by mistake patient was still listed on the medication reconciliation continuing to take oxycodone and Vicodin; that needs to be stopped as she should not be continuing those medications. She will also need to clarify her dose of bupropion as there are 2 different home dose of bupropion and this has been recommended to continue as she has been taking at home without any changes.   TOTAL TIME DISCHARGING THIS PATIENT: 55 minutes.  PLEASE UPDATE HER ALLERGY LIST WITH ADDITION OF 2 MORE MEDICATIONS AS: VICODIN AND OXYCODONE.   ____________________________ Greysin Medlen S. Sherryll BurgerShah, MD vss:lt D: 10/17/2013 22:48:00 ET T: 10/17/2013 23:25:43 ET JOB#: [id number]  cc: Bryn Gullingalia M. Dayton MartesAron, MD [facility] Yuriy Zeylikman, MD   Ellamae SiaVIPUL S [facility] MD ELECTRONICALLY SIGNED 10/20/2013 19:24

## 2014-11-17 NOTE — Consult Note (Signed)
PATIENT NAME:  Jennifer [name], Tamasha MR#:  [id number] DATE OF BIRTH:  01-Jul-1961  DATE OF CONSULTATION:  10/15/2013  REFERRING PHYSICIAN:   CONSULTING PHYSICIAN:  Pauletta BrownsYuriy Lear Carstens, MD  REASON FOR CONSULTATION:  Lower extremity pain, muscle spasms.   HISTORY OF PRESENT ILLNESS:  This is a 54 year old Caucasian female diagnosed with ALS about 3 years ago, [facility] by [facility] Mar[facility]ick [facility], next appointment in April, presenting with increased amount of falls and bilateral lower extremity pain. Initial diagnosis of her ALS was made about 3 years ago, but symptoms of generalized weakness and stiffening started about 8 years ago. Diagnosis was made based on speech abnormalities. The patient states her lower extremity pain is more spasm related and was worse so when she received Vicodin or oxycodone. Received Valium today and states the spasms have significantly improved; the pain is much better and the joints are not as stiff.   PAST MEDICAL HISTORY: ALS and GERD.    PAST SURGICAL HISTORY: Sinus surgery and a C-section.   ALLERGIES: PLEASE ADD OXYCODONE AND VICODIN TO THE ALLERGY LIST.   PSYCHOSOCIAL HISTORY: Lives at home with husband and daughter. No history of smoking, alcohol or illicit drug use.   FAMILY HISTORY: None.   REVIEW OF SYSTEMS: Currently no shortness of breath. No chest pain. No abdominal pain. No palpitations. Generalized weakness from ALS. Generalized pain from muscle contractures.   PHYSICAL EXAMINATION: VITAL SIGNS: Temperature 98.3, pulse 87, respirations 18, blood pressure 129/78, pulse oxygen is 95% on room air.  NEUROLOGIC EVALUATION:  The patient is awake, alert; oriented to time, place, location and the reason why she is in the hospital. Speech is dysarthric. Extraocular movements are intact. Pupils 3 mm, 2 mm, reactive bilaterally. Facial sensation intact. Diminished facial motor bilaterally. Motor strength: The patient has increased contractures and weakness of bilateral upper  and lower extremities, which is chronic. Spasms in the lower extremities, which have improved: Finger-to-nose difficult to assess because of the contractures.   ASSESSMENT: A 54 year old female with a history of amyotrophic lateral sclerosis, being [facility]ichard [facility], presenting with lower extremity pain, spasms, worse so with opioid medications.   PLAN: PLEASE ADD VICODIN AND OXYCODONE TO HER ALLERGY LIST. Limit the use of morphine. Agree with continuing Valium, as that has significantly helped the patient. Would actually schedule Valium 0.25 q. 8 hours rather than make it p.r.n. Discharge planning probably tomorrow and possibly visiting nurse home services. The patient has a followup appointment with Dr. Zannie KehrBedlack in about a month. The case was discussed with the patient and patient's family, who are currently at bedside.   Thank you. It was a pleasure seeing this patient.   ____________________________ Pauletta BrownsYuriy Lenon Kuennen, MD yz:cs D: 10/15/2013 15:36:00 ET T: 10/15/2013 19:56:55 ET JOB#: [id number]  cc: Pauletta BrownsYuriy Trenace Coughlin, MD, <Dictator>    Pauletta BrownsYURIY Cait Locust MD ELECTRONICALLY SIGNED 10/27/2013 11:32

## 2014-11-17 NOTE — H&P (Signed)
PATIENT NAME:  Jennifer [name], Jennifer [name] MR#:  [id number] DATE OF BIRTH:  1961-02-25  DATE OF ADMISSION:  10/14/2013  PRIMARY CARE PHYSICIAN: [facility]  REFERRING MEDICAL DOCTOR: Dr. Manson PasseyBrown.   CHIEF COMPLAINT: Bilateral lower extremity pain and frequent falls.   HISTORY OF PRESENT ILLNESS: The patient is a 54 year old Caucasian female with past medical history of ALS, GERD is presenting to the ER with a chief complaint of frequent falls and severe bilateral lower extremity pain. The patient was diagnosed with ALS approximately three years ago and she has been following with ALS physician as an outpatient. For the past few weeks the patient is experiencing severe bilateral lower extremity which has been progressively getting worse. Denies any urinary incontinence. The patient is unable to sleep for the past several days because of her severe pain. Also, the patient is frequently falling as reported by the patient's daughter and her husband at bedside. The patient is brought into the ER for worsening of the lower extremity pain. Ultrasound of the bilateral lower extremity has revealed no DVT. The patient's hip x-rays is found to be negative. The patient was given IV pain medication following  which she became very sleepy. During my examination, the patient is very sleepy from IV Valium, she is arousable, but falling asleep. History is obtained from the patient and her daughter at bedside.   PAST MEDICAL HISTORY: ALS, GERD.  PAST SURGICAL HISTORY: Sinus surgery and C-section.   ALLERGIES:NKDA  .  PSYCHOSOCIAL HISTORY: Lives at home with husband and daughter. No history of smoking, alcohol or illicit drug usage. She used to smoke but quit smoking and currently  smoking  electronic cigarettes.  FAMILY HISTORY: None.  REVIEW OF SYSTEMS: Unobtainable as the patient is lethargic from IV Valium.   HOME MEDICATIONS: Oxycodone 5 mg 1/2 tablet p.o. every six hours as needed, omeprazole  40  mg p.o. once daily,  Neudexta 20 - 10 1 capsule p.o. q.12 hours. Fluticasone two sprays each nostril once daily, bupropion 150 mg 1 tablet p.o. 2 times a day,  baclofen 10 mg 1 tablet p.o. 3 times a day,   alprazolam 0.5 mg 1 tablet 3 times a day, Tylenol with hydrocodone 325/5 mg p.o. every six hours as needed for pain.   PHYSICAL EXAMINATION:  VITAL SIGNS: Temperature 97.6, pulse 67, respirations 16, blood pressure 109/60, pulse oximetry was 98%.  GENERAL APPEARANCE: Not in acute distress. Moderately built and nourished, very lethargic as she has received IV Valium, arousable, but falling asleep.  HEENT: Normocephalic, atraumatic. Pupils are equally reactive to light and accommodation. No scleral icterus. No conjunctival injection. No sinus tenderness. Moist mucous membranes.  NECK: Supple. No JVD. No thyromegaly. Range of motion is intact.  LUNGS: Clear to auscultation bilaterally. No accessory muscle use and no anterior chest wall tenderness on palpation.  CARDIAC: S1, S2 normal. Regular rate and rhythm. No murmurs.  GASTROINTESTINAL: Soft. Bowel sounds are positive in all four quadrants. Nontender, nondistended. No hepatosplenomegaly. No masses felt. NEUROLOGICAL:  Very lethargic as the patient was given IV Valium in the ER, arousable but falling asleep, unable to perform motor and sensory examination. Cannot examine cranial nerves II through XII. EXTREMITIES:  No edema. No cyanosis. No clubbing.  SKIN: Warm to touch. Normal turgor. No rashes. No lesions.  MUSCULOSKELETAL: No joint effusion, tenderness, erythema.  PSYCHIATRIC: Mood and affect could not be elicited as the patient is lethargic.   LABORATORY AND IMAGING STUDIES: LFTs are normal. CBC is  normal.   Urinalysis: Ketones trace, bilirubin negative, glucose trace, nitrites and leukocyte esterase are negative. Chem-8 is normal except chloride at 110. Anion gap is 3. Right hip x-ray, no acute osseous abnormality Bilateral lower extremity  ultrasound, no DVT in visualized bilateral lower extremities.   ASSESSMENT AND PLAN: A 54 year old pleasant Caucasian female presenting to the ER with a chief complaint of worsening of bilateral lower extremity pain and frequent falls. Will be admitted with following assessment and plan:  1. Frequent falls with bilateral lower extremity weakness and pain, probably from worsening of Amyotrophic lateral sclerosis. We will provide her pain management. Physical therapy consult is placed.  2. Case management consult is placed regarding home health arrangements.  3. Gastroesophageal reflux disease. We will provide gastrointestinal prophylaxis.  4. Frequent falls. Orthopedic consult is placed regarding physical therapy evaluation.   CODE STATUS: SHE IS FULL CODE. Husband is the medical power of attorney.   Plan of care discussed with the patient's husband and daughter at bedside and they both understand understanding of the plan.   TOTAL TIME SPENT ON ADMISSION: 45 minutes.   ____________________________ Ramonita Lab, MD ag:sg D: 10/15/2013 03:00:03 ET T: 10/15/2013 07:24:59 ET JOB#: [id number]  cc: Ramonita Lab, MD, <Dictator> Ramonita Lab MD ELECTRONICALLY SIGNED 10/28/2013 21:49

## 2014-11-23 ENCOUNTER — Other Ambulatory Visit: Payer: Self-pay | Admitting: Family Medicine

## 2014-11-26 NOTE — Telephone Encounter (Signed)
Rx called in to requested pharmacy 

## 2014-11-26 NOTE — Telephone Encounter (Signed)
Last f/u appt 07/2014 

## 2014-12-09 ENCOUNTER — Other Ambulatory Visit: Payer: Self-pay | Admitting: Family Medicine

## 2014-12-27 ENCOUNTER — Encounter: Payer: Self-pay | Admitting: Emergency Medicine

## 2014-12-27 ENCOUNTER — Emergency Department
Admission: EM | Admit: 2014-12-27 | Discharge: 2014-12-27 | Disposition: A | Discharge status: Home / Self Care | Payer: BLUE CROSS/BLUE SHIELD | Attending: Emergency Medicine | Admitting: Emergency Medicine

## 2014-12-27 DIAGNOSIS — Z87891 Personal history of nicotine dependence: Secondary | ICD-10-CM | POA: Insufficient documentation

## 2014-12-27 DIAGNOSIS — Z79899 Other long term (current) drug therapy: Secondary | ICD-10-CM | POA: Diagnosis not present

## 2014-12-27 DIAGNOSIS — G1221 Amyotrophic lateral sclerosis: Secondary | ICD-10-CM | POA: Diagnosis not present

## 2014-12-27 DIAGNOSIS — F19959 Other psychoactive substance use, unspecified with psychoactive substance-induced psychotic disorder, unspecified: Secondary | ICD-10-CM

## 2014-12-27 DIAGNOSIS — T148 Other injury of unspecified body region: Secondary | ICD-10-CM

## 2014-12-27 DIAGNOSIS — Z7951 Long term (current) use of inhaled steroids: Secondary | ICD-10-CM | POA: Insufficient documentation

## 2014-12-27 DIAGNOSIS — F32A Depression, unspecified: Secondary | ICD-10-CM

## 2014-12-27 DIAGNOSIS — F329 Major depressive disorder, single episode, unspecified: Secondary | ICD-10-CM | POA: Diagnosis not present

## 2014-12-27 DIAGNOSIS — W503XXA Accidental bite by another person, initial encounter: Secondary | ICD-10-CM

## 2014-12-27 DIAGNOSIS — IMO0002 Reserved for concepts with insufficient information to code with codable children: Secondary | ICD-10-CM

## 2014-12-27 DIAGNOSIS — Z046 Encounter for general psychiatric examination, requested by authority: Secondary | ICD-10-CM | POA: Diagnosis present

## 2014-12-27 LAB — URINE DRUG SCREEN, QUALITATIVE (ARMC ONLY)
AMPHETAMINES, UR SCREEN: NOT DETECTED
BENZODIAZEPINE, UR SCRN: POSITIVE — AB
Barbiturates, Ur Screen: NOT DETECTED
CANNABINOID 50 NG, UR ~~LOC~~: POSITIVE — AB
Cocaine Metabolite,Ur ~~LOC~~: NOT DETECTED
MDMA (Ecstasy)Ur Screen: NOT DETECTED
Methadone Scn, Ur: NOT DETECTED
Opiate, Ur Screen: NOT DETECTED
Phencyclidine (PCP) Ur S: NOT DETECTED
TRICYCLIC, UR SCREEN: NOT DETECTED

## 2014-12-27 LAB — URINALYSIS COMPLETE WITH MICROSCOPIC (ARMC ONLY)
BILIRUBIN URINE: NEGATIVE
Bacteria, UA: NONE SEEN
Glucose, UA: NEGATIVE mg/dL
Leukocytes, UA: NEGATIVE
NITRITE: NEGATIVE
PH: 5 (ref 5.0–8.0)
Protein, ur: NEGATIVE mg/dL
SPECIFIC GRAVITY, URINE: 1.019 (ref 1.005–1.030)

## 2014-12-27 LAB — COMPREHENSIVE METABOLIC PANEL
ALBUMIN: 4.5 g/dL (ref 3.5–5.0)
ALK PHOS: 46 U/L (ref 38–126)
ALT: 12 U/L — ABNORMAL LOW (ref 14–54)
AST: 17 U/L (ref 15–41)
Anion gap: 8 (ref 5–15)
BILIRUBIN TOTAL: 0.5 mg/dL (ref 0.3–1.2)
BUN: 18 mg/dL (ref 6–20)
CALCIUM: 9.7 mg/dL (ref 8.9–10.3)
CO2: 28 mmol/L (ref 22–32)
Chloride: 103 mmol/L (ref 101–111)
Creatinine, Ser: 0.91 mg/dL (ref 0.44–1.00)
GFR calc Af Amer: >60 mL/min (ref >60)
GLUCOSE: 86 mg/dL (ref 65–99)
POTASSIUM: 4 mmol/L (ref 3.5–5.1)
Sodium: 139 mmol/L (ref 135–145)
Total Protein: 7.7 g/dL (ref 6.5–8.1)

## 2014-12-27 LAB — CBC
HCT: 39.7 % (ref 35.0–47.0)
Hemoglobin: 13.1 g/dL (ref 12.0–16.0)
MCH: 31.4 pg (ref 26.0–34.0)
MCHC: 33 g/dL (ref 32.0–36.0)
MCV: 95.1 fL (ref 80.0–100.0)
PLATELETS: 238 10*3/uL (ref 150–440)
RBC: 4.18 MIL/uL (ref 3.80–5.20)
RDW: 13 % (ref 11.5–14.5)
WBC: 7.1 10*3/uL (ref 3.6–11.0)

## 2014-12-27 LAB — ETHANOL: Alcohol, Ethyl (B): <5 mg/dL (ref <5)

## 2014-12-27 LAB — SALICYLATE LEVEL: Salicylate Lvl: <4 mg/dL (ref 2.8–30.0)

## 2014-12-27 LAB — ACETAMINOPHEN LEVEL: Acetaminophen (Tylenol), Serum: <10 ug/mL — ABNORMAL LOW (ref 10–30)

## 2014-12-27 NOTE — ED Provider Notes (Signed)
[facility] Emergency Department Provider Note  Time seen: 2:59 PM  I have reviewed the triage vital signs and the nursing notes.   HISTORY  Chief Complaint Psychiatric Evaluation    HPI Jennifer [name] is a 54 y.o. female the past medical history of ALS and depression who presents the emergency department with worsening depression and self injury. According to the patient and the husband she has had increased depression as her ALS has worsened. She is now refusing to eat many days, and biting herself to cause self injury. Husband denies any acute inciting event, but states as the ALS has progressed so has her depression.    Past Medical History  Diagnosis Date  . ALS (amyotrophic lateral sclerosis)   . Depression     Patient Active Problem List   Diagnosis Date Noted  . Major depressive disorder, recurrent severe without psychotic features 09/15/2014  . Fatigue 06/07/2014  . Muscle spasm 06/07/2014  . Generalized dystonia 10/24/2013  . Allergic reaction 10/24/2013  . Radicular pain of right lower back 08/21/2013  . Amyotrophic lateral sclerosis 05/23/2012  . ALS (amyotrophic lateral sclerosis) 09/28/2011  . 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 DYSARTHRIA 07/04/2010  . OTHER DYSPHAGIA 12/03/2009  . Depression 11/07/2008  . BACK PAIN, LUMBAR 08/24/2007  . HYPERLIPIDEMIA 08/03/2007  . TOBACCO ABUSE 08/03/2007  . ELEVATED BLOOD PRESSURE WITHOUT DIAGNOSIS OF HYPERTENSION 08/03/2007  . LUMBAR STRAIN 08/03/2007    Past Surgical History  Procedure Laterality Date  . Cesarean section    . Nasal sinus surgery      Current Outpatient Rx  Name  Route  Sig  Dispense  Refill  . ALPRAZolam (XANAX) 0.25 MG tablet      TAKE 2 TABLETS BY MOUTH 3 TIMES A DAY AS NEEDED   180 tablet   0     This request is for a new prescription for a contr ...   . benzonatate (TESSALON) 100 MG capsule      TAKE ONE CAPSULE BY MOUTH TWICE A DAY AS  NEEDED FOR COUGH   20 capsule   0   . buPROPion (WELLBUTRIN SR) 150 MG 12 hr tablet      TAKE 1 TABLET (150 MG TOTAL) BY MOUTH 4 (FOUR) TIMES DAILY.   120 tablet   1   . cholecalciferol (VITAMIN D) 1000 UNITS tablet   Oral   Take 1,000 Units by mouth daily.         [name] Dextromethorphan-Quinidine (NUEDEXTA) 20-10 MG CAPS   Oral   Take 1 capsule by mouth 2 (two) times daily.          . diazepam (VALIUM) 10 MG tablet      TAKE 1/2 - 1 TABLET EVERY 6 HOURS AS NEEDED   60 tablet   0     Not to exceed 5 additional fills before 04/29/2015 ...   . fluticasone (FLONASE) 50 MCG/ACT nasal spray      USE 2 SPRAYS IN EACH NOSTRIL EVERY DAY AS DIRECTED Patient taking differently: Place 2 sprays into both nostrils 2 (two) times daily as needed for allergies or rhinitis.    16 g   5   . ibuprofen (ADVIL,MOTRIN) 200 MG tablet   Oral   Take 800 mg by mouth 3 (three) times daily.         [name] lactulose (CHRONULAC) 10 GM/15ML solution   Oral   Take 10 g by mouth every other day.         [name]  levETIRAcetam (KEPPRA) 500 MG tablet   Oral   Take 1,000 mg by mouth 2 (two) times daily.         [name]. loratadine (CLARITIN) 10 MG tablet   Oral   Take 10 mg by mouth daily. Take one tablet by mouth daily as needed         . omeprazole (PRILOSEC) 40 MG capsule   Oral   Take 40 mg by mouth daily as needed (for acid reflex).          [name]. OVER THE COUNTER MEDICATION   Oral   Take 1 tablet by mouth daily. *Mitoq*         . OVER THE COUNTER MEDICATION   Oral   Take 1 tablet by mouth at bedtime. *Protandum*         . ranitidine (ZANTAC) 150 MG capsule   Oral   Take 150 mg by mouth daily as needed for heartburn.          . sertraline (ZOLOFT) 50 MG tablet      TAKE 1 TABLET BY MOUTH EVERY DAY   30 tablet   2     Allergies Bee venom; Shellfish allergy; Lipitor; Oxycodone; Prednisone; Vicodin; and Peanut-containing drug products  Family History  Problem Relation Age of Onset   . COPD Mother   . Thyroid disease Mother   . Diverticulosis Mother   . Heart attack Mother   . Gout Father   . Depression Sister   . Thyroid disease Sister   . Cancer Brother     kidney  . Heart attack Maternal Grandmother   . Heart attack Maternal Grandfather   . Heart attack Paternal Grandfather   . Hyperlipidemia Sister   . Evelene CroonWolff Parkinson White syndrome Sister 25  . Depression Brother   . Hypertension Brother     Social History History  Substance Use Topics  . Smoking status: Former Games developermoker  . Smokeless tobacco: Not on file  . Alcohol Use: No    Review of Systems Constitutional: Negative for fever. ENT: Patient states left lower tooth pain which she has been on and about 8 score. Pain is largely resolved/much improved compared to last week. Cardiovascular: Negative for chest pain. Respiratory: Negative for shortness of breath. Gastrointestinal: Negative for abdominal pain Genitourinary: Negative for dysuria. Neurological: Negative for headaches, focal weakness or numbness. Psychiatric:Positive for depression  10-point ROS otherwise negative.  ____________________________________________   PHYSICAL EXAM:  VITAL SIGNS: ED Triage Vitals  Enc Vitals Group     BP 12/27/14 1408 130/89 mmHg     Pulse Rate 12/27/14 1408 88     Resp 12/27/14 1408 20     Temp 12/27/14 1408 97.6 F (36.4 C)     Temp Source 12/27/14 1408 Oral     SpO2 12/27/14 1408 96 %     Weight 12/27/14 1408 118 lb (53.524 kg)     Height 12/27/14 1408 4\' 11"  (1.499 m)     Head Cir --      Peak Flow --      Pain Score 12/27/14 1410 0     Pain Loc --      Pain Edu? --      Excl. in GC? --     Constitutional: Alert and oriented. Well appearing ENT   Head: Normocephalic and atraumatic.   Mouth/Throat: Mucous membranes are moist. Left lower molar cavity. No tenderness of the gums or face. No redness of the face. Cardiovascular: Normal rate, regular  rhythm.  Respiratory: Normal  respiratory effort without tachypnea nor retractions. Breath sounds are clear  Gastrointestinal: Soft and nontender. No distention.   Musculoskeletal: Patient has several bite marks on her upper arms. Week extremities. Neurologic: Weak extremities largely at baseline, slurred speech at baseline. Psychiatric: Depression, tearful, flat affect  ____________________________________________    INITIAL IMPRESSION / ASSESSMENT AND PLAN / ED COURSE  Pertinent labs & imaging results that were available during my care of the patient were reviewed by me and considered in my medical decision making (see chart for details).  Patient here with increased depression. We will check labs, and have psychiatry evaluate.   ____________________________________________   FINAL CLINICAL IMPRESSION(S) / ED DIAGNOSES  Depression Self injury   Minna Antis, MD 12/27/14 1505

## 2014-12-27 NOTE — ED Notes (Signed)
Called [facility] [phone] °

## 2014-12-27 NOTE — ED Notes (Signed)
[facility] [phone]

## 2014-12-27 NOTE — ED Provider Notes (Addendum)
Dictation says she will leave unless she talks to psychiatrist. We will consult the television psychiatry to expedite the process. Unclear if she would be able to see a psychiatrist in person results tomorrow.  Myrna Blazeravid Matthew Schaevitz, MD 12/27/14 1734\   ----------------------------------------- 6:31 PM on 12/27/2014 -----------------------------------------  Seen by Dr. Toni Amendlapacs in the emergency department. Patient had steroid injection several days ago for dental procedure. Says that became agitated and suicidal after that. Has had similar reaction in the past steroids. However, at this time is no longer suicidal. Does not feel that she will bite herself anymore. Will be discharged home. Cleared for discharge by Dr. Toni Amendlapacs.  Myrna Blazeravid Matthew Schaevitz, MD 12/27/14 [phone]

## 2014-12-27 NOTE — ED Notes (Signed)
Patient presents to the ED with depression and self-harm.  Patient has been biting her arms in order to harm  Herself.  Patient states that she believes she is feeling depressed because she has been taking antibiotics and steroids for several days for an abscessed tooth.  Patient is very difficult to understand due to her ALS.  Family states patient has been depressed for quite some time.  Family states patient has been refusing to eat for several days.

## 2014-12-27 NOTE — ED Notes (Signed)
Husband states pt depression has been increasing, pt has bite marks on bilateral arms, pt tearful, stating she is depressed and SI, husband at bedside, pt has hx of ALS

## 2014-12-27 NOTE — ED Notes (Signed)
BEHAVIORAL HEALTH ROUNDING Patient sleeping: No. Patient alert and oriented: yes Behavior appropriate: Yes.  ;  Nutrition and fluids offered: Yes  Toileting and hygiene offered: Yes  Sitter present: yes Law enforcement present: Yes  
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
## 2014-12-27 NOTE — ED Notes (Signed)
BEHAVIORAL HEALTH ROUNDING Patient sleeping: No. Patient alert and oriented: yes Behavior appropriate: Yes.  ;  Nutrition and fluids offered: Yes  Toileting and hygiene offered: Yes  Sitter present: yes Law enforcement present: yes  ENVIRONMENTAL ASSESSMENT Potentially harmful objects out of patient reach: Yes.   Personal belongings secured: Yes.   Patient dressed in hospital provided attire only: Yes.   Plastic bags out of patient reach: Yes.   Patient care equipment (cords, cables, call bells, lines, and drains) shortened, removed, or accounted for: Yes.   Equipment and supplies removed from bottom of stretcher: Yes.   Potentially toxic materials out of patient reach: Yes.   Sharps container removed or out of patient reach: Yes.

## 2014-12-27 NOTE — Discharge Instructions (Signed)

## 2014-12-27 NOTE — ED Notes (Signed)
BEHAVIORAL HEALTH ROUNDING Patient sleeping: No. Patient alert and oriented: yes Behavior appropriate: Yes.  ; Nutrition and fluids offered: Yes  Toileting and hygiene offered: Yes  Sitter present: yes Law enforcement present: Yes   Husband at bedside

## 2014-12-28 ENCOUNTER — Observation Stay
Admission: EM | Admit: 2014-12-28 | Discharge: 2015-01-03 | Disposition: A | Discharge status: Skilled Nursing Facility | Payer: BLUE CROSS/BLUE SHIELD | Attending: Internal Medicine | Admitting: Internal Medicine

## 2014-12-28 ENCOUNTER — Emergency Department: Payer: BLUE CROSS/BLUE SHIELD

## 2014-12-28 DIAGNOSIS — R55 Syncope and collapse: Secondary | ICD-10-CM | POA: Diagnosis not present

## 2014-12-28 DIAGNOSIS — F332 Major depressive disorder, recurrent severe without psychotic features: Principal | ICD-10-CM | POA: Insufficient documentation

## 2014-12-28 DIAGNOSIS — G1221 Amyotrophic lateral sclerosis: Secondary | ICD-10-CM | POA: Insufficient documentation

## 2014-12-28 DIAGNOSIS — K219 Gastro-esophageal reflux disease without esophagitis: Secondary | ICD-10-CM | POA: Insufficient documentation

## 2014-12-28 DIAGNOSIS — R4182 Altered mental status, unspecified: Secondary | ICD-10-CM | POA: Insufficient documentation

## 2014-12-28 DIAGNOSIS — E785 Hyperlipidemia, unspecified: Secondary | ICD-10-CM | POA: Diagnosis not present

## 2014-12-28 DIAGNOSIS — J9811 Atelectasis: Secondary | ICD-10-CM | POA: Insufficient documentation

## 2014-12-28 DIAGNOSIS — G92 Toxic encephalopathy: Secondary | ICD-10-CM | POA: Diagnosis not present

## 2014-12-28 DIAGNOSIS — R339 Retention of urine, unspecified: Secondary | ICD-10-CM | POA: Insufficient documentation

## 2014-12-28 DIAGNOSIS — Z91013 Allergy to seafood: Secondary | ICD-10-CM | POA: Diagnosis not present

## 2014-12-28 DIAGNOSIS — Q638 Other specified congenital malformations of kidney: Secondary | ICD-10-CM | POA: Insufficient documentation

## 2014-12-28 DIAGNOSIS — F329 Major depressive disorder, single episode, unspecified: Secondary | ICD-10-CM

## 2014-12-28 DIAGNOSIS — Z885 Allergy status to narcotic agent status: Secondary | ICD-10-CM | POA: Insufficient documentation

## 2014-12-28 DIAGNOSIS — Z791 Long term (current) use of non-steroidal anti-inflammatories (NSAID): Secondary | ICD-10-CM | POA: Diagnosis not present

## 2014-12-28 DIAGNOSIS — N390 Urinary tract infection, site not specified: Secondary | ICD-10-CM | POA: Insufficient documentation

## 2014-12-28 DIAGNOSIS — Z7951 Long term (current) use of inhaled steroids: Secondary | ICD-10-CM | POA: Insufficient documentation

## 2014-12-28 DIAGNOSIS — Z79899 Other long term (current) drug therapy: Secondary | ICD-10-CM | POA: Diagnosis not present

## 2014-12-28 DIAGNOSIS — R45851 Suicidal ideations: Secondary | ICD-10-CM | POA: Diagnosis not present

## 2014-12-28 DIAGNOSIS — E876 Hypokalemia: Secondary | ICD-10-CM | POA: Insufficient documentation

## 2014-12-28 DIAGNOSIS — R4189 Other symptoms and signs involving cognitive functions and awareness: Secondary | ICD-10-CM | POA: Diagnosis present

## 2014-12-28 DIAGNOSIS — Z8249 Family history of ischemic heart disease and other diseases of the circulatory system: Secondary | ICD-10-CM | POA: Insufficient documentation

## 2014-12-28 DIAGNOSIS — Z888 Allergy status to other drugs, medicaments and biological substances status: Secondary | ICD-10-CM | POA: Insufficient documentation

## 2014-12-28 DIAGNOSIS — T380X5A Adverse effect of glucocorticoids and synthetic analogues, initial encounter: Secondary | ICD-10-CM | POA: Insufficient documentation

## 2014-12-28 DIAGNOSIS — Z91030 Bee allergy status: Secondary | ICD-10-CM | POA: Diagnosis not present

## 2014-12-28 DIAGNOSIS — Z91010 Allergy to peanuts: Secondary | ICD-10-CM | POA: Diagnosis not present

## 2014-12-28 DIAGNOSIS — F333 Major depressive disorder, recurrent, severe with psychotic symptoms: Secondary | ICD-10-CM | POA: Diagnosis not present

## 2014-12-28 DIAGNOSIS — Z87891 Personal history of nicotine dependence: Secondary | ICD-10-CM | POA: Diagnosis not present

## 2014-12-28 DIAGNOSIS — G9389 Other specified disorders of brain: Secondary | ICD-10-CM | POA: Diagnosis not present

## 2014-12-28 DIAGNOSIS — F32A Depression, unspecified: Secondary | ICD-10-CM

## 2014-12-28 LAB — URINALYSIS COMPLETE WITH MICROSCOPIC (ARMC ONLY)
BACTERIA UA: NONE SEEN
BILIRUBIN URINE: NEGATIVE
GLUCOSE, UA: NEGATIVE mg/dL
Hgb urine dipstick: NEGATIVE
LEUKOCYTES UA: NEGATIVE
Nitrite: NEGATIVE
Protein, ur: NEGATIVE mg/dL
SQUAMOUS EPITHELIAL / LPF: NONE SEEN
Specific Gravity, Urine: 1.021 (ref 1.005–1.030)
pH: 5 (ref 5.0–8.0)

## 2014-12-28 MED ORDER — BENZONATATE 100 MG PO CAPS: 100.0000 mg | ORAL_CAPSULE | Freq: Two times a day (BID) | ORAL | Status: DC | PRN

## 2014-12-28 MED ORDER — IBUPROFEN 800 MG PO TABS
800.0000 mg | ORAL_TABLET | Freq: Once | ORAL | Status: AC
  Administered 2014-12-28: 800 mg via ORAL

## 2014-12-28 MED ORDER — IBUPROFEN 400 MG PO TABS
ORAL_TABLET | ORAL | Status: AC
  Filled 2014-12-28: qty 2

## 2014-12-28 MED ORDER — LORATADINE 10 MG PO TABS
10.0000 mg | ORAL_TABLET | Freq: Every day | ORAL | Status: DC
  Administered 2014-12-28 – 2015-01-03 (×8): 10 mg via ORAL
  Filled 2014-12-28 (×8): qty 1

## 2014-12-28 MED ORDER — IBUPROFEN 800 MG PO TABS
ORAL_TABLET | ORAL | Status: AC
  Administered 2014-12-28: 800 mg via ORAL
  Filled 2014-12-28: qty 1

## 2014-12-28 MED ORDER — LORAZEPAM 2 MG/ML IJ SOLN
INTRAMUSCULAR | Status: AC
  Administered 2014-12-28: 2 mg via INTRAMUSCULAR
  Filled 2014-12-28: qty 1

## 2014-12-28 MED ORDER — DEXTROMETHORPHAN-QUINIDINE 20-10 MG PO CAPS: 1.0000 | ORAL_CAPSULE | Freq: Two times a day (BID) | ORAL | Status: DC

## 2014-12-28 MED ORDER — MIRTAZAPINE 30 MG PO TABS
30.0000 mg | ORAL_TABLET | Freq: Every day | ORAL | Status: DC
  Administered 2014-12-28 – 2014-12-30 (×3): 30 mg via ORAL
  Filled 2014-12-28 (×7): qty 1

## 2014-12-28 MED ORDER — IBUPROFEN 400 MG PO TABS
ORAL_TABLET | ORAL | Status: AC
  Administered 2014-12-28: 800 mg via ORAL
  Filled 2014-12-28: qty 2

## 2014-12-28 MED ORDER — LEVETIRACETAM 500 MG PO TABS
1000.0000 mg | ORAL_TABLET | Freq: Two times a day (BID) | ORAL | Status: DC
  Administered 2014-12-28 – 2015-01-01 (×9): 1000 mg via ORAL
  Filled 2014-12-28 (×2): qty 2

## 2014-12-28 MED ORDER — PANTOPRAZOLE SODIUM 40 MG PO TBEC
40.0000 mg | DELAYED_RELEASE_TABLET | Freq: Every day | ORAL | Status: DC
  Administered 2014-12-28 – 2015-01-03 (×7): 40 mg via ORAL
  Filled 2014-12-28 (×3): qty 1

## 2014-12-28 MED ORDER — LACTULOSE 10 GM/15ML PO SOLN
10.0000 g | ORAL | Status: DC
  Administered 2014-12-30 – 2015-01-03 (×3): 10 g via ORAL
  Filled 2014-12-28 (×2): qty 30

## 2014-12-28 MED ORDER — VITAMIN D 1000 UNITS PO TABS
1000.0000 [IU] | ORAL_TABLET | Freq: Every day | ORAL | Status: DC
  Administered 2014-12-28 – 2015-01-03 (×8): 1000 [IU] via ORAL
  Filled 2014-12-28 (×10): qty 1

## 2014-12-28 MED ORDER — FAMOTIDINE 20 MG PO TABS
20.0000 mg | ORAL_TABLET | Freq: Every day | ORAL | Status: DC | PRN
  Administered 2014-12-29: 20 mg via ORAL

## 2014-12-28 MED ORDER — CLINDAMYCIN HCL 150 MG PO CAPS
ORAL_CAPSULE | ORAL | Status: AC
  Administered 2014-12-28: 300 mg via ORAL
  Filled 2014-12-28: qty 2

## 2014-12-28 MED ORDER — ARIPIPRAZOLE 2 MG PO TABS
2.0000 mg | ORAL_TABLET | Freq: Every day | ORAL | Status: DC
  Administered 2014-12-29 – 2014-12-31 (×4): 2 mg via ORAL
  Filled 2014-12-28 (×5): qty 1

## 2014-12-28 MED ORDER — LORAZEPAM 2 MG/ML IJ SOLN
2.0000 mg | Freq: Once | INTRAMUSCULAR | Status: AC
Start: 2014-12-28 — End: 2014-12-28
  Administered 2014-12-28: 2 mg via INTRAMUSCULAR

## 2014-12-28 MED ORDER — LEVETIRACETAM 500 MG PO TABS
ORAL_TABLET | ORAL | Status: AC
  Administered 2014-12-28: 1000 mg via ORAL
  Filled 2014-12-28: qty 2

## 2014-12-28 MED ORDER — BUPROPION HCL ER (XL) 300 MG PO TB24
300.0000 mg | ORAL_TABLET | Freq: Two times a day (BID) | ORAL | Status: DC
  Administered 2014-12-28: 300 mg via ORAL
  Filled 2014-12-28 (×2): qty 1

## 2014-12-28 MED ORDER — DIAZEPAM 5 MG PO TABS
10.0000 mg | ORAL_TABLET | Freq: Four times a day (QID) | ORAL | Status: DC | PRN
  Administered 2014-12-29 – 2014-12-31 (×4): 10 mg via ORAL

## 2014-12-28 MED ORDER — LEVETIRACETAM 500 MG PO TABS
ORAL_TABLET | ORAL | Status: AC
Start: 2014-12-28 — End: 2014-12-28
  Administered 2014-12-28: 1000 mg via ORAL
  Filled 2014-12-28: qty 2

## 2014-12-28 MED ORDER — PANTOPRAZOLE SODIUM 40 MG PO TBEC
DELAYED_RELEASE_TABLET | ORAL | Status: AC
  Administered 2014-12-28: 40 mg via ORAL
  Filled 2014-12-28: qty 1

## 2014-12-28 MED ORDER — IBUPROFEN 400 MG PO TABS
800.0000 mg | ORAL_TABLET | Freq: Three times a day (TID) | ORAL | Status: DC
  Administered 2014-12-28 – 2015-01-03 (×16): 800 mg via ORAL
  Filled 2014-12-28 (×8): qty 2

## 2014-12-28 MED ORDER — LACTULOSE 10 GM/15ML PO SOLN
ORAL | Status: AC
  Filled 2014-12-28: qty 30

## 2014-12-28 MED ORDER — TIZANIDINE HCL 4 MG PO TABS
2.0000 mg | ORAL_TABLET | Freq: Every day | ORAL | Status: DC
  Administered 2014-12-28: 2 mg via ORAL
  Filled 2014-12-28 (×2): qty 1

## 2014-12-28 MED ORDER — CLINDAMYCIN HCL 150 MG PO CAPS
300.0000 mg | ORAL_CAPSULE | Freq: Two times a day (BID) | ORAL | Status: DC
  Administered 2014-12-28 – 2015-01-02 (×11): 300 mg via ORAL
  Filled 2014-12-28 (×8): qty 2

## 2014-12-28 MED ORDER — FLUTICASONE PROPIONATE 50 MCG/ACT NA SUSP
2.0000 | Freq: Two times a day (BID) | NASAL | Status: DC | PRN
  Filled 2014-12-28: qty 16

## 2014-12-28 NOTE — ED Notes (Signed)
Patient is resting comfortably. 
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
## 2014-12-28 NOTE — BH Assessment (Signed)
Assessment Note  Jennifer [name] is an 54 y.o. female Who presents to the ER via her husband ([phone]) due to worsening depression and attempting to harm herself. Pt. was seen in the ER on yesterday, due to induced psychosis, from an IV antibiotic/steroid. Husband reports she was doing well physically but on last night she began to decompensate again. EMS was called and when they arrived to the home they refused to bring her to the ER. Per the husband report, they assessed her and informed him she was cleared from the ER and they weren't going to transport her to the ER.  Husband further explains, the pt. has been diagnosed with ALS approximately 8 years ago. They have been married for 34 years and this is the worse he has seen her.   Pt. is voicing SI. Husband reports, she has refused to eat and hasn't eating in 5 days. Pt. stated she wanted to go home and be followed up with Hospice. Husband further reports, the pt. attempted to overdose on Valium but was stopped by family. This took place approximately a week ago. He also states she tried to break her legs on last night while sitting in her chair.  During the interview, the pt. was tearful and difficult to understand.  Axis I: Depressive Disorder NOS Axis III:  Past Medical History  Diagnosis Date  . ALS (amyotrophic lateral sclerosis)   . Depression    Axis IV: problems related to social environment, problems with access to health care services and problems with primary support group  Past Medical History:  Past Medical History  Diagnosis Date  . ALS (amyotrophic lateral sclerosis)   . Depression     Past Surgical History  Procedure Laterality Date  . Cesarean section    . Nasal sinus surgery      Family History:  Family History  Problem Relation Age of Onset  . COPD Mother   . Thyroid disease Mother   . Diverticulosis Mother   . Heart attack Mother   . Gout Father   . Depression Sister   . Thyroid disease Sister    . Cancer Brother     kidney  . Heart attack Maternal Grandmother   . Heart attack Maternal Grandfather   . Heart attack Paternal Grandfather   . Hyperlipidemia Sister   . Evelene CroonWolff Parkinson White syndrome Sister 25  . Depression Brother   . Hypertension Brother     Social History:  reports that she has quit smoking. She has never used smokeless tobacco. She reports that she does not drink alcohol or use illicit drugs.  Additional Social History:  Alcohol / Drug Use Pain Medications: None Reported Prescriptions: None Reported Over the Counter: None Reported History of alcohol / drug use?: No history of alcohol / drug abuse Longest period of sobriety (when/how long):  (None Reported) Negative Consequences of Use:  (None Reported) Withdrawal Symptoms:  (None Reported)  CIWA: CIWA-Ar BP: 115/78 mmHg Pulse Rate: 77 COWS:    Allergies:  Allergies  Allergen Reactions  . Bee Venom Swelling    Swelling at site   . Shellfish Allergy Anaphylaxis  . Lipitor [Atorvastatin] Other (See Comments)    Pt sts muscle discomfort  . Oxycodone Other (See Comments)    Muscle stiff   . Prednisone Other (See Comments)    psychosis  . Vicodin [Hydrocodone-Acetaminophen] Other (See Comments)    Muscle stiff   . Peanut-Containing Drug Products Diarrhea    Home Medications:  (Not  in a hospital admission)  OB/GYN Status:  No LMP recorded. Patient is postmenopausal.  General Assessment Data Location of Assessment: [facility] ED TTS Assessment: In system Is this a Tele or Face-to-Face Assessment?: Face-to-Face Is this an Initial Assessment or a Re-assessment for this encounter?: Initial Assessment Marital status: Married [place] name: n/a Is patient pregnant?: No Living Arrangements: Spouse/significant other, Children Can pt return to current living arrangement?: Yes Admission Status: Voluntary Is patient capable of signing voluntary admission?: Yes Referral Source: Other Insurance type:  Scientist, research (physical sciences) Exam [facility] Walk-in ONLY) Medical Exam completed: Yes  Crisis Care Plan Living Arrangements: Spouse/significant other, Children Name of Psychiatrist: n/a Name of Therapist: n/a  Education Status Is patient currently in school?: No Current Grade: n/a Highest grade of school patient has completed: Unknown Name of school: n/a Contact person: n/a  Risk to self with the past 6 months Suicidal Ideation: Yes-Currently Present Has patient been a risk to self within the past 6 months prior to admission? : Yes Suicidal Intent: Yes-Currently Present Has patient had any suicidal intent within the past 6 months prior to admission? : Yes Is patient at risk for suicide?: Yes Suicidal Plan?: Yes-Currently Present Has patient had any suicidal plan within the past 6 months prior to admission? : Yes Specify Current Suicidal Plan: Overdose and stopped eatting Access to Means: Yes What has been your use of drugs/alcohol within the last 12 months?: None Reported Previous Attempts/Gestures: Yes How many times?: 3 Other Self Harm Risks: Biting herself Triggers for Past Attempts: Other (Comment) (Health Problems) Intentional Self Injurious Behavior:  (Biting herself) Family Suicide History: Unknown Recent stressful life event(s): Loss (Comment), Other (Comment) (Declining Health) Persecutory voices/beliefs?: No Depression: Yes Depression Symptoms: Insomnia, Tearfulness, Fatigue, Loss of interest in usual pleasures, Feeling worthless/self pity, Feeling angry/irritable, Isolating Substance abuse history and/or treatment for substance abuse?: No Suicide prevention information given to non-admitted patients: Not applicable  Risk to Others within the past 6 months Homicidal Ideation: No Does patient have any lifetime risk of violence toward others beyond the six months prior to admission? : No Thoughts of Harm to Others: No Current Homicidal Intent: No Current Homicidal Plan:  No Access to Homicidal Means: No Identified Victim: None Reported History of harm to others?: No Assessment of Violence: None Noted Violent Behavior Description: None Reported Does patient have access to weapons?: No Criminal Charges Pending?: No Does patient have a court date: No Is patient on probation?: No  Psychosis Hallucinations: None noted Delusions: None noted  Mental Status Report Appearance/Hygiene: In scrubs, In hospital gown Eye Contact: Poor Motor Activity: Agitation, Rigidity, Unsteady Speech: Soft, Pressured Level of Consciousness: Alert, Restless, Crying, Irritable Mood: Depressed, Anxious, Despair, Helpless, Irritable, Sad, Worthless, low self-esteem Affect: Anxious, Depressed, Sad Anxiety Level: Moderate Thought Processes: Coherent, Relevant Judgement: Impaired Orientation: Person, Place, Time, Situation, Appropriate for developmental age Obsessive Compulsive Thoughts/Behaviors: Moderate  Cognitive Functioning Concentration: Normal Memory: Remote Intact, Recent Intact IQ: Average Insight: Poor Impulse Control: Poor Appetite: Poor Weight Loss: 0 Weight Gain: 0 Sleep: Decreased Total Hours of Sleep: 3 Vegetative Symptoms: None  ADLScreening [facility] Assessment Services) Patient's cognitive ability adequate to safely complete daily activities?: Yes Patient able to express need for assistance with ADLs?: Yes Independently performs ADLs?: No  Prior Inpatient Therapy Prior Inpatient Therapy: No Prior Therapy Dates: n/a Prior Therapy Facilty/Provider(s): n/a Reason for Treatment: n/a  Prior Outpatient Therapy Prior Outpatient Therapy: No Prior Therapy Dates: n/a Prior Therapy Facilty/Provider(s): n/a Reason for Treatment: n/a Does  patient have an ACCT team?: No Does patient have Intensive In-House Services?  : No Does patient have [facility] services? : No Does patient have P4CC services?: No  ADL Screening (condition at time of admission) Patient's  cognitive ability adequate to safely complete daily activities?: Yes Patient able to express need for assistance with ADLs?: Yes Independently performs ADLs?: No       Abuse/Neglect Assessment (Assessment to be complete while patient is alone) Physical Abuse: Denies Verbal Abuse: Denies Sexual Abuse: Denies Exploitation of patient/patient's resources: Denies Self-Neglect: Denies Values / Beliefs Cultural Requests During Hospitalization: None Spiritual Requests During Hospitalization: None Consults Spiritual Care Consult Needed: No Social Work Consult Needed: No Merchant navy officer (For Healthcare) Does patient have an advance directive?: No Would patient like information on creating an advanced directive?: Yes English as a second language teacher given    Additional Information 1:1 In Past 12 Months?: No CIRT Risk: No Elopement Risk: No Does patient have medical clearance?: No  Child/Adolescent Assessment Running Away Risk: Denies (Pt. is an adult)  Disposition:  Disposition Initial Assessment Completed for this Encounter: Yes Disposition of Patient: Other dispositions (Psych MD to see) Other disposition(s): Other (Comment) (Psych MD to see)  On Site Evaluation by:   Reviewed with Physician:    Lilyan Gilford, MS, LCAS, LPC, NCC, CCSI 12/28/2014 12:47 PM

## 2014-12-28 NOTE — ED Notes (Signed)
Pt refused to take medications

## 2014-12-28 NOTE — ED Notes (Signed)
Pt comes into the ED via EMS from home with c/o urinary retention since yesterday around 5pm when she was seen here for the same sx and had to be cathed.the patient has a hx of ALS, family is at the bedside..[name]
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
## 2014-12-28 NOTE — ED Notes (Signed)
BEHAVIORAL HEALTH ROUNDING Patient sleeping: No. Patient alert and oriented: yes Behavior appropriate: Yes.  ; If no, describe:  Nutrition and fluids offered: No Toileting and hygiene offered: No Sitter present: yes Law enforcement present: Yes  

## 2014-12-28 NOTE — ED Notes (Signed)
Pharmacy called for Cleocin.

## 2014-12-28 NOTE — ED Notes (Addendum)
Pt reports increased discomfort with cath and requested patient relations to come speak with her. Pt relations called. Pt tearful and anxious. This RN, pts husband, and MD unable to calm pt. Pt continues to attempt ripping urinary cat out. Pt stats "I will take this out myself if I have to."

## 2014-12-28 NOTE — ED Notes (Signed)
Psych MD at bedside with

## 2014-12-28 NOTE — Progress Notes (Signed)
  Patient evaluated by Psych MD with recommendation for inpatient psychiatric treatment.  Referral information for Geriatric Placement have been faxed to; [name]), [phone]), [phone]), [phone]), [phone]), [phone]), [phone]), [place] [facility] [phone])  Jennifer [name]. Jennifer MajorsLCSWA, MSW Clinical Social Work Department Emergency Room [phone] 5:44 PM

## 2014-12-28 NOTE — ED Notes (Signed)
Urinary cath removed per MD order after pt had verbalized discomfort. Psych MD verbalized support for removal of cath as well to decrease stress on pt. Pt no longer tearful or anxious. Pt reports being comfortable at this time.

## 2014-12-28 NOTE — ED Notes (Signed)
Sitter called to bedside to watch pt and prevent pt from taking out urinary cath. Pt remains tearful and anxious. MD notified

## 2014-12-28 NOTE — ED Notes (Signed)
BEHAVIORAL HEALTH ROUNDING Patient sleeping: No. Patient alert and oriented: yes Behavior appropriate: No.; If no, describe: pt verbalizing desire to leave. Pt atmepting to get out of chair Nutrition and fluids offered: Yes  Toileting and hygiene offered: Yes  Sitter present: yes Law enforcement present: yes

## 2014-12-28 NOTE — ED Notes (Signed)
Pt was attempting to climb out of chair. Pad were brought in to lay on the floor as fall precaution. Pts husband requested patient be able to sit on mats on floor. I assisted pt to floor where she stayed for approximately 5 minutes. Pt assisted to restroom by me and husband, then assisted back to chair. Pt resting comfortably at this time.

## 2014-12-28 NOTE — ED Notes (Signed)
Pt heard yelling from nurses station, when walking into the room pt is yelling at her daughter, pt states "I want her to leave" daughter asked to leave the room.the patient states "no one know what its like to have ALS", pt is tearful..  Pt also states "they want let me have my walker"..  EDP notified and asked if a PT consult should be ordered to determine pt physical ability, no new orders at this time.

## 2014-12-28 NOTE — ED Notes (Addendum)
Pt resting in chair. Sitter at bedside. Pt in no acute distress. Posey  Fall pad placed on floor around pt.

## 2014-12-28 NOTE — ED Notes (Signed)
BEHAVIORAL HEALTH ROUNDING Patient sleeping: No. Patient alert and oriented: yes Behavior appropriate: Yes.  ; If no, describe:  Nutrition and fluids offered: Yes  Toileting and hygiene offered: Yes  Sitter present: no Law enforcement present: Yes  

## 2014-12-29 MED ORDER — GABAPENTIN 300 MG PO CAPS
300.0000 mg | ORAL_CAPSULE | Freq: Two times a day (BID) | ORAL | Status: DC
  Administered 2014-12-30 – 2014-12-31 (×3): 300 mg via ORAL

## 2014-12-29 MED ORDER — TIZANIDINE HCL 4 MG PO TABS
2.0000 mg | ORAL_TABLET | Freq: Every day | ORAL | Status: DC
  Administered 2014-12-29 – 2015-01-02 (×4): 2 mg via ORAL
  Filled 2014-12-29 (×7): qty 1

## 2014-12-29 MED ORDER — PANTOPRAZOLE SODIUM 40 MG PO TBEC
DELAYED_RELEASE_TABLET | ORAL | Status: AC
  Administered 2014-12-29: 40 mg via ORAL
  Filled 2014-12-29: qty 1

## 2014-12-29 MED ORDER — FAMOTIDINE 20 MG PO TABS
ORAL_TABLET | ORAL | Status: AC
  Administered 2014-12-29: 20 mg via ORAL
  Filled 2014-12-29: qty 1

## 2014-12-29 MED ORDER — DIAZEPAM 5 MG PO TABS
ORAL_TABLET | ORAL | Status: AC
  Filled 2014-12-29: qty 2

## 2014-12-29 MED ORDER — IBUPROFEN 800 MG PO TABS
ORAL_TABLET | ORAL | Status: AC
  Administered 2014-12-29: 800 mg via ORAL
  Filled 2014-12-29: qty 1

## 2014-12-29 MED ORDER — GABAPENTIN 300 MG PO CAPS
ORAL_CAPSULE | ORAL | Status: AC
  Filled 2014-12-29: qty 1

## 2014-12-29 MED ORDER — CLINDAMYCIN HCL 150 MG PO CAPS
ORAL_CAPSULE | ORAL | Status: AC
  Administered 2014-12-29: 300 mg via ORAL
  Filled 2014-12-29: qty 2

## 2014-12-29 MED ORDER — LORAZEPAM 2 MG/ML IJ SOLN
2.0000 mg | Freq: Once | INTRAMUSCULAR | Status: AC
  Administered 2014-12-29: 2 mg via INTRAMUSCULAR

## 2014-12-29 MED ORDER — LEVETIRACETAM 500 MG PO TABS
ORAL_TABLET | ORAL | Status: AC
  Administered 2014-12-29: 1000 mg via ORAL
  Filled 2014-12-29: qty 2

## 2014-12-29 MED ORDER — LORAZEPAM 2 MG/ML IJ SOLN
INTRAMUSCULAR | Status: AC
  Filled 2014-12-29: qty 1

## 2014-12-29 MED ORDER — LORAZEPAM 2 MG/ML IJ SOLN: 2.0000 mg | Freq: Once | INTRAMUSCULAR | Status: DC

## 2014-12-29 MED ORDER — IBUPROFEN 800 MG PO TABS
ORAL_TABLET | ORAL | Status: AC
  Filled 2014-12-29: qty 1

## 2014-12-29 MED ORDER — GABAPENTIN 600 MG PO TABS
300.0000 mg | ORAL_TABLET | Freq: Two times a day (BID) | ORAL | Status: DC
  Filled 2014-12-29 (×3): qty 0.5

## 2014-12-29 NOTE — ED Notes (Signed)
BEHAVIORAL HEALTH ROUNDING Patient sleeping: No. Patient alert and oriented: yes Behavior appropriate: Yes.  ; If no, describe:  Nutrition and fluids offered: Yes  Toileting and hygiene offered: No Sitter present: no Law enforcement present: Yes  

## 2014-12-29 NOTE — ED Notes (Signed)
.  [facility] 

## 2014-12-29 NOTE — ED Notes (Signed)

## 2014-12-29 NOTE — ED Notes (Signed)
Family at bedside. Tech and [employer]  Moved pt to reclining chair. Questions answered. Will continue to monitor.

## 2014-12-29 NOTE — ED Provider Notes (Signed)
Patient is currently being followed by psychiatry for depression and suicidal ideation. The patient does have debilitating ALS and is stating that she wants to die. She is requesting palliative care and hospice care. There is no palliative care available for consultation on the weekend, however didn't want go ahead and put in the order so that on Monday she may be able to be seen by the palliative care team.  Governor Rooksebecca Delpha Perko, MD 12/29/14 1446

## 2014-12-29 NOTE — ED Notes (Signed)
BEHAVIORAL HEALTH ROUNDING Patient sleeping: No. Patient alert and oriented: yes Behavior appropriate: No.; If no, describe: pt is tearful at times, screams out  Nutrition and fluids offered: Yes  Toileting and hygiene offered: Yes  Sitter present: no Law enforcement present: Yes

## 2014-12-29 NOTE — ED Provider Notes (Signed)
-----------------------------------------   7:13 AM on 12/29/2014 -----------------------------------------   BP 100/72 mmHg  Pulse 70  Temp(Src) 97.5 F (36.4 C) (Oral)  Resp 18  Ht 4\' 11"  (1.499 m)  Wt 122 lb 4.8 oz (55.475 kg)  BMI 24.69 kg/m2  SpO2 96%  The patient had no acute events since last update.  Calm and cooperative at this time.  Disposition is pending per Psychiatry/Behavioral Medicine team recommendations.     Sharman CheekPhillip Keani Gotcher, MD 12/29/14 [phone]

## 2014-12-29 NOTE — ED Notes (Signed)
Family left due to patient saying she no longer wants spouse there. MD talked with patient regarding pain management.

## 2014-12-29 NOTE — ED Notes (Addendum)
BEHAVIORAL HEALTH ROUNDING Patient sleeping: Yes.   Patient alert and oriented: not applicable Behavior appropriate: Yes.  ; If no, describe:   Nutrition and fluids offered: No Toileting and hygiene offered: No Sitter present: no Law enforcement present: Yes  and ODS  ENVIRONMENTAL ASSESSMENT Potentially harmful objects out of patient reach: Yes.   Personal belongings secured: Yes.   Patient dressed in hospital provided attire only: Yes.   Plastic bags out of patient reach: Yes.   Patient care equipment (cords, cables, call bells, lines, and drains) shortened, removed, or accounted for: Yes.   Equipment and supplies removed from bottom of stretcher: Yes.   Potentially toxic materials out of patient reach: Yes.   Sharps container removed or out of patient reach: Yes.    

## 2014-12-29 NOTE — ED Notes (Signed)
BEHAVIORAL HEALTH ROUNDING Patient sleeping: Yes.   Patient alert and oriented: not applicable Behavior appropriate: Yes.  ; If no, describe:   Nutrition and fluids offered: No Toileting and hygiene offered: No Sitter present: no Law enforcement present: Yes  and ODS    

## 2014-12-29 NOTE — ED Notes (Signed)
Pt informed to return if life threatening symptoms occur.   

## 2014-12-29 NOTE — ED Notes (Signed)
BEHAVIORAL HEALTH ROUNDING Patient sleeping: No. Patient alert and oriented: yes Behavior appropriate: No.; If no, describe: pt tries to get out of bed  Nutrition and fluids offered: Yes  Toileting and hygiene offered: Yes  Sitter present: no Law enforcement present: Yes

## 2014-12-29 NOTE — ED Notes (Signed)
BEHAVIORAL HEALTH ROUNDING Patient sleeping: Yes.   Patient alert and oriented: yes Behavior appropriate: Yes.  ; If no, describe:  Nutrition and fluids offered: Yes  Toileting and hygiene offered: Yes  Sitter present: no Law enforcement present: Yes  

## 2014-12-29 NOTE — ED Notes (Signed)
BEHAVIORAL HEALTH ROUNDING Patient sleeping: No. Patient alert and oriented: yes Behavior appropriate: No.; If no, describe: pt tries to get out of bed or chair. Nutrition and fluids offered: Yes  Toileting and hygiene offered: Yes  Sitter present: no Law enforcement present: Yes

## 2014-12-29 NOTE — ED Notes (Signed)
BEHAVIORAL HEALTH ROUNDING Patient sleeping: No. Patient alert and oriented: yes Behavior appropriate: Yes.  ; If no, describe:  Nutrition and fluids offered: Yes  Toileting and hygiene offered: Yes  Sitter present: no Law enforcement present: Yes  

## 2014-12-29 NOTE — ED Notes (Signed)
Chaplain at bedside

## 2014-12-29 NOTE — ED Notes (Signed)
e

## 2014-12-29 NOTE — ED Notes (Signed)
IVC/Psych consult completed/ Palliative consult pending/Pending placement

## 2014-12-29 NOTE — ED Notes (Signed)
Pt had family visit. Pt has kicked family out for the second time today. Pt keeps sending people such as social services,friends and family alarming messages. Reviewed with patient the purpose of the phone is to communicate not to alarm others.Offered pt cross word puzzles, coloring pages etc, patient has denied.Pt upset at times.

## 2014-12-30 MED ORDER — IBUPROFEN 800 MG PO TABS
ORAL_TABLET | ORAL | Status: AC
  Administered 2014-12-30: 800 mg via ORAL
  Filled 2014-12-30: qty 1

## 2014-12-30 MED ORDER — LEVETIRACETAM 500 MG PO TABS
ORAL_TABLET | ORAL | Status: AC
  Administered 2014-12-30: 1000 mg via ORAL
  Filled 2014-12-30: qty 2

## 2014-12-30 MED ORDER — LACTULOSE 10 GM/15ML PO SOLN
ORAL | Status: AC
Start: 2014-12-30 — End: 2014-12-30
  Administered 2014-12-30: 10 g via ORAL
  Filled 2014-12-30: qty 30

## 2014-12-30 MED ORDER — KETOROLAC TROMETHAMINE 30 MG/ML IJ SOLN
INTRAMUSCULAR | Status: AC
  Filled 2014-12-30: qty 1

## 2014-12-30 MED ORDER — CLINDAMYCIN HCL 150 MG PO CAPS
ORAL_CAPSULE | ORAL | Status: AC
  Administered 2014-12-30: 300 mg via ORAL
  Filled 2014-12-30: qty 2

## 2014-12-30 MED ORDER — CLINDAMYCIN HCL 150 MG PO CAPS
ORAL_CAPSULE | ORAL | Status: AC
Start: 2014-12-30 — End: 2014-12-30
  Administered 2014-12-30: 300 mg via ORAL
  Filled 2014-12-30: qty 2

## 2014-12-30 MED ORDER — DIAZEPAM 5 MG PO TABS
ORAL_TABLET | ORAL | Status: AC
  Administered 2014-12-30: 10 mg via ORAL
  Filled 2014-12-30: qty 2

## 2014-12-30 MED ORDER — PANTOPRAZOLE SODIUM 40 MG PO TBEC
DELAYED_RELEASE_TABLET | ORAL | Status: AC
  Administered 2014-12-30: 40 mg via ORAL
  Filled 2014-12-30: qty 1

## 2014-12-30 MED ORDER — DIAZEPAM 5 MG PO TABS
ORAL_TABLET | ORAL | Status: AC
  Filled 2014-12-30: qty 2

## 2014-12-30 MED ORDER — GABAPENTIN 300 MG PO CAPS
ORAL_CAPSULE | ORAL | Status: AC
  Filled 2014-12-30: qty 1

## 2014-12-30 MED ORDER — GABAPENTIN 300 MG PO CAPS
ORAL_CAPSULE | ORAL | Status: AC
Start: 2014-12-30 — End: 2014-12-30
  Administered 2014-12-30: 300 mg via ORAL
  Filled 2014-12-30: qty 1

## 2014-12-30 MED ORDER — DIAZEPAM 5 MG PO TABS: 10.0000 mg | ORAL_TABLET | Freq: Once | ORAL | Status: AC

## 2014-12-30 NOTE — ED Notes (Signed)
BEHAVIORAL HEALTH ROUNDING Patient sleeping: No. Patient alert and oriented: yes Behavior appropriate: Yes.  ; If no, describe:  Nutrition and fluids offered: Yes  Toileting and hygiene offered: Yes  Sitter present: no Law enforcement present: Yes OD 

## 2014-12-30 NOTE — ED Notes (Signed)
Still Palliative consult.

## 2014-12-30 NOTE — ED Notes (Signed)
BEHAVIORAL HEALTH ROUNDING Patient sleeping: No. Patient alert and oriented: yes Behavior appropriate: Yes.  ; If no, describe:  Nutrition and fluids offered: Yes  Toileting and hygiene offered: Yes  Sitter present: no Law enforcement present: Yes  

## 2014-12-30 NOTE — BH Specialist Note (Signed)
Client has been denied by [facility] and [facility] because of client's medical status.

## 2014-12-30 NOTE — ED Notes (Signed)
BEHAVIORAL HEALTH ROUNDING Patient sleeping: Yes.   Patient alert and oriented: na Behavior appropriate: Yes.  ; If no, describe:  Nutrition and fluids offered: No Toileting and hygiene offered: No Sitter present: no Law enforcement present: Yes OD

## 2014-12-30 NOTE — ED Notes (Signed)
Bed bath given by ED tech

## 2014-12-30 NOTE — Progress Notes (Signed)
CSW called to follow up on referral faxed out for patient [name]:  Left message to call  [facility]: Pt must be 55 years or older  [phone]) unable to review referral at this time   [facility] ; per Koleen NimrodAdrian denied due to medical acuity   [place]; per LaytonKathlene Pt must be 55 years or older [place]- denied due to acuity  Will [facility]  Cheyene Hamric. Theresia MajorsLCSWA, MSW Clinical Social Work Department Emergency Room [phone] 2:42 PM

## 2014-12-30 NOTE — Progress Notes (Signed)
CSW in to meet with patient for ongoing assessment.  Patient sitting on the side of bed with husband and daughter at bedside.  CSW provided an update on the inpatient bed search.   Patient states she is feeling better and wants to go home.  Patient denies SI, HI, AH and VA.  Husband is working to someone to stay with patient  24/7 and will have that in place by tomorrow.  States patient needs someone with her at least 12 hours a day until he and daughter are home.   Patient's daughter is graduation from [employer] tomorrow and patient states she wants to attend.  Husband and daughter had patient up with her walker in the room.   Patient has been denied by several inpatient facilities.  Husband is willing to take patient home with outpatient treatment if possible.  Patient to be reevaluated tomorrow by Psych MD.   Sammuel Hineseborah Niveah Boerner. Theresia MajorsLCSWA, MSW Clinical Social Work Department Emergency Room [phone] 4:16 PM

## 2014-12-30 NOTE — ED Notes (Signed)
BEHAVIORAL HEALTH ROUNDING Patient sleeping: Yes.   Patient alert and oriented: sleeping Behavior appropriate: Yes.  ; If no, describe:  Nutrition and fluids offered: sleeping Toileting and hygiene offered: sleeping Sitter present: no Law enforcement present: Yes  

## 2014-12-30 NOTE — BH Specialist Note (Signed)
Client information has been faxed to the following facilities for inpatient services/consideration. [facility]; [facility]; [facility]; and [facility].

## 2014-12-30 NOTE — ED Provider Notes (Signed)
-----------------------------------------   6:18 AM on 12/30/2014 -----------------------------------------   BP 136/81 mmHg  Pulse 81  Temp(Src) 97.8 F (36.6 C) (Oral)  Resp 18  Ht 4\' 11"  (1.499 m)  Wt 122 lb 4.8 oz (55.475 kg)  BMI 24.69 kg/m2  SpO2 98%  The patient had no acute events since last update.  Calm and cooperative at this time.  Disposition is pending per Psychiatry/Behavioral Medicine team recommendations.     Rebecka ApleyAllison P Bama Hanselman, MD 12/30/14 [id number]

## 2014-12-30 NOTE — ED Notes (Signed)
Family in at bedside.

## 2014-12-30 NOTE — ED Notes (Signed)

## 2014-12-30 NOTE — ED Notes (Signed)
BEHAVIORAL HEALTH ROUNDING Patient sleeping: Yes.   Patient alert and oriented: no Behavior appropriate: Yes.  ; If no, describe:  Nutrition and fluids offered: No Toileting and hygiene offered: No Sitter present: no Law enforcement present: Yes OD 

## 2014-12-30 NOTE — BH Specialist Note (Signed)
Client information has been faxed to [facility]; [facility]; [facility]; [facility]; and [facility].

## 2014-12-30 NOTE — ED Provider Notes (Signed)
This patient with ALS came to the emergency department with concerns for urinary retention, however she has been kept in the emergency Department due to concerns for her suicidal thoughts. She was seen on Friday by Dr. Toni Amendlapacs a psychiatrist.   I was asked to see the patient emergency departments evening because she is feeling better with by to go home. 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 She is able to communicate to me through voice and threw typing on and I found that she would like to go home in large part because her daughter graduates tomorrow. The husband was in the emergency department just half an hour ago. She used her cell phone to call him and we had a conversation with all 3 of us. The patient agrees to stay in the emergency department until she is seen again by Dr. Toni Amendlapacs tomorrow. The husband agrees with this plan as well. We will do we can to facilitate her care social be present for the graduation which is at 5:30 PM.  Darien Ramusavid W Niylah Hassan, MD 12/30/14 2128

## 2014-12-30 NOTE — ED Notes (Signed)
Dinner meal provided by this RN. Pt observed with no unusual behavior. Appropriate to stimulation. No verbalized needs or concerns at this time. NAD assessed. Will continue to monitor.   

## 2014-12-30 NOTE — ED Notes (Signed)
Pt eating dinner

## 2014-12-30 NOTE — ED Notes (Signed)
BEHAVIORAL HEALTH ROUNDING Patient sleeping: Yes.   Patient alert and oriented: not applicable Behavior appropriate: Yes.  ; If no, describe:  Nutrition and fluids offered: No Toileting and hygiene offered: No Sitter present: no Law enforcement present: Yes OD 

## 2014-12-30 NOTE — ED Notes (Signed)
Observed family standing pt upright upon entrance into treatment room. Family made aware of potential fall risk and need for further assistance. Family refused assistance at this time.

## 2014-12-31 ENCOUNTER — Emergency Department: Payer: BLUE CROSS/BLUE SHIELD

## 2014-12-31 DIAGNOSIS — R4189 Other symptoms and signs involving cognitive functions and awareness: Secondary | ICD-10-CM | POA: Diagnosis present

## 2014-12-31 LAB — COMPREHENSIVE METABOLIC PANEL
ALT: 11 U/L — AB (ref 14–54)
AST: 17 U/L (ref 15–41)
Albumin: 4 g/dL (ref 3.5–5.0)
Alkaline Phosphatase: 37 U/L — ABNORMAL LOW (ref 38–126)
Anion gap: 11 (ref 5–15)
BILIRUBIN TOTAL: 0.6 mg/dL (ref 0.3–1.2)
BUN: 18 mg/dL (ref 6–20)
CO2: 25 mmol/L (ref 22–32)
Calcium: 9.6 mg/dL (ref 8.9–10.3)
Chloride: 109 mmol/L (ref 101–111)
Creatinine, Ser: 0.86 mg/dL (ref 0.44–1.00)
GFR calc Af Amer: >60 mL/min (ref >60)
Glucose, Bld: 102 mg/dL — ABNORMAL HIGH (ref 65–99)
Potassium: 3 mmol/L — ABNORMAL LOW (ref 3.5–5.1)
Sodium: 145 mmol/L (ref 135–145)
Total Protein: 7 g/dL (ref 6.5–8.1)

## 2014-12-31 LAB — CBC WITH DIFFERENTIAL/PLATELET
Basophils Absolute: 0 10*3/uL (ref 0–0.1)
Basophils Relative: 1 %
Eosinophils Absolute: 0.3 10*3/uL (ref 0–0.7)
Eosinophils Relative: 4 %
HCT: 39.2 % (ref 35.0–47.0)
Hemoglobin: 13.1 g/dL (ref 12.0–16.0)
LYMPHS PCT: 35 %
Lymphs Abs: 2.4 10*3/uL (ref 1.0–3.6)
MCH: 31.7 pg (ref 26.0–34.0)
MCHC: 33.5 g/dL (ref 32.0–36.0)
MCV: 94.6 fL (ref 80.0–100.0)
MONO ABS: 0.4 10*3/uL (ref 0.2–0.9)
Monocytes Relative: 6 %
NEUTROS PCT: 54 %
Neutro Abs: 3.8 10*3/uL (ref 1.4–6.5)
Platelets: 216 10*3/uL (ref 150–440)
RBC: 4.15 MIL/uL (ref 3.80–5.20)
RDW: 13.4 % (ref 11.5–14.5)
WBC: 7 10*3/uL (ref 3.6–11.0)

## 2014-12-31 LAB — AMMONIA: AMMONIA: 15 umol/L (ref 9–35)

## 2014-12-31 LAB — GLUCOSE, CAPILLARY: Glucose-Capillary: 103 mg/dL — ABNORMAL HIGH (ref 65–99)

## 2014-12-31 MED ORDER — POTASSIUM CHLORIDE 20 MEQ PO PACK
40.0000 meq | PACK | Freq: Once | ORAL | Status: AC
  Administered 2014-12-31: 40 meq via ORAL

## 2014-12-31 MED ORDER — IBUPROFEN 800 MG PO TABS
ORAL_TABLET | ORAL | Status: AC
  Filled 2014-12-31: qty 1

## 2014-12-31 MED ORDER — GABAPENTIN 300 MG PO CAPS
ORAL_CAPSULE | ORAL | Status: AC
Start: 2014-12-31 — End: 2014-12-31
  Administered 2014-12-31: 300 mg via ORAL
  Filled 2014-12-31: qty 1

## 2014-12-31 MED ORDER — HEPARIN SODIUM (PORCINE) 5000 UNIT/ML IJ SOLN
5000.0000 [IU] | Freq: Three times a day (TID) | INTRAMUSCULAR | Status: DC
  Administered 2015-01-01 (×2): 5000 [IU] via SUBCUTANEOUS
  Filled 2014-12-31 (×3): qty 1

## 2014-12-31 MED ORDER — PANTOPRAZOLE SODIUM 40 MG PO TBEC
DELAYED_RELEASE_TABLET | ORAL | Status: AC
  Administered 2014-12-31: 40 mg via ORAL
  Filled 2014-12-31: qty 1

## 2014-12-31 MED ORDER — ONDANSETRON HCL 4 MG/2ML IJ SOLN: 4.0000 mg | Freq: Four times a day (QID) | INTRAMUSCULAR | Status: DC | PRN

## 2014-12-31 MED ORDER — DEXTROMETHORPHAN-QUINIDINE 20-10 MG PO CAPS
1.0000 | ORAL_CAPSULE | Freq: Two times a day (BID) | ORAL | Status: DC
  Administered 2014-12-31 – 2015-01-03 (×6): 1 via ORAL
  Filled 2014-12-31: qty 1

## 2014-12-31 MED ORDER — BUPROPION HCL ER (SR) 150 MG PO TB12
150.0000 mg | ORAL_TABLET | Freq: Four times a day (QID) | ORAL | Status: DC
  Administered 2015-01-01 – 2015-01-03 (×11): 150 mg via ORAL
  Filled 2014-12-31 (×11): qty 1

## 2014-12-31 MED ORDER — LEVETIRACETAM 500 MG PO TABS
ORAL_TABLET | ORAL | Status: AC
  Administered 2014-12-31: 1000 mg via ORAL
  Filled 2014-12-31: qty 2

## 2014-12-31 MED ORDER — DIAZEPAM 5 MG PO TABS
10.0000 mg | ORAL_TABLET | Freq: Four times a day (QID) | ORAL | Status: DC | PRN
  Administered 2015-01-01 (×2): 10 mg via ORAL
  Filled 2014-12-31 (×2): qty 2

## 2014-12-31 MED ORDER — ALBUTEROL SULFATE (2.5 MG/3ML) 0.083% IN NEBU: 2.5000 mg | INHALATION_SOLUTION | RESPIRATORY_TRACT | Status: DC | PRN

## 2014-12-31 MED ORDER — DIAZEPAM 5 MG PO TABS
ORAL_TABLET | ORAL | Status: AC
  Administered 2014-12-31: 10 mg via ORAL
  Filled 2014-12-31: qty 2

## 2014-12-31 MED ORDER — IBUPROFEN 800 MG PO TABS
ORAL_TABLET | ORAL | Status: AC
  Administered 2014-12-31: 800 mg via ORAL
  Filled 2014-12-31: qty 1

## 2014-12-31 MED ORDER — CLINDAMYCIN HCL 150 MG PO CAPS
ORAL_CAPSULE | ORAL | Status: AC
  Administered 2014-12-31: 300 mg via ORAL
  Filled 2014-12-31: qty 2

## 2014-12-31 MED ORDER — ONDANSETRON HCL 4 MG PO TABS: 4.0000 mg | ORAL_TABLET | Freq: Four times a day (QID) | ORAL | Status: DC | PRN

## 2014-12-31 MED ORDER — POTASSIUM CHLORIDE 20 MEQ PO PACK
PACK | ORAL | Status: AC
  Administered 2014-12-31: 40 meq via ORAL
  Filled 2014-12-31: qty 2

## 2014-12-31 NOTE — ED Notes (Signed)

## 2014-12-31 NOTE — ED Notes (Signed)
BEHAVIORAL HEALTH ROUNDING Patient sleeping: Yes.   Patient alert and oriented: not applicable Behavior appropriate: Yes.  ; If no, describe:  Nutrition and fluids offered: No, pt sleeping Toileting and hygiene offered: No, pt sleeping Sitter present: no Law enforcement present: Yes ODS

## 2014-12-31 NOTE — ED Notes (Signed)
In and out cathed pt, approx emptied from bladder. Pt tolerated well. Linens changed. Pt positioned for comfort. Bottom assessed, no redness or breakdown noted.

## 2014-12-31 NOTE — ED Notes (Signed)
BEHAVIORAL HEALTH ROUNDING Patient sleeping: No. Patient alert and oriented: yes Behavior appropriate: Yes.  ; If no, describe:  Nutrition and fluids offered: Yes  Toileting and hygiene offered: Yes  Sitter present: no Law enforcement present: Yes  

## 2014-12-31 NOTE — ED Notes (Signed)
Patient repositioned in bed.

## 2014-12-31 NOTE — Consult Note (Signed)
Palliative Medicine Inpatient Consult Note   Name: Jennifer [name] Date: 12/31/2014 MRN: [id number]  DOB: 05-May-1961  Referring Physician: No att. providers found  Palliative Care consult requested for this 54 y.o. female for goals of medical therapy in patient with major depressive disorder and ALS brought to ER for suicidal ideations and abdominal pain.    Jennifer [name] is a 54 year old female with major depressive disorder, h/o of suicidal ideations and attempts, and ALS.  Pt presented to ER with suicidal ideations a abdominal pain.  Pt kept in ER for observation and treatment of UTI.  Pt currently in ER and resting in bed comfortably.  Family at bedside.[name].    REVIEW OF SYSTEMS:  Pain: None Dyspnea:  No Nausea/Vomiting:  No Depression:   Yes Fatigue:   Yes All other systems were reviewed and found to be negative     SOCIAL HISTORY:Pt currently lives at home with husband and daughter.  reports that she has quit smoking. She has never used smokeless tobacco. She reports that she does not drink alcohol or use illicit drugs.  LEGAL DOCUMENTS:  none  CODE STATUS: Full code  PAST MEDICAL HISTORY: Past Medical History  Diagnosis Date  . ALS (amyotrophic lateral sclerosis)   . Depression     PAST SURGICAL HISTORY:  Past Surgical History  Procedure Laterality Date  . Cesarean section    . Nasal sinus surgery      ALLERGIES:  is allergic to bee venom; shellfish allergy; lipitor; oxycodone; prednisone; vicodin; and peanut-containing drug products.  MEDICATIONS:  Current Facility-Administered Medications  Medication Dose Route Frequency Provider Last Rate Last Dose  . ARIPiprazole (ABILIFY) tablet 2 mg  2 mg Oral Daily Audery AmelJohn T Clapacs, MD   2 mg at 12/31/14 0947  . benzonatate (TESSALON) capsule 100 mg  100 mg Oral BID PRN Phineas SemenGraydon Goodman, MD      . cholecalciferol (VITAMIN D) tablet 1,000 Units  1,000 Units Oral Daily Phineas SemenGraydon Goodman, MD   1,000 Units at 12/31/14 0946  .  clindamycin (CLEOCIN) capsule 300 mg  300 mg Oral BID Phineas SemenGraydon Goodman, MD   300 mg at 12/31/14 0945  . diazepam (VALIUM) tablet 10 mg  10 mg Oral Q6H PRN Phineas SemenGraydon Goodman, MD   10 mg at 12/31/14 0948  . famotidine (PEPCID) tablet 20 mg  20 mg Oral Daily PRN Phineas SemenGraydon Goodman, MD   20 mg at 12/29/14 1237  . fluticasone (FLONASE) 50 MCG/ACT nasal spray 2 spray  2 spray Each Nare BID PRN Phineas SemenGraydon Goodman, MD      . gabapentin (NEURONTIN) capsule 300 mg  300 mg Oral BID Phineas SemenGraydon Goodman, MD   300 mg at 12/31/14 0946  . ibuprofen (ADVIL,MOTRIN) tablet 800 mg  800 mg Oral TID Phineas SemenGraydon Goodman, MD   800 mg at 12/31/14 0947  . lactulose (CHRONULAC) 10 GM/15ML solution 10 g  10 g Oral Festus HoltsQODAY Graydon Goodman, MD   10 g at 12/30/14 0943  . levETIRAcetam (KEPPRA) tablet 1,000 mg  1,000 mg Oral BID Phineas SemenGraydon Goodman, MD   1,000 mg at 12/31/14 0946  . loratadine (CLARITIN) tablet 10 mg  10 mg Oral Daily Phineas SemenGraydon Goodman, MD   10 mg at 12/31/14 0947  . mirtazapine (REMERON) tablet 30 mg  30 mg Oral QHS Audery AmelJohn T Clapacs, MD   30 mg at 12/30/14 2156  . pantoprazole (PROTONIX) EC tablet 40 mg  40 mg Oral Daily Phineas SemenGraydon Goodman, MD   40 mg at 12/31/14 0947  .  tiZANidine (ZANAFLEX) tablet 2 mg  2 mg Oral QHS Governor Rooks, MD   2 mg at 12/30/14 2159   Current Outpatient Prescriptions  Medication Sig Dispense Refill  . benzonatate (TESSALON) 100 MG capsule TAKE ONE CAPSULE BY MOUTH TWICE A DAY AS NEEDED FOR COUGH 20 capsule 0  . buPROPion (WELLBUTRIN XL) 300 MG 24 hr tablet Take 300 mg by mouth 2 (two) times daily.    . cholecalciferol (VITAMIN D) 1000 UNITS tablet Take 1,000 Units by mouth daily.    . clindamycin (CLEOCIN) 300 MG capsule Take 300 mg by mouth 2 (two) times daily.    [name] Dextromethorphan-Quinidine (NUEDEXTA) 20-10 MG CAPS Take 1 capsule by mouth 2 (two) times daily.     . diazepam (VALIUM) 10 MG tablet TAKE 1/2 - 1 TABLET EVERY 6 HOURS AS NEEDED 60 tablet 0  . fluticasone (FLONASE) 50 MCG/ACT nasal spray USE 2  SPRAYS IN EACH NOSTRIL EVERY DAY AS DIRECTED (Patient taking differently: Place 2 sprays into both nostrils 2 (two) times daily as needed for allergies or rhinitis. ) 16 g 5  . ibuprofen (ADVIL,MOTRIN) 200 MG tablet Take 800 mg by mouth 3 (three) times daily.    [name] lactulose (CHRONULAC) 10 GM/15ML solution Take 10 g by mouth every other day.    . levETIRAcetam (KEPPRA) 500 MG tablet Take 1,000 mg by mouth 2 (two) times daily.    [name] loratadine (CLARITIN) 10 MG tablet Take 10 mg by mouth daily. Take one tablet by mouth daily as needed    . omeprazole (PRILOSEC) 40 MG capsule Take 40 mg by mouth daily as needed (for acid reflex).     . ranitidine (ZANTAC) 150 MG capsule Take 150 mg by mouth daily as needed for heartburn.     . tizanidine (ZANAFLEX) 2 MG capsule Take 2 mg by mouth daily.    [name] ALPRAZolam (XANAX) 0.25 MG tablet TAKE 2 TABLETS BY MOUTH 3 TIMES A DAY AS NEEDED 180 tablet 0  . buPROPion (WELLBUTRIN SR) 150 MG 12 hr tablet TAKE 1 TABLET (150 MG TOTAL) BY MOUTH 4 (FOUR) TIMES DAILY. (Patient taking differently: No sig reported) 120 tablet 1  . OVER THE COUNTER MEDICATION Take 1 tablet by mouth daily. *Mitoq*    . OVER THE COUNTER MEDICATION Take 1 tablet by mouth at bedtime. *Protandum*    . sertraline (ZOLOFT) 50 MG tablet TAKE 1 TABLET BY MOUTH EVERY DAY 30 tablet 2    Vital Signs: BP 114/75 mmHg  Pulse 83  Temp(Src) 97.5 F (36.4 C) (Oral)  Resp 16  Ht  (1.499 m)  Wt 55.475 kg (122 lb 4.8 oz)  BMI 24.69 kg/m2  SpO2 97% Filed Weights   12/28/14 0743  Weight: 55.475 kg (122 lb 4.8 oz)    Estimated body mass index is 24.69 kg/(m^2) as calculated from the following:   Height as of this encounter:  (1.499 m).   Weight as of this encounter: 55.475 kg (122 lb 4.8 oz).  PERFORMANCE STATUS (ECOG) : 4 - Bedbound  PHYSICAL EXAM: [place]: NAD HEENT: OP clear, moist oral mucosa Neck: Trachea midline  Cardiovascular: RRR Pulmonary/Chest: Clear ant fields Abdominal:  Soft, NTTP. Hypoactive bowel sounds GU: No SP tenderness, No CVA tenderness Extremities: No edema  Neurological: Grossly nonfocal Skin: Warm, dry and intact.  Psychiatric: Calm, A&O x 3  LABS: CBC:    Component Value Date/Time   WBC 7.1 12/27/2014 1422   WBC 9.8 10/15/2013 0545   HGB  13.1 12/27/2014 1422   HGB 12.4 10/15/2013 0545   HCT 39.7 12/27/2014 1422   HCT 35.7 10/15/2013 0545   PLT 238 12/27/2014 1422   PLT 213 10/15/2013 0545   MCV 95.1 12/27/2014 1422   MCV 94 10/15/2013 0545   NEUTROABS 8.4* 06/07/2014 1203   NEUTROABS 8.1* 10/15/2013 0545   LYMPHSABS 1.7 06/07/2014 1203   LYMPHSABS 1.2 10/15/2013 0545   MONOABS 0.4 06/07/2014 1203   MONOABS 0.4 10/15/2013 0545   EOSABS 0.1 06/07/2014 1203   EOSABS 0.1 10/15/2013 0545   BASOSABS 0.0 06/07/2014 1203   BASOSABS 0.0 10/15/2013 0545   Comprehensive Metabolic Panel:    Component Value Date/Time   NA 139 12/27/2014 1422   NA 140 10/15/2013 0545   K 4.0 12/27/2014 1422   K 3.5 10/15/2013 0545   CL 103 12/27/2014 1422   CL 110* 10/15/2013 0545   CO2 28 12/27/2014 1422   CO2 25 10/15/2013 0545   BUN 18 12/27/2014 1422   BUN 10 10/15/2013 0545   CREATININE 0.91 12/27/2014 1422   CREATININE 0.63 10/15/2013 0545   GLUCOSE 86 12/27/2014 1422   GLUCOSE 71 10/15/2013 0545   CALCIUM 9.7 12/27/2014 1422   CALCIUM 8.7 10/15/2013 0545   AST 17 12/27/2014 1422   AST 30 10/14/2013 1716   ALT 12* 12/27/2014 1422   ALT 20 10/14/2013 1716   ALKPHOS 46 12/27/2014 1422   ALKPHOS 62 10/14/2013 1716   BILITOT 0.5 12/27/2014 1422   PROT 7.7 12/27/2014 1422   PROT 7.9 10/14/2013 1716   ALBUMIN 4.5 12/27/2014 1422   ALBUMIN 4.1 10/14/2013 1716    IMPRESSION:  Jennifer [name] is a 54 year old female with major depressive disorder, h/o of suicidal ideations and attempts, and ALS.  Pt presented to ER with suicidal ideations a abdominal pain.  Pt kept in ER for observation and treatment of UTI.  Pt currently in ER.  Family at  bedside..   Palliative medicine requested by pt.  Spoke with pt about request and pt states she does not need [place] anymore.  Pt does agree to goals of care discussion after explaining palliative medicine.  Pt has no symptoms.  Spoke with pt about her ALS disease progression.  Pt and daughter both agree home health services with transition into hospice is appropriate for pt.  Pt is currently being seen by psych and will defer to them for any psych issues.  CM notified and consult put in for home health.    PLAN: 1. Home with home health when stable  REFERRALS TO BE ORDERED:  care management   More than 50% of the visit was spent in counseling/coordination of care: Yes  Time Spent: 75 minutes

## 2014-12-31 NOTE — ED Notes (Signed)
MD at bedside due to pt becoming unresponsive to verbal stimulation. Pt placed on NRB mask.

## 2014-12-31 NOTE — Care Management Note (Signed)
Case Management Note  Patient Details  Name: Sula RumpleKatherine Matheney MRN: [id number] Date of Birth: 20-Dec-1960  Subjective/Objective:   Spoke to pt. Spouse Wandalee FerdinandDon Avakian out of pt. Room. He has been working [place] [facility] [employer], and has been denied 2 x already.  I have explained     To him that we are here and expecting to provide an order at d/c for home health PT, and SW through the home health agency that also provides Hospice care so  That when needed the pt. Can traqnsition.He is in agreement . The spouse is hoping for late afternoon release so he can attend the daughters graduation. Bevelyn NgoStephanie Bowen made aware of conversation.       Action/Plan:   Expected Discharge Date:                  Expected Discharge Plan:     In-House Referral:     Discharge planning Services     Post Acute Care Choice:    Choice offered to:     DME Arranged:    DME Agency:     HH Arranged:    HH Agency:     Status of Service:     Medicare Important Message Given:    Date Medicare IM Given:    Medicare IM give by:    Date Additional Medicare IM Given:    Additional Medicare Important Message give by:     If discussed at Long Length of Stay Meetings, dates discussed:    Additional Comments:  Berna BueCheryl Onyx Edgley, RN 12/31/2014, 12:31 PM

## 2014-12-31 NOTE — Care Management (Signed)
Spoke with Patient's husband Dorinda HillDonald.  He is in agreement to establish Home Health Services with [facility].  Spoke with Angela Adamrew Wilkie from [facility].  Referral completed for RN, PT and social work. MD placed order and completed Home Health Face to Face.  I left a discharge planning sheet with [facility] contact information to be provided to the patient's Husband upon discharge.

## 2014-12-31 NOTE — ED Notes (Signed)
Attempted to provide thickened water to pt, pt did not want it.

## 2014-12-31 NOTE — ED Notes (Signed)
Assumed care of pt. Pt sleeping soundly, resp even and unlabored. Pt in hospital bed. NAD.

## 2014-12-31 NOTE — ED Notes (Signed)
BEHAVIORAL HEALTH ROUNDING Patient sleeping: Yes.   Patient alert and oriented: not applicable Behavior appropriate: Yes.  ; If no, describe:  Nutrition and fluids offered: No and Pt sleeping Toileting and hygiene offered: No and Pt sleeping Sitter present: no Law enforcement present: Yes ODS

## 2014-12-31 NOTE — ED Provider Notes (Addendum)
-----------------------------------------   10:55 AM on 12/31/2014 -----------------------------------------  I was asked see the patient about 20 minutes ago by the nurse because she reported that her tongue felt funny after taking her morning medications. On exam, she demonstrates no lingular edema, no oral edema, denies itching, denies any difficulty breathing. She does feel like her tongue is slightly tingly, but I see no overt evidence to suggest acute abnormality and/or allergic reaction.  We will continue to observe her and await psychiatric consultation. She is hemodynamically stable.  ----------------------------------------- 3:05 PM on 12/31/2014 -----------------------------------------  Had extensive evaluations by palliative care, psychiatry both of whom note that the patient does not meet inpatient criteria. In addition Child psychotherapistsocial worker and case management have been involved. The patient is quite stable in the ER. At present her only ongoing immediate need his occasional urinary catheterization. I discussed extensively with the patient's husband, he states that he would be unable to provide catheterizations at home but may be old provide Foley care. I will insert a Foley catheter because the patient has been requiring catheterization for about the last 7 weeks [place]. We will provide training to the husband and patient on care for the Foley catheter.  In addition I spoke with [facility] who knows the patient well her phone number [phone]. They will be able to see Mrs. Krummel in clinic tomorrow at 12:30 PM  Plan at this point is insert Foley catheter, provide education and training on use, and have the patient follow-up with the [facility] clinic tomorrow at 12:30 PM.  ----------------------------------------- 3:12 PM on 12/31/2014 -----------------------------------------  We will insert Foley catheter here. The patient is quite stable. Will  follow-up with [facility] tomorrow at 12:30 PM. Husband will be taking the patient home at around 7 PM after returning from daughter's graduation.  Care and further disposition assigned to Dr. Glenetta HewMcLaurin.  ----------------------------------------- 4:23 PM on 12/31/2014 -----------------------------------------  Asked to see The patient has she was not responding the nursing staff. She was previously seen by myself at 11 AM and in no distress, and also seen later by Dr. Toni Amendlapacs and had no evidence of acute sedation at that time.   oN reevaluation, the patient is saturating normally on room air, she does have decreased mental status. She is LETHARGIC but now unarousable. Her pupils are notably constricted bilaterally. To verbal stimuli the patient is able to follow commands, she opens her mouth, sticks out her tongue, and moves her arms to request and protects airway well. However, she does appear quite sedate. Sugar is normal. Vital signs repeated. At this point, etiology is not exactly clear as to why the patient has suddenly become so somnolent/lethargic however suspect this may be related to the scheduled medication she has been on. I have discontinued scheduled Valium and Neurontin. Dr. Glenetta HewMcLaurin evaluated the patient in concert with myself and is assuming ongoing care. Additional lab evaluation, head CT ordered by Dr. Glenetta HewMcLaurin who will follow-up closely on results.    Sharyn CreamerMark Ellington Cornia, MD 12/31/14 1512  Sharyn CreamerMark Ricci Paff, MD 12/31/14 [phone]
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
## 2014-12-31 NOTE — ED Provider Notes (Signed)
-----------------------------------------   4:32 PM on 12/31/2014 ----------------------------------------- I saw & evaluated pt with Dr. Fanny BienQuale, from whom I'm assuming care of [name], & will f/u on results for AMS, which Dr. Fanny BienQuale suspects is to overmedication. During episode, pt with disconjugate gaze and unresponsive.  Extended B arms while unresponsive, but was not posturing.  Opened eyes after several minutes with Dr. Fanny BienQuale & followed commands such as stick out tongue.  But still seemed sedated.    FSBS-103; cbc-nl; metC-nl x/k-3.0  ; ammonia-15; keppra-P  ED ECG REPORT I, Maurilio LovelyMcLaurin,  Tarrin Lebow, the attending physician, personally viewed and interpreted this ECG.   Date: 12/31/2014  EKG Time: 1655  Rate: 77  Rhythm:NSR  Axis:nl  Intervals:nl  ST&T Change:T flat avL  CT head-NAD  ----------------------------------------- 5:36 PM on 12/31/2014 ----------------------------------------- Pt now awake.  Per RN, Victorino DikeJennifer, who spoke with daughter this morning, pt speech difficult to understand at baseline.  Pt appears now the way she did earlier this AM.  ----------------------------------------- 7:52 PM on 12/31/2014 -----------------------------------------  Patient now awake and requesting Foley catheter to be removed. Patient was brought into the emergency department for urinary retention. Patient unable to perform in and out self- catheterizations.  Patient has been seen by psychiatry, palliative care, and social work. Patient only qualifies for home health care 12 hours a day & unclear who could do I&Os the remaining 12 hr.    ----------------------------------------- 8:15 PM on 12/31/2014 -----------------------------------------  [name].They state patient is much more sedated and not talking as well as baseline. They are very concerned that she was [facility] and they suspect these medications are having a sedating effect  on her; Dr. Fanny BienQuale had already discontinued these.  They provided patient's home medication for her ALS, Neudexta 20-10mg , which is supposed to be given bid, but has yet to be given.  I have called pharmacy & they will enter as a verbal order & provide to her.  Pt has had ALS for 8 yrs & [facility].  Per daughter, she is doing very well for an ALS pt with a very slow disease progression.   As for plan for I&O caths, they plan to have a private RN do her caths for the 12hrs a day & do not want a foley.    I have discussed episode of unconsciousness with Dr. Illene Silverhen/prime doc.  He will admit for further monitoring.  [facility].    (delayed documentation)  She was admitted for altered mental status, syncope, urinary retention.  Maurilio LovelyNoelle Zooey Schreurs, MD 12/31/14 2221

## 2014-12-31 NOTE — ED Notes (Signed)
BEHAVIORAL HEALTH ROUNDING  Patient sleeping: No.  Patient alert and oriented: Yes, alert  Behavior appropriate: Yes. ; If no, describe:  Nutrition and fluids offered: Yes  Toileting and hygiene offered: Yes  Sitter present: No Law enforcement present: Yes ODS

## 2014-12-31 NOTE — ED Notes (Signed)
BEHAVIORAL HEALTH ROUNDING Patient sleeping: No. Patient alert and oriented: yes Behavior appropriate: Yes.  ; If no, describe: Pt tearful at this time.  Nutrition and fluids offered: Yes  Toileting and hygiene offered: Yes  Sitter present: no Law enforcement present: Yes ODS

## 2014-12-31 NOTE — ED Notes (Signed)
BEHAVIORAL HEALTH ROUNDING Patient sleeping: Yes.   Patient alert and oriented: not applicable Behavior appropriate: Yes.  ; If no, describe: Nutrition and fluids offered: NO, pt sleeping  Toileting and hygiene offered: Pt sleeping Sitter present: No Law enforcement present: Yes

## 2014-12-31 NOTE — ED Notes (Addendum)
BEHAVIORAL HEALTH ROUNDING Patient sleeping: No. Patient alert and oriented: yes Behavior appropriate: Yes.  ; If no, describe:  Nutrition and fluids offered: Yes  Toileting and hygiene offered: Yes  Sitter present: yes Law enforcement present: Yes  

## 2014-12-31 NOTE — H&P (Signed)
[facility] - [place] at [facility]   PATIENT NAME: Jennifer [name]    MR#:  [id number]  DATE OF BIRTH:  1960-08-12  DATE OF ADMISSION:  12/28/2014  PRIMARY CARE PHYSICIAN: Ruthe Mannan, MD   REQUESTING/REFERRING PHYSICIAN: Maurilio Lovely, MD  CHIEF COMPLAINT:   Chief Complaint  Patient presents with  . Urinary Retention    HISTORY OF PRESENT ILLNESS:  Jennifer [name]  is a 54 y.o. female with a known history of ALS and GERD.  The patient was brought to the ED due to urinary retention. In addition patient has a suicidal ideations and was placed on IVC. She has been seen by psychiatry, para care and the social worker in ED for the past few days. The final dissection was discharged to home with home health and catheter in and out. But the patient was found disc conjugate gaze and unresponsive for 15 minutes per Dr. Glenetta Hew. Patient now is alert awake but is still confused. Oriented to patient's husband and daughter, this is a not the patient's baseline. They said the patient has some moles slurred speech. We're very concerned that the patient was started on Abilify and the Neurontin the ED, which may set dated the patient. Since patient has 1 episode of unresponsiveness, Dr. Glenetta Hew suggest that admitted patient for observation. PAST MEDICAL HISTORY:   Past Medical History  Diagnosis Date  . ALS (amyotrophic lateral sclerosis)   . Depression     PAST SURGICAL HISTORY:   Past Surgical History  Procedure Laterality Date  . Cesarean section    . Nasal sinus surgery      SOCIAL HISTORY:   History  Substance Use Topics  . Smoking status: Former Games developer  . Smokeless tobacco: Never Used  . Alcohol Use: No    FAMILY HISTORY:   Family History  Problem Relation Age of Onset  . COPD Mother   . Thyroid disease Mother   . Diverticulosis Mother   . Heart attack Mother   . Gout Father   . Depression Sister   . Thyroid disease Sister   . Cancer Brother      kidney  . Heart attack Maternal Grandmother   . Heart attack Maternal Grandfather   . Heart attack Paternal Grandfather   . Hyperlipidemia Sister   . Evelene Croon Parkinson White syndrome Sister 25  . Depression Brother   . Hypertension Brother     DRUG ALLERGIES:   Allergies  Allergen Reactions  . Bee Venom Swelling    Swelling at site   . Shellfish Allergy Anaphylaxis  . Lipitor [Atorvastatin] Other (See Comments)    Pt sts muscle discomfort  . Oxycodone Other (See Comments)    Muscle stiff   . Prednisone Other (See Comments)    psychosis  . Vicodin [Hydrocodone-Acetaminophen] Other (See Comments)    Muscle stiff   . Peanut-Containing Drug Products Diarrhea    REVIEW OF SYSTEMS:  The patient is a confused and difficult to communicate, unable to get a review of system.  MEDICATIONS AT HOME:   Prior to Admission medications   Medication Sig Start Date End Date Taking? Authorizing Provider  benzonatate (TESSALON) 100 MG capsule TAKE ONE CAPSULE BY MOUTH TWICE A DAY AS NEEDED FOR COUGH 09/24/14  Yes [name], MD  buPROPion (WELLBUTRIN XL) 300 MG 24 hr tablet Take 300 mg by mouth 2 (two) times daily.   Yes Historical Provider, MD  cholecalciferol (VITAMIN D) 1000 UNITS tablet Take 1,000 Units by  mouth daily.   Yes Historical Provider, MD  clindamycin (CLEOCIN) 300 MG capsule Take 300 mg by mouth 2 (two) times daily.   Yes Historical Provider, MD  Dextromethorphan-Quinidine (NUEDEXTA) 20-10 MG CAPS Take 1 capsule by mouth 2 (two) times daily.    Yes Historical Provider, MD  diazepam (VALIUM) 10 MG tablet TAKE 1/2 - 1 TABLET EVERY 6 HOURS AS NEEDED 11/26/14  Yes Dianne Dunalia M Aron, MD  fluticasone (FLONASE) 50 MCG/ACT nasal spray USE 2 SPRAYS IN EACH NOSTRIL EVERY DAY AS DIRECTED Patient taking differently: Place 2 sprays into both nostrils 2 (two) times daily as needed for allergies or rhinitis.  07/26/14  Yes Dianne Dunalia M Aron, MD  ibuprofen (ADVIL,MOTRIN) 200 MG tablet Take 800 mg by  mouth 3 (three) times daily.   Yes Historical Provider, MD  lactulose (CHRONULAC) 10 GM/15ML solution Take 10 g by mouth every other day.   Yes Historical Provider, MD  levETIRAcetam (KEPPRA) 500 MG tablet Take 1,000 mg by mouth 2 (two) times daily.   Yes Historical Provider, MD  loratadine (CLARITIN) 10 MG tablet Take 10 mg by mouth daily. Take one tablet by mouth daily as needed   Yes Historical Provider, MD  omeprazole (PRILOSEC) 40 MG capsule Take 40 mg by mouth daily as needed (for acid reflex).    Yes Historical Provider, MD  ranitidine (ZANTAC) 150 MG capsule Take 150 mg by mouth daily as needed for heartburn.    Yes Historical Provider, MD  tizanidine (ZANAFLEX) 2 MG capsule Take 2 mg by mouth daily.   Yes Historical Provider, MD  ALPRAZolam (XANAX) 0.25 MG tablet TAKE 2 TABLETS BY MOUTH 3 TIMES A DAY AS NEEDED 11/12/14   Dianne Dunalia M Aron, MD  buPROPion (WELLBUTRIN SR) 150 MG 12 hr tablet TAKE 1 TABLET (150 MG TOTAL) BY MOUTH 4 (FOUR) TIMES DAILY. Patient taking differently: No sig reported 11/15/14   Dianne Dunalia M Aron, MD  OVER THE COUNTER MEDICATION Take 1 tablet by mouth daily. *Mitoq*    Historical Provider, MD  OVER THE COUNTER MEDICATION Take 1 tablet by mouth at bedtime. *Protandum*    Historical Provider, MD  sertraline (ZOLOFT) 50 MG tablet TAKE 1 TABLET BY MOUTH EVERY DAY 11/15/14   Dianne Dunalia M Aron, MD      VITAL SIGNS:  Blood pressure 97/74, pulse 80, temperature 97.5 F (36.4 C), temperature source Oral, resp. rate 23, height 4\' 11"  (1.499 m), weight 55.475 kg (122 lb 4.8 oz), SpO2 96 %.  PHYSICAL EXAMINATION:  GENERAL:  54 y.o.-year-old patient lying in the bed with no acute distress.  EYES: Pupils equal, round, reactive to light and accommodation. No scleral icterus. Extraocular muscles intact.  HEENT: Head atraumatic, normocephalic. Oropharynx and nasopharynx clear.  NECK:  Supple, no jugular venous distention. No thyroid enlargement, no tenderness.  LUNGS: Normal breath sounds  bilaterally, no wheezing, rales,rhonchi or crepitation. No use of accessory muscles of respiration.  CARDIOVASCULAR: S1, S2 normal. No murmurs, rubs, or gallops.  ABDOMEN: Soft, nontender, nondistended. Bowel sounds present. No organomegaly or mass.  EXTREMITIES: No pedal edema, cyanosis, or clubbing.  NEUROLOGIC: Cranial nerves II through XII are intact. Muscle strength 5/5 in all extremities. Sensation intact. Gait not checked.  PSYCHIATRIC: The patient is alert and looks confused.  SKIN: No obvious rash, lesion, or ulcer.   LABORATORY PANEL:   CBC  Recent Labs Lab 12/31/14 1659  WBC 7.0  HGB 13.1  HCT 39.2  PLT 216   ------------------------------------------------------------------------------------------------------------------  Chemistries   Recent  Labs Lab 12/31/14 1659  NA 145  K 3.0*  CL 109  CO2 25  GLUCOSE 102*  BUN 18  CREATININE 0.86  CALCIUM 9.6  AST 17  ALT 11*  ALKPHOS 37*  BILITOT 0.6   ------------------------------------------------------------------------------------------------------------------  Cardiac Enzymes No results for input(s): TROPONINI in the last 168 hours. ------------------------------------------------------------------------------------------------------------------  RADIOLOGY:  Ct Head Wo Contrast  12/31/2014   CLINICAL DATA:  Unresponsive  EXAM: CT HEAD WITHOUT CONTRAST  TECHNIQUE: Contiguous axial images were obtained from the base of the skull through the vertex without intravenous contrast.  COMPARISON:  None.  FINDINGS: There is no evidence of mass effect, midline shift, or extra-axial fluid collections. There is no evidence of a space-occupying lesion or intracranial hemorrhage. There is no evidence of a cortical-based area of acute infarction. There is generalized cerebral atrophy. There is periventricular white matter low attenuation likely secondary to microangiopathy.  The ventricles and sulci are appropriate for the  patient's age. The basal cisterns are patent.  Visualized portions of the orbits are unremarkable. The visualized portions of the paranasal sinuses and mastoid air cells are unremarkable.  The osseous structures are unremarkable.  IMPRESSION: 1. No acute intracranial pathology. 2. Chronic microvascular disease and cerebral atrophy.   Electronically Signed   By: Elige Ko   On: 12/31/2014 16:40    EKG:   Orders placed or performed during the hospital encounter of 12/28/14  . ED EKG  . ED EKG  . EKG 12-Lead  . EKG 12-Lead  . ED EKG  . ED EKG    IMPRESSION AND PLAN:   Unresponsiveness Hypokalemia ALS  The patient will be placed for observation. I will get a neurology consult and neuro check. The patient was given potassium in ED, follow-up BMP. Continue patient home medication.    All the records are reviewed and case discussed with ED provider. Management plans discussed with the patient's husband and daughter, family and they are in agreement.  CODE STATUS: Full code TOTAL TIME TAKING CARE OF THIS PATIENT: 63 minutes.    Shaune Pollack M.D on 12/31/2014 at 8:46 PM  Between 7am to 6pm - Pager - [phone]  After 6pm go to [email or web address] - password [email or web address] [facility]  [facility]  Office  [phone]  CC: Primary care physician; Ruthe Mannan, MD

## 2014-12-31 NOTE — ED Notes (Addendum)
No change in pt condition. Pt unresponsive to painful stimuli but will moan occasionally.

## 2014-12-31 NOTE — ED Notes (Signed)
BEHAVIORAL HEALTH ROUNDING Patient sleeping: Yes.   Patient alert and oriented: not applicable Behavior appropriate: Yes.  ; If no, describe Nutrition and fluids offered:Pt sleeping Toileting and hygiene offered: YES Sitter present: NO Law enforcement present:YES 

## 2014-12-31 NOTE — ED Notes (Signed)
RN transport to CT  

## 2014-12-31 NOTE — ED Notes (Addendum)
BEHAVIORAL HEALTH ROUNDING Patient sleeping: Yes.   Patient alert and oriented: not applicable Behavior appropriate: Yes.  ; If no, describe Nutrition and fluids offered:Pt sleeping Toileting and hygiene offered: YES Sitter present: NO Patent examinerLaw enforcement present:YES

## 2014-12-31 NOTE — ED Notes (Signed)
Patient's daughter states patient is having tongue swelling and drooling. Patient is positioned upright in bed with no noted difficulty breathing. Dr. Fanny BienQuale to bedside, patient examined with no signs of allergic reaction. Patient suctioned with very little saliva suctioned out. Patient's sats at 99% on RA. Daughter also states patient seems sedated. Notified patient received Valium per patient request for muscle spasms. Patient's daughter advised we will reconsider dosing of Valium if patient continues to be sedated.

## 2015-01-01 DIAGNOSIS — G1221 Amyotrophic lateral sclerosis: Secondary | ICD-10-CM | POA: Diagnosis not present

## 2015-01-01 DIAGNOSIS — F333 Major depressive disorder, recurrent, severe with psychotic symptoms: Secondary | ICD-10-CM | POA: Diagnosis not present

## 2015-01-01 LAB — BASIC METABOLIC PANEL
Anion gap: 7 (ref 5–15)
BUN: 15 mg/dL (ref 6–20)
CALCIUM: 9 mg/dL (ref 8.9–10.3)
CO2: 23 mmol/L (ref 22–32)
CREATININE: 0.75 mg/dL (ref 0.44–1.00)
Chloride: 111 mmol/L (ref 101–111)
GFR calc non Af Amer: >60 mL/min (ref >60)
Glucose, Bld: 103 mg/dL — ABNORMAL HIGH (ref 65–99)
POTASSIUM: 3.6 mmol/L (ref 3.5–5.1)
Sodium: 141 mmol/L (ref 135–145)

## 2015-01-01 LAB — CBC
HCT: 37.1 % (ref 35.0–47.0)
Hemoglobin: 12.6 g/dL (ref 12.0–16.0)
MCH: 32.4 pg (ref 26.0–34.0)
MCHC: 34 g/dL (ref 32.0–36.0)
MCV: 95.3 fL (ref 80.0–100.0)
Platelets: 192 10*3/uL (ref 150–440)
RBC: 3.9 MIL/uL (ref 3.80–5.20)
RDW: 13.3 % (ref 11.5–14.5)
WBC: 7 10*3/uL (ref 3.6–11.0)

## 2015-01-01 MED ORDER — KETOROLAC TROMETHAMINE 15 MG/ML IJ SOLN
15.0000 mg | Freq: Three times a day (TID) | INTRAMUSCULAR | Status: DC | PRN
  Administered 2015-01-01 – 2015-01-02 (×2): 15 mg via INTRAVENOUS
  Filled 2015-01-01 (×2): qty 1

## 2015-01-01 MED ORDER — LEVETIRACETAM 500 MG PO TABS
500.0000 mg | ORAL_TABLET | Freq: Two times a day (BID) | ORAL | Status: DC
  Administered 2015-01-01 – 2015-01-03 (×4): 500 mg via ORAL
  Filled 2015-01-01 (×4): qty 1

## 2015-01-01 MED ORDER — DIAZEPAM 5 MG PO TABS
10.0000 mg | ORAL_TABLET | Freq: Three times a day (TID) | ORAL | Status: DC | PRN
  Administered 2015-01-02 – 2015-01-03 (×4): 10 mg via ORAL
  Filled 2015-01-01 (×4): qty 2

## 2015-01-01 MED ORDER — BENZONATATE 100 MG PO CAPS
100.0000 mg | ORAL_CAPSULE | Freq: Three times a day (TID) | ORAL | Status: DC | PRN
  Administered 2015-01-01 (×2): 100 mg via ORAL
  Filled 2015-01-01 (×2): qty 1

## 2015-01-01 MED ORDER — ENOXAPARIN SODIUM 40 MG/0.4ML ~~LOC~~ SOLN
40.0000 mg | SUBCUTANEOUS | Status: DC
Start: 2015-01-01 — End: 2015-01-03
  Administered 2015-01-01 – 2015-01-03 (×3): 40 mg via SUBCUTANEOUS
  Filled 2015-01-01 (×3): qty 0.4

## 2015-01-01 MED ORDER — ARIPIPRAZOLE 5 MG PO TABS
5.0000 mg | ORAL_TABLET | Freq: Every day | ORAL | Status: DC
  Administered 2015-01-03: 5 mg via ORAL
  Filled 2015-01-01 (×2): qty 1

## 2015-01-01 MED ORDER — DIPHENHYDRAMINE HCL 25 MG PO CAPS
25.0000 mg | ORAL_CAPSULE | Freq: Every evening | ORAL | Status: DC | PRN
  Administered 2015-01-01 – 2015-01-02 (×2): 25 mg via ORAL

## 2015-01-01 MED ORDER — DIPHENHYDRAMINE HCL 25 MG PO CAPS
25.0000 mg | ORAL_CAPSULE | Freq: Every evening | ORAL | Status: DC | PRN
  Filled 2015-01-01: qty 1
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
## 2015-01-01 NOTE — Clinical Social Work Placement (Signed)
   CLINICAL SOCIAL WORK PLACEMENT  NOTE  Date:  01/01/2015  Patient Details  Name: Jennifer [name] MRN: [id number] Date of Birth: 01-Oct-1960  Clinical Social Work is seeking post-discharge placement for this patient at the Skilled  Nursing Facility level of care (*CSW will initial, date and re-position this form in  chart as items are completed):  Yes   Patient/family provided with [place] Clinical Social Work Department's list of facilities offering this level of care within the geographic area requested by the patient (or if unable, by the patient's family).  Yes   Patient/family informed of their freedom to choose among providers that offer the needed level of care, that participate in Medicare, Medicaid or managed care program needed by the patient, have an available bed and are willing to accept the patient.  Yes   Patient/family informed of [place]'s ownership interest [facility] and [facility], as well as of the fact that they are under no obligation to receive care at these facilities.  PASRR submitted to EDS on 01/01/15     PASRR number received on 01/01/15     Existing PASRR number confirmed on       FL2 transmitted to all facilities in geographic area requested by pt/family on 01/01/15     FL2 transmitted to all facilities within larger geographic area on       Patient informed that his/her managed care company has contracts with or will negotiate with certain facilities, including the following:            Patient/family informed of bed offers received.  Patient chooses bed at       Physician recommends and patient chooses bed at      Patient to be transferred to   on  .  Patient to be transferred to facility by       Patient family notified on   of transfer.  Name of family member notified:        PHYSICIAN       Additional Comment:    _______________________________________________ Haig ProphetMorgan, Johnni Wunschel G, LCSW 01/01/2015, 4:55 PM

## 2015-01-01 NOTE — Evaluation (Addendum)
Physical Therapy Evaluation Patient Details Name: Jennifer [name] MRN: [id number] DOB: 11/11/1960 Today's Date: 01/01/2015   History of Present Illness  Patient is a 54 y/o female that has a history of ALS and is admitted currently for urinary retention, and was deemed to have suicidal ideations in the ED and placed on the floor for possible involuntary commitment.   Clinical Impression  Patient presents with a long standing history of ALS, and has been followed for some time by [facility]. She presents today with urinary retention, and per the family she has been having a fairly steady decline in her mobility skills over the last month or 2. She was able to use a lift chair and ambulate short distances independently in the home as of ~ 2 months ago, but during today's session she fatigued after roughly 8 feet and needed heavy cga x 1 and cuing to maintain her balance and produce meaningful steps. She initially takes very small steps, dragging her feet on the ground, but with cuing to increase her step length she is able to make floor clearance bilaterally. At her current level of function, she does not have the home set-up or assistance to make meaningful mobility improvements or move about her home safely and would benefit from short term rehabilitation to increase her strength and endurance. Skilled acute PT services are indicated at this time to address the above deficits.     Follow Up Recommendations SNF    Equipment Recommendations       Recommendations for Other Services Speech consult     Precautions / Restrictions Precautions Precautions: Fall Restrictions Weight Bearing Restrictions: No      Mobility  Bed Mobility Overal bed mobility: Needs Assistance Bed Mobility: Supine to Sit     Supine to sit: Max assist        Transfers Overall transfer level: Needs assistance Equipment used: 4-wheeled walker Transfers: Sit to/from Stand Sit to Stand: Mod assist             Ambulation/Gait Ambulation/Gait assistance: Min assist Ambulation Distance (Feet): 8 Feet Assistive device: 4-wheeled walker Gait Pattern/deviations: Scissoring;Steppage;Decreased step length - left;Decreased step length - right;Trunk flexed;Staggering right;Staggering left;Decreased stride length;Step-to pattern;Narrow base of support   Gait velocity interpretation: <1.8 ft/sec, indicative of risk for recurrent falls    Stairs            Wheelchair Mobility    Modified Rankin (Stroke Patients Only)       Balance Overall balance assessment: Needs assistance Sitting-balance support: Feet supported;Single extremity supported Sitting balance-Leahy Scale: Fair Sitting balance - Comments: Patient requires assistance to find her sitting balance, but can maintain sitting balance with cues to sit tall and use of her UEs Postural control: Posterior lean   Standing balance-Leahy Scale: Poor Standing balance comment: Patient has little to no extensor strength and remains flexed at the spine significantly throughout ambulation with her walker. She fatigues quickly.                              Pertinent Vitals/Pain Pain Assessment: Faces Faces Pain Scale: Hurts little more Pain Location: Feels stiff in her legs  Pain Intervention(s): RN gave pain meds during session    Home Living Family/patient expects to be discharged to:: Skilled nursing facility Living Arrangements: Spouse/significant other  Patient has 4ww, electric wheelchair, lift chair, and manual wheelchair at home. Difficult to understand patient with speech, so it is not  clear if she has any other equipment.                   Prior Function Level of Independence: Needs assistance   Gait / Transfers Assistance Needed: Requires significant assistance for all mobility, she has had multiple falls in the past years.   ADL's / Homemaking Assistance Needed: Requires significant assistance for all  ADLs, unable to determine extent secondary to decreased speech.         Hand Dominance        Extremity/Trunk Assessment   Upper Extremity Assessment: Generalized weakness (Patient is able to produce good grip strength bilaterally and is able to reach back and touch her head symmetrically with her UEs)           Lower Extremity Assessment: Generalized weakness (She is able to produce 5/5 symmetric knee extension, 4/5 knee flexion and hip extension bilaterally)      Cervical / Trunk Assessment: Kyphotic  Communication   Communication: Expressive difficulties  Cognition Arousal/Alertness: Awake/alert Behavior During Therapy: WFL for tasks assessed/performed Overall Cognitive Status: Within Functional Limits for tasks assessed                      General Comments General comments (skin integrity, edema, etc.): Appears in tact in anterior LEs     Exercises        Assessment/Plan    PT Assessment Patient needs continued PT services  PT Diagnosis Difficulty walking;Abnormality of gait   PT Problem List Decreased strength;Decreased range of motion;Decreased safety awareness;Decreased activity tolerance;Decreased balance;Decreased mobility  PT Treatment Interventions DME instruction;Balance training;Gait training;Neuromuscular re-education;Therapeutic activities;Therapeutic exercise;Wheelchair mobility training   PT Goals (Current goals can be found in the Care Plan section) Acute Rehab PT Goals Patient Stated Goal: To be safe with ambulation  PT Goal Formulation: With patient Time For Goal Achievement: 01/15/15 Potential to Achieve Goals: Poor    Frequency Min 2X/week   Barriers to discharge   Severity of neurologic condition     Co-evaluation               End of Session Equipment Utilized During Treatment: Gait belt Activity Tolerance: Patient tolerated treatment well;Patient limited by fatigue Patient left: in chair;with call bell/phone within  reach;with nursing/sitter in room Nurse Communication: Mobility status         Time: 4782-9562 PT Time Calculation (min) (ACUTE ONLY): 33 min   Charges:   PT Evaluation $Initial PT Evaluation Tier I: 1 Procedure     PT G Codes:   PT G-Codes **NOT FOR INPATIENT CLASS** Functional Assessment Tool Used: Clinical judgement  Functional Limitation: Mobility: Walking and moving around Mobility: Walking and Moving Around Current Status (Z3086): At least 40 percent but less than 60 percent impaired, limited or restricted Mobility: Walking and Moving Around Goal Status [phone]): At least 40 percent but less than 60 percent impaired, limited or restricted   Kerin Ransom, PT, DPT    01/01/2015, 3:17 PM  Addendum added G-codes

## 2015-01-01 NOTE — Progress Notes (Signed)
Bladder scanned with 192 mL at 0815.

## 2015-01-01 NOTE — Progress Notes (Signed)
Dr. Elpidio AnisSudini notified at patient's request. Patient stating being uncomfortable with bladder feeling full and needing tessalon pearl. MD ordered tessalon pearl every 4 hours as needed and in/out cath if bladder scan is greater than 350 mL or at patient's request.

## 2015-01-01 NOTE — Evaluation (Signed)
Clinical/Bedside Swallow Evaluation Patient Details  Name: Jennifer [name] MRN: [id number] Date of Birth: 1961-02-27  Today's Date: 01/01/2015 Time: SLP Start Time (ACUTE ONLY): 1300 SLP Stop Time (ACUTE ONLY): 1400 SLP Time Calculation (min) (ACUTE ONLY): 60 min  Past Medical History:  Past Medical History  Diagnosis Date  . ALS (amyotrophic lateral sclerosis)   . Depression    Past Surgical History:  Past Surgical History  Procedure Laterality Date  . Cesarean section    . Nasal sinus surgery     HPI:  pt has a baseline dx of ALS and is followed by the ALS team at [facility] though some chart notes indicate no recent f/u visits since last August. Pt denied any problems at home swallowing but noted such immediately as she began the meal. Pt was able to feed herself w/ setup A. Pt's speech is quite Dysarthric w/ decreased intelligibility. Pt is able to repeat to make self understood at times, or types to text for her communication. Pt is cared for at home by Husband and Dtr. Pt denied having any "pneumonia before". She is alert and oriented x3; able to give biopersonal info. to SLP re: herself and health.    Assessment / Plan / Recommendation Clinical Impression  Pt appears to present w/ min.-mod. oropharyngeal phase dysphagia c/b slow oral motor movements overall, slow bolus manipulation, and increased mastication time/effort w/ soft solids. Pt demo. increased coughing w/ trials of thin liquids when using straws but demo. adequate toleration of trials of thin liquids when drinking via cup following general aspiration precautions. Suspect pt has a delayed swallow initiation which increases her risk for aspiration as well as the oral phase deficits; suspect this presentation is related to her baseline dx of ALS. Pt is at increased risk for aspiration therefore rec. a Dys. III w/ thin liquids - NO Straws. Rec. Meds in Puree for safer swallowing. Rec. nutritional supplements for support per Dietician  and MD.      Aspiration Risk  Mild (would be expected to increase in severity)    Diet Recommendation Dysphagia 3 (Mech soft);Thin   Medication Administration: Whole meds with puree (or crushed in Puree) Compensations: Slow rate;Small sips/bites;Check for pocketing    Other  Recommendations Recommended Consults:  (continue to f/u w/ her ALS Team at [facility]) Oral Care Recommendations: Oral care BID;Oral care before and after PO (assist w/ oral care)   Follow Up Recommendations       Frequency and Duration min 3x week  1 week   Pertinent Vitals/Pain denied    SLP Swallow Goals     Swallow Study Prior Functional Status   pt reported she lived at home w/ family; baseline slow eating/drinking w/ "some coughing sometimes".     General Date of Onset: 12/28/14 Other Pertinent Information: pt has a baseline dx of ALS and is followed by the ALS team at [facility] though some chart notes indicate no recent f/u visits since last August. Pt denied any problems at home swallowing but noted such immediately as she began the meal. Pt was able to feed herself w/ setup A. Pt's speech is quite Dysarthric w/ decreased intelligibility. Pt is able to repeat to make self understood at times, or types to text for her communication. Pt is cared for at home by Husband and Dtr. Pt denied having any "pneumonia before". She is alert and oriented x3; able to give biopersonal info. to SLP re: herself and health.  Type of Study: Bedside swallow evaluation Diet  Prior to this Study: Regular;Thin liquids Temperature Spikes Noted: No Respiratory Status: Room air History of Recent Intubation: No Behavior/Cognition: Alert;Cooperative (slow motor movements) Oral Cavity - Dentition: Adequate natural dentition/normal for age Self-Feeding Abilities: Able to feed self;Needs assist;Needs set up Patient Positioning: Upright in bed Baseline Vocal Quality: Low vocal intensity (speech is Dysarthric ) Volitional Cough:  Weak Volitional Swallow: Able to elicit (slow to complete)    Oral/Motor/Sensory Function Overall Oral Motor/Sensory Function: Impaired at baseline Labial Symmetry: Within Functional Limits Labial Strength: Reduced Lingual ROM: Within Functional Limits Lingual Symmetry: Within Functional Limits Lingual Strength: Within Functional Limits (grossly) Facial Symmetry: Within Functional Limits Mandible: Within Functional Limits   Ice Chips Ice chips: Not tested Other Comments: pt was eating some of her meal already   Thin Liquid Thin Liquid: Impaired Presentation: Self Fed;Cup;Straw (cup - ~4 ozs; straw - ~6 trials) Oral Phase Impairments: Reduced lingual movement/coordination;Reduced labial seal Oral Phase Functional Implications: Prolonged oral transit (slower mastication ) Pharyngeal  Phase Impairments: Cough - Immediate (when using straw 3/6 trials)    Nectar Thick Nectar Thick Liquid: Not tested   Honey Thick Honey Thick Liquid: Not tested   Puree Puree: Impaired Presentation: Spoon;Self Fed Oral Phase Impairments: Reduced labial seal;Reduced lingual movement/coordination;Impaired anterior to posterior transit Oral Phase Functional Implications: Prolonged oral transit Pharyngeal Phase Impairments:  (none)   Solid   GO Functional Assessment Tool Used:  (BSE) Functional Limitations: Swallowing Swallow Current Status ([id number]([id number]): At least 20 percent but less than 40 percent impaired, limited or restricted Swallow Goal Status [phone]([id number]): At least 20 percent but less than 40 percent impaired, limited or restricted Swallow Discharge Status [phone]): At least 20 percent but less than 40 percent impaired, limited or restricted  Solid: Impaired Presentation: Self Fed Oral Phase Impairments: Reduced lingual movement/coordination;Impaired anterior to posterior transit;Impaired mastication Pharyngeal Phase Impairments:  (none) Other Comments: increased oral phase time; slow oral motor movements  overall; slow bolus manipulation       Genie Wenke,Jama 01/01/2015,2:23 PM

## 2015-01-01 NOTE — Progress Notes (Signed)
[facility] [place] [facility]   PATIENT NAME: Jennifer [name]    MR#:  [id number]  DATE OF BIRTH:  November 14, 1960  SUBJECTIVE:  CHIEF COMPLAINT:   Chief Complaint  Patient presents with  . Urinary Retention   Tearful. Family at bedside. No concerns other than urinary retention. REVIEW OF SYSTEMS:    Review of Systems  Constitutional: Positive for malaise/fatigue. Negative for fever, chills and weight loss.  HENT: Negative for sore throat.   Eyes: Negative for blurred vision, double vision and pain.  Respiratory: Negative for cough, hemoptysis, shortness of breath and wheezing.   Cardiovascular: Negative for chest pain, palpitations, orthopnea and leg swelling.  Gastrointestinal: Negative for heartburn, nausea, vomiting, abdominal pain, diarrhea and constipation.  Genitourinary: Positive for dysuria. Negative for hematuria.  Musculoskeletal: Negative for back pain and joint pain.  Skin: Negative for rash.  Neurological: Negative for sensory change, speech change, focal weakness and headaches.  Endo/Heme/Allergies: Does not bruise/bleed easily.  Psychiatric/Behavioral: Positive for depression and suicidal ideas. The patient is not nervous/anxious.       DRUG ALLERGIES:   Allergies  Allergen Reactions  . Bee Venom Swelling  . Shellfish Allergy Anaphylaxis  . Lipitor [Atorvastatin] Other (See Comments)    Reaction:  Muscle discomfort   . Oxycodone Other (See Comments)    Reaction:  Muscle stiffness   . Prednisone Other (See Comments)    Reaction:  Psychosis   . Vicodin [Hydrocodone-Acetaminophen] Other (See Comments)    Reaction:  Muscle stiffness   . Oxycontin [Oxycodone Hcl] Other (See Comments)    Reaction:  Muscle stiffness   . Peanut-Containing Drug Products Diarrhea    VITALS:  Blood pressure 118/80, pulse 84, temperature 97.9 F (36.6 C), temperature source Oral, resp. rate 18, height  (1.499 m), weight 51.438 kg (113 lb 6.4  oz), SpO2 97 %.  PHYSICAL EXAMINATION:   Physical Exam  GENERAL:  54 y.o.-year-old patient lying in the bed tearful EYES: Pupils equal, round, reactive to light and accommodation. No scleral icterus. Extraocular muscles intact.  HEENT: Head atraumatic, normocephalic. Oropharynx and nasopharynx clear.  NECK:  Supple, no jugular venous distention. No thyroid enlargement, no tenderness.  LUNGS: Normal breath sounds bilaterally, no wheezing, rales, rhonchi. No use of accessory muscles of respiration.  CARDIOVASCULAR: S1, S2 normal. No murmurs, rubs, or gallops.  ABDOMEN: Soft, nontender, nondistended. Bowel sounds present. No organomegaly or mass.  EXTREMITIES: No cyanosis, clubbing or edema b/l.    NEUROLOGIC: Cranial nerves II through XII are intact.  PSYCHIATRIC: The patient is alert. SKIN: No obvious rash, lesion, or ulcer.    LABORATORY PANEL:   CBC  Recent Labs Lab 01/01/15 0516  WBC 7.0  HGB 12.6  HCT 37.1  PLT 192   ------------------------------------------------------------------------------------------------------------------  Chemistries   Recent Labs Lab 12/31/14 1659 01/01/15 0516  NA 145 141  K 3.0* 3.6  CL 109 111  CO2 25 23  GLUCOSE 102* 103*  BUN 18 15  CREATININE 0.86 0.75  CALCIUM 9.6 9.0  AST 17  --   ALT 11*  --   ALKPHOS 37*  --   BILITOT 0.6  --    ------------------------------------------------------------------------------------------------------------------  Cardiac Enzymes No results for input(s): TROPONINI in the last 168 hours. ------------------------------------------------------------------------------------------------------------------  RADIOLOGY:  Ct Head Wo Contrast  12/31/2014   CLINICAL DATA:  Unresponsive  EXAM: CT HEAD WITHOUT CONTRAST  TECHNIQUE: Contiguous axial images were obtained from the base of the skull through the vertex without intravenous  contrast.  COMPARISON:  None.  FINDINGS: There is no evidence of mass  effect, midline shift, or extra-axial fluid collections. There is no evidence of a space-occupying lesion or intracranial hemorrhage. There is no evidence of a cortical-based area of acute infarction. There is generalized cerebral atrophy. There is periventricular white matter low attenuation likely secondary to microangiopathy.  The ventricles and sulci are appropriate for the patient's age. The basal cisterns are patent.  Visualized portions of the orbits are unremarkable. The visualized portions of the paranasal sinuses and mastoid air cells are unremarkable.  The osseous structures are unremarkable.  IMPRESSION: 1. No acute intracranial pathology. 2. Chronic microvascular disease and cerebral atrophy.   Electronically Signed   By: Elige KoHetal  Patel   On: 12/31/2014 16:40     ASSESSMENT AND PLAN:   * Acute encephalopathy Due to medications. Resolved once her valium and neurontin have been stopped.  * Severe depression with suicidal ideation IVC in place. To be seen by Psychiatry. BHU?  * Chronic urinary retention In and Out cath PRN  * ALS F/U neuro as OP   All the records are reviewed and case discussed with Care Management/Social Workerr. Management plans discussed with the patient, family and they are in agreement.  CODE STATUS: FULL CODE  DVT Prophylaxis: SCDs  TOTAL TIME TAKING CARE OF THIS PATIENT: 30 minutes.   Stable for discharge home or BHU per psychiatry evaluation.   Milagros LollSudini, Danni Leabo R M.D on 01/01/2015 at 11:47 AM  Between 7am to 6pm - Pager - [phone]  After 6pm go to [email or web address] - password [email or web address] [facility]  [facility]  Office  [phone]  CC: Primary care physician; Ruthe Mannanalia Aron, MD
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
## 2015-01-01 NOTE — Progress Notes (Signed)
Patient had 135 ml of urine for her bladder scan.

## 2015-01-01 NOTE — Plan of Care (Signed)
Problem: SLP Dysphagia Goals Goal: Misc Dysphagia Goal Pt will safely tolerate po diet of least restrictive consistency w/ no overt s/s of aspiration noted by Staff/pt/family x3 sessions.    

## 2015-01-01 NOTE — Progress Notes (Signed)
Dr. Elpidio AnisSudini notified patient had been bladder scanned d/t c/o bladder pressure. Bladder scanned at 192 mL. MD stated may insert foley if bladder scanned at 500cc. MD to put in order for speech evaluation.

## 2015-01-01 NOTE — Progress Notes (Signed)
Bladder scanned per patient request. Bladder scanned at 253 mL at 1230.
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
## 2015-01-02 DIAGNOSIS — F333 Major depressive disorder, recurrent, severe with psychotic symptoms: Secondary | ICD-10-CM | POA: Diagnosis not present

## 2015-01-02 DIAGNOSIS — G1221 Amyotrophic lateral sclerosis: Secondary | ICD-10-CM | POA: Diagnosis not present

## 2015-01-02 LAB — LEVETIRACETAM LEVEL: LEVETIRACETAM: 65.5 ug/mL — AB (ref 10.0–40.0)

## 2015-01-02 NOTE — Progress Notes (Signed)
[facility] has been received. BCBS approved patient for 8 days at SNF starting 01/03/15 through 01/10/15. Clinical Social Worker (CSW) contacted patient's husband Dorinda HillDonald and made him aware of above. Plan is for patient to D/C to [facility] tomorrow 01/03/15. CSW will continue to follow and assist as needed.   Jetta LoutBailey Morgan, LCSWA [phone]

## 2015-01-02 NOTE — Progress Notes (Signed)
Clinical Social Worker (CSW) contacted patient's husband Cloyd StagersDonald [place]am[place] and presented bed offers. [facility] chose [facility]. CSW left a Engineer, technical salesvoicemail for [employer] and Loie at [facility] requesting them to start [employer] authorization. Patient's husband is prepared to take patient home in the event BCBS denies SNF stay. MD aware of above. CSW will continue to follow and assist as needed.   Jetta LoutBailey Morgan, LCSWA [phone]

## 2015-01-02 NOTE — Care Management (Signed)
[facility] can provide psych nursing but they are one week out for appointments. Per psych patient is not appropriate for inpatient psych. PT is recommending SNF. Patient/husband agreed yesterday.
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
## 2015-01-02 NOTE — Progress Notes (Signed)
[facility] Physicians - [place] at [facility]   PATIENT NAME: Jennifer [name]    MR#:  [id number]  DATE OF BIRTH:  1961-01-23  SUBJECTIVE:  CHIEF COMPLAINT:   Chief Complaint  Patient presents with  . Urinary Retention   Sitting up in bed. Sitter at bedside. No concerns  REVIEW OF SYSTEMS:    Review of Systems  Constitutional: Positive for malaise/fatigue. Negative for fever, chills and weight loss.  HENT: Negative for sore throat.   Eyes: Negative for blurred vision, double vision and pain.  Respiratory: Negative for cough, hemoptysis, shortness of breath and wheezing.   Cardiovascular: Negative for chest pain, palpitations, orthopnea and leg swelling.  Gastrointestinal: Negative for heartburn, nausea, vomiting, abdominal pain, diarrhea and constipation.  Genitourinary: Positive for dysuria. Negative for hematuria.  Musculoskeletal: Negative for back pain and joint pain.  Skin: Negative for rash.  Neurological: Negative for sensory change, speech change, focal weakness and headaches.  Endo/Heme/Allergies: Does not bruise/bleed easily.  Psychiatric/Behavioral: Positive for depression. Negative for suicidal ideas. The patient is not nervous/anxious.       DRUG ALLERGIES:   Allergies  Allergen Reactions  . Bee Venom Swelling  . Shellfish Allergy Anaphylaxis  . Lipitor [Atorvastatin] Other (See Comments)    Reaction:  Muscle discomfort   . Oxycodone Other (See Comments)    Reaction:  Muscle stiffness   . Prednisone Other (See Comments)    Reaction:  Psychosis   . Vicodin [Hydrocodone-Acetaminophen] Other (See Comments)    Reaction:  Muscle stiffness   . Oxycontin [Oxycodone Hcl] Other (See Comments)    Reaction:  Muscle stiffness   . Peanut-Containing Drug Products Diarrhea    VITALS:  Blood pressure 104/69, pulse 80, temperature 97.8 F (36.6 C), temperature source Oral, resp. rate 20, height  (1.499 m), weight 51.438 kg (113 lb 6.4 oz), SpO2  97 %.  PHYSICAL EXAMINATION:   Physical Exam  GENERAL:  54 y.o.-year-old patient lying in the bed tearful EYES: Pupils equal, round, reactive to light and accommodation. No scleral icterus. Extraocular muscles intact.  HEENT: Head atraumatic, normocephalic. Oropharynx and nasopharynx clear.  NECK:  Supple, no jugular venous distention. No thyroid enlargement, no tenderness.  LUNGS: Normal breath sounds bilaterally, no wheezing, rales, rhonchi. No use of accessory muscles of respiration.  CARDIOVASCULAR: S1, S2 normal. No murmurs, rubs, or gallops.  ABDOMEN: Soft, nontender, nondistended. Bowel sounds present. No organomegaly or mass.  EXTREMITIES: No cyanosis, clubbing or edema b/l.    NEUROLOGIC: Cranial nerves II through XII are intact.  PSYCHIATRIC: The patient is alert. SKIN: No obvious rash, lesion, or ulcer.    LABORATORY PANEL:   CBC  Recent Labs Lab 01/01/15 0516  WBC 7.0  HGB 12.6  HCT 37.1  PLT 192   ------------------------------------------------------------------------------------------------------------------  Chemistries   Recent Labs Lab 12/31/14 1659 01/01/15 0516  NA 145 141  K 3.0* 3.6  CL 109 111  CO2 25 23  GLUCOSE 102* 103*  BUN 18 15  CREATININE 0.86 0.75  CALCIUM 9.6 9.0  AST 17  --   ALT 11*  --   ALKPHOS 37*  --   BILITOT 0.6  --    ------------------------------------------------------------------------------------------------------------------  Cardiac Enzymes No results for input(s): TROPONINI in the last 168 hours. ------------------------------------------------------------------------------------------------------------------  RADIOLOGY:  Ct Head Wo Contrast  12/31/2014   CLINICAL DATA:  Unresponsive  EXAM: CT HEAD WITHOUT CONTRAST  TECHNIQUE: Contiguous axial images were obtained from the base of the skull through the vertex without  intravenous contrast.  COMPARISON:  None.  FINDINGS: There is no evidence of mass effect,  midline shift, or extra-axial fluid collections. There is no evidence of a space-occupying lesion or intracranial hemorrhage. There is no evidence of a cortical-based area of acute infarction. There is generalized cerebral atrophy. There is periventricular white matter low attenuation likely secondary to microangiopathy.  The ventricles and sulci are appropriate for the patient's age. The basal cisterns are patent.  Visualized portions of the orbits are unremarkable. The visualized portions of the paranasal sinuses and mastoid air cells are unremarkable.  The osseous structures are unremarkable.  IMPRESSION: 1. No acute intracranial pathology. 2. Chronic microvascular disease and cerebral atrophy.   Electronically Signed   By: Elige KoHetal  Patel   On: 12/31/2014 16:40     ASSESSMENT AND PLAN:   * Acute encephalopathy Due to medications. Resolved once her valium and neurontin have been stopped.  * Severe depression with suicidal ideation IVC in place.  Dr. Toni Amendlapacs on board Sitter at bedside.  * Chronic urinary retention In and Out cath PRN  * ALS F/U neuro as OP   All the records are reviewed and case discussed with Care Management/Social Workerr. Management plans discussed with the patient, family and they are in agreement.  CODE STATUS: FULL CODE  DVT Prophylaxis: SCDs  TOTAL TIME TAKING CARE OF THIS PATIENT: 30 minutes.   SNF at discharge    Milagros LollSudini, Alexis Reber R M.D on 01/02/2015 at 11:26 AM  Between 7am to 6pm - Pager - [phone]  After 6pm go to [email or web address] - password [email or web address] [facility]  [facility]  Office  [phone]  CC: Primary care physician; Ruthe Mannanalia Aron, MD

## 2015-01-02 NOTE — Progress Notes (Signed)
[facility] admissions coordinator at [facility] sent Clinical Social Worker (CSW) a message that he would start [employer] authorization. CSW will continue to follow and assist as needed.   Jetta LoutBailey Morgan, LCSWA [phone]

## 2015-01-03 ENCOUNTER — Observation Stay: Payer: BLUE CROSS/BLUE SHIELD

## 2015-01-03 DIAGNOSIS — F333 Major depressive disorder, recurrent, severe with psychotic symptoms: Secondary | ICD-10-CM | POA: Diagnosis not present

## 2015-01-03 DIAGNOSIS — G1221 Amyotrophic lateral sclerosis: Secondary | ICD-10-CM | POA: Diagnosis not present

## 2015-01-03 LAB — URINALYSIS COMPLETE WITH MICROSCOPIC (ARMC ONLY)
BILIRUBIN URINE: NEGATIVE
Bacteria, UA: NONE SEEN
Glucose, UA: NEGATIVE mg/dL
KETONES UR: NEGATIVE mg/dL
Nitrite: NEGATIVE
PROTEIN: 30 mg/dL — AB
Specific Gravity, Urine: 1.023 (ref 1.005–1.030)
pH: 5 (ref 5.0–8.0)

## 2015-01-03 MED ORDER — ARIPIPRAZOLE 5 MG PO TABS: 5.0000 mg | ORAL_TABLET | Freq: Every day | ORAL | Status: DC

## 2015-01-03 MED ORDER — CIPROFLOXACIN HCL 500 MG PO TABS
500.0000 mg | ORAL_TABLET | Freq: Two times a day (BID) | ORAL | Status: DC
  Administered 2015-01-03: 500 mg via ORAL
  Filled 2015-01-03: qty 1

## 2015-01-03 MED ORDER — BENZONATATE 100 MG PO CAPS: 100.0000 mg | ORAL_CAPSULE | Freq: Three times a day (TID) | ORAL | Status: DC | PRN

## 2015-01-03 MED ORDER — ALPRAZOLAM 0.25 MG PO TABS: 0.2500 mg | ORAL_TABLET | Freq: Three times a day (TID) | ORAL | Status: DC

## 2015-01-03 MED ORDER — CIPROFLOXACIN HCL 500 MG PO TABS: 500.0000 mg | ORAL_TABLET | Freq: Two times a day (BID) | ORAL | Status: DC

## 2015-01-03 NOTE — Progress Notes (Signed)
Patient is medially stable for D/C to [facility] today. Per [facility] admissions coordinator at [facility] patient is going to room 505. BCBS authorization has been received. RN will call report to 500 hall and arrange EMS for transport. Clinical Child psychotherapist (CSW) prepared D/C packet and sent D/C Summary to [facility] via carefinder. Patient's husband Dorinda Hill and daughter Gerarda Gunther were at bedside and aware of above. Please reconsult if future social work needs arise. CSW signing off.   Jetta Lout, LCSWA [phone]

## 2015-01-03 NOTE — Discharge Summary (Addendum)
[facility] Physicians - [place] at [facility]   PATIENT NAME: Jennifer [name]    MR#:  [id number]  DATE OF BIRTH:  1960-11-24  DATE OF ADMISSION:  12/28/2014 ADMITTING PHYSICIAN: Shaune Pollack, MD  DATE OF DISCHARGE: No discharge date for patient encounter.  PRIMARY CARE PHYSICIAN: Ruthe Mannan, MD    ADMISSION DIAGNOSIS:  Urinary retention [R33.9] Depression [F32.9] Altered mental status, unspecified altered mental status type [R41.82] Syncope, unspecified syncope type [R55]  DISCHARGE DIAGNOSIS:  Principal Problem:   Recurrent major depression-severe Active Problems:   ALS (amyotrophic lateral sclerosis)   Unresponsive episode   SECONDARY DIAGNOSIS:   Past Medical History  Diagnosis Date  . ALS (amyotrophic lateral sclerosis)   . Depression      ADMITTING HISTORY Jennifer [name] is a 54 y.o. female with a known history of ALS and GERD. The patient was brought to the ED due to urinary retention. In addition patient has a suicidal ideations and was placed on IVC. She has been seen by psychiatry, para care and the social worker in ED for the past few days. The final dissection was discharged to home with home health and catheter in and out. But the patient was found disc conjugate gaze and unresponsive for 15 minutes per Dr. Glenetta Hew. Patient now is alert awake but is still confused. Oriented to patient's husband and daughter, this is a not the patient's baseline. They said the patient has some moles slurred speech. We're very concerned that the patient was started on Abilify and the Neurontin the ED, which may set dated the patient. Since patient has 1 episode of unresponsiveness, Dr. Glenetta Hew suggest that admitted patient for observation.   HOSPITAL COURSE:    * UTI Cx pending. Treat with ciprofloxacin for 5 days. 1st dose given in hospital. Afebrile. CT abdomen/pelvis WO contrast normal  * Acute encephalopathy Due to medications. Resolved once her valium  and neurontin have been stopped.  * Severe depression with suicidal ideation IVC DISCONTINUED. Dr. Toni Amend on board.  * Chronic urinary retention In and Out cath PRN.  * ALS F/U neuro as OP.    CONSULTS OBTAINED:  Treatment Team:  Audery Amel, MD  DRUG ALLERGIES:   Allergies  Allergen Reactions  . Bee Venom Swelling  . Shellfish Allergy Anaphylaxis  . Lipitor [Atorvastatin] Other (See Comments)    Reaction:  Muscle discomfort   . Oxycodone Other (See Comments)    Reaction:  Muscle stiffness   . Prednisone Other (See Comments)    Reaction:  Psychosis   . Vicodin [Hydrocodone-Acetaminophen] Other (See Comments)    Reaction:  Muscle stiffness   . Oxycontin [Oxycodone Hcl] Other (See Comments)    Reaction:  Muscle stiffness   . Peanut-Containing Drug Products Diarrhea    DISCHARGE MEDICATIONS:   Current Discharge Medication List    START taking these medications   Details  ARIPiprazole (ABILIFY) 5 MG tablet Take 1 tablet (5 mg total) by mouth daily. Qty: 30 tablet, Refills: 0    !! benzonatate (TESSALON) 100 MG capsule Take 1 capsule (100 mg total) by mouth 3 (three) times daily as needed for cough. Qty: 20 capsule, Refills: 0    ciprofloxacin (CIPRO) 500 MG tablet Take 1 tablet (500 mg total) by mouth 2 (two) times daily. Qty: 10 tablet, Refills: 0     !! - Potential duplicate medications found. Please discuss with provider.    CONTINUE these medications which have NOT CHANGED   Details  !! benzonatate (TESSALON)  100 MG capsule Take 100 mg by mouth 3 (three) times daily as needed for cough.    !! buPROPion (WELLBUTRIN SR) 150 MG 12 hr tablet Take 150 mg by mouth 4 (four) times daily.     cholecalciferol (VITAMIN D) 1000 UNITS tablet Take 1,000 Units by mouth daily.    clindamycin (CLEOCIN) 300 MG capsule Take 300 mg by mouth daily.     Coenzyme Q10 (COQ10 PO) Take 1 capsule by mouth daily.    Dextromethorphan-Quinidine (NUEDEXTA) 20-10 MG CAPS Take 1  capsule by mouth 2 (two) times daily.     diazepam (VALIUM) 10 MG tablet Take 10 mg by mouth every 6 (six) hours as needed for anxiety.    fluticasone (FLONASE) 50 MCG/ACT nasal spray USE 2 SPRAYS IN EACH NOSTRIL EVERY DAY AS DIRECTED Qty: 16 g, Refills: 5    ibuprofen (ADVIL,MOTRIN) 200 MG tablet Take 800 mg by mouth 3 (three) times daily as needed for mild pain.     lactulose (CHRONULAC) 10 GM/15ML solution Take 10 g by mouth daily as needed for mild constipation.     levETIRAcetam (KEPPRA) 500 MG tablet Take 500 mg by mouth 2 (two) times daily.     loratadine (CLARITIN) 10 MG tablet Take 10 mg by mouth daily as needed for allergies.     omeprazole (PRILOSEC) 40 MG capsule Take 40 mg by mouth daily.     ranitidine (ZANTAC) 150 MG tablet Take 150 mg by mouth daily as needed for heartburn.    tizanidine (ZANAFLEX) 2 MG capsule Take 2 mg by mouth at bedtime as needed for muscle spasms.     !! buPROPion (WELLBUTRIN SR) 150 MG 12 hr tablet TAKE 1 TABLET (150 MG TOTAL) BY MOUTH 4 (FOUR) TIMES DAILY. Qty: 120 tablet, Refills: 1     !! - Potential duplicate medications found. Please discuss with provider.    STOP taking these medications     ranitidine (ZANTAC) 150 MG capsule      sertraline (ZOLOFT) 50 MG tablet      ALPRAZolam (XANAX) 0.25 MG tablet            VITAL SIGNS:  Blood pressure 115/59, pulse 89, temperature 98.4 F (36.9 C), temperature source Oral, resp. rate 16, height  (1.499 m), weight 51.438 kg (113 lb 6.4 oz), SpO2 100 %.  I/O:   Intake/Output Summary (Last 24 hours) at 01/03/15 0941 Last data filed at 01/03/15 0800  Gross per 24 hour  Intake    480 ml  Output   1050 ml  Net   -570 ml    PHYSICAL EXAMINATION:  Physical Exam  GENERAL:  54 y.o.-year-old patient lying in the bed with no acute distress.  LUNGS: Normal breath sounds bilaterally, no wheezing, rales,rhonchi or crepitation. No use of accessory muscles of respiration.   CARDIOVASCULAR: S1, S2 normal. No murmurs, rubs, or gallops.  ABDOMEN: Soft, non-tender, non-distended. Bowel sounds present. No organomegaly or mass.  NEUROLOGIC: Moves all 4 extremities. PSYCHIATRIC: The patient is alert  SKIN: No obvious rash, lesion, or ulcer.  Flat affect. Tearful at times  DATA REVIEW:   CBC  Recent Labs Lab 01/01/15 0516  WBC 7.0  HGB 12.6  HCT 37.1  PLT 192    Chemistries   Recent Labs Lab 12/31/14 1659 01/01/15 0516  NA 145 141  K 3.0* 3.6  CL 109 111  CO2 25 23  GLUCOSE 102* 103*  BUN 18 15  CREATININE 0.86 0.75  CALCIUM  9.6 9.0  AST 17  --   ALT 11*  --   ALKPHOS 37*  --   BILITOT 0.6  --     Cardiac Enzymes No results for input(s): TROPONINI in the last 168 hours.  Microbiology Results  Results for orders placed or performed in visit on 08/21/13  Urine culture     Status: None   Collection Time: 08/21/13  4:51 PM  Result Value Ref Range Status   Colony Count NO GROWTH  Final   Organism ID, Bacteria NO GROWTH  Final    RADIOLOGY:  Ct Abdomen Pelvis Wo Contrast  01/03/2015   CLINICAL DATA:  Urinary retention.  Urinary tract infection.  EXAM: CT ABDOMEN AND PELVIS WITHOUT CONTRAST  TECHNIQUE: Multidetector CT imaging of the abdomen and pelvis was performed following the standard protocol without IV contrast.  COMPARISON:  None.  FINDINGS: Musculoskeletal:  No aggressive osseous lesions.  Lung Bases: Atelectasis.  Liver: Unenhanced CT was performed per clinician order. Lack of IV contrast limits sensitivity and specificity, especially for evaluation of abdominal/pelvic solid viscera. Grossly normal.  Spleen:  Normal.  Gallbladder:  Normal.  Common bile duct:  Normal.  Pancreas:  Normal.  Adrenal glands:  Normal bilaterally.  Kidneys: Partially duplicated LEFT renal collecting system with prominent column a per 10. Tiny E LEFT upper pole area increased density probably representing milk of calcium in a cyst. No ureteral calculi. No  hydronephrosis or perinephric inflammatory stranding.  Stomach:  Distended with oral contrast.  Small bowel:  Normal.  Colon: Large stool burden. Normal appendix. No obstruction or inflammatory changes of colon.  Pelvic Genitourinary: Distended urinary bladder. No inflammatory changes, debris, gas or mural thickening. Uterus and adnexa appear within normal limits.  Peritoneum: No free air or free fluid.  Vascular/lymphatic: Atherosclerosis without an acute vascular abnormality allowing for noncontrast technique.  Body Wall: RIGHT lower quadrant anterior abdominal wall subcutaneous injection site. Nonspecific stranding lateral to the LEFT hip in the subcutaneous fat.  IMPRESSION: 1. No acute abnormality. 2. Atherosclerosis.   Electronically Signed   By: Andreas Newport M.D.   On: 01/03/2015 14:46      Follow up with PCP AND PSYCHIATRY in 1 week.  Management plans discussed with the patient, family and they are in agreement.  CODE STATUS:     Code Status Orders        Start     Ordered   12/31/14 2317  Full code   Continuous     12/31/14 2317      TOTAL TIME TAKING CARE OF THIS PATIENT ON DAY OF DISCHARGE: more than 30  minutes.    Jennifer [name] M.D on 01/03/2015 at 9:41 AM  Between 7am to 6pm - Pager - [phone]  After 6pm go to [email or web address] - password [email or web address] [facility]  [facility]  Office  [phone]  CC: Primary care physician; Ruthe Mannan, MD

## 2015-01-03 NOTE — Discharge Instructions (Signed)
°  DIET:  Regular diet  DISCHARGE CONDITION:  Stable  ACTIVITY:  Activity as tolerated  OXYGEN:  Home Oxygen: No.   Oxygen Delivery: room air  DISCHARGE LOCATION:  nursing home   If you experience worsening of your admission symptoms, develop shortness of breath, life threatening emergency, suicidal or homicidal thoughts you must seek medical attention immediately by calling 911 or calling your MD immediately  if symptoms less severe.  You Must read complete instructions/literature along with all the possible adverse reactions/side effects for all the Medicines you take and that have been prescribed to you. Take any new Medicines after you have completely understood and accpet all the possible adverse reactions/side effects.   Please note  You were cared for by a hospitalist during your hospital stay. If you have any questions about your discharge medications or the care you received while you were in the hospital after you are discharged, you can call the unit and asked to speak with the hospitalist on call if the hospitalist that took care of you is not available. Once you are discharged, your primary care physician will handle any further medical issues. Please note that NO REFILLS for any discharge medications will be authorized once you are discharged, as it is imperative that you return to your primary care physician (or establish a relationship with a primary care physician if you do not have one) for your aftercare needs so that they can reassess your need for medications and monitor your lab values.   IN AND OUT CATH PRN FOR URINARY RETENTION  PSYCHIATRY FOLLOW UP IN 1-2 WEEKS

## 2015-01-03 NOTE — Progress Notes (Signed)
Speech Language Pathology Treatment: Dysphagia  Patient Details Name: Jennifer [name] MRN: [id number] DOB: [birth year] Today's Date: 01/03/2015 Time: 1100-1200 SLP Time Calculation (min) (ACUTE ONLY): 60 min  Assessment / Plan / Recommendation Clinical Impression  Pt appears to be tolerating her current diet w/ thin liquids via cup following strict aspiration precautions w/ support during meals for tray and food setup. Pt is able to feed self but requires moderate support in order to not fatigue. Husband and Dtr. Present; thoroughly discussed aspiration precautions including NO STRAWS when drinking as pt demo. Increased coughing w/ use of such. Discussed food options and food preparation in order to limit foods that increased demand of effort and are more difficult to masticate and clear w/in the Esophagus; rec. Extra condiments to moisten all foods. Rec. Continued strict aspiration precautions w/ rest breaks during meals to avoid fatigue which will lessen risk for aspiration. Rec. Continued use of nutritional drink supplements and f/u w/ Dietician for support and education. Rec. Meds in Puree - crushed as able for easier swallowing. Pt/Husband agreed. Pt also had a few questions re: her speech/volume during conversation; gave few recs. for conserving energy and making herself more intelligible by increasing her articulation. **Strongly rec. F/u w/ the ALS Team at [facility] for continued monitoring of her status and further education for pt and family re: the disease process.    HPI Other Pertinent Information: pt has a baseline dx of ALS and has been followed by the ALS team at [facility] though some chart notes indicate no recent f/u visits since last August. Pt has been able to feed herself w/ setup A. Pt's speech is quite Dysarthric w/ decreased intelligibility. Pt is able to repeat to make self understood at times, or types to text for her communication. She is alert and oriented x3. Pt has been tolerating her  current mech soft diet following strict aspiration precautions.    Pertinent Vitals Pain Assessment: No/denies pain  SLP Plan  Continue with current plan of care    Recommendations Diet recommendations: Dysphagia 3 (mechanical soft);Thin liquid Liquids provided via: Cup;No straw Medication Administration: Crushed with puree Supervision: Patient able to self feed;Full supervision/cueing for compensatory strategies Compensations: Slow rate;Small sips/bites;Check for pocketing Postural Changes and/or Swallow Maneuvers: Seated upright 90 degrees              General recommendations:  (SNF; f/u w/ SLA Team at [facility]) Oral Care Recommendations: Oral care BID;Oral care before and after PO;Staff/trained caregiver to provide oral care Follow up Recommendations: Skilled Nursing facility (ALS Team at [facility]) Plan: Continue with current plan of care    GO     Khrystal Jeanmarie,Annamay 01/03/2015, 11:56 AM

## 2015-01-03 NOTE — Progress Notes (Signed)
Pt continued to have periods of anger. Dr claypex in to evaluate patient family and MD spoke at length. Ct scan of pelvis is normal cleared for discharged meds walker and jewelry given to family.

## 2015-01-03 NOTE — Clinical Social Work Placement (Signed)
   CLINICAL SOCIAL WORK PLACEMENT  NOTE  Date:  01/03/2015  Patient Details  Name: Jennifer [name] MRN: [id number] Date of Birth: 1960/08/03  Clinical Social Work is seeking post-discharge placement for this patient at the Skilled  Nursing Facility level of care (*CSW will initial, date and re-position this form in  chart as items are completed):  Yes   Patient/family provided with [facility] Clinical Social Work Department's list of facilities offering this level of care within the geographic area requested by the patient (or if unable, by the patient's family).  Yes   Patient/family informed of their freedom to choose among providers that offer the needed level of care, that participate in Medicare, Medicaid or managed care program needed by the patient, have an available bed and are willing to accept the patient.  Yes   Patient/family informed of [facility]'s ownership interest in [facility] and [facility], as well as of the fact that they are under no obligation to receive care at these facilities.  PASRR submitted to EDS on 01/01/15     PASRR number received on 01/01/15     Existing PASRR number confirmed on       FL2 transmitted to all facilities in geographic area requested by pt/family on 01/01/15     FL2 transmitted to all facilities within larger geographic area on       Patient informed that his/her managed care company has contracts with or will negotiate with certain facilities, including the following:        Yes   Patient/family informed of bed offers received.  Patient chooses bed at  [facility] )     Physician recommends and patient chooses bed at      Patient to be transferred to  [facility] ) on 01/03/15.  Patient to be transferred to facility by  [facility] )     Patient family notified on 01/03/15 of transfer.  Name of family member notified:   (Patient's husband Dorinda Hill and daughter Gerarda Gunther were at bedside. )      PHYSICIAN       Additional Comment:    _______________________________________________ Haig Prophet, LCSW 01/03/2015, 3:57 PM
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
## 2015-01-03 NOTE — Progress Notes (Signed)
Family (daughter and spouse) visit at beginning of shift, concerned that patient may harm herself with IVC and sitter discontinued, states, "she is having one of her angry spells." [place] is upset and demanding family to "get out now, I would rather be in hell." States she is upset that family was 15 minutes late to visit and "it is all a joke to them, they try to act all innocent but they don't care." Family states that she had "one of these spells earlier today and when they returned later she was laughing about it that she was mean to them earlier." Tarissa also demanding to staff to in and out cath her, bladder scanned <300. Nursing instructed on importance of letting bladder fill more and risks of frequent catheterization. Emotional support given to family and to patient. Family went home, Inessa calmed down. PRN sleep and anxiety medication given at bedtime, slept well through the night. Denies pain. Nursing continues to monitor and assist with ADLs.

## 2015-01-04 ENCOUNTER — Encounter: Payer: Self-pay | Admitting: Emergency Medicine

## 2015-01-04 ENCOUNTER — Inpatient Hospital Stay
Admission: EM | Admit: 2015-01-04 | Discharge: 2015-01-06 | DRG: 690 | Disposition: A | Discharge status: Home / Self Care | Payer: BLUE CROSS/BLUE SHIELD | Attending: Internal Medicine | Admitting: Internal Medicine

## 2015-01-04 DIAGNOSIS — E785 Hyperlipidemia, unspecified: Secondary | ICD-10-CM | POA: Diagnosis present

## 2015-01-04 DIAGNOSIS — Z818 Family history of other mental and behavioral disorders: Secondary | ICD-10-CM

## 2015-01-04 DIAGNOSIS — N3000 Acute cystitis without hematuria: Secondary | ICD-10-CM | POA: Diagnosis present

## 2015-01-04 DIAGNOSIS — M549 Dorsalgia, unspecified: Secondary | ICD-10-CM | POA: Diagnosis present

## 2015-01-04 DIAGNOSIS — Z8051 Family history of malignant neoplasm of kidney: Secondary | ICD-10-CM

## 2015-01-04 DIAGNOSIS — Z825 Family history of asthma and other chronic lower respiratory diseases: Secondary | ICD-10-CM | POA: Diagnosis not present

## 2015-01-04 DIAGNOSIS — F339 Major depressive disorder, recurrent, unspecified: Secondary | ICD-10-CM | POA: Diagnosis present

## 2015-01-04 DIAGNOSIS — Z888 Allergy status to other drugs, medicaments and biological substances status: Secondary | ICD-10-CM

## 2015-01-04 DIAGNOSIS — G1221 Amyotrophic lateral sclerosis: Secondary | ICD-10-CM | POA: Diagnosis present

## 2015-01-04 DIAGNOSIS — R410 Disorientation, unspecified: Secondary | ICD-10-CM | POA: Diagnosis present

## 2015-01-04 DIAGNOSIS — Z87891 Personal history of nicotine dependence: Secondary | ICD-10-CM

## 2015-01-04 DIAGNOSIS — N390 Urinary tract infection, site not specified: Secondary | ICD-10-CM

## 2015-01-04 DIAGNOSIS — Z87440 Personal history of urinary (tract) infections: Secondary | ICD-10-CM

## 2015-01-04 DIAGNOSIS — G8929 Other chronic pain: Secondary | ICD-10-CM | POA: Diagnosis present

## 2015-01-04 DIAGNOSIS — F333 Major depressive disorder, recurrent, severe with psychotic symptoms: Secondary | ICD-10-CM

## 2015-01-04 DIAGNOSIS — Z79899 Other long term (current) drug therapy: Secondary | ICD-10-CM | POA: Diagnosis not present

## 2015-01-04 DIAGNOSIS — Z885 Allergy status to narcotic agent status: Secondary | ICD-10-CM

## 2015-01-04 DIAGNOSIS — Z8249 Family history of ischemic heart disease and other diseases of the circulatory system: Secondary | ICD-10-CM | POA: Diagnosis not present

## 2015-01-04 LAB — CBC WITH DIFFERENTIAL/PLATELET
BASOS ABS: 0 10*3/uL (ref 0–0.1)
Basophils Relative: 1 %
Eosinophils Absolute: 0.2 10*3/uL (ref 0–0.7)
Eosinophils Relative: 3 %
HEMATOCRIT: 38.2 % (ref 35.0–47.0)
Hemoglobin: 13.2 g/dL (ref 12.0–16.0)
Lymphocytes Relative: 29 %
Lymphs Abs: 2 10*3/uL (ref 1.0–3.6)
MCH: 33 pg (ref 26.0–34.0)
MCHC: 34.6 g/dL (ref 32.0–36.0)
MCV: 95.4 fL (ref 80.0–100.0)
MONO ABS: 0.4 10*3/uL (ref 0.2–0.9)
Monocytes Relative: 5 %
NEUTROS ABS: 4.3 10*3/uL (ref 1.4–6.5)
NEUTROS PCT: 62 %
Platelets: 185 10*3/uL (ref 150–440)
RBC: 4.01 MIL/uL (ref 3.80–5.20)
RDW: 13.1 % (ref 11.5–14.5)
WBC: 6.9 10*3/uL (ref 3.6–11.0)

## 2015-01-04 LAB — COMPREHENSIVE METABOLIC PANEL
ALBUMIN: 4.3 g/dL (ref 3.5–5.0)
ALT: 20 U/L (ref 14–54)
ANION GAP: 6 (ref 5–15)
AST: 20 U/L (ref 15–41)
Alkaline Phosphatase: 43 U/L (ref 38–126)
BUN: 15 mg/dL (ref 6–20)
CHLORIDE: 108 mmol/L (ref 101–111)
CO2: 27 mmol/L (ref 22–32)
Calcium: 9.2 mg/dL (ref 8.9–10.3)
Creatinine, Ser: 0.67 mg/dL (ref 0.44–1.00)
GFR calc Af Amer: >60 mL/min (ref >60)
Glucose, Bld: 86 mg/dL (ref 65–99)
Potassium: 4.1 mmol/L (ref 3.5–5.1)
Sodium: 141 mmol/L (ref 135–145)
Total Bilirubin: 0.5 mg/dL (ref 0.3–1.2)
Total Protein: 7.1 g/dL (ref 6.5–8.1)

## 2015-01-04 LAB — URINE DRUG SCREEN, QUALITATIVE (ARMC ONLY)
Amphetamines, Ur Screen: NOT DETECTED
Barbiturates, Ur Screen: NOT DETECTED
Benzodiazepine, Ur Scrn: POSITIVE — AB
COCAINE METABOLITE, UR ~~LOC~~: NOT DETECTED
Cannabinoid 50 Ng, Ur ~~LOC~~: POSITIVE — AB
MDMA (ECSTASY) UR SCREEN: NOT DETECTED
Methadone Scn, Ur: NOT DETECTED
Opiate, Ur Screen: NOT DETECTED
PHENCYCLIDINE (PCP) UR S: NOT DETECTED
TRICYCLIC, UR SCREEN: NOT DETECTED

## 2015-01-04 LAB — URINALYSIS COMPLETE WITH MICROSCOPIC (ARMC ONLY)
Bilirubin Urine: NEGATIVE
Glucose, UA: NEGATIVE mg/dL
HGB URINE DIPSTICK: NEGATIVE
Ketones, ur: NEGATIVE mg/dL
NITRITE: NEGATIVE
Protein, ur: 30 mg/dL — AB
SPECIFIC GRAVITY, URINE: 1.025 (ref 1.005–1.030)
pH: 5 (ref 5.0–8.0)

## 2015-01-04 LAB — ETHANOL

## 2015-01-04 MED ORDER — PANTOPRAZOLE SODIUM 40 MG PO TBEC
40.0000 mg | DELAYED_RELEASE_TABLET | Freq: Every day | ORAL | Status: DC
  Administered 2015-01-05 – 2015-01-06 (×2): 40 mg via ORAL
  Filled 2015-01-04 (×2): qty 1

## 2015-01-04 MED ORDER — SODIUM CHLORIDE 0.9 % IV SOLN
1.0000 g | INTRAVENOUS | Status: DC
  Administered 2015-01-04 – 2015-01-05 (×2): 1 g via INTRAVENOUS
  Filled 2015-01-04 (×3): qty 1

## 2015-01-04 MED ORDER — SODIUM CHLORIDE 0.9 % IV BOLUS (SEPSIS)
250.0000 mL | Freq: Once | INTRAVENOUS | Status: AC
  Administered 2015-01-04: 250 mL via INTRAVENOUS

## 2015-01-04 MED ORDER — ENOXAPARIN SODIUM 40 MG/0.4ML ~~LOC~~ SOLN
40.0000 mg | SUBCUTANEOUS | Status: DC
  Administered 2015-01-04: 40 mg via SUBCUTANEOUS
  Filled 2015-01-04: qty 0.4

## 2015-01-04 MED ORDER — ONDANSETRON HCL 4 MG PO TABS: 4.0000 mg | ORAL_TABLET | Freq: Four times a day (QID) | ORAL | Status: DC | PRN

## 2015-01-04 MED ORDER — FAMOTIDINE 20 MG PO TABS
10.0000 mg | ORAL_TABLET | Freq: Every day | ORAL | Status: DC
  Administered 2015-01-05 – 2015-01-06 (×2): 10 mg via ORAL
  Filled 2015-01-04: qty 2
  Filled 2015-01-04: qty 1

## 2015-01-04 MED ORDER — LACTULOSE 10 GM/15ML PO SOLN: 10.0000 g | Freq: Every day | ORAL | Status: DC | PRN

## 2015-01-04 MED ORDER — LORATADINE 10 MG PO TABS: 10.0000 mg | ORAL_TABLET | Freq: Every day | ORAL | Status: DC | PRN

## 2015-01-04 MED ORDER — SENNOSIDES-DOCUSATE SODIUM 8.6-50 MG PO TABS: 1.0000 | ORAL_TABLET | Freq: Every evening | ORAL | Status: DC | PRN

## 2015-01-04 MED ORDER — BUPROPION HCL ER (SR) 150 MG PO TB12: 150.0000 mg | ORAL_TABLET | Freq: Four times a day (QID) | ORAL | Status: DC

## 2015-01-04 MED ORDER — TIZANIDINE HCL 4 MG PO TABS
2.0000 mg | ORAL_TABLET | Freq: Every evening | ORAL | Status: DC | PRN
  Administered 2015-01-04 – 2015-01-05 (×2): 2 mg via ORAL
  Filled 2015-01-04 (×2): qty 1

## 2015-01-04 MED ORDER — VITAMIN D 1000 UNITS PO TABS
1000.0000 [IU] | ORAL_TABLET | Freq: Every day | ORAL | Status: DC
  Administered 2015-01-05 – 2015-01-06 (×2): 1000 [IU] via ORAL
  Filled 2015-01-04 (×2): qty 1

## 2015-01-04 MED ORDER — DEXTROMETHORPHAN-QUINIDINE 20-10 MG PO CAPS
1.0000 | ORAL_CAPSULE | Freq: Two times a day (BID) | ORAL | Status: DC
  Administered 2015-01-04: 1 via ORAL
  Administered 2015-01-05: 21:00:00 via ORAL
  Administered 2015-01-06: 1 via ORAL
  Filled 2015-01-04: qty 1

## 2015-01-04 MED ORDER — DIAZEPAM 5 MG PO TABS
10.0000 mg | ORAL_TABLET | Freq: Four times a day (QID) | ORAL | Status: DC | PRN
  Administered 2015-01-05 – 2015-01-06 (×3): 10 mg via ORAL
  Filled 2015-01-04 (×3): qty 2

## 2015-01-04 MED ORDER — ONDANSETRON HCL 4 MG/2ML IJ SOLN: 4.0000 mg | Freq: Four times a day (QID) | INTRAMUSCULAR | Status: DC | PRN

## 2015-01-04 MED ORDER — BENZONATATE 100 MG PO CAPS: 100.0000 mg | ORAL_CAPSULE | Freq: Three times a day (TID) | ORAL | Status: DC | PRN

## 2015-01-04 MED ORDER — FLUTICASONE PROPIONATE 50 MCG/ACT NA SUSP
2.0000 | Freq: Two times a day (BID) | NASAL | Status: DC | PRN
  Filled 2015-01-04: qty 16

## 2015-01-04 MED ORDER — COQ10 30 MG PO CAPS: ORAL_CAPSULE | Freq: Every day | ORAL | Status: DC

## 2015-01-04 MED ORDER — DIVALPROEX SODIUM 250 MG PO DR TAB
250.0000 mg | DELAYED_RELEASE_TABLET | Freq: Two times a day (BID) | ORAL | Status: DC
  Administered 2015-01-04 – 2015-01-06 (×4): 250 mg via ORAL
  Filled 2015-01-04 (×4): qty 1

## 2015-01-04 MED ORDER — LEVETIRACETAM 500 MG PO TABS
500.0000 mg | ORAL_TABLET | Freq: Two times a day (BID) | ORAL | Status: DC
  Administered 2015-01-04 – 2015-01-06 (×4): 500 mg via ORAL
  Filled 2015-01-04 (×4): qty 1

## 2015-01-04 MED ORDER — IBUPROFEN 800 MG PO TABS
800.0000 mg | ORAL_TABLET | Freq: Three times a day (TID) | ORAL | Status: DC | PRN
  Administered 2015-01-05 – 2015-01-06 (×2): 800 mg via ORAL
  Filled 2015-01-04 (×3): qty 1

## 2015-01-04 MED ORDER — ARIPIPRAZOLE 10 MG PO TABS
5.0000 mg | ORAL_TABLET | Freq: Every day | ORAL | Status: DC
  Administered 2015-01-05 – 2015-01-06 (×2): 5 mg via ORAL
  Filled 2015-01-04 (×2): qty 1

## 2015-01-04 MED ORDER — CEFTRIAXONE SODIUM IN DEXTROSE 20 MG/ML IV SOLN
1.0000 g | INTRAVENOUS | Status: AC
  Administered 2015-01-04: 1 g via INTRAVENOUS
  Filled 2015-01-04: qty 50
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
## 2015-01-04 NOTE — ED Notes (Signed)
Sitter ED tech Pam at bedside.
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
## 2015-01-04 NOTE — ED Notes (Signed)
Pharmacy called

## 2015-01-04 NOTE — ED Notes (Signed)
BEHAVIORAL HEALTH ROUNDING Patient sleeping: No. Patient alert and oriented: yes Behavior appropriate: Yes.  ; If no, describe:  Nutrition and fluids offered: Yes  Toileting and hygiene offered: Yes  Sitter present: yes Jennifer [name], NT Law enforcement present: Yes  and ODS
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
## 2015-01-04 NOTE — ED Notes (Signed)
ENVIRONMENTAL ASSESSMENT Potentially harmful objects out of patient reach: Yes.   Personal belongings secured: No. Patient dressed in hospital provided attire only: Yes.   Plastic bags out of patient reach: Yes.   Patient care equipment (cords, cables, call bells, lines, and drains) shortened, removed, or accounted for: Yes.   Equipment and supplies removed from bottom of stretcher: Yes.   Potentially toxic materials out of patient reach: Yes.   Sharps container removed or out of patient reach: Yes.   

## 2015-01-04 NOTE — ED Notes (Signed)
BIB [facility] from [facility] d/t self injury, biting tongue and her arms. Pt has history of depression and ALS. Per EMS, she has not injured herself in their care, but was cursing and crying. Pt visibly upset at this time.

## 2015-01-04 NOTE — ED Notes (Signed)
BEHAVIORAL HEALTH ROUNDING Patient sleeping: No. Patient alert and oriented: yes Behavior appropriate: Yes.  ; If no, describe:   Nutrition and fluids offered: Yes  Toileting and hygiene offered: Yes  Sitter present: yes BONITA NT Law enforcement present: Yes  and ODS

## 2015-01-04 NOTE — ED Notes (Addendum)
BEHAVIORAL HEALTH ROUNDING Patient sleeping: Yes.   Patient alert and oriented: not applicable Behavior appropriate: Yes.  ; If no, describe:   Nutrition and fluids offered: No Toileting and hygiene offered: No Sitter present: Joya Gaskins, NT Law enforcement present: Yes  and ODS  ENVIRONMENTAL ASSESSMENT Potentially harmful objects out of patient reach: Yes.   Personal belongings secured: Yes.   Patient dressed in hospital provided attire only: Yes.   Plastic bags out of patient reach: Yes.   Patient care equipment (cords, cables, call bells, lines, and drains) shortened, removed, or accounted for: Yes.   Equipment and supplies removed from bottom of stretcher: Yes.   Potentially toxic materials out of patient reach: Yes.   Sharps container removed or out of patient reach: Yes.

## 2015-01-05 LAB — CBC
HCT: 29.9 % — ABNORMAL LOW (ref 35.0–47.0)
Hemoglobin: 10 g/dL — ABNORMAL LOW (ref 12.0–16.0)
MCH: 31.7 pg (ref 26.0–34.0)
MCHC: 33.5 g/dL (ref 32.0–36.0)
MCV: 94.7 fL (ref 80.0–100.0)
PLATELETS: 153 10*3/uL (ref 150–440)
RBC: 3.15 MIL/uL — AB (ref 3.80–5.20)
RDW: 12.9 % (ref 11.5–14.5)
WBC: 4.6 10*3/uL (ref 3.6–11.0)

## 2015-01-05 LAB — BASIC METABOLIC PANEL
ANION GAP: 5 (ref 5–15)
BUN: 11 mg/dL (ref 6–20)
CALCIUM: 8.4 mg/dL — AB (ref 8.9–10.3)
CHLORIDE: 112 mmol/L — AB (ref 101–111)
CO2: 25 mmol/L (ref 22–32)
Creatinine, Ser: 0.54 mg/dL (ref 0.44–1.00)
GFR calc Af Amer: >60 mL/min (ref >60)
Glucose, Bld: 86 mg/dL (ref 65–99)
Potassium: 3.8 mmol/L (ref 3.5–5.1)
Sodium: 142 mmol/L (ref 135–145)

## 2015-01-05 MED ORDER — SODIUM CHLORIDE 0.9 % IV BOLUS (SEPSIS)
500.0000 mL | Freq: Once | INTRAVENOUS | Status: AC
  Administered 2015-01-05: 500 mL via INTRAVENOUS

## 2015-01-05 MED ORDER — SODIUM CHLORIDE 0.9 % IV BOLUS (SEPSIS)
1000.0000 mL | Freq: Once | INTRAVENOUS | Status: AC
  Administered 2015-01-05: 1000 mL via INTRAVENOUS

## 2015-01-05 NOTE — Progress Notes (Signed)
Notified Dr. Sheryle Hail of low BP and pt's inability to void. Bladder scan 353. MD ordered for foley to be inserted and another 500 mL NS bolus

## 2015-01-05 NOTE — Progress Notes (Signed)
Dr. Sheryle Hail visited with pt to speak about inserting foley. Pt agreed to leaving the foley in for only 2 hrs and MD stated it was okay.

## 2015-01-05 NOTE — Progress Notes (Signed)
[facility] - [place] at [facility]   PATIENT NAME: Jennifer [name]    MR#:  [id number]  DATE OF BIRTH:  1961/07/05  SUBJECTIVE:  Patient seems to be more appropriate this morning. She denies hallucinations. Sitter is at bedside who reports no agitation overnight.  REVIEW OF SYSTEMS:    Review of Systems  Constitutional: Negative for fever and chills.  Genitourinary: Negative for dysuria, urgency and frequency.  Neurological:       ALS with motor weakness   Psychiatric/Behavioral: Negative for depression and hallucinations.    Tolerating Diet: Yes      DRUG ALLERGIES:   Allergies  Allergen Reactions  . Bee Venom Swelling  . Shellfish Allergy Anaphylaxis  . Lipitor [Atorvastatin] Other (See Comments)    Reaction:  Muscle discomfort   . Oxycodone Other (See Comments)    Reaction:  Muscle stiffness   . Prednisone Other (See Comments)    Reaction:  Psychosis   . Vicodin [Hydrocodone-Acetaminophen] Other (See Comments)    Reaction:  Muscle stiffness   . Oxycontin [Oxycodone Hcl] Other (See Comments)    Reaction:  Muscle stiffness   . Peanut-Containing Drug Products Diarrhea    VITALS:  Blood pressure 137/75, pulse 73, temperature 98.1 F (36.7 C), temperature source Oral, resp. rate 17, height 4\' 11"  (1.499 m), weight 52.935 kg (116 lb 11.2 oz), SpO2 98 %.  PHYSICAL EXAMINATION:   Physical Exam  Constitutional: She is well-developed, well-nourished, and in no distress. No distress.  HENT:  Head: Normocephalic and atraumatic.  Eyes: No scleral icterus.  Neck: Normal range of motion. Neck supple. No tracheal deviation present.  Cardiovascular: Normal rate, regular rhythm and normal heart sounds.   Pulmonary/Chest: Effort normal and breath sounds normal. No respiratory distress. She has no wheezes. She has no rales. She exhibits no tenderness.  Abdominal: Bowel sounds are normal. She exhibits no distension. There is no tenderness. There is  no rebound.  Musculoskeletal: She exhibits no edema.  Neurological: She is alert.  Skin: Skin is warm. No rash noted.  Psychiatric: Affect normal.      LABORATORY PANEL:   CBC  Recent Labs Lab 01/05/15 0447  WBC 4.6  HGB 10.0*  HCT 29.9*  PLT 153   ------------------------------------------------------------------------------------------------------------------  Chemistries   Recent Labs Lab 01/04/15 1405 01/05/15 0447  NA 141 142  K 4.1 3.8  CL 108 112*  CO2 27 25  GLUCOSE 86 86  BUN 15 11  CREATININE 0.67 0.54  CALCIUM 9.2 8.4*  AST 20  --   ALT 20  --   ALKPHOS 43  --   BILITOT 0.5  --    ------------------------------------------------------------------------------------------------------------------  Cardiac Enzymes No results for input(s): TROPONINI in the last 168 hours. ------------------------------------------------------------------------------------------------------------------  RADIOLOGY:  Ct Abdomen Pelvis Wo Contrast  01/03/2015   CLINICAL DATA:  Urinary retention.  Urinary tract infection.  EXAM: CT ABDOMEN AND PELVIS WITHOUT CONTRAST  TECHNIQUE: Multidetector CT imaging of the abdomen and pelvis was performed following the standard protocol without IV contrast.  COMPARISON:  None.  FINDINGS: Musculoskeletal:  No aggressive osseous lesions.  Lung Bases: Atelectasis.  Liver: Unenhanced CT was performed per clinician order. Lack of IV contrast limits sensitivity and specificity, especially for evaluation of abdominal/pelvic solid viscera. Grossly normal.  Spleen:  Normal.  Gallbladder:  Normal.  Common bile duct:  Normal.  Pancreas:  Normal.  Adrenal glands:  Normal bilaterally.  Kidneys: Partially duplicated LEFT renal collecting system with prominent column a per  10. Tiny E LEFT upper pole area increased density probably representing milk of calcium in a cyst. No ureteral calculi. No hydronephrosis or perinephric inflammatory stranding.  Stomach:   Distended with oral contrast.  Small bowel:  Normal.  Colon: Large stool burden. Normal appendix. No obstruction or inflammatory changes of colon.  Pelvic Genitourinary: Distended urinary bladder. No inflammatory changes, debris, gas or mural thickening. Uterus and adnexa appear within normal limits.  Peritoneum: No free air or free fluid.  Vascular/lymphatic: Atherosclerosis without an acute vascular abnormality allowing for noncontrast technique.  Body Wall: RIGHT lower quadrant anterior abdominal wall subcutaneous injection site. Nonspecific stranding lateral to the LEFT hip in the subcutaneous fat.  IMPRESSION: 1. No acute abnormality. 2. Atherosclerosis.   Electronically Signed   By: Andreas Newport M.D.   On: 01/03/2015 14:46     ASSESSMENT AND PLAN:   54 year old female with a history of ALS who was recently hospitalized for urinary tract infection who presents with altered mental status from underlying acute cystitis.  1 altered mental status with disorientation: Patient seems to be more appropriate today. This is suspected to be due to acute cystitis. During her last hospitalization for urinary tract infection there was no urine culture. Patient is currently on broad-spectrum antibody including Invanz. I will await urine culture and blood culture. She will continue with a sitter. Patient was seen and evaluated by psychiatry. Consultation is appreciated. 2. Acute cystitis: As mentioned patient did not have a urine culture during her last auscultation. Urine culture has been ordered. For now she is on broad-spectrum antiemetics. Once we have the final urine culture report we can narrow this into buttock.   3. ALS: Patient continue her outpatient medications. Patient does follow up with neurology as an outpatient.    Management plans discussed with the patient and she is in agreement.  CODE STATUS: FULL  TOTAL TIME TAKING CARE OF THIS PATIENT: 33 minutes.   POSSIBLE D/C IN 1-2  DAYS,  DEPENDING ON CLINICAL CONDITION.   Jahleah Mariscal M.D on 01/05/2015 at 11:08 AM  Between 7am to 6pm - Pager - [phone] After 6pm go to [email or web address] - password [email or web address]  [place] [facility]  Office  [phone]  CC: Primary care physician; Ruthe Mannan, MD

## 2015-01-05 NOTE — Progress Notes (Signed)
Dr. Sheryle Hail notified of low BP. Pt is alert and responding. Liter bolus ordered.

## 2015-01-05 NOTE — Progress Notes (Signed)
bsc

## 2015-01-05 NOTE — Progress Notes (Signed)
Dr. Sheryle Hail notified of pt refusing to have foley inserted and that she wants in and out instead. Pt was informed and educated on the importance of the foley catheter and she continued to refused. Charge nurse was in room at this time. MD stated he will come in a little while to speak with patient.

## 2015-01-05 NOTE — Progress Notes (Signed)
Pt admitted to [facility] from ED. Alert and oriented. No c/o pain. No n/v noted.  VSS but BP remains on low side.  1Liter bolus and a bolus given to bring up BP per MD orders. No distress noted.  Will cont to monitor pt

## 2015-01-05 NOTE — Progress Notes (Signed)
Dr. Sheryle Hail notified of low BP after bolus being given. Pt alert and able to respond to me.  500 mL bolus ordered.

## 2015-01-06 LAB — URINE CULTURE
Culture: 5000
Special Requests: NORMAL

## 2015-01-06 MED ORDER — DIVALPROEX SODIUM 250 MG PO DR TAB: 250.0000 mg | DELAYED_RELEASE_TABLET | Freq: Two times a day (BID) | ORAL | Status: DC

## 2015-01-06 MED ORDER — AMOXICILLIN-POT CLAVULANATE 875-125 MG PO TABS: 1.0000 | ORAL_TABLET | Freq: Two times a day (BID) | ORAL | Status: DC

## 2015-01-06 NOTE — Progress Notes (Signed)
Bladder scan patient and the most volume was 200 ml.

## 2015-01-06 NOTE — Progress Notes (Addendum)
Pt d/c to home today via EMS transport.  Pt will resume Home Health Care.  IV removed intact.  Pt was able to void multiple times prior to d/c (See MAR).  Belongings returned to pt.  Pt d/c instructions reviewed and all questions and concerns addressed.  Rx printed and given to husband for home administration.  Medication Education printed on D/C paperwork.

## 2015-01-06 NOTE — Care Management Note (Signed)
Case Management Note  Patient Details  Name: Jennifer [name] MRN: [id number] Date of Birth: 1961-02-02  Subjective/Objective:          Discussed Ms Toves's home situation with Dr Juliene Pina and Dr Juliene Pina agreed to add a Social Worker to list of home health services. Ms Gluck has used [facility] as recently as May 2016. A referral for PT, RN, Aid, SW was faxed and called to [facility].              Expected Discharge Date:                  Expected Discharge Plan:     In-House Referral:     Discharge planning Services     Post Acute Care Choice:    Choice offered to:     DME Arranged:    DME Agency:     HH Arranged:    HH Agency:     Status of Service:     Medicare Important Message Given:    Date Medicare IM Given:    Medicare IM give by:    Date Additional Medicare IM Given:    Additional Medicare Important Message give by:     If discussed at Long Length of Stay Meetings, dates discussed:    Additional Comments:  Jerene Yeager A, RN 01/06/2015, 11:29 AM

## 2015-01-06 NOTE — Progress Notes (Signed)
Add home health referral for a Child psychotherapist. V.O. Dr Juliene Pina / Bryan Lemma, RN, BSN, CM.

## 2015-01-06 NOTE — Discharge Summary (Signed)
[facility] Physicians - [place] at [facility]   PATIENT NAME: Jennifer [name]    MR#:  [id number]  DATE OF BIRTH:  15-Apr-1961  DATE OF ADMISSION:  01/04/2015 ADMITTING PHYSICIAN: Ramonita Lab, MD  DATE OF DISCHARGE: 01/06/2015  PRIMARY CARE PHYSICIAN: Ruthe Mannan, MD    ADMISSION DIAGNOSIS:  Delirium [R41.0] Complicated UTI (urinary tract infection) [N39.0]  DISCHARGE DIAGNOSIS:  Active Problems:   Acute cystitis   Major psychotic depression, recurrent   SECONDARY DIAGNOSIS:   Past Medical History  Diagnosis Date  . ALS (amyotrophic lateral sclerosis)   . Depression     HOSPITAL COURSE:  This is a 54 year old female with a history of ALS who presented with aggressive behavior and mental status changes. For further details please refer to the H&P.  1 altered mental status with disorientation: Patient seems to be more appropriate. This is suspected to be due to acute cystitis. During her last hospitalization for urinary tract infection there was no urine culture. Patient was started on broad-spectrum antiemetics including Invanz. Her urine culture only grew out 5000 colonies. She was on ciprofloxacin as an outpatient. I'll changes to Augmentin. As mentioned her mental status is much improved. She appears to be at baseline.  Patient was seen and evaluated by psychiatry. Consultation is appreciated.   2. Acute cystitis: As mentioned patient did not have a urine culture during her last hospitalization. Her urine culture at this time is growing out 5000 colonies. She will be discharged on by mouth Augmentin.   3. ALS: Patient continue her outpatient medications. Patient does follow up with neurology as an outpatient. Patient was seen and evaluated by psychiatry while in the hospital. Dr. Toni Amend make some medication changes. He stopped the Wellbutrin due to the increased risk of seizures. She has known Depakote.    DISCHARGE CONDITIONS AND DIET:  Patient will be  discharged home with home health care Heart healthy diet  CONSULTS OBTAINED:  Treatment Team:  Ramonita Lab, MD Audery Amel, MD  DRUG ALLERGIES:   Allergies  Allergen Reactions  . Bee Venom Swelling  . Shellfish Allergy Anaphylaxis  . Lipitor [Atorvastatin] Other (See Comments)    Reaction:  Muscle discomfort   . Oxycodone Other (See Comments)    Reaction:  Muscle stiffness   . Prednisone Other (See Comments)    Reaction:  Psychosis   . Vicodin [Hydrocodone-Acetaminophen] Other (See Comments)    Reaction:  Muscle stiffness   . Oxycontin [Oxycodone Hcl] Other (See Comments)    Reaction:  Muscle stiffness   . Peanut-Containing Drug Products Diarrhea    DISCHARGE MEDICATIONS:   Current Discharge Medication List    START taking these medications   Details  amoxicillin-clavulanate (AUGMENTIN) 875-125 MG per tablet Take 1 tablet by mouth 2 (two) times daily. Qty: 16 tablet, Refills: 0    divalproex (DEPAKOTE) 250 MG DR tablet Take 1 tablet (250 mg total) by mouth every 12 (twelve) hours. Qty: 60 tablet, Refills: 0      CONTINUE these medications which have NOT CHANGED   Details  ARIPiprazole (ABILIFY) 5 MG tablet Take 1 tablet (5 mg total) by mouth daily. Qty: 30 tablet, Refills: 0    benzonatate (TESSALON) 100 MG capsule Take 1 capsule (100 mg total) by mouth 3 (three) times daily as needed for cough. Qty: 20 capsule, Refills: 0    cholecalciferol (VITAMIN D) 1000 UNITS tablet Take 1,000 Units by mouth daily.    Dextromethorphan-Quinidine (NUEDEXTA) 20-10 MG CAPS Take 1  capsule by mouth 2 (two) times daily.     diazepam (VALIUM) 10 MG tablet Take 10 mg by mouth every 6 (six) hours as needed for anxiety.    fluticasone (FLONASE) 50 MCG/ACT nasal spray Place 2 sprays into both nostrils 2 (two) times daily as needed for rhinitis.    ibuprofen (ADVIL,MOTRIN) 200 MG tablet Take 800 mg by mouth 3 (three) times daily as needed for mild pain.     lactulose (CHRONULAC)  10 GM/15ML solution Take 10 g by mouth daily as needed for mild constipation.     levETIRAcetam (KEPPRA) 500 MG tablet Take 500 mg by mouth 2 (two) times daily.     loratadine (CLARITIN) 10 MG tablet Take 10 mg by mouth daily as needed for allergies.     omeprazole (PRILOSEC) 40 MG capsule Take 40 mg by mouth daily.     ranitidine (ZANTAC) 150 MG tablet Take 150 mg by mouth daily as needed for heartburn.    tizanidine (ZANAFLEX) 2 MG capsule Take 2 mg by mouth at bedtime as needed for muscle spasms.       STOP taking these medications     buPROPion (WELLBUTRIN SR) 150 MG 12 hr tablet      ciprofloxacin (CIPRO) 500 MG tablet      clindamycin (CLEOCIN) 300 MG capsule      Coenzyme Q10 (COQ10 PO)               Today   CHIEF COMPLAINT:  Patient is feeling well this morning. Patient will need in and out catheter every 8 hours. Patient denies back pain. Patient denies dysuria.   VITAL SIGNS:  Blood pressure 125/69, pulse 67, temperature 97.7 F (36.5 C), temperature source Oral, resp. rate 16, height 4\' 11"  (1.499 m), weight 52.935 kg (116 lb 11.2 oz), SpO2 95 %.   REVIEW OF SYSTEMS:  Review of Systems  Constitutional: Negative for fever and chills.  Cardiovascular: Negative for palpitations.  Gastrointestinal: Negative for nausea, vomiting and abdominal pain.  Genitourinary: Negative for dysuria and urgency.  Neurological: Positive for speech change. Negative for dizziness, tingling and tremors.       She has ALS     PHYSICAL EXAMINATION:  GENERAL:  54 y.o.-year-old patient lying in the bed with no acute distress.  NECK:  Supple, no jugular venous distention. No thyroid enlargement, no tenderness.  LUNGS: Normal breath sounds bilaterally, no wheezing, rales,rhonchi  No use of accessory muscles of respiration.  CARDIOVASCULAR: S1, S2 normal. No murmurs, rubs, or gallops.  ABDOMEN: Soft, non-tender, non-distended. Bowel sounds present. No organomegaly or mass.   EXTREMITIES: No pedal edema, cyanosis, or clubbing.  PSYCHIATRIC: The patient is alert SKIN: No obvious rash, lesion, or ulcer.  Neuro: Cranial nerves II through XII are grossly intact. She has generalized weakness. She has poor speech from her ALS.  DATA REVIEW:   CBC  Recent Labs Lab 01/05/15 0447  WBC 4.6  HGB 10.0*  HCT 29.9*  PLT 153    Chemistries   Recent Labs Lab 01/04/15 1405 01/05/15 0447  NA 141 142  K 4.1 3.8  CL 108 112*  CO2 27 25  GLUCOSE 86 86  BUN 15 11  CREATININE 0.67 0.54  CALCIUM 9.2 8.4*  AST 20  --   ALT 20  --   ALKPHOS 43  --   BILITOT 0.5  --     Cardiac Enzymes No results for input(s): TROPONINI in the last 168 hours.  Microbiology Results  @  [id number]@  RADIOLOGY:  No results found.    Management plans discussed with the patient and she is in agreement. Stable for discharge home with home health care  Patient should follow up with PCP in one week  CODE STATUS:     Code Status Orders        Start     Ordered   01/04/15 1935  Full code   Continuous     01/04/15 1934      TOTAL TIME TAKING CARE OF THIS PATIENT: 35 minutes.    Malyn Aytes M.D on 01/06/2015 at 10:37 AM  Between 7am to 6pm - Pager - [phone] After 6pm go to [email or web address] - password [email or web address] [facility]  [place] [facility]  Office  [phone]  CC: Primary care physician; Ruthe Mannan, MD

## 2015-01-06 NOTE — Discharge Instructions (Signed)
*  Notify MD if your symptoms return and/or worsen. *Take all medications as prescribed. Your antibiotics should be taken until complete even if you are feeling better.  *Drink plenty of fluids while taking the antibiotics.  *Notify your doctor with all questions or concerns.  * A home health representative will be in contact with you regarding your home health schedule.

## 2015-01-06 NOTE — Care Management Note (Signed)
Case Management Note  Patient Details  Name: Rayni Hindsman MRN: [id number] Date of Birth: [birth year]  Subjective/Objective:         Spoke with Ms Zierke husband on the phone. He stated that he will not be transporting Ms Bollier home today and requested that [facility] arrange transportation. Ms Onder is bedbound. CSW Dellie Burns is arranging transportation.             Expected Discharge Date:                  Expected Discharge Plan:     In-House Referral:     Discharge planning Services     Post Acute Care Choice:    Choice offered to:     DME Arranged:    DME Agency:     HH Arranged:    HH Agency:     Status of Service:     Medicare Important Message Given:    Date Medicare IM Given:    Medicare IM give by:    Date Additional Medicare IM Given:    Additional Medicare Important Message give by:     If discussed at Long Length of Stay Meetings, dates discussed:    Additional Comments:  Jonica Bickhart A, RN 01/06/2015, 1:36 PM

## 2015-01-06 NOTE — Progress Notes (Signed)
After pt was d/c to home.  Husband called back to ask the RN what to do because the pt continues to bite herself and him.    I advised pt to contact [facility] [place], [place] at [phone] and to select the new patient option and that he would be able to talk w/a live person to set up home health care.  I also reccommended husband to administer medications as prescribed.  Additionally, I advised husband to wrap the pt's arms in a washcloth or towel or sheet to prevent pt from breaking the skin and to then again refer to the Home Health agency for continued home health care.    Pt husbands states understanding and will contact the Home Health agency for continued reference of care.

## 2015-01-07 ENCOUNTER — Telehealth: Payer: Self-pay | Admitting: Family Medicine

## 2015-01-07 ENCOUNTER — Other Ambulatory Visit: Payer: Self-pay | Admitting: Family Medicine

## 2015-01-07 NOTE — Telephone Encounter (Signed)
Jennifer [name] saw Ms  Blackwood today and wanted to get speech eval and treat

## 2015-01-07 NOTE — Telephone Encounter (Signed)
Rx called in to requested pharmacy 

## 2015-01-07 NOTE — Telephone Encounter (Signed)
Last f/u appt 07/2014 

## 2015-01-07 NOTE — Telephone Encounter (Signed)
Who is Angelica Chessman with? Mandy at [facility]? Ok to give verbal order.

## 2015-01-07 NOTE — Telephone Encounter (Signed)
Spoke to [place] and provided verbal orders as requested

## 2015-01-08 NOTE — Care Management (Signed)
Post discharge: Received call from [facility] with [facility] as they received referral from case manager in ED on 12/28/14. This patient came to 1a and this RNCM felt that psych nursing may be beneficial in the home which had been arranged with [place] home health. Discharging RNCM arranged home health through [facility] as final decision for care. Kenard Gower [facility]) updated to cancel home health and Genevieve Norlander also notified.

## 2015-01-09 ENCOUNTER — Other Ambulatory Visit: Payer: Self-pay | Admitting: Family Medicine

## 2015-01-10 ENCOUNTER — Telehealth: Payer: Self-pay | Admitting: Family Medicine

## 2015-01-10 ENCOUNTER — Ambulatory Visit: Payer: BLUE CROSS/BLUE SHIELD | Admitting: Family Medicine

## 2015-01-10 DIAGNOSIS — Z0289 Encounter for other administrative examinations: Secondary | ICD-10-CM

## 2015-01-10 NOTE — Telephone Encounter (Signed)
Patient did not come in for their appointment today for [facility] d/c.  Please let me know if patient needs to be contacted immediately for follow up or no follow up needed.

## 2015-01-11 ENCOUNTER — Telehealth: Payer: Self-pay

## 2015-01-11 NOTE — Telephone Encounter (Signed)
Ok to give verbal order as requested. 

## 2015-01-11 NOTE — Telephone Encounter (Signed)
Speech therapist with [facility] left v/m requesting verbal order for OT home health eval and treat for assistance with dressing.

## 2015-01-12 ENCOUNTER — Emergency Department
Admission: EM | Admit: 2015-01-12 | Discharge: 2015-01-12 | Disposition: A | Discharge status: Home / Self Care | Payer: BLUE CROSS/BLUE SHIELD | Attending: Emergency Medicine | Admitting: Emergency Medicine

## 2015-01-12 ENCOUNTER — Encounter: Payer: Self-pay | Admitting: Emergency Medicine

## 2015-01-12 DIAGNOSIS — Z87891 Personal history of nicotine dependence: Secondary | ICD-10-CM | POA: Insufficient documentation

## 2015-01-12 DIAGNOSIS — R300 Dysuria: Secondary | ICD-10-CM

## 2015-01-12 DIAGNOSIS — R4781 Slurred speech: Secondary | ICD-10-CM | POA: Insufficient documentation

## 2015-01-12 DIAGNOSIS — Z792 Long term (current) use of antibiotics: Secondary | ICD-10-CM | POA: Diagnosis not present

## 2015-01-12 DIAGNOSIS — Z79899 Other long term (current) drug therapy: Secondary | ICD-10-CM | POA: Insufficient documentation

## 2015-01-12 LAB — CBC WITH DIFFERENTIAL/PLATELET
Basophils Absolute: 0 10*3/uL (ref 0–0.1)
Basophils Relative: 0 %
Eosinophils Absolute: 0.2 10*3/uL (ref 0–0.7)
Eosinophils Relative: 3 %
HCT: 40.4 % (ref 35.0–47.0)
Hemoglobin: 13.5 g/dL (ref 12.0–16.0)
Lymphocytes Relative: 33 %
Lymphs Abs: 2.3 10*3/uL (ref 1.0–3.6)
MCH: 31.9 pg (ref 26.0–34.0)
MCHC: 33.4 g/dL (ref 32.0–36.0)
MCV: 95.4 fL (ref 80.0–100.0)
Monocytes Absolute: 0.4 10*3/uL (ref 0.2–0.9)
Monocytes Relative: 5 %
Neutro Abs: 4 10*3/uL (ref 1.4–6.5)
Neutrophils Relative %: 59 %
Platelets: 287 10*3/uL (ref 150–440)
RBC: 4.23 MIL/uL (ref 3.80–5.20)
RDW: 13 % (ref 11.5–14.5)
WBC: 7 10*3/uL (ref 3.6–11.0)

## 2015-01-12 LAB — URINALYSIS COMPLETE WITH MICROSCOPIC (ARMC ONLY)
Bacteria, UA: NONE SEEN
Bilirubin Urine: NEGATIVE
Glucose, UA: NEGATIVE mg/dL
Hgb urine dipstick: NEGATIVE
Ketones, ur: NEGATIVE mg/dL
Nitrite: NEGATIVE
Protein, ur: NEGATIVE mg/dL
Specific Gravity, Urine: 1.019 (ref 1.005–1.030)
Squamous Epithelial / HPF: NONE SEEN
pH: 6 (ref 5.0–8.0)

## 2015-01-12 LAB — COMPREHENSIVE METABOLIC PANEL WITH GFR
ALT: 16 U/L (ref 14–54)
AST: 17 U/L (ref 15–41)
Albumin: 4.5 g/dL (ref 3.5–5.0)
Alkaline Phosphatase: 43 U/L (ref 38–126)
Anion gap: 1 — ABNORMAL LOW (ref 5–15)
BUN: 12 mg/dL (ref 6–20)
CO2: 29 mmol/L (ref 22–32)
Calcium: 9.6 mg/dL (ref 8.9–10.3)
Chloride: 109 mmol/L (ref 101–111)
Creatinine, Ser: 0.74 mg/dL (ref 0.44–1.00)
GFR calc Af Amer: >60 mL/min
GFR calc non Af Amer: >60 mL/min
Glucose, Bld: 98 mg/dL (ref 65–99)
Potassium: 3.7 mmol/L (ref 3.5–5.1)
Sodium: 139 mmol/L (ref 135–145)
Total Bilirubin: 0.5 mg/dL (ref 0.3–1.2)
Total Protein: 7.6 g/dL (ref 6.5–8.1)

## 2015-01-12 LAB — LIPASE, BLOOD: Lipase: 31 U/L (ref 22–51)
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
## 2015-01-12 NOTE — ED Notes (Signed)
On return from lunch updated that pt's family wants pt eval'd by psych stating pt not cooperative with treatment. Past history of psychiatric related problems d/t her als
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
## 2015-01-12 NOTE — ED Notes (Signed)
States taking fluids, voiding small amounts

## 2015-01-12 NOTE — Discharge Instructions (Signed)
Dysuria  Your urine sample appears clean. Your  exam and vital signs and lab work are reassuring. At the present time, he may have some discomfort because of frequent recent catheterizations. There is no evidence of worsening infection.  Please follow-up with your doctor this week. Continue to follow with your home health staff. Return to the ER if you are unable to urinate, develop a fever, severe weakness, nausea, vomiting, abdominal pain or other new concerns arise. Please continue your Augmentin as previously prescribed.  Dysuria is the medical term for pain with urination. There are many causes for dysuria, but urinary tract infection is the most common. If a urinalysis was performed it can show that there is a urinary tract infection. A urine culture confirms that you or your child is sick. You will need to follow up with a healthcare provider because:  If a urine culture was done you will need to know the culture results and treatment recommendations.  If the urine culture was positive, you or your child will need to be put on antibiotics or know if the antibiotics prescribed are the right antibiotics for your urinary tract infection.  If the urine culture is negative (no urinary tract infection), then other causes may need to be explored or antibiotics need to be stopped. Today laboratory work may have been done and there does not seem to be an infection. If cultures were done they will take at least 24 to 48 hours to be completed. Today x-rays may have been taken and they read as normal. No cause can be found for the problems. The x-rays may be re-read by a radiologist and you will be contacted if additional findings are made. You or your child may have been put on medications to help with this problem until you can see your primary caregiver. If the problems get better, see your primary caregiver if the problems return. If you were given antibiotics (medications which kill germs), take all  of the mediations as directed for the full course of treatment.  If laboratory work was done, you need to find the results. Leave a telephone number where you can be reached. If this is not possible, make sure you find out how you are to get test results. HOME CARE INSTRUCTIONS   Drink lots of fluids. For adults, drink eight, 8 ounce glasses of clear juice or water a day. For children, replace fluids as suggested by your caregiver.  Empty the bladder often. Avoid holding urine for long periods of time.  After a bowel movement, women should cleanse front to back, using each tissue only once.  Empty your bladder before and after sexual intercourse.  Take all the medicine given to you until it is gone. You may feel better in a few days, but TAKE ALL MEDICINE.  Avoid caffeine, tea, alcohol and carbonated beverages, because they tend to irritate the bladder.  In men, alcohol may irritate the prostate.  Only take over-the-counter or prescription medicines for pain, discomfort, or fever as directed by your caregiver.  If your caregiver has given you a follow-up appointment, it is very important to keep that appointment. Not keeping the appointment could result in a chronic or permanent injury, pain, and disability. If there is any problem keeping the appointment, you must call back to this facility for assistance. SEEK IMMEDIATE MEDICAL CARE IF:   Back pain develops.  A fever develops.  There is nausea (feeling sick to your stomach) or vomiting (throwing up).  Problems are no better with medications or are getting worse. MAKE SURE YOU:   Understand these instructions.  Will watch your condition.  Will get help right away if you are not doing well or get worse. Document Released: 04/10/2004 Document Revised: 10/05/2011 Document Reviewed: 02/16/2008 [facility] Patient Information 2015 [place], [place]. This information is not intended to replace advice given to you by your health care  provider. Make sure you discuss any questions you have with your health care provider.

## 2015-01-12 NOTE — ED Notes (Signed)
Consult complete and pt ready for discharge

## 2015-01-14 NOTE — Telephone Encounter (Signed)
Spoke to Amy with [facility] and provided verbal orders

## 2015-01-15 LAB — URINE CULTURE
Culture: NO GROWTH
SPECIAL REQUESTS: NORMAL

## 2015-01-16 ENCOUNTER — Ambulatory Visit (INDEPENDENT_AMBULATORY_CARE_PROVIDER_SITE_OTHER): Payer: BLUE CROSS/BLUE SHIELD | Admitting: Family Medicine

## 2015-01-16 ENCOUNTER — Encounter: Payer: Self-pay | Admitting: Family Medicine

## 2015-01-16 VITALS — BP 144/94 | HR 87 | Temp 98.1°F

## 2015-01-16 DIAGNOSIS — F333 Major depressive disorder, recurrent, severe with psychotic symptoms: Secondary | ICD-10-CM

## 2015-01-16 DIAGNOSIS — G1221 Amyotrophic lateral sclerosis: Secondary | ICD-10-CM

## 2015-01-16 DIAGNOSIS — R339 Retention of urine, unspecified: Secondary | ICD-10-CM | POA: Insufficient documentation

## 2015-01-16 DIAGNOSIS — N3000 Acute cystitis without hematuria: Secondary | ICD-10-CM

## 2015-01-16 NOTE — Progress Notes (Signed)
Subjective:   Patient ID: Jennifer [name], female    DOB: [birth year], 54 y.o.   MRN: [id number]  Jennifer [name] is a pleasant 54 y.o. year old female with h/o ALS and major depression who presents to clinic today with husband [name] for Hospitalization Follow-up  on 01/16/2015  HPI:  Since I saw her, she unfortunately has been seen at [facility] multiple times- including several admission and ER visits, mostly for depression, altered mental status and urinary issues (dysuria and retention).  Notes reviewed.  Most recent admission was on 01/04/15- admitted for complicated UTI and delirium.  Seen in ER 4 days ago (6/18) for dysuria.  She was already taking Augmentin at that time.  UA pos for LE only.  WBC wnl.  Advised to continue Augmentin.Urine cx neg.  Psych and SW were consulted again due to increased agitation episodes at home.   [name] says that her insurance will not pay for more hours of home health.  As of right now, PT and RN come to home a few times a week.  [name] is completely dependent for all ADLs and "just wants to be left alone."  Per her husband, SW could not place her in a SNF.  Having persistent issues with depression and agitation.  Currently taking Abilify, Lamictal, depakoke. Valium helps with muscle spasms.  Current Outpatient Prescriptions on File Prior to Visit  Medication Sig Dispense Refill  . ARIPiprazole (ABILIFY) 5 MG tablet Take 1 tablet (5 mg total) by mouth daily. 30 tablet 0  . benzonatate (TESSALON) 100 MG capsule Take 1 capsule (100 mg total) by mouth 3 (three) times daily as needed for cough. 20 capsule 0  . buPROPion (WELLBUTRIN SR) 150 MG 12 hr tablet TAKE 1 TABLET (150 MG TOTAL) BY MOUTH 4 (FOUR) TIMES DAILY. 120 tablet 1  . cholecalciferol (VITAMIN D) 1000 UNITS tablet Take 1,000 Units by mouth daily.    [name] Dextromethorphan-Quinidine (NUEDEXTA) 20-10 MG CAPS Take 1 capsule by mouth 2 (two) times daily.     . diazepam (VALIUM) 10 MG tablet TAKE 1/2 - 1 TABLET  EVERY 6 HOURS AS NEEDED 60 tablet 0  . divalproex (DEPAKOTE) 250 MG DR tablet Take 1 tablet (250 mg total) by mouth every 12 (twelve) hours. 60 tablet 0  . fluticasone (FLONASE) 50 MCG/ACT nasal spray USE 2 SPRAYS IN EACH NOSTRIL EVERY DAY AS DIRECTED 16 g 2  . ibuprofen (ADVIL,MOTRIN) 200 MG tablet Take 800 mg by mouth 3 (three) times daily as needed for mild pain.     [name] lactulose (CHRONULAC) 10 GM/15ML solution Take 10 g by mouth daily as needed for mild constipation.     . levETIRAcetam (KEPPRA) 500 MG tablet Take 500 mg by mouth 2 (two) times daily.     [name] loratadine (CLARITIN) 10 MG tablet Take 10 mg by mouth daily as needed for allergies.     [name] omeprazole (PRILOSEC) 40 MG capsule Take 40 mg by mouth daily.     . ranitidine (ZANTAC) 150 MG tablet Take 150 mg by mouth daily as needed for heartburn.    . tizanidine (ZANAFLEX) 2 MG capsule Take 2 mg by mouth at bedtime as needed for muscle spasms.      No current facility-administered medications on file prior to visit.    Allergies  Allergen Reactions  . Bee Venom Swelling  . Shellfish Allergy Anaphylaxis  . Lipitor [Atorvastatin] Other (See Comments)    Reaction:  Muscle discomfort   . Oxycodone  Other (See Comments)    Reaction:  Muscle stiffness   . Prednisone Other (See Comments)    Reaction:  Psychosis   . Vicodin [Hydrocodone-Acetaminophen] Other (See Comments)    Reaction:  Muscle stiffness   . Oxycontin [Oxycodone Hcl] Other (See Comments)    Reaction:  Muscle stiffness   . Peanut-Containing Drug Products Diarrhea    Past Medical History  Diagnosis Date  . ALS (amyotrophic lateral sclerosis)   . Depression     Past Surgical History  Procedure Laterality Date  . Cesarean section    . Nasal sinus surgery      Family History  Problem Relation Age of Onset  . COPD Mother   . Thyroid disease Mother   . Diverticulosis Mother   . Heart attack Mother   . Gout Father   . Depression Sister   . Thyroid disease Sister    . Cancer Brother     kidney  . Heart attack Maternal Grandmother   . Heart attack Maternal Grandfather   . Heart attack Paternal Grandfather   . Hyperlipidemia Sister   . Evelene Croon Parkinson White syndrome Sister 25  . Depression Brother   . Hypertension Brother     History   Social History  . Marital Status: Married    Spouse Name: N/A  . Number of Children: 1  . Years of Education: N/A   Occupational History  . home    Social History Main Topics  . Smoking status: Former Games developer  . Smokeless tobacco: Never Used  . Alcohol Use: No  . Drug Use: No  . Sexual Activity: No   Other Topics Concern  . Not on file   Social History Narrative   Daughter has severe reflux since infancy   The PMH, PSH, Social History, Family History, Medications, and allergies have been reviewed in [facility], and have been updated if relevant.    Review of Systems  Constitutional: Positive for appetite change.  Respiratory: Negative.   Cardiovascular: Negative.   Genitourinary:       Retention  Musculoskeletal: Positive for myalgias and neck stiffness.  Psychiatric/Behavioral: Positive for behavioral problems, confusion, sleep disturbance, dysphoric mood, decreased concentration and agitation. Negative for suicidal ideas. The patient is nervous/anxious.   All other systems reviewed and are negative.      Objective:    BP 144/94 mmHg  Pulse 87  Temp(Src) 98.1 F (36.7 C) (Oral)  Wt   SpO2 99%   Physical Exam  Constitutional:  Sitting in wheelchair, tearful, speech more difficult to understand  Cardiovascular: Normal rate.   Pulmonary/Chest: Effort normal.  Nursing note and vitals reviewed.         Assessment & Plan:   Major psychotic depression, recurrent  Acute cystitis without hematuria  Amyotrophic lateral sclerosis  Urinary retention No Follow-up on file.

## 2015-01-16 NOTE — Assessment & Plan Note (Signed)
Progressive >45 minutes spent in face to face time with patient, >50% spent in counselling or coordination of care concerning ALS, urinary retention, depression. Discussed importance of seeing her ALS doctor, Dr. Zannie Kehr ASAP to help set up I and O cath. Letter written to [facility] today asking for her to qualify for 24 hour home health care.   Also placed hospice referral today. I left a message with Misty Stanley, Dr. Judeth Cornfield coordinator, to call me back. No changes to rxs made today.

## 2015-01-16 NOTE — Progress Notes (Signed)
Pre visit review using our clinic review tool, if applicable. No additional management support is needed unless otherwise documented below in the visit note. 

## 2015-01-17 ENCOUNTER — Telehealth: Payer: Self-pay | Admitting: Family Medicine

## 2015-01-17 NOTE — Telephone Encounter (Signed)
Left voicemail for Jennifer [name] with [employer] at [phone].  Asked her to call me back concerning how I can help appeal [employer] for more home health care for Jennifer [name].

## 2015-01-18 ENCOUNTER — Telehealth: Payer: Self-pay | Admitting: Family Medicine

## 2015-01-18 ENCOUNTER — Encounter: Payer: Self-pay | Admitting: *Deleted

## 2015-01-18 ENCOUNTER — Emergency Department
Admission: EM | Admit: 2015-01-18 | Discharge: 2015-01-18 | Disposition: A | Discharge status: Home / Self Care | Payer: BLUE CROSS/BLUE SHIELD | Attending: Emergency Medicine | Admitting: Emergency Medicine

## 2015-01-18 DIAGNOSIS — R339 Retention of urine, unspecified: Secondary | ICD-10-CM | POA: Insufficient documentation

## 2015-01-18 DIAGNOSIS — Z87891 Personal history of nicotine dependence: Secondary | ICD-10-CM | POA: Diagnosis not present

## 2015-01-18 DIAGNOSIS — R443 Hallucinations, unspecified: Secondary | ICD-10-CM | POA: Insufficient documentation

## 2015-01-18 DIAGNOSIS — K5900 Constipation, unspecified: Secondary | ICD-10-CM | POA: Diagnosis not present

## 2015-01-18 LAB — CBC WITH DIFFERENTIAL/PLATELET
Basophils Absolute: 0 10*3/uL (ref 0–0.1)
Basophils Relative: 1 %
Eosinophils Absolute: 0.2 10*3/uL (ref 0–0.7)
Eosinophils Relative: 3 %
HEMATOCRIT: 41 % (ref 35.0–47.0)
Hemoglobin: 14.2 g/dL (ref 12.0–16.0)
LYMPHS PCT: 30 %
Lymphs Abs: 2 10*3/uL (ref 1.0–3.6)
MCH: 32.5 pg (ref 26.0–34.0)
MCHC: 34.6 g/dL (ref 32.0–36.0)
MCV: 94.1 fL (ref 80.0–100.0)
Monocytes Absolute: 0.3 10*3/uL (ref 0.2–0.9)
Monocytes Relative: 5 %
NEUTROS ABS: 4.1 10*3/uL (ref 1.4–6.5)
Neutrophils Relative %: 61 %
Platelets: 266 10*3/uL (ref 150–440)
RBC: 4.36 MIL/uL (ref 3.80–5.20)
RDW: 13 % (ref 11.5–14.5)
WBC: 6.7 10*3/uL (ref 3.6–11.0)

## 2015-01-18 LAB — COMPREHENSIVE METABOLIC PANEL
ALBUMIN: 4.1 g/dL (ref 3.5–5.0)
ALT: 11 U/L — ABNORMAL LOW (ref 14–54)
AST: 16 U/L (ref 15–41)
Alkaline Phosphatase: 38 U/L (ref 38–126)
Anion gap: 11 (ref 5–15)
BUN: 11 mg/dL (ref 6–20)
CALCIUM: 9.4 mg/dL (ref 8.9–10.3)
CHLORIDE: 107 mmol/L (ref 101–111)
CO2: 24 mmol/L (ref 22–32)
Creatinine, Ser: 0.58 mg/dL (ref 0.44–1.00)
GFR calc Af Amer: >60 mL/min (ref >60)
GFR calc non Af Amer: >60 mL/min (ref >60)
Glucose, Bld: 97 mg/dL (ref 65–99)
Potassium: 3.4 mmol/L — ABNORMAL LOW (ref 3.5–5.1)
Sodium: 142 mmol/L (ref 135–145)
Total Bilirubin: 0.3 mg/dL (ref 0.3–1.2)
Total Protein: 6.9 g/dL (ref 6.5–8.1)

## 2015-01-18 LAB — URINALYSIS COMPLETE WITH MICROSCOPIC (ARMC ONLY)
BACTERIA UA: NONE SEEN
BILIRUBIN URINE: NEGATIVE
GLUCOSE, UA: NEGATIVE mg/dL
HGB URINE DIPSTICK: NEGATIVE
Ketones, ur: NEGATIVE mg/dL
Nitrite: NEGATIVE
PH: 6 (ref 5.0–8.0)
Protein, ur: NEGATIVE mg/dL
Specific Gravity, Urine: 1.014 (ref 1.005–1.030)

## 2015-01-18 LAB — LIPASE, BLOOD: Lipase: 34 U/L (ref 22–51)

## 2015-01-18 MED ORDER — CIPROFLOXACIN HCL 250 MG PO TABS: 250.0000 mg | ORAL_TABLET | Freq: Two times a day (BID) | ORAL | Status: AC

## 2015-01-18 MED ORDER — FLEET ENEMA 7-19 GM/118ML RE ENEM
1.0000 | ENEMA | Freq: Once | RECTAL | Status: AC
  Administered 2015-01-18: 1 via RECTAL

## 2015-01-18 NOTE — Telephone Encounter (Signed)
Lm on Jennifer [name]'s vm providing verbal order. No answer to determine status of ALS appt

## 2015-01-18 NOTE — ED Notes (Signed)
Pt to ED from home via EMS due to urinary retention x 1 day and half. Constipation for 4-5 days. Also, per EMS and husband pt began to hallucinate this am of "nurse taking vitals and sister and mother who are no longer with [place] anymore" Pt is baseline ALS cognition and physical status.

## 2015-01-18 NOTE — Telephone Encounter (Signed)
Yes ok to I and O cath her.  Has she seen her ALS doctor??
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
## 2015-01-18 NOTE — Telephone Encounter (Signed)
[facility] sent me a message that they do not offer Home Psychiatry visits at all. I called Jennifer [name] to let him know and they were at [facility] ED right now, and they were talking to a Psychiatrist there at the ED.

## 2015-01-18 NOTE — Telephone Encounter (Signed)
Ok to approve order if they do have psychiatric services.

## 2015-01-18 NOTE — Telephone Encounter (Signed)
With the [facility] referral, [facility] will not be going out to the home as you cant have Home Health and Hospice at the same time. This request will have to be done with Hospice to the best of my knowledge.

## 2015-01-18 NOTE — ED Notes (Signed)
Request for EMS transport placed at this time

## 2015-01-18 NOTE — Discharge Instructions (Signed)
Constipation °Constipation is when a person has fewer than three bowel movements a week, has difficulty having a bowel movement, or has stools that are dry, hard, or larger than normal. As people grow older, constipation is more common. If you try to fix constipation with medicines that make you have a bowel movement (laxatives), the problem may get worse. Long-term laxative use may cause the muscles of the colon to become weak. A low-fiber diet, not taking in enough fluids, and taking certain medicines may make constipation worse.  °CAUSES  °· Certain medicines, such as antidepressants, pain medicine, iron supplements, antacids, and water pills.   °· Certain diseases, such as diabetes, irritable bowel syndrome (IBS), thyroid disease, or depression.   °· Not drinking enough water.   °· Not eating enough fiber-rich foods.   °· Stress or travel.   °· Lack of physical activity or exercise.   °· Ignoring the urge to have a bowel movement.   °· Using laxatives too much.   °SIGNS AND SYMPTOMS  °· Having fewer than three bowel movements a week.   °· Straining to have a bowel movement.   °· Having stools that are hard, dry, or larger than normal.   °· Feeling full or bloated.   °· Pain in the lower abdomen.   °· Not feeling relief after having a bowel movement.   °DIAGNOSIS  °Your health care provider will take a medical history and perform a physical exam. Further testing may be done for severe constipation. Some tests may include: °· A barium enema X-ray to examine your rectum, colon, and, sometimes, your small intestine.   °· A sigmoidoscopy to examine your lower colon.   °· A colonoscopy to examine your entire colon. °TREATMENT  °Treatment will depend on the severity of your constipation and what is causing it. Some dietary treatments include drinking more fluids and eating more fiber-rich foods. Lifestyle treatments may include regular exercise. If these diet and lifestyle recommendations do not help, your health care  provider may recommend taking over-the-counter laxative medicines to help you have bowel movements. Prescription medicines may be prescribed if over-the-counter medicines do not work.  °HOME CARE INSTRUCTIONS  °· Eat foods that have a lot of fiber, such as fruits, vegetables, whole grains, and beans. °· Limit foods high in fat and processed sugars, such as french fries, hamburgers, cookies, candies, and soda.   °· A fiber supplement may be added to your diet if you cannot get enough fiber from foods.   °· Drink enough fluids to keep your urine clear or pale yellow.   °· Exercise regularly or as directed by your health care provider.   °· Go to the restroom when you have the urge to go. Do not hold it.   °· Only take over-the-counter or prescription medicines as directed by your health care provider. Do not take other medicines for constipation without talking to your health care provider first.   °SEEK IMMEDIATE MEDICAL CARE IF:  °· You have bright red blood in your stool.   °· Your constipation lasts for more than 4 days or gets worse.   °· You have abdominal or rectal pain.   °· You have thin, pencil-like stools.   °· You have unexplained weight loss. °MAKE SURE YOU:  °· Understand these instructions. °· Will watch your condition. °· Will get help right away if you are not doing well or get worse. °Document Released: 04/10/2004 Document Revised: 07/18/2013 Document Reviewed: 04/24/2013 °ExitCare® Patient Information ©2015 [employer]. This information is not intended to replace advice given to you by your health care provider. Make sure you discuss any questions   you have with your health care provider. ° °Acute Urinary Retention °Acute urinary retention is the temporary inability to urinate. This is an uncommon problem in women. It can be caused by: °· Infection. °· A side effect of a medicine. °· A problem in a nearby organ that presses or squeezes on the bladder or the urethra (the tube that drains the  bladder). °· Psychological problems. °·  Surgery on your bladder, urethra, or pelvic organs that causes obstruction to the outflow of urine from your bladder. °HOME CARE INSTRUCTIONS  °If you are sent home with a Foley catheter and a drainage system, you will need to discuss the best course of action with your health care provider. While the catheter is in, maintain a good intake of fluids. Keep the drainage bag emptied and lower than your catheter. This is so that contaminated urine will not flow back into your bladder, which could lead to a urinary tract infection. °There are two main types of drainage bags. One is a large bag that usually is used at night. It has a good capacity that will allow you to sleep through the night without having to empty it. The second type is called a leg bag. It has a smaller capacity so it needs to be emptied more frequently. However, the main advantage is that it can be attached by a leg strap and goes underneath your clothing, allowing you the freedom to move about or leave your home. °Only take over-the-counter or prescription medicines for pain, discomfort, or fever as directed by your health care provider.  °SEEK MEDICAL CARE IF: °· You develop a low-grade fever. °· You experience spasms or leakage of urine with the spasms. °SEEK IMMEDIATE MEDICAL CARE IF:  °· You develop chills or fever. °· Your catheter stops draining urine. °· Your catheter falls out. °· You start to develop increased bleeding that does not respond to rest and increased fluid intake. °MAKE SURE YOU: °· Understand these instructions. °· Will watch your condition. °· Will get help right away if you are not doing well or get worse. °Document Released: 07/12/2006 Document Revised: 05/03/2013 Document Reviewed: 12/22/2012 °ExitCare® Patient Information ©2015 [employer]. This information is not intended to replace advice given to you by your health care provider. Make sure you discuss any questions you have  with your health care provider. ° °

## 2015-01-18 NOTE — Telephone Encounter (Signed)
Thank you for looking into that for them.

## 2015-01-18 NOTE — Telephone Encounter (Signed)
Actually just called Hospice and they just got off the phone with  Jennifer [name] and he has refused Hospice services at this time. I dont know if [facility] has Psychiatric nurses at all.

## 2015-01-18 NOTE — ED Notes (Addendum)
Pt and family verbalized no further needs at this time after speaking to charge RN greg. Pt tolerated enema well. Pt and family instructed to let staff know once pt experiences bm, and will then be cleaned up. Pt and family verbalized understanding at this time, verbalized no further needs. Pt resting comfortably in bed, watching tv at this time

## 2015-01-18 NOTE — ED Notes (Addendum)
Per request of pt and family, pt wanted foley taken out. MD williams made aware. MD williams verbalized to family the need for foley at this time and pt and family still requesting for it to be taken out. MD williams verbal okay to remove foley at this time due to d/c. Family made aware of d/c, family agaitated again and verbalized need to "stay over night for observation because she is unstable and she may have long term mental health issues." MD williams at bedside talking to family explaining no need to be admitted. Family angry and upset at this time, verbalized no further needs at this time.

## 2015-01-18 NOTE — ED Notes (Signed)
Pt and family made aware of delay due to EMS back up, verbalized understanding and verbalized no further needs at this time. All watching tv.

## 2015-01-18 NOTE — ED Notes (Signed)
Pt's family requesting pysch consult due to pt's hallucinations, MD williams made aware. Per MD williams MD claypaks states to continue pt treatment as is with no changes at this time due to no change in pysch status. MD williams at bedside at this time explaining to pt and pt's family. Pt and family very agitated and upset at this time due to lack of changes in depakote and abilify. MD williams further explaining no need to alter any meds at this time. Pt family requesting "the higher up at this time". Charge RN greg notified and talking to family at this time

## 2015-01-18 NOTE — Telephone Encounter (Signed)
I did not know this is an option.  Of course, I approve this order, but I am unaware of a service like this.  Shirlee Limerick, do you know?  I spoke with Misty Stanley at her ALS doctor's office who said that she has a Child psychotherapist working with her.  Has he asked her about this?

## 2015-01-18 NOTE — Telephone Encounter (Signed)
French Ana from [facility] called, she is with pt right now.  Pt and family state that she has not urinated since Wednesday.  She stated a request regarding and in and out cath, and needs an order for that.  Please call tracy back at [phone], thanks

## 2015-01-18 NOTE — ED Notes (Signed)
Pt tolerated removal of foley well at this time, family and pt verbalized no further needs at this time.

## 2015-01-18 NOTE — ED Notes (Addendum)
Pt still without BM, MD williams made aware, MD williams at bedside at this time rectal exam. MD williams states no presence of stool in the rectum at this time, family and pt made aware and verbalized understanding. No further orders given at this time. Pt resting comfortably in bed at this time. Watching tv, sleeping at this time, chest rise and fall noted. Vitals wnl.

## 2015-01-18 NOTE — Telephone Encounter (Signed)
Husband is requesting that a psychiatrist come to the home for a home visit, [facility].  Call back [phone]. Thanks

## 2015-01-21 ENCOUNTER — Telehealth: Payer: Self-pay | Admitting: Family Medicine

## 2015-01-21 ENCOUNTER — Telehealth: Payer: Self-pay

## 2015-01-21 NOTE — Telephone Encounter (Signed)
Spoke to pts husband and informed him that a message was left with Kennith Centerracey from [facility], providing verbal order. Tracey's telephone # provided and pts husband states he will contact office back if Kennith Centerracey did not receive message

## 2015-01-21 NOTE — Telephone Encounter (Signed)
The order should be prn.

## 2015-01-21 NOTE — Telephone Encounter (Signed)
I am unclear with what she is asking. Is she asking if she can schedule a home visit with social work?  I myself do not do home visits.  If she needs an order for social work to do a home visit, then yes, she can have that verbal order.

## 2015-01-21 NOTE — Telephone Encounter (Signed)
Attempted to contact pt. Order has already been called in, and verbal order proved for in and out cath

## 2015-01-21 NOTE — Telephone Encounter (Signed)
Jennifer [name] Child psychotherapist with [facility] left v/m; Jennifer [name] wants to know if can schedule home visit to discuss placement.Please advise.

## 2015-01-21 NOTE — Telephone Encounter (Signed)
Husband called in requesting an order for [facility] to insert and remove catheter when they come.  Please call [phone] with any questions. Thanks.

## 2015-01-21 NOTE — Telephone Encounter (Signed)
[facility] needs clarification on order for in and out catheter.  Is it  prn or is it a 1 time only order?  Nurse did the catheter today any did not get any fluid.  She was catheterized Friday night at the ED and they got 500cc  Aslo pt has appt with ALS dr in August cb number [phone]. For [facility], [facility]

## 2015-01-21 NOTE — Telephone Encounter (Signed)
Becky with [facility] left v/m(did not leave contact #); pts husband [facility] about request for catheter. Becky said in v/m if Dr Dayton MartesAron is ordering catheter to contact [facility] and give order. Also request contact pts husband with update.

## 2015-01-22 ENCOUNTER — Telehealth: Payer: Self-pay | Admitting: Family Medicine

## 2015-01-22 ENCOUNTER — Telehealth: Payer: Self-pay

## 2015-01-22 NOTE — Telephone Encounter (Signed)
[facility]oke to [facility] and advised per Dr Redmond PullingAron;verbally expressed understanding.

## 2015-01-22 NOTE — Telephone Encounter (Signed)
Spoke to [employer] and provide info for PRN

## 2015-01-22 NOTE — Telephone Encounter (Signed)
Jennifer BraunKaren with [facility] requesting verbal order for Social work visit, for possible placement, things have changed drastically in the home  cb number [phone] cell

## 2015-01-22 NOTE — Telephone Encounter (Signed)
Ok to give order as requested. 

## 2015-01-22 NOTE — Telephone Encounter (Signed)
Spoke to Brock Hallracey earlier this am and provided verbal order. See additional phone note

## 2015-01-22 NOTE — Telephone Encounter (Signed)
Shirlee LimerickMarion, what do I need to do?

## 2015-01-22 NOTE — Telephone Encounter (Signed)
Jennifer [name] left v/m; pt has [facility] in the home for 4 different services and pts husband wants psychiatric nurse to come in to the home. [facility] does not have psychiatric nurse available; Jennifer [name] [facility] which [facility] nurse. Jennifer [name] request change done ASAP, needs psych nurse to regulate pt's meds, the meds given to pt while in hospital is not working well and Jennifer [name] has his hands full. [facility] is [phone] and [facility] contact # is [phone].Jennifer [name] request cb.

## 2015-01-22 NOTE — Telephone Encounter (Signed)
Called patients husband because I called [facility] and their RN Psych nurse is out on medical leave. I spoke with [facility] and the thing about ordering a Psych Eval is that the M.D. Ordering the Psych RN must be willing to prescribe the medications that the RN recommends. And also change doses and meds if needed. I told Mr Jennifer [name] that [facility] to check if they have a Psych RN and also to discuss with Dr Dayton MartesAron if she would want to prescribe the Psch meds. Mr Jennifer [name] stopped all the meds that Dr Toni Amendlapacs put her on in the hospital and now she is only taking her regular medications. The patient is getting Skilled nursing, PT, OT, Home HealthAide and new order for social worker verbally was just done. She will need New HH orders if Dr Dayton MartesAron willing to prescibe the meds. Will discuss with Dr Dayton MartesAron on Wednesday.

## 2015-01-23 ENCOUNTER — Telehealth: Payer: Self-pay | Admitting: Family Medicine

## 2015-01-23 DIAGNOSIS — F32A Depression, unspecified: Secondary | ICD-10-CM

## 2015-01-23 DIAGNOSIS — F329 Major depressive disorder, single episode, unspecified: Secondary | ICD-10-CM

## 2015-01-23 NOTE — Telephone Encounter (Signed)
Noted.  Thank you.  Referral placed. 

## 2015-01-23 NOTE — Telephone Encounter (Signed)
[facility] and said [facility] cant [facility] for Psychiatric Skilled nursing care because the patients insurance has to use [employer] and they have to be the ones coordinating this Home Care for Psych. I called the patients husband and he wants you to put an Outpatient Psychiatry Referral in and he will start with taking her to the MD and hopefully will get her on the right medication.

## 2015-01-24 ENCOUNTER — Telehealth: Payer: Self-pay

## 2015-01-24 NOTE — Telephone Encounter (Signed)
I will defer to Dr. Dayton MartesAron. Per her last note 01/16/15 she [name] 24 hour [facility]. Not sure of the outcome of that letter, or if the insurance company has had time to make decision about this.

## 2015-01-24 NOTE — Telephone Encounter (Signed)
Ok to extend home health x 1 week

## 2015-01-24 NOTE — Telephone Encounter (Signed)
Debbie nurse with [facility] left v/m requesting verbal order to extend home health orders 1 x a week for 6 weeks.Please advise.

## 2015-01-24 NOTE — Telephone Encounter (Signed)
Nicholos JohnsKathleen nurse case mgr with [employer]  [place] left v/m; Nicholos JohnsKathleen is assigned to assist pt with home care needs; there is no long term home care or custodial home care available thru ins coverage. Mr Shella SpearingBoyer is going to attempt to get pt on medicare due to disability.Nicholos JohnsKathleen wants to try to help get behavioral health assistance; Mr Shella SpearingBoyer under a lot of stress. Nicholos JohnsKathleen request cb with any ideas or assistance getting behavioral health for family due to pt developing behavior problems.

## 2015-01-25 ENCOUNTER — Emergency Department: Payer: BLUE CROSS/BLUE SHIELD

## 2015-01-25 ENCOUNTER — Emergency Department
Admission: EM | Admit: 2015-01-25 | Discharge: 2015-01-26 | Disposition: A | Discharge status: Home / Self Care | Payer: BLUE CROSS/BLUE SHIELD | Attending: Emergency Medicine | Admitting: Emergency Medicine

## 2015-01-25 ENCOUNTER — Encounter: Payer: Self-pay | Admitting: Emergency Medicine

## 2015-01-25 DIAGNOSIS — Y9389 Activity, other specified: Secondary | ICD-10-CM | POA: Insufficient documentation

## 2015-01-25 DIAGNOSIS — G1221 Amyotrophic lateral sclerosis: Secondary | ICD-10-CM | POA: Diagnosis present

## 2015-01-25 DIAGNOSIS — S5012XA Contusion of left forearm, initial encounter: Secondary | ICD-10-CM | POA: Diagnosis not present

## 2015-01-25 DIAGNOSIS — F32A Depression, unspecified: Secondary | ICD-10-CM

## 2015-01-25 DIAGNOSIS — Z87891 Personal history of nicotine dependence: Secondary | ICD-10-CM | POA: Diagnosis not present

## 2015-01-25 DIAGNOSIS — Z79899 Other long term (current) drug therapy: Secondary | ICD-10-CM | POA: Diagnosis not present

## 2015-01-25 DIAGNOSIS — Y998 Other external cause status: Secondary | ICD-10-CM | POA: Diagnosis not present

## 2015-01-25 DIAGNOSIS — Y9289 Other specified places as the place of occurrence of the external cause: Secondary | ICD-10-CM | POA: Diagnosis not present

## 2015-01-25 DIAGNOSIS — Z7951 Long term (current) use of inhaled steroids: Secondary | ICD-10-CM | POA: Diagnosis not present

## 2015-01-25 DIAGNOSIS — F329 Major depressive disorder, single episode, unspecified: Secondary | ICD-10-CM

## 2015-01-25 DIAGNOSIS — X58XXXA Exposure to other specified factors, initial encounter: Secondary | ICD-10-CM | POA: Insufficient documentation

## 2015-01-25 DIAGNOSIS — S4991XA Unspecified injury of right shoulder and upper arm, initial encounter: Secondary | ICD-10-CM | POA: Diagnosis not present

## 2015-01-25 DIAGNOSIS — G249 Dystonia, unspecified: Secondary | ICD-10-CM | POA: Diagnosis present

## 2015-01-25 DIAGNOSIS — F4323 Adjustment disorder with mixed anxiety and depressed mood: Secondary | ICD-10-CM | POA: Diagnosis present

## 2015-01-25 LAB — COMPREHENSIVE METABOLIC PANEL
ALBUMIN: 4.2 g/dL (ref 3.5–5.0)
ALT: 8 U/L — AB (ref 14–54)
AST: 26 U/L (ref 15–41)
Alkaline Phosphatase: 36 U/L — ABNORMAL LOW (ref 38–126)
Anion gap: 9 (ref 5–15)
BUN: 12 mg/dL (ref 6–20)
CALCIUM: 9.5 mg/dL (ref 8.9–10.3)
CO2: 30 mmol/L (ref 22–32)
Chloride: 105 mmol/L (ref 101–111)
Creatinine, Ser: 0.82 mg/dL (ref 0.44–1.00)
GFR calc non Af Amer: >60 mL/min (ref >60)
Glucose, Bld: 83 mg/dL (ref 65–99)
POTASSIUM: 3.5 mmol/L (ref 3.5–5.1)
Sodium: 144 mmol/L (ref 135–145)
Total Bilirubin: 0.5 mg/dL (ref 0.3–1.2)
Total Protein: 7.3 g/dL (ref 6.5–8.1)

## 2015-01-25 LAB — CBC WITH DIFFERENTIAL/PLATELET
BASOS PCT: 1 %
Basophils Absolute: 0 10*3/uL (ref 0–0.1)
EOS ABS: 0.2 10*3/uL (ref 0–0.7)
EOS PCT: 4 %
HCT: 39.2 % (ref 35.0–47.0)
Hemoglobin: 13.2 g/dL (ref 12.0–16.0)
LYMPHS ABS: 2 10*3/uL (ref 1.0–3.6)
LYMPHS PCT: 40 %
MCH: 31.7 pg (ref 26.0–34.0)
MCHC: 33.8 g/dL (ref 32.0–36.0)
MCV: 93.7 fL (ref 80.0–100.0)
Monocytes Absolute: 0.4 10*3/uL (ref 0.2–0.9)
Monocytes Relative: 8 %
NEUTROS ABS: 2.4 10*3/uL (ref 1.4–6.5)
NEUTROS PCT: 47 %
Platelets: 187 10*3/uL (ref 150–440)
RBC: 4.18 MIL/uL (ref 3.80–5.20)
RDW: 12.9 % (ref 11.5–14.5)
WBC: 5.1 10*3/uL (ref 3.6–11.0)

## 2015-01-25 LAB — TROPONIN I: Troponin I: <0.03 ng/mL (ref <0.031)

## 2015-01-25 MED ORDER — LORAZEPAM 2 MG/ML IJ SOLN
INTRAMUSCULAR | Status: AC
  Administered 2015-01-25: 1 mg via INTRAVENOUS
  Filled 2015-01-25: qty 1

## 2015-01-25 MED ORDER — LORAZEPAM 2 MG/ML IJ SOLN
1.0000 mg | Freq: Once | INTRAMUSCULAR | Status: AC
  Administered 2015-01-25: 1 mg via INTRAVENOUS
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
## 2015-01-25 NOTE — BH Assessment (Signed)
Assessment Note  Jennifer [name] is an 54 y.o. female who presents to the ER via EMS due to family calling 911, out of concerns about her self injurious behaviors. According to the patient's husband and daughter, the patient was biting herself and throwing herself on the floor. While she was doing this, she was saying she wanted to die. Patient has been diagnosed with ALS (Amyotrophic lateral sclerosis) and has had a difficult time adjusting to the the changes it has caused in her life. Patient has had several visits to the ER, due to similarly behaviors. Other times, she was having AV/H but it was the side effects from the medications/steriods she was giving. Family also reports the patient mood is unpredictable and her behaviors can be uncertain. Her family describes her as being "out of it," during the times she is doing things to harm herself.  When the family was describing her behaviors and mood, the patient didn't deny it, nor did she deny having any knowledge of what they were talking about. Per her report, "I don't know why I'm doing this."  Patient denies AV/H.  She states she is dealing with depression.     Axis I: Major Depression, Recurrent severe Axis III:  Past Medical History  Diagnosis Date  . ALS (amyotrophic lateral sclerosis)   . Depression    Axis IV: other psychosocial or environmental problems, problems related to social environment and problems with access to health care services  Past Medical History:  Past Medical History  Diagnosis Date  . ALS (amyotrophic lateral sclerosis)   . Depression     Past Surgical History  Procedure Laterality Date  . Cesarean section    . Nasal sinus surgery      Family History:  Family History  Problem Relation Age of Onset  . COPD Mother   . Thyroid disease Mother   . Diverticulosis Mother   . Heart attack Mother   . Gout Father   . Depression Sister   . Thyroid disease Sister   . Cancer Brother     kidney  . Heart  attack Maternal Grandmother   . Heart attack Maternal Grandfather   . Heart attack Paternal Grandfather   . Hyperlipidemia Sister   . Evelene Croon Parkinson White syndrome Sister 25  . Depression Brother   . Hypertension Brother     Social History:  reports that she has quit smoking. She has never used smokeless tobacco. She reports that she does not drink alcohol or use illicit drugs.  Additional Social History:  Alcohol / Drug Use Pain Medications: None Reported Prescriptions: None Reported Over the Counter: None Reported History of alcohol / drug use?: No history of alcohol / drug abuse Longest period of sobriety (when/how long):  (None Reported) Negative Consequences of Use:  (None Reported) Withdrawal Symptoms:  (None Reported)  CIWA: CIWA-Ar BP: 116/75 mmHg Pulse Rate: 79 COWS:    Allergies:  Allergies  Allergen Reactions  . Bee Venom Swelling  . Shellfish Allergy Anaphylaxis  . Lipitor [Atorvastatin] Other (See Comments)    Reaction:  Muscle discomfort   . Oxycodone Other (See Comments)    Reaction:  Muscle stiffness   . Prednisone Other (See Comments)    Reaction:  Psychosis   . Vicodin [Hydrocodone-Acetaminophen] Other (See Comments)    Reaction:  Muscle stiffness   . Hydrocodone     Other reaction(s): Other STIFFENED HER WHOLE BODY.  [name] Oxycontin [Oxycodone Hcl] Other (See Comments)    Reaction:  Muscle stiffness   . Peanut-Containing Drug Products Diarrhea    Home Medications:  (Not in a hospital admission)  OB/GYN Status:  No LMP recorded. Patient is postmenopausal.  General Assessment Data [facility] Assessment: In system Is this a Tele or Face-to-Face Assessment?: Face-to-Face Is this an Initial Assessment or a Re-assessment for this encounter?: Initial Assessment Marital status: Married HackberryMaiden name: n/a Is patient pregnant?: No Living Arrangements: Spouse/significant other Can pt return to current living arrangement?:  Yes Admission Status: Voluntary Is patient capable of signing voluntary admission?: Yes Referral Source: Other Insurance type: Scientist, research (physical sciences)BCBS  Medical Screening Exam [facility]-in ONLY) Medical Exam completed: Yes  Crisis Care Plan Living Arrangements: Spouse/significant other Name of Psychiatrist: n/a Name of Therapist: n/a  Education Status Is patient currently in school?: No Current Grade: n/a Highest grade of school patient has completed: Some College Name of school: n/a Contact person: n/a  Risk to self with the past 6 months Suicidal Ideation: No-Not Currently/Within Last 6 Months Has patient been a risk to self within the past 6 months prior to admission? : Yes Suicidal Intent: No-Not Currently/Within Last 6 Months Has patient had any suicidal intent within the past 6 months prior to admission? : Yes Is patient at risk for suicide?: No Suicidal Plan?: No-Not Currently/Within Last 6 Months Has patient had any suicidal plan within the past 6 months prior to admission? : Yes Access to Means: No What has been your use of drugs/alcohol within the last 12 months?: None Reported Previous Attempts/Gestures: Yes How many times?: 1 Other Self Harm Risks: Throwing herself on the floor, biting self. Triggers for Past Attempts: Other (Comment) (Current sickness) Intentional Self Injurious Behavior: Cutting, Bruising, Damaging Comment - Self Injurious Behavior: Patient is throwing herself on the floor. (Using a chair to bruise herself and biting herself) Family Suicide History: Unknown Recent stressful life event(s): Other (Comment) (Current Sickness) Persecutory voices/beliefs?: No Depression: Yes Depression Symptoms: Despondent, Tearfulness, Isolating, Guilt, Loss of interest in usual pleasures, Feeling worthless/self pity, Feeling angry/irritable Substance abuse history and/or treatment for substance abuse?: No Suicide prevention information given to non-admitted patients: Not  applicable  Risk to Others within the past 6 months Homicidal Ideation: No Does patient have any lifetime risk of violence toward others beyond the six months prior to admission? : No Thoughts of Harm to Others: No Current Homicidal Intent: No Current Homicidal Plan: No Access to Homicidal Means: No Identified Victim: None Reported History of harm to others?: No Assessment of Violence: None Noted Violent Behavior Description: None Reported Does patient have access to weapons?: No Criminal Charges Pending?: No Does patient have a court date: No Is patient on probation?: No  Psychosis Hallucinations: None noted Delusions: None noted  Mental Status Report Appearance/Hygiene: Unremarkable, In scrubs, In hospital gown Eye Contact: Fair Motor Activity: Rigidity, Unsteady Speech: Pressured, Slurred, Slow Level of Consciousness: Alert Mood: Depressed, Anxious, Ambivalent, Empty, Helpless, Sad, Worthless, low self-esteem, Pleasant, Despair Affect: Apathetic, Appropriate to circumstance, Sad Anxiety Level: Moderate Thought Processes: Coherent, Relevant Judgement: Impaired Orientation: Person, Place, Time, Situation, Appropriate for developmental age Obsessive Compulsive Thoughts/Behaviors: None  Cognitive Functioning Concentration: Normal Memory: Recent Intact, Remote Intact IQ: Average Insight: Poor Impulse Control: Poor Appetite: Poor Weight Loss: 0 Weight Gain: 0 Sleep: Decreased Total Hours of Sleep: 5 Vegetative Symptoms: Staying in bed, Decreased grooming (Due to sickness, she need assistance with ADL's)  ADLScreening [facility] Assessment Services) Patient's cognitive ability adequate to safely complete daily activities?: No Patient  able to express need for assistance with ADLs?: Yes Independently performs ADLs?: No (Pt has ALS (amyotrophic lateral sclerosis))  Prior Inpatient Therapy Prior Inpatient Therapy: No Prior Therapy Dates: n/a Prior Therapy  Facilty/Provider(s): n/a Reason for Treatment: n/a  Prior Outpatient Therapy Prior Outpatient Therapy: Yes Prior Therapy Dates: Current Prior Therapy Facilty/Provider(s): Her Neurologist Reason for Treatment: ALS Does patient have an ACCT team?: No Does patient have Intensive In-House Services?  : No Does patient have [facility] services? : No Does patient have P4[facility] services?: No  ADL Screening (condition at time of admission) Patient's cognitive ability adequate to safely complete daily activities?: No Patient able to express need for assistance with ADLs?: Yes Independently performs ADLs?: No (Pt has ALS (amyotrophic lateral sclerosis))       Abuse/Neglect Assessment (Assessment to be complete while patient is alone) Physical Abuse: Denies Verbal Abuse: Denies Sexual Abuse: Denies Exploitation of patient/patient's resources: Denies Self-Neglect: Denies Values / Beliefs Cultural Requests During Hospitalization: None Spiritual Requests During Hospitalization: None Consults Spiritual Care Consult Needed: No Social Work Consult Needed: No Merchant navy officer (For Healthcare) Does patient have an advance directive?: No Would patient like information on creating an advanced directive?: No - patient declined information    Additional Information 1:1 In Past 12 Months?: No CIRT Risk: No Elopement Risk: No Does patient have medical clearance?: Yes  Child/Adolescent Assessment Running Away Risk: Denies (Patient is an adult)  Disposition:  Disposition Initial Assessment Completed for this Encounter: Yes Disposition of Patient: Other dispositions (Pt. to be seen by Psych MD.) Other disposition(s): Other (Comment) (Pt. to be seen by Psych MD)  On Site Evaluation by:   Reviewed with Physician:    Lilyan Gilford, MS, LCAS, LPC, NCC, CCSI 01/25/2015 9:19 PM

## 2015-01-25 NOTE — Progress Notes (Signed)
   01/25/15 1900  Clinical Encounter Type  Visited With Patient and family together  Visit Type Initial  Spiritual Encounters  Spiritual Needs Prayer  Stress Factors  Patient Stress Factors Health changes   Faith tradition: Catholic Status: Speech impediment Family: daughter and husband bedside Age/Sex: [age]yrs/female Visit Assessment: Chaplain offered prayers for comfort and conversed with patient and family as well as made the patient aware that a priest could be arranged to visit with her if needed; She has been made aware that pastoral care is 24x7 here in the hospital  Chaplains and pastoral care can be reached at [phone] via online request

## 2015-01-25 NOTE — BHH Counselor (Signed)
Referral information faxed to:  [facility]   [facility]   [facility]   [facility]   [facility]   [place]   [facility]   [place]   [facility]   [place] [facility]   [facility]   [place]   Writer called [facility]([place] [phone]) and was instructed to page [phone]) their intake staff. Writer didn't receive a phone call back. Will try back again.

## 2015-01-25 NOTE — ED Notes (Signed)
Here for falling out of recliner and injury to right arm--upper.  whtih bruising.  Pt also told ems she wante4d to kill self and has been biting her left wrist area.  She was just weaned off of ability and depakote due to hallucinations.

## 2015-01-25 NOTE — Telephone Encounter (Signed)
Letter available in pts chart. Dr Dayton MartesAron prepared the letter after pts OV, in the event she would need it.

## 2015-01-25 NOTE — BHH Counselor (Signed)
Pt declined by [facility] due to being out of catchment area per [employer].

## 2015-01-25 NOTE — Telephone Encounter (Signed)
Lm on Debbie's vm and provided verbal order. Advised to contact to contact office for any additional questions
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
## 2015-01-25 NOTE — Telephone Encounter (Signed)
Spoke to [employer]leen at [employer]. Letter faxed to her directly at [phone]. Stated that she will need a physical copy once Dr Dayton MartesAron returns and will contact office on Wednesday afternoon or Thursday am. Letter reprinted and placed in Dr Elmer SowAron's box for sig

## 2015-01-26 DIAGNOSIS — F329 Major depressive disorder, single episode, unspecified: Secondary | ICD-10-CM | POA: Diagnosis not present

## 2015-01-26 MED ORDER — LEVETIRACETAM 500 MG PO TABS
ORAL_TABLET | ORAL | Status: AC
  Administered 2015-01-26: 1000 mg via ORAL
  Filled 2015-01-26: qty 2

## 2015-01-26 MED ORDER — DIAZEPAM 5 MG PO TABS
10.0000 mg | ORAL_TABLET | Freq: Once | ORAL | Status: AC
  Administered 2015-01-26: 10 mg via ORAL

## 2015-01-26 MED ORDER — BUPROPION HCL ER (SR) 150 MG PO TB12
300.0000 mg | ORAL_TABLET | Freq: Every day | ORAL | Status: DC
  Administered 2015-01-26: 300 mg via ORAL
  Filled 2015-01-26 (×2): qty 2

## 2015-01-26 MED ORDER — CIPROFLOXACIN HCL 500 MG PO TABS
500.0000 mg | ORAL_TABLET | Freq: Two times a day (BID) | ORAL | Status: DC
  Administered 2015-01-26: 500 mg via ORAL

## 2015-01-26 MED ORDER — CIPROFLOXACIN HCL 500 MG PO TABS
ORAL_TABLET | ORAL | Status: AC
  Administered 2015-01-26: 500 mg via ORAL
  Filled 2015-01-26: qty 1

## 2015-01-26 MED ORDER — LEVETIRACETAM 500 MG PO TABS
1000.0000 mg | ORAL_TABLET | Freq: Once | ORAL | Status: AC
  Administered 2015-01-26: 1000 mg via ORAL

## 2015-01-26 MED ORDER — DIAZEPAM 5 MG PO TABS
ORAL_TABLET | ORAL | Status: AC
  Administered 2015-01-26: 10 mg via ORAL
  Filled 2015-01-26: qty 2

## 2015-01-26 NOTE — ED Notes (Signed)
sw and psychiatrist in with pt and family

## 2015-01-26 NOTE — ED Notes (Signed)
Pt awake at this time. Nurse to bedside for update on plan of care. Nurse calls pt's daughter per request. Husband and daughter on the way. Diet tray ordered for pt from cafeteria. Pt repositioned in bed. Denies any pain or any other concerns at this time.

## 2015-01-26 NOTE — ED Notes (Signed)
Pt placed on bedpan =- no urine

## 2015-01-26 NOTE — ED Notes (Signed)
Pt states pain to left arm over IV site. Kerlex was in place to prevent pt from removing IV per previous nurse. IV flushable with positive blood return. Clean dry and intact. Will continue to monitor site. Pt agrees not to remove IV from arm. Family at bedside.

## 2015-01-26 NOTE — Consult Note (Signed)
Date of Service: 01/26/2015  Name: Jennifer [name] DOB: 05-Jan-1961 MRN: [id number]   S: pt is a 54 y.o. with ALS and MDD, recurrent, severe without psychotic features who is currently doing well today. The pt was seen with her husband and daughter. The pt's reasons for admission were reviewed. Brief supportive therapy was conducted and methods to decrease manipulation of the family were discussed. Pt denies SI, HI and AVH.    Objective:   Vitals:  Filed Vitals:   01/26/15 1100 01/26/15 1130 01/26/15 1200 01/26/15 1844  BP: 117/80 112/78 107/76 122/74  Pulse: 80   74  Temp:      TempSrc:      Resp: Height:      Weight:      SpO2: 95%   98%   .  Labs:  Results for orders placed or performed during the hospital encounter of 01/25/15  Comprehensive metabolic panel  Result Value Ref Range   Sodium 144 135 - 145 mmol/L   Potassium 3.5 3.5 - 5.1 mmol/L   Chloride 105 101 - 111 mmol/L   CO2 30 22 - 32 mmol/L   Glucose, Bld 83 65 - 99 mg/dL   BUN 12 6 - 20 mg/dL   Creatinine, Ser 4.09 0.44 - 1.00 mg/dL   Calcium 9.5 8.9 - 81.1 mg/dL   Total Protein 7.3 6.5 - 8.1 g/dL   Albumin 4.2 3.5 - 5.0 g/dL   AST 26 15 - 41 U/L   ALT 8 (L) 14 - 54 U/L   Alkaline Phosphatase 36 (L) 38 - 126 U/L   Total Bilirubin 0.5 0.3 - 1.2 mg/dL   GFR calc non Af Amer >60 >60 mL/min   GFR calc Af Amer >60 >60 mL/min   Anion gap 9 5 - 15  Troponin I  Result Value Ref Range   Troponin I <0.03 <0.031 ng/mL  CBC with Differential  Result Value Ref Range   WBC 5.1 3.6 - 11.0 K/uL   RBC 4.18 3.80 - 5.20 MIL/uL   Hemoglobin 13.2 12.0 - 16.0 g/dL   HCT 91.4 78.2 - 95.6 %   MCV 93.7 80.0 - 100.0 fL   MCH 31.7 26.0 - 34.0 pg   MCHC 33.8 32.0 - 36.0 g/dL   RDW 21.3 08.6 - 57.8 %   Platelets 187 150 - 440 K/uL   Neutrophils Relative % 47 %   Neutro Abs 2.4 1.4 - 6.5 K/uL   Lymphocytes Relative 40 %   Lymphs Abs 2.0 1.0 - 3.6 K/uL   Monocytes Relative 8 %   Monocytes Absolute 0.4 0.2 - 0.9  K/uL   Eosinophils Relative 4 %   Eosinophils Absolute 0.2 0 - 0.7 K/uL   Basophils Relative 1 %   Basophils Absolute 0.0 0 - 0.1 K/uL   Medical Review Of Systems: A comprehensive review of systems was negative.  Mental Status Evaluation:  General Appearance Well groomed  General Behavior Calm and cooperative  Psychomotor Activity decreased  Gait and Station No assessed due to pt's physical status  Speech   Garbles, decreased volume, normal tone, slow rate, decreased prosody, normal amount  Mood   irritable  Affect    irritable  Thought Process linear, coherent, goal directed  Associations Intact  Thought Content/Perceptual Disturbances denies suicidal/homicidal ideation, auditory/visual hallucinations, paranoia, delusions, grandiosity, increased goal directed activity, preoccupation, obsessions/compulsions and phobias  Cognition/Sensorium  orientation intact (AAOx4), memory intact, attention intact, language normal, fund of knowledge  intact  Insight  fair  Judgment fair     Assessment   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 Pt denies SI, HI and AVH.   Treatment Plan   1. Discharge home.  2. See plan for more details.   Gena Frayyan G Dariya Gainer, MD 01/28/2015  House Officer [facility] Department of Psychiatry & Behavioral Health

## 2015-01-26 NOTE — Discharge Instructions (Signed)
Depression °Depression refers to feeling sad, low, down in the dumps, blue, gloomy, or empty. In general, there are two kinds of depression: °1. Normal sadness or normal grief. This kind of depression is one that we all feel from time to time after upsetting life experiences, such as the loss of a job or the ending of a relationship. This kind of depression is considered normal, is short lived, and resolves within a few days to 2 weeks. Depression experienced after the loss of a loved one (bereavement) often lasts longer than 2 weeks but normally gets better with time. °2. Clinical depression. This kind of depression lasts longer than normal sadness or normal grief or interferes with your ability to function at home, at work, and in school. It also interferes with your personal relationships. It affects almost every aspect of your life. Clinical depression is an illness. °Symptoms of depression can also be caused by conditions other than those mentioned above, such as: °· Physical illness. Some physical illnesses, including underactive thyroid gland (hypothyroidism), severe anemia, specific types of cancer, diabetes, uncontrolled seizures, heart and lung problems, strokes, and chronic pain are commonly associated with symptoms of depression. °· Side effects of some prescription medicine. In some people, certain types of medicine can cause symptoms of depression. °· Substance abuse. Abuse of alcohol and illicit drugs can cause symptoms of depression. °SYMPTOMS °Symptoms of normal sadness and normal grief include the following: °· Feeling sad or crying for short periods of time. °· Not caring about anything (apathy). °· Difficulty sleeping or sleeping too much. °· No longer able to enjoy the things you used to enjoy. °· Desire to be by oneself all the time (social isolation). °· Lack of energy or motivation. °· Difficulty concentrating or remembering. °· Change in appetite or weight. °· Restlessness or  agitation. °Symptoms of clinical depression include the same symptoms of normal sadness or normal grief and also the following symptoms: °· Feeling sad or crying all the time. °· Feelings of guilt or worthlessness. °· Feelings of hopelessness or helplessness. °· Thoughts of suicide or the desire to harm yourself (suicidal ideation). °· Loss of touch with reality (psychotic symptoms). Seeing or hearing things that are not real (hallucinations) or having false beliefs about your life or the people around you (delusions and paranoia). °DIAGNOSIS  °The diagnosis of clinical depression is usually based on how bad the symptoms are and how long they have lasted. Your health care provider will also ask you questions about your medical history and substance use to find out if physical illness, use of prescription medicine, or substance abuse is causing your depression. Your health care provider may also order blood tests. °TREATMENT  °Often, normal sadness and normal grief do not require treatment. However, sometimes antidepressant medicine is given for bereavement to ease the depressive symptoms until they resolve. °The treatment for clinical depression depends on how bad the symptoms are but often includes antidepressant medicine, counseling with a mental health professional, or both. Your health care provider will help to determine what treatment is best for you. °Depression caused by physical illness usually goes away with appropriate medical treatment of the illness. If prescription medicine is causing depression, talk with your health care provider about stopping the medicine, decreasing the dose, or changing to another medicine. °Depression caused by the abuse of alcohol or illicit drugs goes away when you stop using these substances. Some adults need professional help in order to stop drinking or using drugs. °SEEK IMMEDIATE MEDICAL   CARE IF:  You have thoughts about hurting yourself or others.  You lose touch  with reality (have psychotic symptoms).  You are taking medicine for depression and have a serious side effect. FOR MORE INFORMATION  National Alliance on Mental Illness: www.nami.[employer]  National Institute of Mental Health: http://www.maynard.net/www.nimh.nih.gov Document Released: 07/10/2000 Document Revised: 11/27/2013 Document Reviewed: 10/12/2011 [facility] Patient Information 2015 [place]. This information is not intended to replace advice given to you by your health care provider. Make sure you discuss any questions you have with your health care provider.   Please follow up with your doctor for referral to a private psychiatrist. Return as needed.

## 2015-01-26 NOTE — ED Provider Notes (Signed)
Addendum patient has been seen by Dr. Shirlee LatchMcLean diagnosis is depression or major depression patient and husband and daughter discussed with Dr. Shirlee LatchMcLean the options and the upshot is that they will follow up with private psychiatrist outpatient and return for any further problems patient will be discharged with that diagnosis continue on her medicines  Arnaldo NatalPaul F Dawne Casali, MD 01/26/15 1827

## 2015-01-26 NOTE — Progress Notes (Signed)
CSW received return phone call from [facility], per Dr. Beatris Ship patient was declined due to being unable to meet her medical needs.  CSW provided Psych MD with an update.    CSW and Psych MD met with patient and family to provide an update on no bed offers for inpatient treatment, provide emotional support to patient and family as well as discussed outpatient treatment options.  Patient's husband currently has a Transport planner with [facility] that is assisting with meeting all of patient's needs.  Husband will follow up with patient's PCP on Tuesday to get a referral to an outpatient psychiatrist at [facility].  Patient is in agreement with the plan.    CSW informed by Psych MD that patient will be discharged home with outpatient follow up.  Casimer Lanius. Latanya Presser, MSW Clinical Social Work Department Emergency Room [phone] 5:27 PM

## 2015-01-26 NOTE — ED Notes (Signed)
Pt resting in room eye closed. Call bell in reach.

## 2015-01-26 NOTE — ED Notes (Signed)
Family at bedside. Social worker informed and will be in to talk with family and pt.

## 2015-01-26 NOTE — BH Specialist Note (Signed)
[facility] to state that, client had been declined due to unit acuity; and that client's medical needs exceeds unit capacity."

## 2015-01-26 NOTE — ED Notes (Signed)
Pt requesting for me to put her on the toilet - explained i cant d/t her being a full lift (unable to stand or help in any way) but offered bedpan. Pt declined and states her husband will do it. Husband not in room. Asked her to call if she needs bedpan.

## 2015-01-26 NOTE — Progress Notes (Signed)
Patient has been denied by several inpatient hospitals: [facility] due to high medical acuity, [facility] and [facility] no beds, [facility] and [facility] patient must be 54 years old.  Call to [facility], per [facility] no beds available,   Call to [facility] spoke to [place], Psych MD also spoke to their MD, willing to review patient's referral. [place] faxed patient's referral packet to [facility] met with patient and family at bedside to provide an update.  Patient and family understands patient has no bed offers at this time and that CSW continues to explore all options in locating inpatient treatment.  However, family understands they may need to seek outpatient treatment for patient if unable to obtain a bed.  States they are currently trying to find an Scientific laboratory technician.  Per patient's husband they are waiting on patient's PCP to get an order for patient to go to [facility].  Family is willing to take patient to outpatient appointment and patient states she is willing go.   CSW will continue to follow patient for ongoing and disposition needs based on recommendation from Psych MD and wait for response from [facility]. Latanya Presser, MSW Clinical Social Work Department Emergency Room [phone] 11:53 AM

## 2015-01-29 ENCOUNTER — Other Ambulatory Visit: Payer: Self-pay | Admitting: Family Medicine

## 2015-01-30 NOTE — Telephone Encounter (Signed)
Last f/u appt 12/2014-hospital f/u

## 2015-01-30 NOTE — Telephone Encounter (Signed)
Mr Jennifer [name] left v/m requesting valium instructions be changed to can take q 4 h prn. Mr Jennifer [name] is aware pharmacy was going to request refill. Mr Jennifer [name] request cb.

## 2015-01-30 NOTE — Telephone Encounter (Signed)
Rx called in to requested pharmacy 

## 2015-01-31 ENCOUNTER — Telehealth: Payer: Self-pay

## 2015-01-31 MED ORDER — DIAZEPAM 10 MG PO TABS: ORAL_TABLET | ORAL | Status: DC

## 2015-01-31 NOTE — Telephone Encounter (Signed)
Ok to increase to Valium 10 mg - 1/2 to 1 tab every 4 hours as needed.

## 2015-01-31 NOTE — Telephone Encounter (Signed)
[facility]
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
## 2015-01-31 NOTE — Telephone Encounter (Signed)
Spoke to pts husband and informed him to be sure to advise ALS Dr. Name not in chart

## 2015-01-31 NOTE — Telephone Encounter (Signed)
How many are you wanting to dispense for refills?

## 2015-01-31 NOTE — Telephone Encounter (Signed)
90

## 2015-01-31 NOTE — Addendum Note (Signed)
Addended by: Desmond DikeKNIGHT, Damontae Loppnow H on: 01/31/2015 10:23 AM   Modules accepted: Orders

## 2015-01-31 NOTE — Telephone Encounter (Signed)
I'm sorry to hear this.  Please also make her ALS doctor aware.

## 2015-02-03 ENCOUNTER — Emergency Department
Admission: EM | Admit: 2015-02-03 | Discharge: 2015-02-03 | Disposition: A | Discharge status: Home / Self Care | Payer: BLUE CROSS/BLUE SHIELD | Attending: Emergency Medicine | Admitting: Emergency Medicine

## 2015-02-03 DIAGNOSIS — Z79899 Other long term (current) drug therapy: Secondary | ICD-10-CM | POA: Insufficient documentation

## 2015-02-03 DIAGNOSIS — R338 Other retention of urine: Secondary | ICD-10-CM

## 2015-02-03 DIAGNOSIS — Z87891 Personal history of nicotine dependence: Secondary | ICD-10-CM | POA: Diagnosis not present

## 2015-02-03 DIAGNOSIS — R339 Retention of urine, unspecified: Secondary | ICD-10-CM | POA: Diagnosis present

## 2015-02-03 LAB — URINALYSIS COMPLETE WITH MICROSCOPIC (ARMC ONLY)
Bilirubin Urine: NEGATIVE
Glucose, UA: NEGATIVE mg/dL
Hgb urine dipstick: NEGATIVE
Ketones, ur: NEGATIVE mg/dL
Nitrite: POSITIVE — AB
Protein, ur: NEGATIVE mg/dL
Specific Gravity, Urine: 1.006 (ref 1.005–1.030)
pH: 8 (ref 5.0–8.0)

## 2015-02-03 MED ORDER — CEPHALEXIN 500 MG PO CAPS: 500.0000 mg | ORAL_CAPSULE | Freq: Two times a day (BID) | ORAL | Status: DC
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
## 2015-02-03 NOTE — ED Notes (Signed)
Bladder scan resulted in >999cc of fluid.

## 2015-02-03 NOTE — ED Notes (Signed)
Pt presents with family with unable to void x2 days. Recurrent UTIs for past 7 weeks. Has not seen Urology thus far. States pelvic pain 10/10. Finished rx cipro x2 days ago. Hx ALS

## 2015-02-03 NOTE — Discharge Instructions (Signed)
Unfortunately Jennifer [name]'s ALS has progressed to the point that she needs a catheter, at least for now, but it is likely that this may be an ongoing issue.  We recommend that you follow-up either with Dr. Apolinar JunesBrandon, a local urologist at the included contact information, or [facility].  We discussed the risks and benefits of leaving a Foley catheter in place, but [name] is insistent that she have it removed.  It is therefore even more important that he follow-up as soon as possible with a urologist.  Her urinalysis was reassuring in that it does not appear to have an infection at this time, but she did have one abnormal finding (nitrites were positive), so I have written a prescription for Keflex until she can follow-up with her regular doctor or a urologist.  I also sent the urine to lab for culture.  Please follow up at [facility] at the next available opportunity with your neurologist to discuss the advancing ALS symptoms.   Acute Urinary Retention Acute urinary retention is the temporary inability to urinate. This is an uncommon problem in women. It can be caused by:  Infection.  A side effect of a medicine.  A problem in a nearby organ that presses or squeezes on the bladder or the urethra (the tube that drains the bladder).  Psychological problems.   Surgery on your bladder, urethra, or pelvic organs that causes obstruction to the outflow of urine from your bladder. HOME CARE INSTRUCTIONS  If you are sent home with a Foley catheter and a drainage system, you will need to discuss the best course of action with your health care provider. While the catheter is in, maintain a good intake of fluids. Keep the drainage bag emptied and lower than your catheter. This is so that contaminated urine will not flow back into your bladder, which could lead to a urinary tract infection. There are two main types of drainage bags. One is a large bag that usually is used at night. It has a good  capacity that will allow you to sleep through the night without having to empty it. The second type is called a leg bag. It has a smaller capacity so it needs to be emptied more frequently. However, the main advantage is that it can be attached by a leg strap and goes underneath your clothing, allowing you the freedom to move about or leave your home. Only take over-the-counter or prescription medicines for pain, discomfort, or fever as directed by your health care provider.  SEEK MEDICAL CARE IF:  You develop a low-grade fever.  You experience spasms or leakage of urine with the spasms. SEEK IMMEDIATE MEDICAL CARE IF:   You develop chills or fever.  Your catheter stops draining urine.  Your catheter falls out.  You start to develop increased bleeding that does not respond to rest and increased fluid intake. MAKE SURE YOU:  Understand these instructions.  Will watch your condition.  Will get help right away if you are not doing well or get worse. Document Released: 07/12/2006 Document Revised: 05/03/2013 Document Reviewed: 12/22/2012 [facility] Patient Information 2015 [place], [place]. This information is not intended to replace advice given to you by your health care provider. Make sure you discuss any questions you have with your health care provider.

## 2015-02-04 ENCOUNTER — Encounter: Payer: Self-pay | Admitting: Urgent Care

## 2015-02-04 ENCOUNTER — Telehealth: Payer: Self-pay

## 2015-02-04 ENCOUNTER — Emergency Department
Admission: EM | Admit: 2015-02-04 | Discharge: 2015-02-04 | Disposition: A | Discharge status: Home / Self Care | Payer: BLUE CROSS/BLUE SHIELD | Attending: Emergency Medicine | Admitting: Emergency Medicine

## 2015-02-04 DIAGNOSIS — Z79899 Other long term (current) drug therapy: Secondary | ICD-10-CM | POA: Insufficient documentation

## 2015-02-04 DIAGNOSIS — Z87891 Personal history of nicotine dependence: Secondary | ICD-10-CM | POA: Insufficient documentation

## 2015-02-04 DIAGNOSIS — Z79891 Long term (current) use of opiate analgesic: Secondary | ICD-10-CM | POA: Diagnosis not present

## 2015-02-04 DIAGNOSIS — G1221 Amyotrophic lateral sclerosis: Secondary | ICD-10-CM | POA: Diagnosis not present

## 2015-02-04 DIAGNOSIS — Z792 Long term (current) use of antibiotics: Secondary | ICD-10-CM | POA: Insufficient documentation

## 2015-02-04 DIAGNOSIS — R339 Retention of urine, unspecified: Secondary | ICD-10-CM | POA: Diagnosis not present

## 2015-02-04 NOTE — Telephone Encounter (Signed)
PLEASE NOTE: All timestamps contained within this report are represented as [place] Standard Time. CONFIDENTIALTY NOTICE: This fax transmission is intended only for the addressee. It contains information that is legally privileged, confidential or otherwise protected from use or disclosure. If you are not the intended recipient, you are strictly prohibited from reviewing, disclosing, copying using or disseminating any of this information or taking any action in reliance on or regarding this information. If you have received this fax in error, please notify us immediately by telephone so that we can arrange for its return to us. Phone: [phone], Toll-Free: [phone], Fax: [phone] Page: 1 of 2 Call Id: [id number] [facility] Client TELEPHONE ADVICE RECORD [facility] Patient Name: Jennifer [name] Gender: Female DOB: 1960-12-12 Age: [age] Y 3 M 18 D Return Phone Number: [phone] (Primary) Address: City/State/Zip: [place] Client [facility] Physician [place], [place] Contact Type Call Call Type Triage / Clinical Caller Name Cloyd StagersDonald Kube Relationship To Patient Spouse Return Phone Number [phone] (Primary) Chief Complaint URINATE - sudden inability to urinate Initial Comment Caller states wife cannot urinate PreDisposition Go to ED Nurse Assessment Nurse: Phylliss Bobowe, RN, Synetta FailAnita Date/Time (Eastern Time): 02/02/2015 11:19:11 AM Confirm and document reason for call. If symptomatic, describe symptoms. ---Caller states wife cannot urinate caller stated that his wife has not urinated yesterday at about 1000AM Has the patient traveled out of the country within the last 30 days? ---No Does the patient require triage? ---Yes Related visit to physician within the last 2 weeks? ---Yes Does the PT have any chronic conditions? (i.e. diabetes, asthma,  etc.) ---Yes List chronic conditions. ---ALS Did the patient indicate they were pregnant? ---No Guidelines Guideline Title Affirmed Question Affirmed Notes Nurse Date/Time (Eastern Time) Urinary Symptoms [1] Unable to urinate (or only a few drops) > 4 hours AND [2] bladder feels very full (e.g., palpable bladder or strong urge to urinate) Phylliss Bobowe, RN, Synetta FailAnita 02/02/2015 11:20:56 AM Disp. Time Lamount Cohen(Eastern Time) Disposition Final User 02/02/2015 11:16:44 AM Send to Urgent Queue Claudina Lickorton, Lori 02/02/2015 11:22:06 AM Go to ED Now Yes Phylliss Bobowe, RN, Rudell CobbAnita Caller Understands: Yes PLEASE NOTE: All timestamps contained within this report are represented as [place] Standard Time. CONFIDENTIALTY NOTICE: This fax transmission is intended only for the addressee. It contains information that is legally privileged, confidential or otherwise protected from use or disclosure. If you are not the intended recipient, you are strictly prohibited from reviewing, disclosing, copying using or disseminating any of this information or taking any action in reliance on or regarding this information. If you have received this fax in error, please notify us immediately by telephone so that we can arrange for its return to us. Phone: [phone], Toll-Free: [phone], Fax: [phone] Page: 2 of 2 Call Id: [id number] Disagree/Comply: Comply Care Advice Given Per Guideline GO TO ED NOW: You need to be seen in the Emergency Department. Go to the ER at ___________ Hospital. Leave now. Drive carefully. CARE ADVICE given per Urinary Symptoms (Adult) guideline. After Care Instructions Given Call Event Type User Date / Time Description [facility]errals [facility]

## 2015-02-04 NOTE — Discharge Instructions (Signed)
Keep Foley catheter in place to avoid further urinary retention until you're seen by your doctor or urologist. Return to the emergency department if you have fever, pain, or other urgent concerns.  Acute Urinary Retention Acute urinary retention is the temporary inability to urinate. This is an uncommon problem in women. It can be caused by:  Infection.  A side effect of a medicine.  A problem in a nearby organ that presses or squeezes on the bladder or the urethra (the tube that drains the bladder).  Psychological problems.   Surgery on your bladder, urethra, or pelvic organs that causes obstruction to the outflow of urine from your bladder. HOME CARE INSTRUCTIONS  If you are sent home with a Foley catheter and a drainage system, you will need to discuss the best course of action with your health care provider. While the catheter is in, maintain a good intake of fluids. Keep the drainage bag emptied and lower than your catheter. This is so that contaminated urine will not flow back into your bladder, which could lead to a urinary tract infection. There are two main types of drainage bags. One is a large bag that usually is used at night. It has a good capacity that will allow you to sleep through the night without having to empty it. The second type is called a leg bag. It has a smaller capacity so it needs to be emptied more frequently. However, the main advantage is that it can be attached by a leg strap and goes underneath your clothing, allowing you the freedom to move about or leave your home. Only take over-the-counter or prescription medicines for pain, discomfort, or fever as directed by your health care provider.  SEEK MEDICAL CARE IF:  You develop a low-grade fever.  You experience spasms or leakage of urine with the spasms. SEEK IMMEDIATE MEDICAL CARE IF:   You develop chills or fever.  Your catheter stops draining urine.  Your catheter falls out.  You start to develop  increased bleeding that does not respond to rest and increased fluid intake. MAKE SURE YOU:  Understand these instructions.  Will watch your condition.  Will get help right away if you are not doing well or get worse. Document Released: 07/12/2006 Document Revised: 05/03/2013 Document Reviewed: 12/22/2012 [facility] Patient Information 2015 [place] VillageExitCare, [place]LL[place]. This information is not intended to replace advice given to you by your health care provider. Make sure you discuss any questions you have with your health care provider.

## 2015-02-04 NOTE — ED Notes (Signed)
Bladder scan performed: Volume >57159mL.
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
## 2015-02-04 NOTE — Telephone Encounter (Signed)
[facility].

## 2015-02-04 NOTE — ED Notes (Signed)
Patient presents with reports of urinary retention x 2 days - was seen here last night for the same. Family states, "She needs a catheter to go home with."

## 2015-02-05 LAB — URINE CULTURE: Special Requests: NORMAL

## 2015-02-08 ENCOUNTER — Encounter: Payer: Self-pay | Admitting: Urology

## 2015-02-08 ENCOUNTER — Ambulatory Visit (INDEPENDENT_AMBULATORY_CARE_PROVIDER_SITE_OTHER): Payer: Self-pay | Admitting: Urology

## 2015-02-08 VITALS — BP 148/92 | HR 81 | Ht 59.0 in | Wt 118.0 lb

## 2015-02-08 DIAGNOSIS — G1221 Amyotrophic lateral sclerosis: Secondary | ICD-10-CM

## 2015-02-08 DIAGNOSIS — R339 Retention of urine, unspecified: Secondary | ICD-10-CM

## 2015-02-08 DIAGNOSIS — N390 Urinary tract infection, site not specified: Secondary | ICD-10-CM

## 2015-02-08 NOTE — Progress Notes (Signed)
02/08/2015 10:04 AM   Jennifer [name] 1960/12/04 [id number]  Referring provider: [name], MD [address] [place], [place] [address]  Chief Complaint  Patient presents with  . Urinary Retention    New Patient    HPI: 54 year old female with ALS who presents today for further evaluation of urinary retention. Over the past month, she's been seen in the emergency room several times for retention. At the last visit, she had greater than 800 cc of urine in her bladder and she was ultimately discharged with a Foley catheter in place.  She has also been treated for several presumed urinary tract infections, however, review of multiple urine cultures all negative except for on 02/03/2015 which grew 100 K coag-negative staph which is likely either a contaminant or colonization. Denies any fevers, chills, hematuria, or flank pain.  Unclear reasons, she underwent a noncontrast CTscan abd/ pelvis on 01/03/2015 during an admission which was negative other than an incidental duplicated left urinary collecting system.   He is in a wheelchair today accompanied by her husband. She is able to provide her own history however her speech is somewhat difficult to understand at times given her progressive ALS.  PMH: Past Medical History  Diagnosis Date  . ALS (amyotrophic lateral sclerosis)   . Depression   . GERD (gastroesophageal reflux disease)     Surgical History: Past Surgical History  Procedure Laterality Date  . Cesarean section    . Nasal sinus surgery      Home Medications:    Medication List       This list is accurate as of: 02/08/15 11:59 PM.  Always use your most recent med list.               ARIPiprazole 5 MG tablet  Commonly known as:  ABILIFY  Take 1 tablet (5 mg total) by mouth daily.     benzonatate 100 MG capsule  Commonly known as:  TESSALON  Take 1 capsule (100 mg total) by mouth 3 (three) times daily as needed for cough.     buPROPion 150 MG 12 hr  tablet  Commonly known as:  WELLBUTRIN SR  TAKE 1 TABLET (150 MG TOTAL) BY MOUTH 4 (FOUR) TIMES DAILY.     cephALEXin 500 MG capsule  Commonly known as:  KEFLEX  Take 1 capsule (500 mg total) by mouth 2 (two) times daily.     cholecalciferol 1000 UNITS tablet  Commonly known as:  VITAMIN D  Take 1,000 Units by mouth daily.     diazepam 10 MG tablet  Commonly known as:  VALIUM  TAKE 1/2 TO 1 TABLET BY MOUTH EVERY 4 HOURS AS NEEDED     fluticasone 50 MCG/ACT nasal spray  Commonly known as:  FLONASE  USE 2 SPRAYS IN EACH NOSTRIL EVERY DAY AS DIRECTED     ibuprofen 200 MG tablet  Commonly known as:  ADVIL,MOTRIN  Take 800 mg by mouth 3 (three) times daily as needed for mild pain.     lactulose 10 GM/15ML solution  Commonly known as:  CHRONULAC  Take 10 g by mouth daily as needed for mild constipation.     levETIRAcetam 500 MG tablet  Commonly known as:  KEPPRA  Take 500 mg by mouth 2 (two) times daily.     loratadine 10 MG tablet  Commonly known as:  CLARITIN  Take 10 mg by mouth daily as needed for allergies.     NUEDEXTA 20-10 MG Caps  Generic drug:  Dextromethorphan-Quinidine  Take 1 capsule by mouth 2 (two) times daily.     omeprazole 40 MG capsule  Commonly known as:  PRILOSEC  Take 40 mg by mouth daily.     ranitidine 150 MG tablet  Commonly known as:  ZANTAC  Take 150 mg by mouth daily as needed for heartburn.     tizanidine 2 MG capsule  Commonly known as:  ZANAFLEX  Take 2 mg by mouth at bedtime as needed for muscle spasms.        Allergies:  Allergies  Allergen Reactions  . Bee Venom Swelling  . Shellfish Allergy Anaphylaxis  . Lipitor [Atorvastatin] Other (See Comments)    Reaction:  Muscle discomfort   . Oxycodone Other (See Comments)    Reaction:  Muscle stiffness   . Prednisone Other (See Comments)    Reaction:  Psychosis   . Vicodin [Hydrocodone-Acetaminophen] Other (See Comments)    Reaction:  Muscle stiffness   . Hydrocodone     Other  reaction(s): Other STIFFENED HER WHOLE BODY.  [name]. Oxycontin [Oxycodone Hcl] Other (See Comments)    Reaction:  Muscle stiffness   . Peanut-Containing Drug Products Diarrhea    Family History: Family History  Problem Relation Age of Onset  . COPD Mother   . Thyroid disease Mother   . Diverticulosis Mother   . Heart attack Mother   . Gout Father   . Depression Sister   . Thyroid disease Sister   . Cancer Brother     kidney  . Heart attack Maternal Grandmother   . Heart attack Maternal Grandfather   . Heart attack Paternal Grandfather   . Hyperlipidemia Sister   . Evelene CroonWolff Parkinson White syndrome Sister 25  . Depression Brother   . Hypertension Brother     Social History:  reports that she has quit smoking. She has never used smokeless tobacco. She reports that she does not drink alcohol or use illicit drugs.   Review of Systems     Physical Exam: BP 148/92 mmHg  Pulse 81  Ht 4\' 11"  (1.499 m)  Wt 118 lb (53.524 kg)  BMI 23.82 kg/m2  Constitutional:  Alert and oriented, No acute distress.  Wheelchair-bound. Speech slightly garbled questions appropriately. HEENT: [place] AT, moist mucus membranes.  Trachea midline, no masses. Cardiovascular: No clubbing, cyanosis, or edema. Respiratory: Normal respiratory effort, no increased work of breathing. GI: Abdomen is soft, nontender, nondistended, no abdominal masses GU: No CVA tenderness. Foley catheter in place during clear yellow urine with very mild debris. Skin: No rashes, bruises or suspicious lesions. Lymph: No cervical or inguinal adenopathy. Neurologic: Lower extremity paralysis. Limited movement of upper extremities. Psychiatric: Normal mood and affect.  Laboratory Data: Lab Results  Component Value Date   WBC 5.1 01/25/2015   HGB 13.2 01/25/2015   HCT 39.2 01/25/2015   MCV 93.7 01/25/2015   PLT 187 01/25/2015    Lab Results  Component Value Date   CREATININE 0.82 01/25/2015    Urinalysis    Component Value  Date/Time   COLORURINE YELLOW* 02/03/2015 1811   APPEARANCEUR CLEAR* 02/03/2015 1811   LABSPEC 1.006 02/03/2015 1811   PHURINE 8.0 02/03/2015 1811   GLUCOSEU NEGATIVE 02/03/2015 1811   HGBUR NEGATIVE 02/03/2015 1811   BILIRUBINUR NEGATIVE 02/03/2015 1811   BILIRUBINUR neg 08/21/2013 1633   KETONESUR NEGATIVE 02/03/2015 1811   PROTEINUR NEGATIVE 02/03/2015 1811   PROTEINUR trace 08/21/2013 1633   UROBILINOGEN 0.2 08/21/2013 1633   NITRITE POSITIVE*  02/03/2015 1811   NITRITE neg 08/21/2013 1633   LEUKOCYTESUR 1+* 02/03/2015 1811    Pertinent Imaging: CLINICAL DATA: Urinary retention. Urinary tract infection.  EXAM: CT ABDOMEN AND PELVIS WITHOUT CONTRAST  TECHNIQUE: Multidetector CT imaging of the abdomen and pelvis was performed following the standard protocol without IV contrast.  COMPARISON: None.  FINDINGS: Musculoskeletal: No aggressive osseous lesions.  Lung Bases: Atelectasis.  Liver: Unenhanced CT was performed per clinician order. Lack of IV contrast limits sensitivity and specificity, especially for evaluation of abdominal/pelvic solid viscera. Grossly normal.  Spleen: Normal.  Gallbladder: Normal.  Common bile duct: Normal.  Pancreas: Normal.  Adrenal glands: Normal bilaterally.  Kidneys: Partially duplicated LEFT renal collecting system with prominent column a per 10. Tiny E LEFT upper pole area increased density probably representing milk of calcium in a cyst. No ureteral calculi. No hydronephrosis or perinephric inflammatory stranding.  Stomach: Distended with oral contrast.  Small bowel: Normal.  Colon: Large stool burden. Normal appendix. No obstruction or inflammatory changes of colon.  Pelvic Genitourinary: Distended urinary bladder. No inflammatory changes, debris, gas or mural thickening. Uterus and adnexa appear within normal limits.  Peritoneum: No free air or free fluid.  Vascular/lymphatic:  Atherosclerosis without an acute vascular abnormality allowing for noncontrast technique.  Body Wall: RIGHT lower quadrant anterior abdominal wall subcutaneous injection site. Nonspecific stranding lateral to the LEFT hip in the subcutaneous fat.  IMPRESSION: 1. No acute abnormality. 2. Atherosclerosis.   Electronically Signed  By: Andreas Newport M.D.  On: 01/03/2015 14:46  Assessment & Plan:  54 year old female with progressive ALS presenting with urinary retention.  1. ALS (amyotrophic lateral sclerosis) We had a lengthy discussion today with her husband present discussing that urinary retention is an inevitable part of the progression of her disease process. We discussed that the retention typically is irreversible.  2. Urinary retention Various alternatives for bladder management today including chronic indwelling Foley catheter with catheter changes once monthly, placement of a suprapubic catheter which has a slightly decreased risk of urinary tract infection but is a surgical procedure, versus clean intermittent catheterization regularly.  At this in time, she is mostly content with indwelling Foley catheter. She does note that it at times puling and causes some discomfort in her bladder. I have supplied her today with a and additional leg strap to decrease tension.  Also explained that it is quite common to have some occasional leakage around the catheter from bladder spasms but I would avoid upsizing the catheter to avoid additional urethral erosion. Let us know if she's ever interested in a suprapubic tube which could be combined in the future with any other procedure that she may have an operating room. She does have home health and we can arrange for her home health nurse to change her catheter once a month.  3. Urinary tract infection without hematuria, site unspecified We discussed today that it is common to have colonization of bacteria in the urine with a chronic  indwelling catheter. Urinary tract infection should only be treated when symptomatic and such symptoms include fevers, chills, malaise, confusion, or any other significant deviation from her normal. She will let us know if she has any of these symptoms. I have recommended completing the seven-day course of antibiotics which she is has started but no further antibiotics thereafter.   Return if symptoms worsen or fail to improve.  Vanna Scotland, MD  [facility] [address] [place], [place] [address] [phone]

## 2015-02-11 ENCOUNTER — Other Ambulatory Visit: Payer: Self-pay | Admitting: Family Medicine

## 2015-02-14 ENCOUNTER — Telehealth: Payer: Self-pay

## 2015-02-14 NOTE — Telephone Encounter (Signed)
Ok to give verbal order as requested.  You can swim with foley catheter but you have to take certain precautions make sure bacteria does not get introduced.  Prior to swimming, ensure that the catheter balloon is inflated properly and disconnect the drainage bag. A plug is available that fits the end of the tubing which will stop drainage.

## 2015-02-14 NOTE — Telephone Encounter (Signed)
Spoke to Debbie and provided verbal orders 

## 2015-02-14 NOTE — Telephone Encounter (Signed)
Debbie nurse with [facility] left v/m requesting verbal order for home health aide 2 x a week to assist pt and pt has foley catheter and pt wants to know if can go swimming with foley catheter.Please advise.

## 2015-02-16 ENCOUNTER — Other Ambulatory Visit: Payer: Self-pay | Admitting: Family Medicine

## 2015-02-18 ENCOUNTER — Other Ambulatory Visit: Payer: Self-pay

## 2015-02-18 MED ORDER — ARIPIPRAZOLE 5 MG PO TABS: 5.0000 mg | ORAL_TABLET | Freq: Every day | ORAL | Status: DC

## 2015-02-18 NOTE — Telephone Encounter (Signed)
Jennifer [name] left v/m requesting refill abilify to [facility]; pt has spoken previously with Jennifer [name] about refilling abilify but do not see where Jennifer [name] has ever prescribed abilify. Pt has med for today but will be out of med on 02/19/15. Jennifer [name] request cb.

## 2015-02-22 ENCOUNTER — Telehealth: Payer: Self-pay | Admitting: Family Medicine

## 2015-02-22 ENCOUNTER — Ambulatory Visit: Payer: Self-pay | Admitting: Family Medicine

## 2015-02-22 NOTE — Telephone Encounter (Signed)
Left verbal order on VM for Debbie @ [facility] to collect UA and Culture  Also called home number spoke with [facility] and advised I have left message for nurse to collect UA and Culture.

## 2015-02-22 NOTE — Telephone Encounter (Signed)
Debbie contacted office back and states she received order. Asked if culture should be STAT. Advised her that would be up to her, but if pt had kept today's appt more than likely, if like other cultures, it would be sent off and we would await the results for Tx

## 2015-02-22 NOTE — Telephone Encounter (Signed)
[facility] Day - Client TELEPHONE ADVICE RECORD [facility] Patient Name: Jennifer [name] DOB: 02-Jul-1961 Initial Comment Caller states his wife may have a UTI. Pain when she urinates. She has a Cath. Nurse Assessment Nurse: Apolinar Junes, RN, Darl Pikes Date/Time Lamount Cohen Time): 02/22/2015 2:03:24 PM Confirm and document reason for call. If symptomatic, describe symptoms. ---Caller states his wife may have a UTI - Pain when she urinates - She has a catheter states the urine is off and on cloudy and clear - no fever - she is of and on with UTIs - states she was in a few weeks ago - she has ALS - she was seen at urologist about a week ago and was told to stop the Keflex that she was on for preventive measures - states they usually end up in ER on a weekend trying to get this taken care of - - states what he wants is an order for the home health nurse to come and collect sample - they were here yesterday and she was having symptoms and he asked her about collecting a sample then so they do not end up back in ER and she said she couldn't because she didn't have an order - Has the patient traveled out of the country within the last 30 days? ---Not Applicable Does the patient require triage? ---Yes Related visit to physician within the last 2 weeks? ---Yes Does the PT have any chronic conditions? (i.e. diabetes, asthma, etc.) ---Yes List chronic conditions. ---ALS Did the patient indicate they were pregnant? ---No Guidelines Guideline Title Affirmed Question Affirmed Notes Urination Pain - Female Diabetes mellitus or weak immune system (e.g., HIV positive, cancer chemotherapy, transplant patient) Final Disposition User See Physician within 4 Hours (or PCP triage) Apolinar Junes, RN, Darl Pikes Comments caller states they had an appointment for today and she can not make it so he cancelled it - he can not get her to the office today so he does not want an  appointment - he wants medication or for someone to tell him what to do Advised caller that I will send a message to the office and make them aware of the situation and someone from the office will give him a call back Referrals GO TO FACILITY REFUSED GO TO FACILITY OTHER - SPECIFY PLEASE NOTE: All timestamps contained within this report are represented as [place] Standard Time. CONFIDENTIALTY NOTICE: This fax transmission is intended only for the addressee. It contains information that is legally privileged, confidential or otherwise protected from use or disclosure. If you are not the intended recipient, you are strictly prohibited from reviewing, disclosing, copying using or disseminating any of this information or taking any action in reliance on or regarding this information. If you have received this fax in error, please notify us immediately by telephone so that we can arrange for its return to [place]. Phone: [phone], Toll-Free: [phone], Fax: [phone] Page: 2 of 2 Call Id: [id number] Disagree/Comply: Disagree Disagree/Comply Reason: Disagree with instructions

## 2015-02-22 NOTE — Telephone Encounter (Signed)
Ok for home health RN to collect urine for urinalysis and culture

## 2015-02-23 ENCOUNTER — Other Ambulatory Visit: Payer: Self-pay | Admitting: Family Medicine

## 2015-02-25 NOTE — Telephone Encounter (Signed)
Last f/u appt 12/2014 

## 2015-02-25 NOTE — Telephone Encounter (Signed)
Rx called in to requested pharmacy 

## 2015-02-26 ENCOUNTER — Emergency Department
Admission: EM | Admit: 2015-02-26 | Discharge: 2015-02-27 | Disposition: A | Discharge status: Home / Self Care | Payer: BLUE CROSS/BLUE SHIELD | Attending: Emergency Medicine | Admitting: Emergency Medicine

## 2015-02-26 ENCOUNTER — Emergency Department: Payer: BLUE CROSS/BLUE SHIELD

## 2015-02-26 DIAGNOSIS — K5900 Constipation, unspecified: Secondary | ICD-10-CM | POA: Insufficient documentation

## 2015-02-26 DIAGNOSIS — Z87891 Personal history of nicotine dependence: Secondary | ICD-10-CM | POA: Insufficient documentation

## 2015-02-26 DIAGNOSIS — Z7951 Long term (current) use of inhaled steroids: Secondary | ICD-10-CM | POA: Insufficient documentation

## 2015-02-26 DIAGNOSIS — Z79899 Other long term (current) drug therapy: Secondary | ICD-10-CM | POA: Diagnosis not present

## 2015-02-26 LAB — COMPREHENSIVE METABOLIC PANEL
ALBUMIN: 4.6 g/dL (ref 3.5–5.0)
ALK PHOS: 41 U/L (ref 38–126)
ALT: 12 U/L — ABNORMAL LOW (ref 14–54)
ANION GAP: 9 (ref 5–15)
AST: 15 U/L (ref 15–41)
BUN: 12 mg/dL (ref 6–20)
CO2: 28 mmol/L (ref 22–32)
CREATININE: 0.72 mg/dL (ref 0.44–1.00)
Calcium: 9.9 mg/dL (ref 8.9–10.3)
Chloride: 105 mmol/L (ref 101–111)
Glucose, Bld: 108 mg/dL — ABNORMAL HIGH (ref 65–99)
Potassium: 3.6 mmol/L (ref 3.5–5.1)
SODIUM: 142 mmol/L (ref 135–145)
Total Bilirubin: 0.3 mg/dL (ref 0.3–1.2)
Total Protein: 7.8 g/dL (ref 6.5–8.1)

## 2015-02-26 LAB — CBC
HCT: 41 % (ref 35.0–47.0)
HEMOGLOBIN: 14 g/dL (ref 12.0–16.0)
MCH: 31.6 pg (ref 26.0–34.0)
MCHC: 34.1 g/dL (ref 32.0–36.0)
MCV: 92.6 fL (ref 80.0–100.0)
PLATELETS: 242 10*3/uL (ref 150–440)
RBC: 4.42 MIL/uL (ref 3.80–5.20)
RDW: 12.2 % (ref 11.5–14.5)
WBC: 9 10*3/uL (ref 3.6–11.0)

## 2015-02-26 LAB — LIPASE, BLOOD: LIPASE: 36 U/L (ref 22–51)

## 2015-02-26 NOTE — ED Notes (Addendum)
Pt to triage vai w/c with no distress noted; family st last BM 9 days ago, lower abd pain; no N/V; lactulose and 2 suppositories at home without relief

## 2015-02-27 ENCOUNTER — Encounter: Payer: Self-pay | Admitting: *Deleted

## 2015-02-27 MED ORDER — DOCUSATE SODIUM 100 MG PO CAPS
200.0000 mg | ORAL_CAPSULE | Freq: Once | ORAL | Status: AC
  Administered 2015-02-27: 200 mg via ORAL
  Filled 2015-02-27: qty 2

## 2015-02-27 MED ORDER — MAGNESIUM CITRATE PO SOLN
0.5000 | Freq: Once | ORAL | Status: AC
  Administered 2015-02-27: 0.5 via ORAL
  Filled 2015-02-27: qty 296

## 2015-02-27 MED ORDER — LACTULOSE 10 GM/15ML PO SOLN: 10.0000 g | Freq: Three times a day (TID) | ORAL | Status: DC
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
## 2015-02-27 NOTE — ED Notes (Signed)
Patient with no complaints at this time. Respirations even and unlabored. Skin warm/dry. Discharge instructions reviewed with patient at this time. Patient given opportunity to voice concerns/ask questions. Patient discharged at this time and left Emergency Department, via wheelchair.   

## 2015-02-27 NOTE — Discharge Instructions (Signed)

## 2015-02-27 NOTE — ED Notes (Signed)
Pt w/ a very small amount of soft stool upon removing pt from bedpan, pt admits that she feels better at this time. Dr. Zenda Alpers made aware.

## 2015-03-02 ENCOUNTER — Other Ambulatory Visit: Payer: Self-pay | Admitting: Family Medicine

## 2015-03-04 ENCOUNTER — Ambulatory Visit: Payer: Self-pay

## 2015-03-04 ENCOUNTER — Encounter: Payer: Self-pay | Admitting: Family Medicine

## 2015-03-04 VITALS — BP 145/89 | HR 89 | Ht 59.0 in

## 2015-03-04 DIAGNOSIS — R339 Retention of urine, unspecified: Secondary | ICD-10-CM

## 2015-03-05 ENCOUNTER — Ambulatory Visit: Payer: BLUE CROSS/BLUE SHIELD | Admitting: Psychiatry

## 2015-03-05 NOTE — Progress Notes (Signed)
  Order: [facility]  Pt needs 16 french foley cath changed once a month with the first catheter change beginning Thursday, September 8th. This order is for Insertion, Removal and Care of an Indwelling Foley catheter change.

## 2015-03-05 NOTE — Progress Notes (Signed)
Cath Change/ Replacement  Patient is present today for a catheter change due to urinary retention.  10 ml of water was removed from the balloon, a 16 FR foley cath was removed with out difficulty.  Patient was cleaned and prepped in a sterile fashion with betadine and 2% lidocaine jelly was instilled into the urethra. A 16 FR foley cath was replaced into the bladder complications were noted as: pt have ALS, unable to bend her legs.  Urine return was noted 20 ml and urine was light yellow in color. The balloon was filled with 10ml of sterile water. A leg bag was attached for drainage.  A night bag was also given to the patient and patient was given instruction on how to change from one bag to another. Patient was given proper instruction on catheter care.    Preformed by: Theotis Burrow

## 2015-03-06 ENCOUNTER — Telehealth: Payer: Self-pay | Admitting: Family Medicine

## 2015-03-06 ENCOUNTER — Telehealth: Payer: Self-pay

## 2015-03-06 NOTE — Telephone Encounter (Signed)
Is this request for a verbal order?

## 2015-03-06 NOTE — Telephone Encounter (Signed)
This can wait until Dr. Dayton Martes return. This is a very complicated case.

## 2015-03-06 NOTE — Telephone Encounter (Signed)
Jennifer [name] with [facility] requesting a re cert for home health aid and nursing  cb number [phone]

## 2015-03-06 NOTE — Telephone Encounter (Signed)
Jennifer [name] left v/m previously discussed with Dr Dayton Martes about pt receiving 24 hour care; pt needs someone with her all the time. Jennifer [name] understands ins has previously denied 24 hour care but Jennifer [name] request to revisit request with ins. For 24 hour care. Jennifer [name] request cb. Dr Dayton Martes out of office and off line. Please advise.

## 2015-03-06 NOTE — Telephone Encounter (Signed)
Ok to continue home health.

## 2015-03-06 NOTE — Telephone Encounter (Signed)
Lm on Debbie's vm and provided verbal order. Advised to contact office back should she have any additional questions/needs.

## 2015-03-06 NOTE — Telephone Encounter (Signed)
Yes, a request to continue home health.

## 2015-03-15 ENCOUNTER — Other Ambulatory Visit: Payer: Self-pay | Admitting: Family Medicine

## 2015-03-15 NOTE — Telephone Encounter (Signed)
2nd request for medication;original Rx denied as last Rx was 02/25/2015. Spoke to Dr Dayton Martes via phone who changed sig from 1/2-1 tab q6h prn to 1 tab q6h. Rx called in to pharmacy with new instruction.

## 2015-03-19 ENCOUNTER — Encounter: Payer: Self-pay | Admitting: Urgent Care

## 2015-03-19 ENCOUNTER — Emergency Department
Admission: EM | Admit: 2015-03-19 | Discharge: 2015-03-19 | Disposition: A | Discharge status: Home / Self Care | Payer: BLUE CROSS/BLUE SHIELD | Attending: Emergency Medicine | Admitting: Emergency Medicine

## 2015-03-19 ENCOUNTER — Telehealth: Payer: Self-pay | Admitting: Urology

## 2015-03-19 DIAGNOSIS — Z791 Long term (current) use of non-steroidal anti-inflammatories (NSAID): Secondary | ICD-10-CM | POA: Diagnosis not present

## 2015-03-19 DIAGNOSIS — Z7951 Long term (current) use of inhaled steroids: Secondary | ICD-10-CM | POA: Insufficient documentation

## 2015-03-19 DIAGNOSIS — Y846 Urinary catheterization as the cause of abnormal reaction of the patient, or of later complication, without mention of misadventure at the time of the procedure: Secondary | ICD-10-CM | POA: Insufficient documentation

## 2015-03-19 DIAGNOSIS — T839XXA Unspecified complication of genitourinary prosthetic device, implant and graft, initial encounter: Secondary | ICD-10-CM

## 2015-03-19 DIAGNOSIS — Z87891 Personal history of nicotine dependence: Secondary | ICD-10-CM | POA: Insufficient documentation

## 2015-03-19 DIAGNOSIS — Z79899 Other long term (current) drug therapy: Secondary | ICD-10-CM | POA: Diagnosis not present

## 2015-03-19 DIAGNOSIS — T83018A Breakdown (mechanical) of other indwelling urethral catheter, initial encounter: Secondary | ICD-10-CM | POA: Diagnosis not present

## 2015-03-19 DIAGNOSIS — T83011A Breakdown (mechanical) of indwelling urethral catheter, initial encounter: Secondary | ICD-10-CM

## 2015-03-19 LAB — URINALYSIS COMPLETE WITH MICROSCOPIC (ARMC ONLY)
BILIRUBIN URINE: NEGATIVE
GLUCOSE, UA: NEGATIVE mg/dL
NITRITE: NEGATIVE
Protein, ur: >500 mg/dL — AB
Specific Gravity, Urine: 1.017 (ref 1.005–1.030)
Squamous Epithelial / LPF: NONE SEEN
pH: 7 (ref 5.0–8.0)

## 2015-03-19 MED ORDER — CEPHALEXIN 500 MG PO CAPS: 500.0000 mg | ORAL_CAPSULE | Freq: Two times a day (BID) | ORAL | Status: AC

## 2015-03-19 MED ORDER — CEPHALEXIN 500 MG PO CAPS
500.0000 mg | ORAL_CAPSULE | Freq: Two times a day (BID) | ORAL | Status: DC
  Administered 2015-03-19: 500 mg via ORAL
  Filled 2015-03-19: qty 1
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
## 2015-03-19 NOTE — ED Notes (Signed)
Foley catheter in place. Husband states it is "leaking everywhere". No distress noted.

## 2015-03-19 NOTE — ED Notes (Addendum)
Patient presents with leaking foley catheter. Cath was checked earlier today by "the nurse" and patient advised that it was ok. Visitor upset that patient having to go back to the lobby. States, "You would think that with you being a regular that you wouldn't have to wait like other people. I guess being a regular doesn't matter."

## 2015-03-19 NOTE — Telephone Encounter (Signed)
[facility] Nurse called stating pt was having irritation, itching and burning.  She is currently using Vagisil.  She was wondering if pt needs to come in for appt.  Please call Jennifer [name] [phone]

## 2015-03-19 NOTE — Telephone Encounter (Signed)
She may have a yeast infection or vaginal atrophy.  She will need an office visit to evaluate for these conditions.

## 2015-03-20 NOTE — Telephone Encounter (Signed)
Spoke to [place] at [facility].  I advised her that Carollee Herter recommends an office visit to evaluate her symptoms.    [facility] will contact the patient's family and have them contact the office to schedule the appointment

## 2015-03-23 ENCOUNTER — Other Ambulatory Visit: Payer: Self-pay | Admitting: Family Medicine

## 2015-03-24 LAB — URINE CULTURE: Culture: 50000

## 2015-03-24 NOTE — ED Notes (Signed)
Critical Result to Dr. Lenard Lance:  Patient to return to ED if worsening symptoms or N/V, Fever. Otherwise, return to Urology 03/25/15 in AM.   Family contacted, spoke with Cloyd Stagers (Husband).

## 2015-03-24 NOTE — ED Notes (Signed)
Spoke with Cloyd Stagers (Husband).  Per orders of Dr. Lenard Lance: If patient is having urinary symptoms, fever, N/V to return to the ED immediately. Otherwise follow up with urology in the morning. '

## 2015-03-25 ENCOUNTER — Other Ambulatory Visit: Payer: Self-pay | Admitting: Family Medicine

## 2015-03-25 ENCOUNTER — Telehealth: Payer: Self-pay

## 2015-03-25 NOTE — Progress Notes (Signed)
ED culture follow up: Called pt regarding change in antibiotic therapy based on urine culture on 8/23, informed to take new antibiotic (linezolid) and to stop taking old one (cephalexin).  Rx for linezolid 600 mg PO BID x7 days called into [facility] on [address] in [place] (pt's preferred pharmacy) on 8/29 at 1803 on behalf of Dr. Lacretia Nicks.

## 2015-03-25 NOTE — Telephone Encounter (Signed)
Unable to leave a message to remind patient about being due for a Mammogram.

## 2015-03-28 ENCOUNTER — Ambulatory Visit: Payer: BLUE CROSS/BLUE SHIELD | Admitting: Psychiatry

## 2015-04-06 ENCOUNTER — Other Ambulatory Visit: Payer: Self-pay | Admitting: Family Medicine

## 2015-04-08 ENCOUNTER — Other Ambulatory Visit: Payer: Self-pay

## 2015-04-08 MED ORDER — DIAZEPAM 10 MG PO TABS: 10.0000 mg | ORAL_TABLET | Freq: Four times a day (QID) | ORAL | Status: DC | PRN

## 2015-04-08 NOTE — Telephone Encounter (Signed)
Called into [facility] [address] 

## 2015-04-08 NOTE — Telephone Encounter (Signed)
I received an electronic refill request for Valium. Last refilled on 03/15/15 for #60 with 0 refills. Ok to refill this medication?

## 2015-04-08 NOTE — Telephone Encounter (Signed)
Rx called in as directed.   

## 2015-04-08 NOTE — Telephone Encounter (Signed)
Last office visit 01/16/2015.  Last refilled 03/15/2015 for #60 with no refills.  Ok to refill?

## 2015-04-10 ENCOUNTER — Telehealth: Payer: Self-pay | Admitting: *Deleted

## 2015-04-10 NOTE — Telephone Encounter (Signed)
Phone call from [place] at [facility].  Patient is complaining of dysuria.  A nurse is on her way to the home to evaluate.  Verbal orders needed for UA and culture/sensitivities.  Per Dr. Dayton Martes, verbal orders given.

## 2015-04-13 ENCOUNTER — Other Ambulatory Visit: Payer: Self-pay | Admitting: Family Medicine

## 2015-04-15 ENCOUNTER — Encounter: Payer: Self-pay | Admitting: Family Medicine

## 2015-04-17 ENCOUNTER — Ambulatory Visit: Payer: Self-pay | Admitting: Obstetrics and Gynecology

## 2015-04-26 ENCOUNTER — Other Ambulatory Visit: Payer: Self-pay | Admitting: Family Medicine

## 2015-05-02 ENCOUNTER — Ambulatory Visit: Payer: Self-pay | Admitting: Obstetrics and Gynecology

## 2015-05-02 ENCOUNTER — Telehealth: Payer: Self-pay

## 2015-05-02 NOTE — Telephone Encounter (Signed)
Ok to give verbal orders as requested. 

## 2015-05-02 NOTE — Telephone Encounter (Signed)
Spoke to [place] and provided verbal orders as requested

## 2015-05-02 NOTE — Telephone Encounter (Signed)
Debbie nurse with [facility] left v/m requesting verbal orders for recertification home health nursing for 9 more weeks. Also request recert home health aide 2 x a week for 9 weeks for bathing and help with ADLs.

## 2015-05-02 NOTE — Telephone Encounter (Signed)
Jennifer [name] called back; pt was changing foley catheter and urine does not look right; urine is thick and cloudy; request order to do u/a and C/S.Please advise.

## 2015-05-06 ENCOUNTER — Other Ambulatory Visit: Payer: Self-pay | Admitting: Family Medicine

## 2015-05-09 ENCOUNTER — Other Ambulatory Visit: Payer: Self-pay | Admitting: Family Medicine

## 2015-05-09 ENCOUNTER — Encounter: Payer: Self-pay | Admitting: Family Medicine

## 2015-05-09 DIAGNOSIS — F329 Major depressive disorder, single episode, unspecified: Secondary | ICD-10-CM | POA: Diagnosis not present

## 2015-05-09 DIAGNOSIS — Z87440 Personal history of urinary (tract) infections: Secondary | ICD-10-CM | POA: Diagnosis not present

## 2015-05-09 DIAGNOSIS — Z87891 Personal history of nicotine dependence: Secondary | ICD-10-CM | POA: Diagnosis not present

## 2015-05-09 DIAGNOSIS — G1221 Amyotrophic lateral sclerosis: Secondary | ICD-10-CM | POA: Diagnosis not present

## 2015-05-10 ENCOUNTER — Emergency Department
Admission: EM | Admit: 2015-05-10 | Discharge: 2015-05-10 | Disposition: A | Discharge status: Home / Self Care | Payer: BLUE CROSS/BLUE SHIELD | Attending: Emergency Medicine | Admitting: Emergency Medicine

## 2015-05-10 ENCOUNTER — Encounter: Payer: Self-pay | Admitting: *Deleted

## 2015-05-10 DIAGNOSIS — Z87891 Personal history of nicotine dependence: Secondary | ICD-10-CM | POA: Insufficient documentation

## 2015-05-10 DIAGNOSIS — F332 Major depressive disorder, recurrent severe without psychotic features: Secondary | ICD-10-CM | POA: Insufficient documentation

## 2015-05-10 DIAGNOSIS — N390 Urinary tract infection, site not specified: Secondary | ICD-10-CM | POA: Insufficient documentation

## 2015-05-10 DIAGNOSIS — R1030 Lower abdominal pain, unspecified: Secondary | ICD-10-CM | POA: Diagnosis present

## 2015-05-10 DIAGNOSIS — Z1621 Resistance to vancomycin: Secondary | ICD-10-CM | POA: Insufficient documentation

## 2015-05-10 DIAGNOSIS — Z79899 Other long term (current) drug therapy: Secondary | ICD-10-CM | POA: Insufficient documentation

## 2015-05-10 DIAGNOSIS — B952 Enterococcus as the cause of diseases classified elsewhere: Secondary | ICD-10-CM | POA: Insufficient documentation

## 2015-05-10 DIAGNOSIS — A491 Streptococcal infection, unspecified site: Secondary | ICD-10-CM

## 2015-05-10 LAB — URINALYSIS COMPLETE WITH MICROSCOPIC (ARMC ONLY)
Bilirubin Urine: NEGATIVE
GLUCOSE, UA: NEGATIVE mg/dL
NITRITE: NEGATIVE
Protein, ur: >500 mg/dL — AB
SPECIFIC GRAVITY, URINE: 1.019 (ref 1.005–1.030)
pH: 7 (ref 5.0–8.0)

## 2015-05-10 LAB — CBC
HEMATOCRIT: 38.4 % (ref 35.0–47.0)
Hemoglobin: 13.2 g/dL (ref 12.0–16.0)
MCH: 32.7 pg (ref 26.0–34.0)
MCHC: 34.4 g/dL (ref 32.0–36.0)
MCV: 95 fL (ref 80.0–100.0)
Platelets: 237 10*3/uL (ref 150–440)
RBC: 4.04 MIL/uL (ref 3.80–5.20)
RDW: 15.5 % — AB (ref 11.5–14.5)
WBC: 5.8 10*3/uL (ref 3.6–11.0)

## 2015-05-10 LAB — BASIC METABOLIC PANEL
ANION GAP: 10 (ref 5–15)
BUN: 10 mg/dL (ref 6–20)
CHLORIDE: 100 mmol/L — AB (ref 101–111)
CO2: 28 mmol/L (ref 22–32)
Calcium: 9.6 mg/dL (ref 8.9–10.3)
Creatinine, Ser: 0.58 mg/dL (ref 0.44–1.00)
GFR calc non Af Amer: >60 mL/min (ref >60)
Glucose, Bld: 98 mg/dL (ref 65–99)
Potassium: 2.8 mmol/L — CL (ref 3.5–5.1)
Sodium: 138 mmol/L (ref 135–145)

## 2015-05-10 MED ORDER — FOSFOMYCIN TROMETHAMINE 3 G PO PACK: 3.0000 g | PACK | Freq: Once | ORAL | Status: DC

## 2015-05-10 MED ORDER — POTASSIUM CHLORIDE CRYS ER 20 MEQ PO TBCR: 20.0000 meq | EXTENDED_RELEASE_TABLET | Freq: Once | ORAL | Status: DC

## 2015-05-10 MED ORDER — FOSFOMYCIN TROMETHAMINE 3 G PO PACK
3.0000 g | PACK | Freq: Once | ORAL | Status: AC
  Administered 2015-05-10: 3 g via ORAL
  Filled 2015-05-10: qty 3

## 2015-05-10 MED ORDER — POTASSIUM CHLORIDE CRYS ER 20 MEQ PO TBCR
20.0000 meq | EXTENDED_RELEASE_TABLET | Freq: Once | ORAL | Status: AC
  Administered 2015-05-10: 20 meq via ORAL
  Filled 2015-05-10: qty 1

## 2015-05-10 NOTE — Discharge Instructions (Signed)
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 This bacteria can be resistant to a number of antibiotics. It is often susceptible to fosfomysin. Take this medicine on Sunday and Tuesday (it is taken every other day for 3 doses total). This was discussed with infectious disease (Dr. Sampson Goon) follow-up with your regular doctor, Dr. Dayton Martes. Return to the emergency department if you have fever, worsening pain, or other urgent concerns.  Vancomycin-Resistant Enterococcus FAQs What is VRE? VRE refers to vancomycin-resistant enterococcus. The enterococcus is a germ or bacterium that lives in the intestinal tract and in the female genital tract. Most of the time, the enterococcus does not cause a problem. This is called colonization. Occasionally, the enterococcus can cause an infection of the urinary tract, bloodstream, or skin wounds. Vancomycin is an antibiotic that can be used to treat those infections. However, some enterococcus germs are no longer killed by vancomycin and are known as vancomycin-resistant enterococcus or VRE. These germs are often resistant to many antibiotics in addition to vancomycin. Are certain people at risk of getting a VRE infection? The following people are at an increased risk of becoming infected with VRE:  People who have been previously treated with the antibiotic vancomycin or other antibiotics for long periods of time.  People who are hospitalized, especially when they receive antibiotics for long periods of time.  People with weakened immune systems such as patients in intensive care units, or in cancer or transplant wards.  People who have undergone surgical procedures, such as abdominal or chest surgery.  People with medical devices such as urinary catheters or intravenous (IV) catheters that stay in for some time.  People who are colonized with VRE. Can VRE infections be treated? Most VRE infections can be treated with antibiotics other than vancomycin. Laboratory  testing can help healthcare providers determine which antibiotics will work.  How is VRE spread? VRE can be passed from person to person. VRE can get onto hands when they touch a person with VRE or after contact with contaminated surfaces or equipment.  How can the spread of VRE be prevented in the hospital? In the hospital:  Caregivers should clean their hands with soap and water or an alcohol-based hand rub before and after caring for every patient.  Hospital rooms and medical equipment should be cleaned and disinfected after use.  In some cases, your healthcare provider may also use gowns and gloves to help prevent spread  Your healthcare providers should take special measures (isolation precautions) to prevent the spread of your infection to others. How can I prevent the spread of VRE? If you or someone in your household has VRE, the following can be done to prevent the spread of VRE:  Keep your hands clean, especially after using the bathroom or before preparing food. Use soap and water or use alcohol-based hand rubs.  Frequently clean areas of your home, such as your bathroom, that may become contaminated with VRE.  Wear gloves if you might come in contact with body fluids (such as stool or bandages from infected wounds) that could contain VRE. Always wash your hands after removing gloves.  If you have VRE, be sure to tell the healthcare provider caring for you. Healthcare facilities use special precautions to help prevent the spread of VRE to others. Where can I get more information about VRE? More information about VRE is available from the Centers for Disease Control and Prevention (CDC) at: [employer] Developed and co-sponsored by [employer] for Healthcare Epidemiology of [place] (  SHEA); Infectious Diseases Society of America (IDSA); [facility]; Association for Professionals in Infection Control and Epidemiology (APIC); Centers  for Disease Control and Prevention (CDC); and [employer].   This information is not intended to replace advice given to you by your health care provider. Make sure you discuss any questions you have with your health care provider.   Document Released: 05/10/2007 Document Revised: 08/03/2014 Document Reviewed: 09/26/2014 Elsevier Interactive Patient Education [employer]2016 Elsevier Inc.
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
## 2015-05-10 NOTE — ED Notes (Signed)
Confirmed UTI from home health-sent for IV antibiotics.  Pt has foley catheter in place.  No fevers.

## 2015-05-10 NOTE — ED Notes (Signed)
Pt states she has a UTI, per family in the room pt has had cath in place for about 3 weeks now. There appears to be a lot of sediment in bag and tubing,

## 2015-05-14 ENCOUNTER — Telehealth: Payer: Self-pay | Admitting: Family Medicine

## 2015-05-14 LAB — URINE CULTURE

## 2015-05-14 NOTE — Telephone Encounter (Signed)
Please advise 

## 2015-05-14 NOTE — Telephone Encounter (Signed)
Lactulose is quite strong.  How much is she taking?  Has she contacted her ALS doctor about this?

## 2015-05-14 NOTE — Telephone Encounter (Signed)
Jennifer [name] w/[facility] wanted the doctor to know that the patient has not had a bowel movement in over a week and she already takes Lactulose.  This is an ongoing problem and she would like to know if there is something else the doctor can add to the Lactulose that can be taken daily.

## 2015-05-15 NOTE — Telephone Encounter (Signed)
Lm on Debbie's vm and advised per Dr Dayton MartesAron

## 2015-05-15 NOTE — Telephone Encounter (Signed)
Please call her ALS doctor.  We need his input.

## 2015-05-15 NOTE — Telephone Encounter (Addendum)
Spoke to BartlettDebbie who has not spoken to pts ALS dr. She states she was not in front of pts chart, so you may have to refer to her medication list for dosing.

## 2015-05-16 ENCOUNTER — Ambulatory Visit: Payer: Self-pay | Admitting: Family Medicine

## 2015-05-16 DIAGNOSIS — N390 Urinary tract infection, site not specified: Secondary | ICD-10-CM | POA: Insufficient documentation

## 2015-05-16 DIAGNOSIS — E876 Hypokalemia: Secondary | ICD-10-CM | POA: Insufficient documentation

## 2015-05-18 ENCOUNTER — Other Ambulatory Visit: Payer: Self-pay | Admitting: Family Medicine

## 2015-05-20 ENCOUNTER — Ambulatory Visit: Payer: Self-pay | Admitting: Family Medicine

## 2015-05-20 NOTE — Telephone Encounter (Signed)
Pt has not had any recent f/u outside of hospital f/u

## 2015-05-20 NOTE — Telephone Encounter (Signed)
Rx called in to requested pharmacy 

## 2015-05-24 ENCOUNTER — Encounter: Payer: Self-pay | Admitting: *Deleted

## 2015-05-26 ENCOUNTER — Other Ambulatory Visit: Payer: Self-pay | Admitting: Family Medicine

## 2015-05-27 ENCOUNTER — Encounter: Payer: Self-pay | Admitting: Urology

## 2015-05-27 ENCOUNTER — Ambulatory Visit (INDEPENDENT_AMBULATORY_CARE_PROVIDER_SITE_OTHER): Payer: BLUE CROSS/BLUE SHIELD | Admitting: Urology

## 2015-05-27 VITALS — BP 148/91 | HR 86

## 2015-05-27 DIAGNOSIS — R32 Unspecified urinary incontinence: Secondary | ICD-10-CM

## 2015-05-27 NOTE — Progress Notes (Signed)
05/27/2015 8:34 AM   Jennifer [name] 04/20/1961 [id number]  Referring provider: [name], MD [address] [place], [place] [address]  Chief Complaint  Patient presents with  . Urinary Retention    pt would like to discuss possible getting a suprapubic catheter placed     HPI: Patient is a 54 year old white female with ALS who presents today requesting suprapubic tube placement.  She is difficult to understand, but her husband assisted in obtaining the history.   She is had a Foley catheter for three months and is starting to experience urethral irritation.   Her ALS doctor has suggested a SPT placement.  She is also having recurrent urinary tract infections and she would like a suprapubic tube placed for her incontinence management.  She was seen in the emergency room recently and treated for a highly resistant UTI.  Her symptoms when she has UTI consists of suprapubic pain and burning.  She is asymptomatic at this time.  She denies any current fevers, chills, nausea or vomiting.  She also denies any dysuria, gross hematuria or suprapubic pain.     PMH: Past Medical History  Diagnosis Date  . ALS (amyotrophic lateral sclerosis) (HCC)   . Depression   . GERD (gastroesophageal reflux disease)   . Urinary retention   . Dysphagia, oropharyngeal 11/28/2013  . CN (constipation) 11/28/2013    Surgical History: Past Surgical History  Procedure Laterality Date  . Cesarean section    . Nasal sinus surgery      Home Medications:    Medication List       This list is accurate as of: 05/27/15 11:59 PM.  Always use your most recent med list.               ARIPiprazole 5 MG tablet  Commonly known as:  ABILIFY  TAKE 1 TABLET (5 MG TOTAL) BY MOUTH DAILY.     atropine 1 % ophthalmic solution  Apply 2 drops onto tongue every 4 hours as needed for drooling     benzonatate 100 MG capsule  Commonly known as:  TESSALON  Take 1 capsule (100 mg total) by mouth 3 (three)  times daily as needed for cough.     buPROPion 150 MG 12 hr tablet  Commonly known as:  WELLBUTRIN SR  TAKE 1 TABLET BY MOUTH 4 TIMES DAILY.     cholecalciferol 1000 UNITS tablet  Commonly known as:  VITAMIN D  Take 1,000 Units by mouth daily.     diazepam 10 MG tablet  Commonly known as:  VALIUM  Take 1 tablet (10 mg total) by mouth every 6 (six) hours as needed for anxiety.     diazepam 10 MG tablet  Commonly known as:  VALIUM  TAKE 1 TABLET BY MOUTH EVERY 6 HOURS AS NEEDED     divalproex 250 MG DR tablet  Commonly known as:  DEPAKOTE  TAKE 1 TABLET BY MOUTH EVERY 12 HOURS.     fluticasone 50 MCG/ACT nasal spray  Commonly known as:  FLONASE  USE 2 SPRAYS IN EACH NOSTRIL EVERY DAY AS DIRECTED     fosfomycin 3 G Pack  Commonly known as:  MONUROL  Take 3 g by mouth once. Take 3 grams every other day for two more doses (Sunday, then Tuesday).     ibuprofen 200 MG tablet  Commonly known as:  ADVIL,MOTRIN  Take 800 mg by mouth 3 (three) times daily as needed for mild pain.  lactulose 10 GM/15ML solution  Commonly known as:  CHRONULAC  Take 10 g by mouth daily as needed for mild constipation.     lactulose 10 GM/15ML solution  Commonly known as:  CHRONULAC  Take 15 mLs (10 g total) by mouth 3 (three) times daily.     levETIRAcetam 500 MG tablet  Commonly known as:  KEPPRA  Take 500 mg by mouth 2 (two) times daily.     linezolid 600 MG tablet  Commonly known as:  ZYVOX  Take 600 mg by mouth 2 (two) times daily.     loratadine 10 MG tablet  Commonly known as:  CLARITIN  Take 10 mg by mouth daily as needed for allergies.     NUEDEXTA 20-10 MG Caps  Generic drug:  Dextromethorphan-Quinidine  Take 1 capsule by mouth 2 (two) times daily.     omeprazole 40 MG capsule  Commonly known as:  PRILOSEC  Take 40 mg by mouth daily.     potassium chloride 20 MEQ/15ML (10%) Soln     potassium chloride SA 20 MEQ tablet  Commonly known as:  K-DUR,KLOR-CON  Take 1 tablet  (20 mEq total) by mouth once.     ranitidine 150 MG tablet  Commonly known as:  ZANTAC  Take 150 mg by mouth daily as needed for heartburn.     tizanidine 2 MG capsule  Commonly known as:  ZANAFLEX  Take 2 mg by mouth at bedtime as needed for muscle spasms.        Allergies:  Allergies  Allergen Reactions  . Bee Pollen Swelling  . Bee Venom Swelling  . Hydrocodone     Other reaction(s): Muscle Pain, Other (See Comments) STIFFENED HER WHOLE BODY. Whole body tightens, cannot move at all (All Codones) Other reaction(s): Other STIFFENED HER WHOLE BODY.  . Lipitor [Atorvastatin] Other (See Comments)    Other reaction(s): Other (See Comments) Other Reaction: SEVERE MUSCLE CRAMPS Reaction:  Muscle discomfort   . Shellfish Allergy Anaphylaxis  . Oxycodone Other (See Comments)    Other reaction(s): Other (See Comments) Reaction:Muscle stiffness  Reaction:Muscle stiffness  Reaction:  Muscle stiffness   . Prednisone Other (See Comments)    Other reaction(s): Other (See Comments) Reaction:  Psychosis  Reaction:  Psychosis   . Vicodin [Hydrocodone-Acetaminophen] Other (See Comments)    Other reaction(s): Other (See Comments) Reaction:  Muscle stiffness  Reaction:  Muscle stiffness   . Oxycontin [Oxycodone Hcl] Other (See Comments)    Reaction:  Muscle stiffness   . Peanut Oil Diarrhea  . Peanut-Containing Drug Products Diarrhea    Family History: Family History  Problem Relation Age of Onset  . COPD Mother   . Thyroid disease Mother   . Diverticulosis Mother   . Heart attack Mother   . Gout Father   . Depression Sister   . Thyroid disease Sister   . Cancer Brother     kidney  . Heart attack Maternal Grandmother   . Heart attack Maternal Grandfather   . Heart attack Paternal Grandfather   . Hyperlipidemia Sister   . Evelene CroonWolff Parkinson White syndrome Sister 25  . Depression Brother   . Hypertension Brother     Social History:  reports that she has quit smoking.  She has never used smokeless tobacco. She reports that she does not drink alcohol or use illicit drugs.  ROS: UROLOGY Frequent Urination?: No Hard to postpone urination?: No Burning/pain with urination?: No Get up at night to urinate?: No Leakage of  urine?: No Urine stream starts and stops?: No Trouble starting stream?: No Do you have to strain to urinate?: No Blood in urine?: No Urinary tract infection?: No Sexually transmitted disease?: No Injury to kidneys or bladder?: No Painful intercourse?: No Weak stream?: No Currently pregnant?: No Vaginal bleeding?: No Last menstrual period?: No  Gastrointestinal Nausea?: No Vomiting?: No Indigestion/heartburn?: No Diarrhea?: No Constipation?: No  Constitutional Fever: No Night sweats?: No Weight loss?: No Fatigue?: No  Skin Skin rash/lesions?: No Itching?: No  Eyes Blurred vision?: No Double vision?: No  Ears/Nose/Throat Sore throat?: No Sinus problems?: No  Hematologic/Lymphatic Swollen glands?: No Easy bruising?: No  Cardiovascular Leg swelling?: No Chest pain?: No  Respiratory Cough?: No Shortness of breath?: No  Endocrine Excessive thirst?: No  Musculoskeletal Back pain?: No Joint pain?: No  Neurological Headaches?: No Dizziness?: No  Psychologic Depression?: No Anxiety?: No  Physical Exam: BP 148/91 mmHg  Pulse 86  Wt   Constitutional: Well nourished. Alert and oriented, No acute distress. HEENT: [place] AT, moist mucus membranes. Trachea midline, no masses. Cardiovascular: No clubbing, cyanosis, or edema. Respiratory: Normal respiratory effort, no increased work of breathing. GI: Abdomen is soft, non tender, non distended, no abdominal masses. Liver and spleen not palpable.  No hernias appreciated.  Stool sample for occult testing is not indicated.   GU: No CVA tenderness.  No bladder fullness or masses.   Skin: No rashes, bruises or suspicious lesions. Lymph: No cervical or inguinal  adenopathy. Neurologic: Grossly intact, no focal deficits, moving all 4 extremities. Psychiatric: Normal mood and affect.  Laboratory Data: Lab Results  Component Value Date   WBC 5.8 05/10/2015   HGB 13.2 05/10/2015   HCT 38.4 05/10/2015   MCV 95.0 05/10/2015   PLT 237 05/10/2015    Lab Results  Component Value Date   CREATININE 0.58 05/10/2015     Urinalysis    Component Value Date/Time   COLORURINE YELLOW* 05/10/2015 1514   COLORURINE Yellow 10/14/2013 1903   APPEARANCEUR TURBID* 05/10/2015 1514   APPEARANCEUR Clear 10/14/2013 1903   LABSPEC 1.019 05/10/2015 1514   LABSPEC 1.014 10/14/2013 1903   PHURINE 7.0 05/10/2015 1514   PHURINE 5.0 10/14/2013 1903   GLUCOSEU NEGATIVE 05/10/2015 1514   GLUCOSEU Negative 10/14/2013 1903   HGBUR 1+* 05/10/2015 1514   HGBUR Negative 10/14/2013 1903   BILIRUBINUR NEGATIVE 05/10/2015 1514   BILIRUBINUR Negative 10/14/2013 1903   BILIRUBINUR neg 08/21/2013 1633   KETONESUR TRACE* 05/10/2015 1514   KETONESUR Trace 10/14/2013 1903   PROTEINUR >500* 05/10/2015 1514   PROTEINUR Negative 10/14/2013 1903   PROTEINUR trace 08/21/2013 1633   UROBILINOGEN 0.2 08/21/2013 1633   NITRITE NEGATIVE 05/10/2015 1514   NITRITE Negative 10/14/2013 1903   NITRITE neg 08/21/2013 1633   LEUKOCYTESUR 2+* 05/10/2015 1514   LEUKOCYTESUR Negative 10/14/2013 1903    Assessment & Plan:    1. Urinary incontinence:   Patient is managing her incontinence currently with indwelling Foley. She would like a suprapubic tube placed due to recurrent urinary tract infections and urethral breakdown.  I did advise the patient and her husband that a suprapubic tube would not eliminate the risk  urinary tract infections.  I did offer referral to infectious disease, but they declined.  I will schedule her for progressive dilation of SPT tube with IR.  The risks of the procedure explained to the patient and her husband. Patient's husband was concerned with the medications  using sedation. I explained to them today a Versed and  IV pain medicine during the procedure.   She would not have to undergo general anesthesia.   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   I explained to her and her husband that the procedure is done in stages.   That approximately every 30 days, she would return to IR for a larger catheter placement.   This would consist of 3 procedures.   The SPT to then can be exchanged by home health or with [place].   They voiced their understanding and they wished to proceed.  Greater than 50% was spent in counseling & coordination of care with the patient.     Return for SPT placement in IR.  Michiel Cowboy, PA-C  [facility] [address], [address] [place], [place] [address] [phone]

## 2015-05-28 ENCOUNTER — Encounter: Payer: Self-pay | Admitting: Urology

## 2015-05-28 ENCOUNTER — Telehealth: Payer: Self-pay | Admitting: Radiology

## 2015-05-28 DIAGNOSIS — R32 Unspecified urinary incontinence: Secondary | ICD-10-CM | POA: Insufficient documentation

## 2015-05-28 NOTE — Telephone Encounter (Signed)
LMOM to return call regarding SP tube placement scheduled 06/05/15. Need to notify pt to arrive at 7:45 to [facility] desk [facility]. Pt needs to be npo after mn day of surgery. [name] to go over instructions prior to the procedure.

## 2015-05-29 NOTE — Telephone Encounter (Signed)
Per pt's husband, Jennifer [name], this date does not work for them. SP tube placement r/s to 07/03/15. Pt to arrive at 7:45 at the registration desk [facility] the [facility]. Pt needs to be npo after mn day of surgery. [name] the week prior to the procedure. Mr Jennifer [name] voices understanding.

## 2015-06-05 ENCOUNTER — Ambulatory Visit: Payer: BLUE CROSS/BLUE SHIELD

## 2015-06-12 ENCOUNTER — Telehealth: Payer: Self-pay | Admitting: Family Medicine

## 2015-06-12 NOTE — Telephone Encounter (Signed)
Jennifer [name],[facility] was at patient's home.  Patient is having lower abdominal pain and burning when she urinates.  [name] requested a verbal order for urinalysis. I asked Dr.Aron and she approved order for urinalysis. Jennifer [name] notified.

## 2015-06-13 ENCOUNTER — Other Ambulatory Visit: Payer: Self-pay | Admitting: Family Medicine

## 2015-06-14 NOTE — Telephone Encounter (Signed)
Note from 12/2014 reviewed. Go ahead and phone in Valium please

## 2015-06-14 NOTE — Telephone Encounter (Signed)
Pt has not had any recent f/u appts. Acute only 

## 2015-06-18 ENCOUNTER — Telehealth: Payer: Self-pay | Admitting: Family Medicine

## 2015-06-18 MED ORDER — SULFAMETHOXAZOLE-TRIMETHOPRIM 800-160 MG PO TABS: 1.0000 | ORAL_TABLET | Freq: Two times a day (BID) | ORAL | Status: DC

## 2015-06-18 NOTE — Telephone Encounter (Signed)
Spoke to pts husband and advised per Dr Dayton MartesAron. Allergies verified and Rx sent to requested pharmacy

## 2015-06-18 NOTE — Telephone Encounter (Signed)
Yes [facility] today about sending in rx for this and she has already called it in.

## 2015-06-18 NOTE — Addendum Note (Signed)
Addended by: Desmond DikeKNIGHT, Vinod Mikesell H on: 06/18/2015 11:22 AM   Modules accepted: Orders

## 2015-06-18 NOTE — Telephone Encounter (Signed)
Received results in my box for [name]- UA and U C & S dated 06/12/15 as collection date. Culture grew >100,000 klebsiella resistant to several abx but sensitive to bactrim.  Sulfa not on allergy list. Please verify that she is not allergic to sulfa and then call in Bactrim DS- 1 tab twice daily x 10 days, #20 with no refills.

## 2015-06-18 NOTE — Telephone Encounter (Signed)
Misty StanleyLisa called wanting to make sure you received  Labs results for urine sample this was done on friday It shows uti  Please advise lisa

## 2015-06-19 NOTE — Telephone Encounter (Signed)
Lm on Jennifer [name]'s vm and advised I spoke to pts husband on yesterday regarding results and Tx and Rx was sent to pharmacy

## 2015-06-22 ENCOUNTER — Other Ambulatory Visit: Payer: Self-pay | Admitting: Family Medicine

## 2015-06-26 ENCOUNTER — Encounter: Payer: Self-pay | Admitting: Emergency Medicine

## 2015-06-26 ENCOUNTER — Emergency Department: Payer: BLUE CROSS/BLUE SHIELD

## 2015-06-26 ENCOUNTER — Inpatient Hospital Stay
Admission: EM | Admit: 2015-06-26 | Discharge: 2015-06-29 | DRG: 698 | Disposition: A | Discharge status: Home Health | Payer: BLUE CROSS/BLUE SHIELD | Attending: Internal Medicine | Admitting: Internal Medicine

## 2015-06-26 DIAGNOSIS — R443 Hallucinations, unspecified: Secondary | ICD-10-CM

## 2015-06-26 DIAGNOSIS — F319 Bipolar disorder, unspecified: Secondary | ICD-10-CM | POA: Diagnosis present

## 2015-06-26 DIAGNOSIS — Z818 Family history of other mental and behavioral disorders: Secondary | ICD-10-CM

## 2015-06-26 DIAGNOSIS — F29 Unspecified psychosis not due to a substance or known physiological condition: Secondary | ICD-10-CM | POA: Diagnosis present

## 2015-06-26 DIAGNOSIS — Z825 Family history of asthma and other chronic lower respiratory diseases: Secondary | ICD-10-CM | POA: Diagnosis not present

## 2015-06-26 DIAGNOSIS — N390 Urinary tract infection, site not specified: Secondary | ICD-10-CM | POA: Diagnosis present

## 2015-06-26 DIAGNOSIS — K219 Gastro-esophageal reflux disease without esophagitis: Secondary | ICD-10-CM | POA: Diagnosis present

## 2015-06-26 DIAGNOSIS — I959 Hypotension, unspecified: Secondary | ICD-10-CM | POA: Diagnosis present

## 2015-06-26 DIAGNOSIS — Z7401 Bed confinement status: Secondary | ICD-10-CM | POA: Diagnosis not present

## 2015-06-26 DIAGNOSIS — Z885 Allergy status to narcotic agent status: Secondary | ICD-10-CM

## 2015-06-26 DIAGNOSIS — G9340 Encephalopathy, unspecified: Secondary | ICD-10-CM | POA: Diagnosis present

## 2015-06-26 DIAGNOSIS — Z809 Family history of malignant neoplasm, unspecified: Secondary | ICD-10-CM

## 2015-06-26 DIAGNOSIS — Z87440 Personal history of urinary (tract) infections: Secondary | ICD-10-CM

## 2015-06-26 DIAGNOSIS — Z7951 Long term (current) use of inhaled steroids: Secondary | ICD-10-CM

## 2015-06-26 DIAGNOSIS — Z888 Allergy status to other drugs, medicaments and biological substances status: Secondary | ICD-10-CM

## 2015-06-26 DIAGNOSIS — G1221 Amyotrophic lateral sclerosis: Secondary | ICD-10-CM | POA: Diagnosis present

## 2015-06-26 DIAGNOSIS — Z87891 Personal history of nicotine dependence: Secondary | ICD-10-CM | POA: Diagnosis not present

## 2015-06-26 DIAGNOSIS — Z79899 Other long term (current) drug therapy: Secondary | ICD-10-CM

## 2015-06-26 DIAGNOSIS — Z8249 Family history of ischemic heart disease and other diseases of the circulatory system: Secondary | ICD-10-CM | POA: Diagnosis not present

## 2015-06-26 DIAGNOSIS — K5900 Constipation, unspecified: Secondary | ICD-10-CM | POA: Diagnosis present

## 2015-06-26 DIAGNOSIS — Z9889 Other specified postprocedural states: Secondary | ICD-10-CM

## 2015-06-26 DIAGNOSIS — T83511A Infection and inflammatory reaction due to indwelling urethral catheter, initial encounter: Secondary | ICD-10-CM | POA: Diagnosis present

## 2015-06-26 LAB — CBC WITH DIFFERENTIAL/PLATELET
BASOS ABS: 0.1 10*3/uL (ref 0–0.1)
BASOS PCT: 1 %
EOS ABS: 0.1 10*3/uL (ref 0–0.7)
EOS PCT: 2 %
HCT: 34.2 % — ABNORMAL LOW (ref 35.0–47.0)
HEMOGLOBIN: 11.8 g/dL — AB (ref 12.0–16.0)
LYMPHS ABS: 1.5 10*3/uL (ref 1.0–3.6)
Lymphocytes Relative: 23 %
MCH: 32.2 pg (ref 26.0–34.0)
MCHC: 34.5 g/dL (ref 32.0–36.0)
MCV: 93.4 fL (ref 80.0–100.0)
Monocytes Absolute: 0.3 10*3/uL (ref 0.2–0.9)
Monocytes Relative: 4 %
NEUTROS PCT: 70 %
Neutro Abs: 4.6 10*3/uL (ref 1.4–6.5)
PLATELETS: 197 10*3/uL (ref 150–440)
RBC: 3.66 MIL/uL — AB (ref 3.80–5.20)
RDW: 12.8 % (ref 11.5–14.5)
WBC: 6.5 10*3/uL (ref 3.6–11.0)

## 2015-06-26 LAB — URINALYSIS COMPLETE WITH MICROSCOPIC (ARMC ONLY)
BILIRUBIN URINE: NEGATIVE
Glucose, UA: NEGATIVE mg/dL
HGB URINE DIPSTICK: NEGATIVE
KETONES UR: NEGATIVE mg/dL
NITRITE: POSITIVE — AB
PH: 6 (ref 5.0–8.0)
PROTEIN: 30 mg/dL — AB
Specific Gravity, Urine: 1.015 (ref 1.005–1.030)

## 2015-06-26 LAB — LIPASE, BLOOD: Lipase: 29 U/L (ref 11–51)

## 2015-06-26 LAB — COMPREHENSIVE METABOLIC PANEL
ALBUMIN: 3.6 g/dL (ref 3.5–5.0)
ALK PHOS: 56 U/L (ref 38–126)
ALT: 20 U/L (ref 14–54)
AST: 20 U/L (ref 15–41)
Anion gap: 10 (ref 5–15)
BUN: 10 mg/dL (ref 6–20)
CALCIUM: 9.5 mg/dL (ref 8.9–10.3)
CHLORIDE: 105 mmol/L (ref 101–111)
CO2: 21 mmol/L — AB (ref 22–32)
CREATININE: 0.67 mg/dL (ref 0.44–1.00)
GFR calc non Af Amer: >60 mL/min (ref >60)
GLUCOSE: 110 mg/dL — AB (ref 65–99)
Potassium: 2.8 mmol/L — CL (ref 3.5–5.1)
SODIUM: 136 mmol/L (ref 135–145)
Total Bilirubin: 0.7 mg/dL (ref 0.3–1.2)
Total Protein: 6.4 g/dL — ABNORMAL LOW (ref 6.5–8.1)

## 2015-06-26 LAB — AMMONIA: Ammonia: 26 umol/L (ref 9–35)

## 2015-06-26 MED ORDER — BISACODYL 10 MG RE SUPP: 10.0000 mg | Freq: Every day | RECTAL | Status: DC | PRN

## 2015-06-26 MED ORDER — ENOXAPARIN SODIUM 40 MG/0.4ML ~~LOC~~ SOLN
40.0000 mg | SUBCUTANEOUS | Status: DC
  Administered 2015-06-26: 40 mg via SUBCUTANEOUS
  Filled 2015-06-26: qty 0.4

## 2015-06-26 MED ORDER — DEXTROSE 5 % IV SOLN
1.0000 g | Freq: Once | INTRAVENOUS | Status: DC
  Filled 2015-06-26: qty 10

## 2015-06-26 MED ORDER — FLEET ENEMA 7-19 GM/118ML RE ENEM
1.0000 | ENEMA | Freq: Once | RECTAL | Status: AC
  Administered 2015-06-26: 1 via RECTAL

## 2015-06-26 MED ORDER — BUPROPION HCL ER (SR) 150 MG PO TB12
300.0000 mg | ORAL_TABLET | Freq: Two times a day (BID) | ORAL | Status: DC
  Administered 2015-06-26 – 2015-06-29 (×6): 300 mg via ORAL
  Filled 2015-06-26 (×7): qty 2

## 2015-06-26 MED ORDER — TIZANIDINE HCL 4 MG PO TABS
2.0000 mg | ORAL_TABLET | Freq: Every day | ORAL | Status: DC
  Administered 2015-06-26 – 2015-06-28 (×3): 2 mg via ORAL
  Filled 2015-06-26 (×3): qty 1

## 2015-06-26 MED ORDER — SODIUM CHLORIDE 0.9 % IV BOLUS (SEPSIS)
500.0000 mL | Freq: Once | INTRAVENOUS | Status: AC
  Administered 2015-06-26: 500 mL via INTRAVENOUS

## 2015-06-26 MED ORDER — ALBUTEROL SULFATE (2.5 MG/3ML) 0.083% IN NEBU: 2.5000 mg | INHALATION_SOLUTION | RESPIRATORY_TRACT | Status: DC | PRN

## 2015-06-26 MED ORDER — DEXTROMETHORPHAN-QUINIDINE 20-10 MG PO CAPS: 1.0000 | ORAL_CAPSULE | Freq: Two times a day (BID) | ORAL | Status: DC

## 2015-06-26 MED ORDER — LACTULOSE 10 GM/15ML PO SOLN
30.0000 g | Freq: Every day | ORAL | Status: DC
  Administered 2015-06-26 – 2015-06-29 (×4): 30 g via ORAL
  Filled 2015-06-26 (×4): qty 60

## 2015-06-26 MED ORDER — FLEET ENEMA 7-19 GM/118ML RE ENEM: 1.0000 | ENEMA | Freq: Once | RECTAL | Status: DC | PRN

## 2015-06-26 MED ORDER — POTASSIUM CHLORIDE IN NACL 40-0.9 MEQ/L-% IV SOLN
INTRAVENOUS | Status: DC
  Administered 2015-06-26: 75 mL/h via INTRAVENOUS
  Administered 2015-06-28: 100 mL/h via INTRAVENOUS
  Filled 2015-06-26 (×6): qty 1000

## 2015-06-26 MED ORDER — ATROPINE SULFATE 1 % OP SOLN: 2.0000 [drp] | OPHTHALMIC | Status: DC | PRN

## 2015-06-26 MED ORDER — POTASSIUM CHLORIDE CRYS ER 20 MEQ PO TBCR
40.0000 meq | EXTENDED_RELEASE_TABLET | ORAL | Status: AC
  Administered 2015-06-26: 40 meq via ORAL
  Filled 2015-06-26: qty 2

## 2015-06-26 MED ORDER — POLYETHYLENE GLYCOL 3350 17 G PO PACK: 17.0000 g | PACK | Freq: Every day | ORAL | Status: DC | PRN

## 2015-06-26 MED ORDER — ONDANSETRON HCL 4 MG PO TABS: 4.0000 mg | ORAL_TABLET | Freq: Four times a day (QID) | ORAL | Status: DC | PRN

## 2015-06-26 MED ORDER — SODIUM CHLORIDE 0.9 % IV SOLN
1.0000 g | INTRAVENOUS | Status: DC
  Administered 2015-06-26 – 2015-06-28 (×3): 1 g via INTRAVENOUS
  Filled 2015-06-26 (×6): qty 1

## 2015-06-26 MED ORDER — POTASSIUM CHLORIDE 20 MEQ/15ML (10%) PO SOLN
20.0000 meq | Freq: Every day | ORAL | Status: DC
  Administered 2015-06-27 – 2015-06-28 (×2): 20 meq via ORAL
  Filled 2015-06-26 (×2): qty 15

## 2015-06-26 MED ORDER — LEVETIRACETAM 500 MG PO TABS
500.0000 mg | ORAL_TABLET | Freq: Two times a day (BID) | ORAL | Status: DC
  Administered 2015-06-26 – 2015-06-29 (×6): 500 mg via ORAL
  Filled 2015-06-26 (×6): qty 1

## 2015-06-26 MED ORDER — LORATADINE 10 MG PO TABS: 10.0000 mg | ORAL_TABLET | Freq: Every day | ORAL | Status: DC | PRN

## 2015-06-26 MED ORDER — DEXTROMETHORPHAN-QUINIDINE 20-10 MG PO CAPS
1.0000 | ORAL_CAPSULE | Freq: Two times a day (BID) | ORAL | Status: DC
  Administered 2015-06-26: 22:00:00 via ORAL
  Administered 2015-06-27 – 2015-06-29 (×5): 1 via ORAL
  Filled 2015-06-26 (×2): qty 1

## 2015-06-26 MED ORDER — SENNA 8.6 MG PO TABS
1.0000 | ORAL_TABLET | Freq: Two times a day (BID) | ORAL | Status: DC
  Administered 2015-06-26 – 2015-06-29 (×6): 8.6 mg via ORAL
  Filled 2015-06-26 (×6): qty 1

## 2015-06-26 MED ORDER — FLUTICASONE PROPIONATE 50 MCG/ACT NA SUSP: 2.0000 | Freq: Every day | NASAL | Status: DC | PRN

## 2015-06-26 MED ORDER — DIAZEPAM 5 MG PO TABS
10.0000 mg | ORAL_TABLET | Freq: Four times a day (QID) | ORAL | Status: DC | PRN
  Administered 2015-06-27 – 2015-06-28 (×4): 10 mg via ORAL
  Filled 2015-06-26 (×2): qty 5
  Filled 2015-06-26: qty 2
  Filled 2015-06-26 (×2): qty 5
  Filled 2015-06-26: qty 2

## 2015-06-26 MED ORDER — ONDANSETRON HCL 4 MG/2ML IJ SOLN: 4.0000 mg | Freq: Four times a day (QID) | INTRAMUSCULAR | Status: DC | PRN

## 2015-06-26 MED ORDER — FAMOTIDINE 20 MG PO TABS
20.0000 mg | ORAL_TABLET | Freq: Two times a day (BID) | ORAL | Status: DC
  Administered 2015-06-26 – 2015-06-29 (×6): 20 mg via ORAL
  Filled 2015-06-26 (×6): qty 1

## 2015-06-26 MED ORDER — ARIPIPRAZOLE 5 MG PO TABS
2.5000 mg | ORAL_TABLET | Freq: Two times a day (BID) | ORAL | Status: DC
  Administered 2015-06-26 – 2015-06-29 (×6): 2.5 mg via ORAL
  Filled 2015-06-26 (×7): qty 1

## 2015-06-26 NOTE — ED Notes (Signed)
MD at bedside. 

## 2015-06-26 NOTE — H&P (Addendum)
[facility] [place] at [facility]   PATIENT NAME: Jennifer [name]    MR#:  [id number]  DATE OF BIRTH:  [birth year]  DATE OF ADMISSION:  06/26/2015  PRIMARY CARE PHYSICIAN: Ruthe Mannan, MD   REQUESTING/REFERRING PHYSICIAN: Dr. Inocencio Homes  CHIEF COMPLAINT:   Chief Complaint  Patient presents with  . Hallucinations    HISTORY OF PRESENT ILLNESS:  Jennifer [name]  is a 54 y.o. female with a known history of ALS, chronic Foley catheter is brought to the emergency room by husband due to hallucinations, weakness and confusion. Patient does have psychiatric issues and generally gets agitated due to those. Patient has been treated with fosfomycin in October 2016 for a UTI which seem to have improved her mentation. Recently was started by her PCP on Bactrim to which she has not responded. Urine cultures are not available. 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 Febrile and has normal white count. Urinalysis shows too numerous to count WBC and many bacteria. Patient has follow-up with urology on December 8 for a suprapubic catheter.  PAST MEDICAL HISTORY:   Past Medical History  Diagnosis Date  . ALS (amyotrophic lateral sclerosis) (HCC)   . Depression   . GERD (gastroesophageal reflux disease)   . Urinary retention   . Dysphagia, oropharyngeal 11/28/2013  . CN (constipation) 11/28/2013    PAST SURGICAL HISTORY:   Past Surgical History  Procedure Laterality Date  . Cesarean section    . Nasal sinus surgery      SOCIAL HISTORY:   Social History  Substance Use Topics  . Smoking status: Former Games developer  . Smokeless tobacco: Never Used  . Alcohol Use: No    FAMILY HISTORY:   Family History  Problem Relation Age of Onset  . COPD Mother   . Thyroid disease Mother   . Diverticulosis Mother   . Heart attack Mother   . Gout Father   . Depression Sister   . Thyroid disease Sister   . Cancer Brother     kidney  . Heart attack Maternal Grandmother   .  Heart attack Maternal Grandfather   . Heart attack Paternal Grandfather   . Hyperlipidemia Sister   . Evelene Croon Parkinson White syndrome Sister 25  . Depression Brother   . Hypertension Brother     DRUG ALLERGIES:   Allergies  Allergen Reactions  . Bee Venom Anaphylaxis  . Hydrocodone Other (See Comments)    Reaction:  Muscle stiffness   . Lipitor [Atorvastatin] Other (See Comments)    Reaction:  Muscle cramps   . Shellfish Allergy Anaphylaxis  . Oxycodone Other (See Comments)    Reaction:  Muscle stiffness   . Prednisone Other (See Comments)    Reaction:  Psychosis   . Vicodin [Hydrocodone-Acetaminophen] Other (See Comments)    Reaction:  Muscle stiffness   . Oxycontin [Oxycodone Hcl] Other (See Comments)    Reaction:  Muscle stiffness   . Peanut-Containing Drug Products Diarrhea    REVIEW OF SYSTEMS:   Review of Systems  Unable to perform ROS: mental acuity    MEDICATIONS AT HOME:   Prior to Admission medications   Medication Sig Start Date End Date Taking? Authorizing Provider  ARIPiprazole (ABILIFY) 5 MG tablet Take 5 mg by mouth daily.   Yes Historical Provider, MD  Dextromethorphan-Quinidine (NUEDEXTA) 20-10 MG CAPS Take 1 capsule by mouth 2 (two) times daily.    Yes Historical Provider, MD  diazepam (VALIUM) 10 MG tablet Take 1  tablet (10 mg total) by mouth every 6 (six) hours as needed for anxiety. 04/08/15  Yes Dianne Dunalia M Aron, MD  sulfamethoxazole-trimethoprim (BACTRIM DS,SEPTRA DS) 800-160 MG tablet Take 1 tablet by mouth 2 (two) times daily. 06/18/15  Yes Dianne Dunalia M Aron, MD  atropine 1 % ophthalmic solution Apply 2 drops onto tongue every 4 hours as needed for drooling 03/26/15   Historical Provider, MD  benzonatate (TESSALON) 100 MG capsule Take 1 capsule (100 mg total) by mouth 3 (three) times daily as needed for cough. 01/03/15   Iwao Shamblin, MD  buPROPion (WELLBUTRIN SR) 150 MG 12 hr tablet TAKE 1 TABLET BY MOUTH 4 TIMES DAILY. 05/06/15   Dianne Dunalia M Aron, MD   cholecalciferol (VITAMIN D) 1000 UNITS tablet Take 1,000 Units by mouth daily.    Historical Provider, MD  divalproex (DEPAKOTE) 250 MG DR tablet TAKE 1 TABLET BY MOUTH EVERY 12 HOURS. Patient not taking: Reported on 05/27/2015 03/25/15   Dianne Dunalia M Aron, MD  fluticasone [facility](FLONASE) 50 MCG/ACT nasal spray USE 2 SPRAYS IN [facility] NO[facility]IL EVERY DAY AS DIRECTED 04/15/15   Dianne Dunalia M Aron, MD  fosfomycin (MONUROL) 3 G PACK Take 3 g by mouth once. Take 3 grams every other day for two more doses (Sunday, then Tuesday). Patient not taking: Reported on 05/27/2015 05/10/15   Darien Ramusavid W Kaminski, MD  ibuprofen (ADVIL,MOTRIN) 200 MG tablet Take 800 mg by mouth 3 (three) times daily as needed for mild pain.     Historical Provider, MD  lactulose (CHRONULAC) 10 GM/15ML solution Take 10 g by mouth daily as needed for mild constipation.     Historical Provider, MD  lactulose (CHRONULAC) 10 GM/15ML solution Take 15 mLs (10 g total) by mouth 3 (three) times daily. 02/27/15   Rebecka ApleyAllison P Webster, MD  levETIRAcetam (KEPPRA) 500 MG tablet Take 500 mg by mouth 2 (two) times daily.     Historical Provider, MD  loratadine (CLARITIN) 10 MG tablet Take 10 mg by mouth daily as needed for allergies.     Historical Provider, MD  omeprazole (PRILOSEC) 40 MG capsule Take 40 mg by mouth daily.     Historical Provider, MD  potassium chloride 20 MEQ/15ML (10%) SOLN  05/12/15   Historical Provider, MD  potassium chloride SA (K-DUR,KLOR-CON) 20 MEQ tablet Take 1 tablet (20 mEq total) by mouth once. 05/10/15   Darien Ramusavid W Kaminski, MD  ranitidine (ZANTAC) 150 MG tablet Take 150 mg by mouth daily as needed for heartburn.    Historical Provider, MD  tizanidine (ZANAFLEX) 2 MG capsule Take 2 mg by mouth at bedtime as needed for muscle spasms.     Historical Provider, MD      VITAL SIGNS:  Blood pressure 102/79, pulse 81, temperature 97.7 F (36.5 C), temperature source Oral, resp. rate 19, height 4\' 11"  (1.499 m), weight 44.453 kg (98 lb), SpO2 98  %.  PHYSICAL EXAMINATION:  Physical Exam  GENERAL:  54 y.o.-year-old patient lying in the bed with no acute distress. Drowsy EYES: Pupils equal, round, reactive to light and accommodation. No scleral icterus. Extraocular muscles intact.  HEENT: Head atraumatic, normocephalic. Oropharynx and nasopharynx clear. No oropharyngeal erythema, moist oral mucosa  NECK:  Supple, no jugular venous distention. No thyroid enlargement, no tenderness.  LUNGS: Normal breath sounds bilaterally, no wheezing, rales, rhonchi. No use of accessory muscles of respiration.  CARDIOVASCULAR: S1, S2 normal. No murmurs, rubs, or gallops.  ABDOMEN: Soft, nontender, nondistended. Bowel sounds present. No organomegaly or mass. Foley catheter in  place with clear urine EXTREMITIES: No pedal edema, cyanosis, or clubbing. + 2 pedal & radial pulses b/l.   NEUROLOGIC: Slumped over to the left side. Not following instructions. PSYCHIATRIC: The patient is drowzy SKIN: No obvious rash, lesion, or ulcer.   LABORATORY PANEL:   CBC  Recent Labs Lab 06/26/15 1653  WBC 6.5  HGB 11.8*  HCT 34.2*  PLT 197   ------------------------------------------------------------------------------------------------------------------  Chemistries   Recent Labs Lab 06/26/15 1653  NA 136  K 2.8*  CL 105  CO2 21*  GLUCOSE 110*  BUN 10  CREATININE 0.67  CALCIUM 9.5  AST 20  ALT 20  ALKPHOS 56  BILITOT 0.7   ------------------------------------------------------------------------------------------------------------------  Cardiac Enzymes No results for input(s): TROPONINI in the last 168 hours. ------------------------------------------------------------------------------------------------------------------  RADIOLOGY:  Ct Head Wo Contrast  06/26/2015  CLINICAL DATA:  Hallucinations. Urinary tract infection. Altered mental status. EXAM: CT HEAD WITHOUT CONTRAST TECHNIQUE: Contiguous axial images were obtained from the base  of the skull through the vertex without intravenous contrast. COMPARISON:  CT head without contrast 01/25/2015. FINDINGS: Mild atrophy and white matter disease is stable. No acute cortical infarct, hemorrhage, or mass lesion is present. The ventricles are proportionate to the degree of atrophy. No significant extra-axial fluid collection is present. The paranasal sinuses and mastoid air cells are clear. Calvarium is intact. IMPRESSION: 1. Stable atrophy and white matter disease. 2. No acute intracranial abnormality. Electronically Signed   By: Marin Roberts M.D.   On: 06/26/2015 16:44   Dg Abd Acute W/chest  06/26/2015  CLINICAL DATA:  Altered mental status. Hallucinations for 2 days. No bowel movement for 5 days. EXAM: DG ABDOMEN ACUTE W/ 1V CHEST COMPARISON:  02/26/2015 FINDINGS: There is no evidence of dilated bowel loops or free intraperitoneal air. There is a large amount of stool in the ascending and descending colon and rectum. No radiopaque calculi or other significant radiographic abnormality is seen. Heart size and mediastinal contours are within normal limits. Both lungs are clear. IMPRESSION: Large amount of stool in the ascending and descending colon and rectum. No acute cardiopulmonary disease. Electronically Signed   By: Elige Ko   On: 06/26/2015 16:21     IMPRESSION AND PLAN:   * UTI with acute encephalopathy, Indwelling catheter related Patient has recurrent UTI. 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 Was treated with fosfomycin in October and responded. At this time we'll start her on Invanz daily. Urine cultures have been sent. Consult infectious disease. Zyvox without interactions with her multiple psychiatric medications.  * Bipolar disorder with psychosis Waiting for complete home medication list at this point. We'll continue medications. If symptoms do not improve will need to consult psychiatry.  * ALS Patient is bedbound at baseline.  *  Constipation Patient takes daily Colace and lactulose which will be continued. I will order fleets enema and as needed suppository.  * DVT prophylaxis with Lovenox.   All the records are reviewed and case discussed with ED provider. Management plans discussed with the patient, family and they are in agreement.  CODE STATUS: DNI/ OK to resuscitate  TOTAL TIME TAKING CARE OF THIS PATIENT: 50 minutes.    Milagros Loll R M.D on 06/26/2015 at 7:07 PM  Between 7am to 6pm - Pager - [phone]  After 6pm go to [email or web address] - [email or web address]  [facility]  Office  [phone]  CC: Primary care physician; Ruthe Mannan, MD     Note: This dictation was prepared with  Dragon dictation along with smaller Company secretary. Any transcriptional errors that result from this process are unintentional.

## 2015-06-26 NOTE — ED Notes (Signed)
Pt comes into the ED via [facility] from home with family.  Complaint includes increased hallucinations for 2x days.  Patient is currently being treated for UTI and on abx.  Patient has had no bowel movement for 5 days.  A&Ox4 currently.  108/88, 76 HR, 16 RR, 98 % room air.
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
## 2015-06-27 LAB — BASIC METABOLIC PANEL
ANION GAP: 8 (ref 5–15)
BUN: 8 mg/dL (ref 6–20)
CHLORIDE: 110 mmol/L (ref 101–111)
CO2: 21 mmol/L — AB (ref 22–32)
Calcium: 8.5 mg/dL — ABNORMAL LOW (ref 8.9–10.3)
Creatinine, Ser: 0.6 mg/dL (ref 0.44–1.00)
GFR calc non Af Amer: >60 mL/min (ref >60)
Glucose, Bld: 78 mg/dL (ref 65–99)
POTASSIUM: 3.3 mmol/L — AB (ref 3.5–5.1)
SODIUM: 139 mmol/L (ref 135–145)

## 2015-06-27 LAB — URINE CULTURE

## 2015-06-27 LAB — CBC
HCT: 31.8 % — ABNORMAL LOW (ref 35.0–47.0)
HEMOGLOBIN: 10.7 g/dL — AB (ref 12.0–16.0)
MCH: 31.8 pg (ref 26.0–34.0)
MCHC: 33.6 g/dL (ref 32.0–36.0)
MCV: 94.6 fL (ref 80.0–100.0)
Platelets: 186 10*3/uL (ref 150–440)
RBC: 3.36 MIL/uL — AB (ref 3.80–5.20)
RDW: 12.6 % (ref 11.5–14.5)
WBC: 5.9 10*3/uL (ref 3.6–11.0)

## 2015-06-27 LAB — MRSA PCR SCREENING: MRSA BY PCR: NEGATIVE

## 2015-06-27 LAB — CK: Total CK: 48 U/L (ref 38–234)

## 2015-06-27 MED ORDER — SODIUM CHLORIDE 0.9 % IV SOLN
240.0000 mg | INTRAVENOUS | Status: DC
  Administered 2015-06-27 – 2015-06-28 (×2): 240 mg via INTRAVENOUS
  Filled 2015-06-27 (×2): qty 4.8

## 2015-06-27 MED ORDER — NOREPINEPHRINE 4 MG/250ML-% IV SOLN
0.0000 ug/min | INTRAVENOUS | Status: DC
  Filled 2015-06-27: qty 250

## 2015-06-27 MED ORDER — NOREPINEPHRINE BITARTRATE 1 MG/ML IV SOLN: 4.0000 ug/min | INTRAVENOUS | Status: DC

## 2015-06-27 MED ORDER — SODIUM CHLORIDE 0.9 % IV BOLUS (SEPSIS)
250.0000 mL | Freq: Once | INTRAVENOUS | Status: AC
  Administered 2015-06-27: 250 mL via INTRAVENOUS

## 2015-06-27 MED ORDER — ENOXAPARIN SODIUM 30 MG/0.3ML ~~LOC~~ SOLN
30.0000 mg | SUBCUTANEOUS | Status: DC
  Administered 2015-06-27 – 2015-06-28 (×2): 30 mg via SUBCUTANEOUS
  Filled 2015-06-27 (×3): qty 0.3

## 2015-06-27 NOTE — Progress Notes (Signed)
MEDICATION RELATED CONSULT NOTE    Pharmacy Automatic Consult for enoxaparin dose Indication: VTE prophylaxis   Patient Measurements: Height: 4\' 11"  (149.9 cm) Weight: 92 lb 9.5 oz (42 kg) IBW/kg (Calculated) : 43.2  Estimated Creatinine Clearance: 53.3 mL/min (by C-G formula based on Cr of 0.6).  Assessment: 54 yo patient requiring VTE prophylaxis with enoxaparin.  Patient is 42 kg, CrCl 53, and Scr 0.6.  Plan:  [facility] patient is candidate for 30mg  daily dosing based on Weight <45kg.   Will reduce 40mg  dose to 30mg  daily.   Cher NakaiSheema Axie Hayne, PharmD Pharmacy Resident 06/27/2015

## 2015-06-27 NOTE — Progress Notes (Signed)
[facility]  Patient Status: active   [facility] is providing the following services: SN/HHA  If patient discharges after hours, please call [phone].   Scherrie GerlachJason E Hinton 06/27/2015, 10:50 AM

## 2015-06-27 NOTE — Progress Notes (Signed)
ID E note Reviewed cx and labs as well as hospital notes. Transferred to ICU Prior UTIs with VRE.  Rec COnt ertapenem Start daptomycin 6mg /kg qd Repeat ucx as cx was mixed

## 2015-06-27 NOTE — Progress Notes (Signed)
Nurse aide reported blood pressure low,  Manual taken and showed same 76/42.  Dr. Enedina FinnerSona Patel paged to advise.

## 2015-06-27 NOTE — Consult Note (Signed)
ANTIBIOTIC CONSULT NOTE - INITIAL  Pharmacy Consult for Daptomycin Indication: Bacteremia/Sepsis/Prior VRE   Patient Measurements: Height: 4\' 11"  (149.9 cm) Weight: 92 lb 9.5 oz (42 kg) IBW/kg (Calculated) : 43.2 Vital Signs: Temp: 98.9 F (37.2 C) (12/01 1200) Temp Source: Oral (12/01 1200) BP: 74/42 mmHg (12/01 0947) Pulse Rate: 86 (12/01 0729)  Recent Labs  06/26/15 1653 06/27/15 0536  WBC 6.5 5.9  HGB 11.8* 10.7*  PLT 197 186  CREATININE 0.67 0.60   Estimated Creatinine Clearance: 53.3 mL/min (by C-G formula based on Cr of 0.6).  Microbiology: Recent Results (from the past 720 hour(s))  Urine culture     Status: None   Collection Time: 06/26/15  4:54 PM  Result Value Ref Range Status   Specimen Description URINE, CATHETERIZED  Final   Special Requests NONE  Final   Culture MULTIPLE SPECIES PRESENT, SUGGEST RECOLLECTION  Final   Report Status 06/27/2015 FINAL  Final  Blood culture (routine x 2)     Status: None (Preliminary result)   Collection Time: 06/26/15  6:51 PM  Result Value Ref Range Status   Specimen Description BLOOD RIGHT HAND  Final   Special Requests BOTTLES DRAWN AEROBIC AND ANAEROBIC 5CC  Final   Culture NO GROWTH < 12 HOURS  Final   Report Status PENDING  Incomplete  Blood culture (routine x 2)     Status: None (Preliminary result)   Collection Time: 06/26/15  6:51 PM  Result Value Ref Range Status   Specimen Description BLOOD LEFT HAND  Final   Special Requests BOTTLES DRAWN AEROBIC AND ANAEROBIC 5CC  Final   Culture NO GROWTH < 12 HOURS  Final   Report Status PENDING  Incomplete    Assessment: 54 yo patient with ALS, indwelling catheter and PMH of VRE UTI. Pharmacy has been consulted per ID for daptomycin dosing for Bacteremia/Sepsis/VRE UTI. Patient is also receiving Ertapenem IV for possible ESBL UTI.   CrCl:6253ml/min   Scr: 0.6  BC X2: NG x12 hours UC: multiple species present. Repeat sent.   Plan:  Will start patient on Daptomycin  240mg  daily (6mg  x 41 kg).  Will check a baseline CPK level. Recommend monitioring CPK weekly while on Daptomycin. Pharmacy will continue to monitor renal function, labs and susceptibilities per consult.  Cher NakaiSheema Emari Demmer, PharmD Pharmacy Resident  06/27/2015,1:00 PM

## 2015-06-27 NOTE — Progress Notes (Addendum)
Patient ID: Sula RumpleKatherine Macht, female   DOB: 1960-10-06, 54 y.o.   MRN: [id number] [facility] [place] [facility]   PATIENT NAME: Sula RumpleKatherine Stoneking    MR#:  [id number]  DATE OF BIRTH:  1960-10-06  SUBJECTIVE:  Weak. Not able to understand. Caregiver in the room  REVIEW OF SYSTEMS:   Review of Systems  Constitutional: Negative for fever, chills and weight loss.  HENT: Negative for ear discharge, ear pain and nosebleeds.   Eyes: Negative for blurred vision, pain and discharge.  Respiratory: Negative for sputum production, shortness of breath, wheezing and stridor.   Cardiovascular: Negative for chest pain, palpitations, orthopnea and PND.  Gastrointestinal: Negative for nausea, vomiting, abdominal pain and diarrhea.  Genitourinary: Negative for urgency and frequency.  Musculoskeletal: Negative for back pain and joint pain.  Neurological: Negative for sensory change, speech change, focal weakness and weakness.  Psychiatric/Behavioral: Negative for depression and hallucinations. The patient is not nervous/anxious.    Tolerating Diet:yes Tolerating PT: bed bound due to ALS  DRUG ALLERGIES:   Allergies  Allergen Reactions  . Bee Venom Anaphylaxis  . Hydrocodone Other (See Comments)    Reaction:  Muscle stiffness   . Lipitor [Atorvastatin] Other (See Comments)    Reaction:  Muscle cramps   . Shellfish Allergy Anaphylaxis  . Oxycodone Other (See Comments)    Reaction:  Muscle stiffness   . Prednisone Other (See Comments)    Reaction:  Psychosis   . Vicodin [Hydrocodone-Acetaminophen] Other (See Comments)    Reaction:  Muscle stiffness   . Oxycontin [Oxycodone Hcl] Other (See Comments)    Reaction:  Muscle stiffness   . Peanut-Containing Drug Products Diarrhea    VITALS:  Blood pressure 74/42, pulse 86, temperature 98.9 F (37.2 C), temperature source Oral, resp. rate 16, height 4\' 11"  (1.499 m), weight 42 kg (92 lb 9.5 oz), SpO2 95 %.  PHYSICAL  EXAMINATION:   Physical Exam  GENERAL:  54 y.o.-year-old patient lying in the bed with no acute distress.  EYES: Pupils equal, round, reactive to light and accommodation. No scleral icterus. Extraocular muscles intact.  HEENT: Head atraumatic, normocephalic. Oropharynx and nasopharynx clear.  NECK:  Supple, no jugular venous distention. No thyroid enlargement, no tenderness.  LUNGS: Normal breath sounds bilaterally, no wheezing, rales, rhonchi. No use of accessory muscles of respiration.  CARDIOVASCULAR: S1, S2 normal. No murmurs, rubs, or gallops.  ABDOMEN: Soft, nontender, nondistended. Bowel sounds present. No organomegaly or mass. Chronic foley EXTREMITIES: No cyanosis, clubbing or edema b/l.    NEUROLOGIC: unable to assess due to severe contractures from ALS PSYCHIATRIC:  patient is alert  SKIN: No obvious rash, lesion, or ulcer.    LABORATORY PANEL:   CBC  Recent Labs Lab 06/27/15 0536  WBC 5.9  HGB 10.7*  HCT 31.8*  PLT 186    Chemistries   Recent Labs Lab 06/26/15 1653 06/27/15 0536  NA 136 139  K 2.8* 3.3*  CL 105 110  CO2 21* 21*  GLUCOSE 110* 78  BUN 10 8  CREATININE 0.67 0.60  CALCIUM 9.5 8.5*  AST 20  --   ALT 20  --   ALKPHOS 56  --   BILITOT 0.7  --     Cardiac Enzymes No results for input(s): TROPONINI in the last 168 hours.  RADIOLOGY:  Ct Head Wo Contrast  06/26/2015  CLINICAL DATA:  Hallucinations. Urinary tract infection. Altered mental status. EXAM: CT HEAD WITHOUT CONTRAST TECHNIQUE: Contiguous axial images were obtained  from the base of the skull through the vertex without intravenous contrast. COMPARISON:  CT head without contrast 01/25/2015. FINDINGS: Mild atrophy and white matter disease is stable. No acute cortical infarct, hemorrhage, or mass lesion is present. The ventricles are proportionate to the degree of atrophy. No significant extra-axial fluid collection is present. The paranasal sinuses and mastoid air cells are clear.  Calvarium is intact. IMPRESSION: 1. Stable atrophy and white matter disease. 2. No acute intracranial abnormality. Electronically Signed   By: Marin Roberts M.D.   On: 06/26/2015 16:44   Dg Abd Acute W/chest  06/26/2015  CLINICAL DATA:  Altered mental status. Hallucinations for 2 days. No bowel movement for 5 days. EXAM: DG ABDOMEN ACUTE W/ 1V CHEST COMPARISON:  02/26/2015 FINDINGS: There is no evidence of dilated bowel loops or free intraperitoneal air. There is a large amount of stool in the ascending and descending colon and rectum. No radiopaque calculi or other significant radiographic abnormality is seen. Heart size and mediastinal contours are within normal limits. Both lungs are clear. IMPRESSION: Large amount of stool in the ascending and descending colon and rectum. No acute cardiopulmonary disease. Electronically Signed   By: Elige Ko   On: 06/26/2015 16:21     ASSESSMENT AND PLAN:   Eiliyah Reh is a 54 y.o. female with a known history of ALS, chronic Foley catheter is brought to the emergency room by husband due to hallucinations, weakness and confusion. Patient does have psychiatric issues and generally gets agitated due to those. 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 Was treated with fosfomycin in October and responded.  on Invanz daily Added IV daptomycin Urine cultures have been sent. Consult infectious disease.with dr fitzgerald appreciated.po fosphomycin at d/c  *hypotesnion  Suspected due to UTI and poor po intake -improved with IVF  * Bipolar disorder with psychosis Resume home meds -pt much calm  * ALS Patient is bedbound at baseline.  * Constipation Patient takes daily Colace and lactulose which will be continued. - will order fleets enema and as  needed suppository.  * DVT prophylaxis with Lovenox.  Will d/c to home in am if remains stable Spoke with husband in the room  Case discussed with Care Management/Social Worker. Management plans discussed with the patient, family and they are in agreement.  CODE STATUS: partilal  TOTAL critical TIME TAKING CARE OF THIS PATIENT: 30 minutes.  >50% time spent on counselling and coordination of care  POSSIBLE D/C IN 1-2 DAYS, DEPENDING ON CLINICAL CONDITION.   Stacy Deshler M.D on 06/27/2015 at 1:36 PM  Between 7am to 6pm - Pager - [phone]  After 6pm go to [email or web address] - password [email or web address]  [place]  [facility]  Office  [phone]  CC: Primary care physician; Ruthe Mannan, MD

## 2015-06-27 NOTE — Progress Notes (Signed)
Bolus 250 NS ordered for low blood pressure Per Dr. Enedina FinnerSona Patel

## 2015-06-27 NOTE — Progress Notes (Signed)
Patient blood pressure has not improved.  Notified Dr. Enedina FinnerSona Patel, advised to transfer to ICU for Levophed drip.  Patient family discussed over phone with Dr. Allena KatzPatel.  [facility] to [facility]

## 2015-06-27 NOTE — Progress Notes (Signed)
Called by RN about SBP in the 70's Gave bolus of 250 cc and still no improvement. spok e with husband. Will move to the unit for IV pressors and close BP monitoring Cont IV invanz. Await UC Spoke with dr fitzgerald

## 2015-06-27 NOTE — Progress Notes (Signed)
Rechecked blood pressure following bolus and no improvement noted.  Paged Dr. Enedina FinnerSona Patel to advise.

## 2015-06-27 NOTE — Care Management Note (Signed)
Case Management Note  Patient Details  Name: Jennifer [name] MRN: [id number] Date of Birth: 04-05-1961  Subjective/Objective:                  Met with patient's husband and caregiver. Patient was slouched over on her left side laying on pillow asleep. Husband expressed concern with patient's blood pressure being low and "that bolus did not work". Dr. Fritzi Mandes is aware and waiting on new BP reading. Patient is in the process of transferring to ICU.  He states that he has been caring for her in the home with hired caregivers. She is open to [facility] for SN and CNA.  Action/Plan:  Husband states that she is able to ride in a car to home. He is happy with [facility] and would like to continue current services. Corene Cornea with [facility] notified of patient admission.   Expected Discharge Date:  06/27/15               Expected Discharge Plan:     In-House Referral:     Discharge planning Services  CM Consult  Post Acute Care Choice:  Home Health Choice offered to:  Patient, Spouse  DME Arranged:    DME Agency:     HH Arranged:  RN, PT [place] Agency:  [facility]  Status of Service:  In process, will continue to follow  Medicare Important Message Given:    Date Medicare IM Given:    Medicare IM give by:    Date Additional Medicare IM Given:    Additional Medicare Important Message give by:     If discussed at [place] of Stay Meetings, dates discussed:    Additional Comments:  [name], RN 06/27/2015, 10:46 AM

## 2015-06-28 ENCOUNTER — Encounter: Payer: Self-pay | Admitting: Family Medicine

## 2015-06-28 LAB — BASIC METABOLIC PANEL
Anion gap: 4 — ABNORMAL LOW (ref 5–15)
CHLORIDE: 112 mmol/L — AB (ref 101–111)
CO2: 21 mmol/L — AB (ref 22–32)
CREATININE: 0.47 mg/dL (ref 0.44–1.00)
Calcium: 8.7 mg/dL — ABNORMAL LOW (ref 8.9–10.3)
GFR calc Af Amer: >60 mL/min (ref >60)
GFR calc non Af Amer: >60 mL/min (ref >60)
Glucose, Bld: 78 mg/dL (ref 65–99)
Potassium: 5.6 mmol/L — ABNORMAL HIGH (ref 3.5–5.1)
SODIUM: 137 mmol/L (ref 135–145)

## 2015-06-28 MED ORDER — DIPHENHYDRAMINE HCL 25 MG PO CAPS: 25.0000 mg | ORAL_CAPSULE | Freq: Every evening | ORAL | Status: DC | PRN

## 2015-06-28 MED ORDER — IBUPROFEN 400 MG PO TABS
800.0000 mg | ORAL_TABLET | Freq: Once | ORAL | Status: AC
  Administered 2015-06-28: 800 mg via ORAL
  Filled 2015-06-28: qty 2

## 2015-06-28 MED ORDER — SODIUM CHLORIDE 0.9 % IV SOLN
250.0000 mg | INTRAVENOUS | Status: DC
  Filled 2015-06-28: qty 5

## 2015-06-28 NOTE — Progress Notes (Signed)
Dr. Allena KatzPatel called and informed of pt requesting valium 2 hrs before next scheduled dose. Dr. Allena KatzPatel acknowledged requested and gave verbal orders to "go ahead and give it"

## 2015-06-28 NOTE — Consult Note (Signed)
ANTIBIOTIC CONSULT NOTE - INITIAL  Pharmacy Consult for Daptomycin Indication: Bacteremia/Sepsis/Prior VRE   Patient Measurements: Height: 4\' 11"  (149.9 cm) Weight: 95 lb 3.8 oz (43.2 kg) IBW/kg (Calculated) : 43.2 Vital Signs: Temp: 97.6 F (36.4 C) (12/02 0800) Temp Source: Oral (12/02 0800) BP: 125/94 mmHg (12/02 1200) Pulse Rate: 28 (12/02 1100)  Recent Labs  06/26/15 1653 06/27/15 0536 06/28/15 1108  WBC 6.5 5.9  --   HGB 11.8* 10.7*  --   PLT 197 186  --   CREATININE 0.67 0.60 0.47   Estimated Creatinine Clearance: 54.8 mL/min (by C-G formula based on Cr of 0.47).  Microbiology: Recent Results (from the past 720 hour(s))  Urine culture     Status: None   Collection Time: 06/26/15  4:54 PM  Result Value Ref Range Status   Specimen Description URINE, CATHETERIZED  Final   Special Requests NONE  Final   Culture MULTIPLE SPECIES PRESENT, SUGGEST RECOLLECTION  Final   Report Status 06/27/2015 FINAL  Final  Blood culture (routine x 2)     Status: None (Preliminary result)   Collection Time: 06/26/15  6:51 PM  Result Value Ref Range Status   Specimen Description BLOOD RIGHT HAND  Final   Special Requests BOTTLES DRAWN AEROBIC AND ANAEROBIC 5CC  Final   Culture NO GROWTH < 12 HOURS  Final   Report Status PENDING  Incomplete  Blood culture (routine x 2)     Status: None (Preliminary result)   Collection Time: 06/26/15  6:51 PM  Result Value Ref Range Status   Specimen Description BLOOD LEFT HAND  Final   Special Requests BOTTLES DRAWN AEROBIC AND ANAEROBIC 5CC  Final   Culture NO GROWTH < 12 HOURS  Final   Report Status PENDING  Incomplete  MRSA PCR Screening     Status: None   Collection Time: 06/27/15 11:42 AM  Result Value Ref Range Status   MRSA by PCR NEGATIVE NEGATIVE Final    Comment:        The GeneXpert MRSA Assay (FDA approved for NASAL specimens only), is one component of a comprehensive MRSA colonization surveillance program. It is not intended to  diagnose MRSA infection nor to guide or monitor treatment for MRSA infections.     Assessment: 54 yo patient with ALS, indwelling catheter and PMH of VRE UTI. Pharmacy has been consulted per ID for daptomycin dosing for Bacteremia/Sepsis/VRE UTI. Patient is also receiving Ertapenem IV for possible ESBL UTI.   CrCl:8855ml/min   Scr: 0.47  BC X2: NG x12 hours. No updates  UC: multiple species present. Repeat sent. Still pending  Baseline CK: 48  Plan:  Patient weight slightly increased, so will change dose to Daptomycin 250mg  daily (6mg  x 43 kg) from 240mg  daily. Recommend monitioring CPK weekly while on Daptomycin. Pharmacy will continue to monitor renal function, labs and susceptibilities per consult.  Cher NakaiSheema Baileigh Modisette, PharmD Pharmacy Resident  06/28/2015,1:55 PM

## 2015-06-28 NOTE — Consult Note (Signed)
[facility] Infectious Disease     Reason for Consult: UTI    Referring Physician: Nicholes Mango Date of Admission:  06/26/2015   Active Problems:   UTI (lower urinary tract infection)   HPI: Jennifer [name] is a 54 y.o. female with ALS, chronic foley and recurrent UTIs admitted with hallucinations and confusion. She has had UTI dx in Oct and treated with Fosfomycin. On admission had wbc 6.5, and was afebrile. Her UA showed TNTC WBC. She initially was started on IV ertapenem but developed hypotension requiring transfer to ICU. IV daptomycin added at that time.  Has stabilized and been trxfered out of ICU. She is planned for SP cath plcement next week    Past Medical History  Diagnosis Date  . ALS (amyotrophic lateral sclerosis) ([employer])   . Depression   . GERD (gastroesophageal reflux disease)   . Urinary retention   . Dysphagia, oropharyngeal 11/28/2013  . CN (constipation) 11/28/2013   Past Surgical History  Procedure Laterality Date  . Cesarean section    . Nasal sinus surgery     Social History  Substance Use Topics  . Smoking status: Former Research scientist (life sciences)  . Smokeless tobacco: Never Used  . Alcohol Use: No   Family History  Problem Relation Age of Onset  . COPD Mother   . Thyroid disease Mother   . Diverticulosis Mother   . Heart attack Mother   . Gout Father   . Depression Sister   . Thyroid disease Sister   . Cancer Brother     kidney  . Heart attack Maternal Grandmother   . Heart attack Maternal Grandfather   . Heart attack Paternal Grandfather   . Hyperlipidemia Sister   . Yves Dill Parkinson White syndrome Sister 25  . Depression Brother   . Hypertension Brother     Allergies:  Allergies  Allergen Reactions  . Bee Venom Anaphylaxis  . Hydrocodone Other (See Comments)    Reaction:  Muscle stiffness   . Lipitor [Atorvastatin] Other (See Comments)    Reaction:  Muscle cramps   . Shellfish Allergy Anaphylaxis  . Oxycodone Other (See Comments)    Reaction:   Muscle stiffness   . Prednisone Other (See Comments)    Reaction:  Psychosis   . Vicodin [Hydrocodone-Acetaminophen] Other (See Comments)    Reaction:  Muscle stiffness   . Oxycontin [Oxycodone Hcl] Other (See Comments)    Reaction:  Muscle stiffness   . Peanut-Containing Drug Products Diarrhea    Current antibiotics: Antibiotics Given (last 72 hours)    Date/Time Action Medication Dose Rate   06/26/15 2035 Given   ertapenem (INVANZ) 1 g in sodium chloride 0.9 % 50 mL IVPB 1 g 100 mL/hr   06/27/15 1447 Given   DAPTOmycin (CUBICIN) 240 mg in sodium chloride 0.9 % IVPB 240 mg 209.6 mL/hr   06/27/15 1946 Given   ertapenem (INVANZ) 1 g in sodium chloride 0.9 % 50 mL IVPB 1 g 100 mL/hr   06/28/15 1355 Given   DAPTOmycin (CUBICIN) 240 mg in sodium chloride 0.9 % IVPB 240 mg 209.6 mL/hr      MEDICATIONS: . ARIPiprazole  2.5 mg Oral BID  . buPROPion  300 mg Oral BID  . [START ON 06/29/2015] DAPTOmycin (CUBICIN)  IV  250 mg Intravenous Q24H  . Dextromethorphan-Quinidine  1 capsule Oral BID  . enoxaparin (LOVENOX) injection  30 mg Subcutaneous Q24H  . ertapenem  1 g Intravenous Q24H  . famotidine  20 mg Oral BID  .  lactulose  30 g Oral Daily  . levETIRAcetam  500 mg Oral BID  . senna  1 tablet Oral BID  . tiZANidine  2 mg Oral QHS    Review of Systems - 11 systems reviewed and negative per HPI   OBJECTIVE: Temp:  [97.6 F (36.4 C)-98.6 F (37 C)] 97.6 F (36.4 C) (12/02 0800) Pulse Rate:  [28-106] 100 (12/02 1515) Resp:  [13-26] 20 (12/02 1515) BP: (81-138)/(57-94) 120/79 mmHg (12/02 1515) SpO2:  [95 %-100 %] 95 % (12/02 1515) Weight:  [43.2 kg (95 lb 3.8 oz)] 43.2 kg (95 lb 3.8 oz) (12/02 0346)  GENERAL: 54 y.o.-year-old patient lying in the bed with no acute distress. Drowsy EYES: Pupils equal, round, reactive to light and accommodation. No scleral icterus. Extraocular muscles intact.  HEENT: Head atraumatic, normocephalic. Oropharynx and nasopharynx clear. No  oropharyngeal erythema, moist oral mucosa  NECK: Supple, no jugular venous distention. No thyroid enlargement, no tenderness.  LUNGS: Normal breath sounds bilaterally, no wheezing, rales, rhonchi. No use of accessory muscles of respiration.  CARDIOVASCULAR: S1, S2 normal. No murmurs, rubs, or gallops.  ABDOMEN: Soft, nontender, nondistended. Bowel sounds present. No organomegaly or mass. Foley catheter in place with clear urine EXTREMITIES: No pedal edema, cyanosis, or clubbing. + 2 pedal & radial pulses b/l.  NEUROLOGIC: Slumped over to the left side. Not following instructions. PSYCHIATRIC: The patient is drowzy Foley - clear urine  LABS: Results for orders placed or performed during the hospital encounter of 06/26/15 (from the past 48 hour(s))  CBC with Differential     Status: Abnormal   Collection Time: 06/26/15  4:53 PM  Result Value Ref Range   WBC 6.5 3.6 - 11.0 K/uL   RBC 3.66 (L) 3.80 - 5.20 MIL/uL   Hemoglobin 11.8 (L) 12.0 - 16.0 g/dL   HCT 34.2 (L) 35.0 - 47.0 %   MCV 93.4 80.0 - 100.0 fL   MCH 32.2 26.0 - 34.0 pg   MCHC 34.5 32.0 - 36.0 g/dL   RDW 12.8 11.5 - 14.5 %   Platelets 197 150 - 440 K/uL   Neutrophils Relative % 70 %   Neutro Abs 4.6 1.4 - 6.5 K/uL   Lymphocytes Relative 23 %   Lymphs Abs 1.5 1.0 - 3.6 K/uL   Monocytes Relative 4 %   Monocytes Absolute 0.3 0.2 - 0.9 K/uL   Eosinophils Relative 2 %   Eosinophils Absolute 0.1 0 - 0.7 K/uL   Basophils Relative 1 %   Basophils Absolute 0.1 0 - 0.1 K/uL  Comprehensive metabolic panel     Status: Abnormal   Collection Time: 06/26/15  4:53 PM  Result Value Ref Range   Sodium 136 135 - 145 mmol/L   Potassium 2.8 (LL) 3.5 - 5.1 mmol/L    Comment: CRITICAL RESULT CALLED TO, READ BACK BY AND VERIFIED WITH KENDALL MOFFITT AT 1727 06/26/15 MLZ    Chloride 105 101 - 111 mmol/L   CO2 21 (L) 22 - 32 mmol/L   Glucose, Bld 110 (H) 65 - 99 mg/dL   BUN 10 6 - 20 mg/dL   Creatinine, Ser 0.67 0.44 - 1.00 mg/dL    Calcium 9.5 8.9 - 10.3 mg/dL   Total Protein 6.4 (L) 6.5 - 8.1 g/dL   Albumin 3.6 3.5 - 5.0 g/dL   AST 20 15 - 41 U/L   ALT 20 14 - 54 U/L   Alkaline Phosphatase 56 38 - 126 U/L   Total Bilirubin 0.7 0.3 -  1.2 mg/dL   GFR calc non Af Amer >60 >60 mL/min   GFR calc Af Amer >60 >60 mL/min    Comment: (NOTE) The eGFR has been calculated using the CKD EPI equation. This calculation has not been validated in all clinical situations. eGFR's persistently <60 mL/min signify possible Chronic Kidney Disease.    Anion gap 10 5 - 15  Ammonia     Status: None   Collection Time: 06/26/15  4:53 PM  Result Value Ref Range   Ammonia 26 9 - 35 umol/L  Lipase, blood     Status: None   Collection Time: 06/26/15  4:53 PM  Result Value Ref Range   Lipase 29 11 - 51 U/L  Urinalysis complete, with microscopic ([facility] only)     Status: Abnormal   Collection Time: 06/26/15  4:54 PM  Result Value Ref Range   Color, Urine YELLOW (A) YELLOW   APPearance HAZY (A) CLEAR   Glucose, UA NEGATIVE NEGATIVE mg/dL   Bilirubin Urine NEGATIVE NEGATIVE   Ketones, ur NEGATIVE NEGATIVE mg/dL   Specific Gravity, Urine 1.015 1.005 - 1.030   Hgb urine dipstick NEGATIVE NEGATIVE   pH 6.0 5.0 - 8.0   Protein, ur 30 (A) NEGATIVE mg/dL   Nitrite POSITIVE (A) NEGATIVE   Leukocytes, UA 2+ (A) NEGATIVE   RBC / HPF 6-30 0 - 5 RBC/hpf   WBC, UA TOO NUMEROUS TO COUNT 0 - 5 WBC/hpf   Bacteria, UA MANY (A) NONE SEEN   Squamous Epithelial / LPF 0-5 (A) NONE SEEN   Mucous PRESENT    Ca Oxalate Crys, UA PRESENT   Urine culture     Status: None   Collection Time: 06/26/15  4:54 PM  Result Value Ref Range   Specimen Description URINE, CATHETERIZED    Special Requests NONE    Culture MULTIPLE SPECIES PRESENT, SUGGEST RECOLLECTION    Report Status 06/27/2015 FINAL   Blood culture (routine x 2)     Status: None (Preliminary result)   Collection Time: 06/26/15  6:51 PM  Result Value Ref Range   Specimen Description BLOOD RIGHT  HAND    Special Requests BOTTLES DRAWN AEROBIC AND ANAEROBIC 5CC    Culture NO GROWTH < 12 HOURS    Report Status PENDING   Blood culture (routine x 2)     Status: None (Preliminary result)   Collection Time: 06/26/15  6:51 PM  Result Value Ref Range   Specimen Description BLOOD LEFT HAND    Special Requests BOTTLES DRAWN AEROBIC AND ANAEROBIC 5CC    Culture NO GROWTH < 12 HOURS    Report Status PENDING   Basic metabolic panel     Status: Abnormal   Collection Time: 06/27/15  5:36 AM  Result Value Ref Range   Sodium 139 135 - 145 mmol/L   Potassium 3.3 (L) 3.5 - 5.1 mmol/L   Chloride 110 101 - 111 mmol/L   CO2 21 (L) 22 - 32 mmol/L   Glucose, Bld 78 65 - 99 mg/dL   BUN 8 6 - 20 mg/dL   Creatinine, Ser 0.60 0.44 - 1.00 mg/dL   Calcium 8.5 (L) 8.9 - 10.3 mg/dL   GFR calc non Af Amer >60 >60 mL/min   GFR calc Af Amer >60 >60 mL/min    Comment: (NOTE) The eGFR has been calculated using the CKD EPI equation. This calculation has not been validated in all clinical situations. eGFR's persistently <60 mL/min signify possible Chronic Kidney Disease.  Anion gap 8 5 - 15  CBC     Status: Abnormal   Collection Time: 06/27/15  5:36 AM  Result Value Ref Range   WBC 5.9 3.6 - 11.0 K/uL   RBC 3.36 (L) 3.80 - 5.20 MIL/uL   Hemoglobin 10.7 (L) 12.0 - 16.0 g/dL   HCT 31.8 (L) 35.0 - 47.0 %   MCV 94.6 80.0 - 100.0 fL   MCH 31.8 26.0 - 34.0 pg   MCHC 33.6 32.0 - 36.0 g/dL   RDW 12.6 11.5 - 14.5 %   Platelets 186 150 - 440 K/uL  MRSA PCR Screening     Status: None   Collection Time: 06/27/15 11:42 AM  Result Value Ref Range   MRSA by PCR NEGATIVE NEGATIVE    Comment:        The GeneXpert MRSA Assay (FDA approved for NASAL specimens only), is one component of a comprehensive MRSA colonization surveillance program. It is not intended to diagnose MRSA infection nor to guide or monitor treatment for MRSA infections.   CK     Status: None   Collection Time: 06/27/15  1:15 PM   Result Value Ref Range   Total CK 48 38 - 234 U/L  Basic metabolic panel     Status: Abnormal   Collection Time: 06/28/15 11:08 AM  Result Value Ref Range   Sodium 137 135 - 145 mmol/L   Potassium 5.6 (H) 3.5 - 5.1 mmol/L   Chloride 112 (H) 101 - 111 mmol/L   CO2 21 (L) 22 - 32 mmol/L   Glucose, Bld 78 65 - 99 mg/dL   BUN <5 (L) 6 - 20 mg/dL   Creatinine, Ser 0.47 0.44 - 1.00 mg/dL   Calcium 8.7 (L) 8.9 - 10.3 mg/dL   GFR calc non Af Amer >60 >60 mL/min   GFR calc Af Amer >60 >60 mL/min    Comment: (NOTE) The eGFR has been calculated using the CKD EPI equation. This calculation has not been validated in all clinical situations. eGFR's persistently <60 mL/min signify possible Chronic Kidney Disease.    Anion gap 4 (L) 5 - 15   No components found for: ESR, C REACTIVE PROTEIN MICRO: Recent Results (from the past 720 hour(s))  Urine culture     Status: None   Collection Time: 06/26/15  4:54 PM  Result Value Ref Range Status   Specimen Description URINE, CATHETERIZED  Final   Special Requests NONE  Final   Culture MULTIPLE SPECIES PRESENT, SUGGEST RECOLLECTION  Final   Report Status 06/27/2015 FINAL  Final  Blood culture (routine x 2)     Status: None (Preliminary result)   Collection Time: 06/26/15  6:51 PM  Result Value Ref Range Status   Specimen Description BLOOD RIGHT HAND  Final   Special Requests BOTTLES DRAWN AEROBIC AND ANAEROBIC 5CC  Final   Culture NO GROWTH < 12 HOURS  Final   Report Status PENDING  Incomplete  Blood culture (routine x 2)     Status: None (Preliminary result)   Collection Time: 06/26/15  6:51 PM  Result Value Ref Range Status   Specimen Description BLOOD LEFT HAND  Final   Special Requests BOTTLES DRAWN AEROBIC AND ANAEROBIC 5CC  Final   Culture NO GROWTH < 12 HOURS  Final   Report Status PENDING  Incomplete  MRSA PCR Screening     Status: None   Collection Time: 06/27/15 11:42 AM  Result Value Ref Range Status   MRSA by  PCR NEGATIVE  NEGATIVE Final    Comment:        The GeneXpert MRSA Assay (FDA approved for NASAL specimens only), is one component of a comprehensive MRSA colonization surveillance program. It is not intended to diagnose MRSA infection nor to guide or monitor treatment for MRSA infections.    02/2015 Specimen Description URINE, RANDOM   Special Requests NONE   Culture 50,000 COLONIES/mL VANCOMYCIN RESISTANT ENTEROCOCCUS  CRITICAL RESULT CALLED TO, READ BACK BY AND VERIFIED WITH:  Vernon Prey 03/24/2015 0956 BY JRS.  >=100,000 COLONIES/mL AEROCOCCUS URINAE       Report Status 03/24/2015 FINAL   Organism ID, Bacteria VANCOMYCIN RESISTANT ENTEROCOCCUS   Resulting Agency [facility]    Culture & Susceptibility      VANCOMYCIN RESISTANT ENTEROCOCCUS     Antibiotic Sensitivity Microscan Status    AMPICILLIN Resistant >=32 RESISTANT Final    Method: MIC    CIPROFLOXACIN Resistant RESISTANT >=8 Final    Method: MIC    GENTAMICIN SYNERGY Sensitive SENSITIVE S Final    Method: MIC    LEVOFLOXACIN Resistant RESISTANT >=8 Final    Method: MIC    LINEZOLID Sensitive 2 SENSITIVE Final    Method: MIC    TETRACYCLINE Resistant RESISTANT >=16            IMAGING: Ct Head Wo Contrast  06/26/2015  CLINICAL DATA:  Hallucinations. Urinary tract infection. Altered mental status. EXAM: CT HEAD WITHOUT CONTRAST TECHNIQUE: Contiguous axial images were obtained from the base of the skull through the vertex without intravenous contrast. COMPARISON:  CT head without contrast 01/25/2015. FINDINGS: Mild atrophy and white matter disease is stable. No acute cortical infarct, hemorrhage, or mass lesion is present. The ventricles are proportionate to the degree of atrophy. No significant extra-axial fluid collection is present. The paranasal sinuses and mastoid air cells are clear. Calvarium is intact. IMPRESSION: 1. Stable atrophy and white matter disease. 2. No acute  intracranial abnormality. Electronically Signed   By: San Morelle M.D.   On: 06/26/2015 16:44   Dg Abd Acute W/chest  06/26/2015  CLINICAL DATA:  Altered mental status. Hallucinations for 2 days. No bowel movement for 5 days. EXAM: DG ABDOMEN ACUTE W/ 1V CHEST COMPARISON:  02/26/2015 FINDINGS: There is no evidence of dilated bowel loops or free intraperitoneal air. There is a large amount of stool in the ascending and descending colon and rectum. No radiopaque calculi or other significant radiographic abnormality is seen. Heart size and mediastinal contours are within normal limits. Both lungs are clear. IMPRESSION: Large amount of stool in the ascending and descending colon and rectum. No acute cardiopulmonary disease. Electronically Signed   By: Kathreen Devoid   On: 06/26/2015 16:21    Assessment:   Akayla Brass is a 55 y.o. female with ALS, chronic foley and recurrent UTIs, with recent VRE and also mixed cultures. Has responded to fosfomycin in past. Currently on dapto and ertapenem. Initial UA TNTC wbc and UCX mixed (as often expected with chronic foley). Cath changed yesterday and new sample sent and pending.  Clincially improving  Recommendations Fu ucx sent 12/1 If no definitive result would dc on fosfomycin 3 gm q 2 days x 5 doses.   Agree with SPC placement next week if infection free at that time. Has never been on suppressive abx per husband - could consider.   Thank you very much for allowing me to participate in the care of this patient. Please call with questions.   Cheral Marker.  Ola Spurr, MD

## 2015-06-28 NOTE — Progress Notes (Signed)
Patient transferred from CCU. Maclain Cohron S, RN  

## 2015-06-28 NOTE — Progress Notes (Signed)
Report called to receiving RN.

## 2015-06-28 NOTE — Consult Note (Signed)
PARENTERAL NUTRITION CONSULT NOTE - INITIAL  Pharmacy Consult for Potassium Replacement/Management  Patient Measurements: Height: 4\' 11"  (149.9 cm) Weight: 95 lb 3.8 oz (43.2 kg) IBW/kg (Calculated) : 43.2  Vital Signs: Temp: 97.6 F (36.4 C) (12/02 0800) Temp Source: Oral (12/02 0800) BP: 125/94 mmHg (12/02 1200) Pulse Rate: 28 (12/02 1100)  Recent Labs  06/26/15 1653 06/27/15 0536  WBC 6.5 5.9  HGB 11.8* 10.7*  HCT 34.2* 31.8*  PLT 197 186    Recent Labs  06/26/15 1653 06/27/15 0536 06/28/15 1108  NA 136 139 137  K 2.8* 3.3* 5.6*  CL 105 110 112*  CO2 21* 21* 21*  GLUCOSE 110* 78 78  BUN 10 8 <5*  CREATININE 0.67 0.60 0.47  CALCIUM 9.5 8.5* 8.7*  PROT 6.4*  --   --   ALBUMIN 3.6  --   --   AST 20  --   --   ALT 20  --   --   ALKPHOS 56  --   --   BILITOT 0.7  --   --    Estimated Creatinine Clearance: 54.8 mL/min (by C-G formula based on Cr of 0.47).   No results for input(s): GLUCAP in the last 72 hours.  Assessment: 54 yo patient with ALS, indwelling catheter and PMH of VRE UTI. Pharmacy consulted for potassium replacement.  Patients potassium at 1108 today 5.6.  Plan:  Due to patients elevated potassium level, discontinued oral liquid potassium order and recommended discontinuation of IV fluids containing potassium. Fluids have been discontinued and no replacement is needed at this time.  Pharmacy will continue to monitor potassium daily and replace as needed.   Cher NakaiSheema Jaselynn Tamas, PharmD Pharmacy Resident 06/28/2015 1:54 PM

## 2015-06-28 NOTE — Clinical Documentation Improvement (Signed)
Internal Medicine  Abnormal Lab/Test Results:  06/26/15: K= 2.8; 06/27/15: K= 3.3  Possible Clinical Conditions associated with below indicators  Hypokalemia  Other Condition  Cannot Clinically Determine  Treatment Provided: * NS with 40 mEq/liter @ 100 ml/hr * KCL 20 mEq/15 ml po daily   Please exercise your independent, professional judgment when responding. A specific answer is not anticipated or expected.   Thank You,  Cherylann Ratelonna J Corretta Munce , RN, BSN Health Information Management [place] [phone]

## 2015-06-29 LAB — POTASSIUM: Potassium: 4.4 mmol/L (ref 3.5–5.1)

## 2015-06-29 MED ORDER — FOSFOMYCIN TROMETHAMINE 3 G PO PACK: 3.0000 g | PACK | ORAL | Status: DC

## 2015-06-29 MED ORDER — FOSFOMYCIN TROMETHAMINE 3 G PO PACK
3.0000 g | PACK | ORAL | Status: DC
  Filled 2015-06-29: qty 3

## 2015-06-29 NOTE — Discharge Summary (Signed)
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 ALS (amyotrophic lateral sclerosis) (HCC)   . Depression   . GERD (gastroesophageal reflux disease)   . Urinary retention   . Dysphagia, oropharyngeal 11/28/2013  . CN (constipation) 11/28/2013    HOSPITAL COURSE:  Jennifer [name] is a 54 y.o. female with a known history of ALS, chronic Foley catheter is brought to the emergency room by husband due to hallucinations, weakness and confusion. Patient does have psychiatric issues and generally gets agitated due to those. 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 Was treated with fosfomycin in October and responded. on Invanz daily Added IV daptomycin by ID Urine cultures mixed species Consult infectious disease.with dr fitzgerald appreciated.po fosphomycin at d/c  *hypotesnion  Suspected due to UTI and poor po intake -improved with IVF  * Bipolar disorder with psychosis Resume home meds -pt much calm  * ALS Patient is bedbound at baseline.  * Constipation Patient takes daily Colace and lactulose which will be continued. - will order fleets enema and as needed suppository.  * DVT prophylaxis with Lovenox.  Will d/c to home itoday Spoke with husband in  the room  CONSULTS OBTAINED:  Treatment Team:  Clydie Braun, MD Enedina Finner, MD  DRUG ALLERGIES:   Allergies  Allergen Reactions  . Bee Venom Anaphylaxis  . Hydrocodone Other (See Comments)    Reaction:  Muscle stiffness   . Lipitor [Atorvastatin] Other (See Comments)    Reaction:  Muscle cramps   . Shellfish Allergy Anaphylaxis  . Oxycodone Other (See Comments)    Reaction:  Muscle stiffness   . Prednisone Other (See Comments)    Reaction:  Psychosis   . Vicodin [Hydrocodone-Acetaminophen] Other (See Comments)    Reaction:  Muscle stiffness   . Oxycontin [Oxycodone Hcl] Other (See Comments)    Reaction:  Muscle stiffness   . Peanut-Containing Drug Products Diarrhea    DISCHARGE MEDICATIONS:   Current Discharge Medication List    CONTINUE these medications which have CHANGED   Details  fosfomycin (MONUROL) 3 G PACK Take 3 g by mouth every other day. Qty: 15 g, Refills: 0      CONTINUE these medications which have NOT CHANGED   Details  ARIPiprazole (ABILIFY) 5 MG tablet Take 2.5 mg by mouth 2 (two) times daily.     atropine 1 % ophthalmic solution Place 2 drops under the tongue as needed (for excessive secretions).    buPROPion (WELLBUTRIN SR) 150 MG 12 hr tablet Take 300 mg by mouth 2 (two) times daily.    cholecalciferol (VITAMIN D) 1000 UNITS tablet Take 1,000 Units by mouth daily.    Dextromethorphan-Quinidine (NUEDEXTA) 20-10 MG CAPS Take 1 capsule by mouth 2 (two) times daily.     diazepam (VALIUM) 10 MG tablet Take  1 tablet (10 mg total) by mouth every 6 (six) hours as needed for anxiety. Qty: 60 tablet, Refills: 0    fluticasone (FLONASE) 50 MCG/ACT nasal spray Place 2 sprays into both nostrils daily as needed for rhinitis.    ibuprofen (ADVIL,MOTRIN) 200 MG tablet Take 800 mg by mouth 3 (three) times daily as needed for mild pain.     lactulose (CHRONULAC) 10 GM/15ML solution Take 15 mLs (10 g total) by mouth 3 (three) times daily. Qty: 240 mL,  Refills: 0    levETIRAcetam (KEPPRA) 500 MG tablet Take 500 mg by mouth 2 (two) times daily.     loratadine (CLARITIN) 10 MG tablet Take 10 mg by mouth daily as needed for allergies.     omeprazole (PRILOSEC) 40 MG capsule Take 40 mg by mouth daily.     potassium chloride 20 MEQ/15ML (10%) SOLN Take 20 mEq by mouth daily.    ranitidine (ZANTAC) 150 MG tablet Take 150 mg by mouth daily as needed for heartburn.    tizanidine (ZANAFLEX) 2 MG capsule Take 2 mg by mouth at bedtime.     benzonatate (TESSALON) 100 MG capsule Take 1 capsule (100 mg total) by mouth 3 (three) times daily as needed for cough. Qty: 20 capsule, Refills: 0      STOP taking these medications     sulfamethoxazole-trimethoprim (BACTRIM DS,SEPTRA DS) 800-160 MG tablet         If you experience worsening of your admission symptoms, develop shortness of breath, life threatening emergency, suicidal or homicidal thoughts you must seek medical attention immediately by calling 911 or calling your MD immediately  if symptoms less severe.  You Must read complete instructions/literature along with all the possible adverse reactions/side effects for all the Medicines you take and that have been prescribed to you. Take any new Medicines after you have completely understood and accept all the possible adverse reactions/side effects.   Please note  You were cared for by a hospitalist during your hospital stay. If you have any questions about your discharge medications or the care you received while you were in the hospital after you are discharged, you can call the unit and asked to speak with the hospitalist on call if the hospitalist that took care of you is not available. Once you are discharged, your primary care physician will handle any further medical issues. Please note that NO REFILLS for any discharge medications will be authorized once you are discharged, as it is imperative that you return to your primary care physician  (or establish a relationship with a primary care physician if you do not have one) for your aftercare needs so that they can reassess your need for medications and monitor your lab values.  DATA REVIEW:   CBC   Recent Labs Lab 06/27/15 0536  WBC 5.9  HGB 10.7*  HCT 31.8*  PLT 186    Chemistries   Recent Labs Lab 06/26/15 1653  06/28/15 1108 06/29/15 1105  NA 136  < > 137  --   K 2.8*  < > 5.6* 4.4  CL 105  < > 112*  --   CO2 21*  < > 21*  --   GLUCOSE 110*  < > 78  --   BUN 10  < > <5*  --   CREATININE 0.67  < > 0.47  --   CALCIUM 9.5  < > 8.7*  --   AST 20  --   --   --  ALT 20  --   --   --   ALKPHOS 56  --   --   --   BILITOT 0.7  --   --   --   < > = values in this interval not displayed.  Microbiology Results   Recent Results (from the past 240 hour(s))  Urine culture     Status: None   Collection Time: 06/26/15  4:54 PM  Result Value Ref Range Status   Specimen Description URINE, CATHETERIZED  Final   Special Requests NONE  Final   Culture MULTIPLE SPECIES PRESENT, SUGGEST RECOLLECTION  Final   Report Status 06/27/2015 FINAL  Final  Blood culture (routine x 2)     Status: None (Preliminary result)   Collection Time: 06/26/15  6:51 PM  Result Value Ref Range Status   Specimen Description BLOOD RIGHT HAND  Final   Special Requests BOTTLES DRAWN AEROBIC AND ANAEROBIC 5CC  Final   Culture NO GROWTH 3 DAYS  Final   Report Status PENDING  Incomplete  Blood culture (routine x 2)     Status: None (Preliminary result)   Collection Time: 06/26/15  6:51 PM  Result Value Ref Range Status   Specimen Description BLOOD LEFT HAND  Final   Special Requests BOTTLES DRAWN AEROBIC AND ANAEROBIC 5CC  Final   Culture NO GROWTH 3 DAYS  Final   Report Status PENDING  Incomplete  MRSA PCR Screening     Status: None   Collection Time: 06/27/15 11:42 AM  Result Value Ref Range Status   MRSA by PCR NEGATIVE NEGATIVE Final    Comment:        The GeneXpert MRSA Assay  (FDA approved for NASAL specimens only), is one component of a comprehensive MRSA colonization surveillance program. It is not intended to diagnose MRSA infection nor to guide or monitor treatment for MRSA infections.     RADIOLOGY:  No results found.   Management plans discussed with the patient, family and they are in agreement.  CODE STATUS:     Code Status Orders        Start     Ordered   06/26/15 1901  Limited resuscitation (code)   Continuous    Question Answer Comment  In the event of cardiac or respiratory ARREST: Initiate Code Blue, Call Rapid Response Yes   In the event of cardiac or respiratory ARREST: Perform CPR Yes   In the event of cardiac or respiratory ARREST: Perform Intubation/Mechanical Ventilation No   In the event of cardiac or respiratory ARREST: Use NIPPV/BiPAp only if indicated Yes   In the event of cardiac or respiratory ARREST: Administer ACLS medications if indicated Yes   In the event of cardiac or respiratory ARREST: Perform Defibrillation or Cardioversion if indicated Yes      06/26/15 1902      TOTAL TIME TAKING CARE OF THIS PATIENT: 40 minutes.    Lynise Porr M.D on 06/29/2015 at 12:06 PM  Between 7am to 6pm - Pager - [phone] After 6pm go to [email or web address] - password [email or web address] [facility]  [place] [facility]  Office  [phone]  CC: Primary care physician; Ruthe Mannan, MD

## 2015-06-29 NOTE — Progress Notes (Addendum)
Patient discharged home per MD order.  IVs removed. Discharge packet reviewed with husband. Medications, prescriptions, follow up care, diet and activity reviewed with husband.  All questions answered. Husband verbalized understanding.  EMS to transport per family request.   After an hour since EMS was called, husband came out to nurses station and decided he wanted to take patient himself and did not want EMS. Husband stated he transports her all the time and has all the equipment she needs at home for them to use. Patient is followed by home health. Explained to husband EMS will be a safer transport. Husband said, "yea, it makes it easier for us, but I would like to transport her home now." He adamantly declined transport by EMS. Notified CSW. CSW understood request. EMS transport cancelled. Pt left via wheelchair with husband, daughter, caregiver and staff RN.

## 2015-06-29 NOTE — Plan of Care (Signed)
Problem: Bowel/Gastric: Goal: Will not experience complications related to bowel motility Outcome: Progressing Pt has been resting comfortably during shift. Pt denies pain. Pt BP is 90s/60s this am. No other signs of distress noted. Will continue to monitor.

## 2015-06-29 NOTE — Progress Notes (Signed)
Patient ID: Sula RumpleKatherine Baldassari, female   DOB: 05-10-1961, 54 y.o.   MRN: [id number] [facility] [place] [facility]   PATIENT NAME: Sula RumpleKatherine Kafer    MR#:  [id number]  DATE OF BIRTH:  05-10-1961  SUBJECTIVE:  Weak. Not able to understand. Husband in the room More alert and feels a lot better  REVIEW OF SYSTEMS:   Review of Systems  Constitutional: Negative for fever, chills and weight loss.  HENT: Negative for ear discharge, ear pain and nosebleeds.   Eyes: Negative for blurred vision, pain and discharge.  Respiratory: Negative for sputum production, shortness of breath, wheezing and stridor.   Cardiovascular: Negative for chest pain, palpitations, orthopnea and PND.  Gastrointestinal: Negative for nausea, vomiting, abdominal pain and diarrhea.  Genitourinary: Negative for urgency and frequency.  Musculoskeletal: Negative for back pain and joint pain.  Neurological: Negative for sensory change, speech change, focal weakness and weakness.  Psychiatric/Behavioral: Negative for depression and hallucinations. The patient is not nervous/anxious.    Tolerating Diet:yes Tolerating PT: bed bound due to ALS  DRUG ALLERGIES:   Allergies  Allergen Reactions  . Bee Venom Anaphylaxis  . Hydrocodone Other (See Comments)    Reaction:  Muscle stiffness   . Lipitor [Atorvastatin] Other (See Comments)    Reaction:  Muscle cramps   . Shellfish Allergy Anaphylaxis  . Oxycodone Other (See Comments)    Reaction:  Muscle stiffness   . Prednisone Other (See Comments)    Reaction:  Psychosis   . Vicodin [Hydrocodone-Acetaminophen] Other (See Comments)    Reaction:  Muscle stiffness   . Oxycontin [Oxycodone Hcl] Other (See Comments)    Reaction:  Muscle stiffness   . Peanut-Containing Drug Products Diarrhea    VITALS:  Blood pressure 110/68, pulse 88, temperature 98 F (36.7 C), temperature source Oral, resp. rate 17, height 4\' 11"  (1.499 m), weight 42.593 kg (93 lb  14.4 oz), SpO2 100 %.  PHYSICAL EXAMINATION:   Physical Exam  GENERAL:  54 y.o.-year-old patient lying in the bed with no acute distress. thin EYES: Pupils equal, round, reactive to light and accommodation. No scleral icterus. Extraocular muscles intact.  HEENT: Head atraumatic, normocephalic. Oropharynx and nasopharynx clear.  NECK:  Supple, no jugular venous distention. No thyroid enlargement, no tenderness.  LUNGS: Normal breath sounds bilaterally, no wheezing, rales, rhonchi. No use of accessory muscles of respiration.  CARDIOVASCULAR: S1, S2 normal. No murmurs, rubs, or gallops.  ABDOMEN: Soft, nontender, nondistended. Bowel sounds present. No organomegaly or mass. Chronic foley EXTREMITIES: No cyanosis, clubbing or edema b/l.    NEUROLOGIC: unable to assess due to severe contractures from ALS PSYCHIATRIC:  patient is alert  SKIN: No obvious rash, lesion, or ulcer.    LABORATORY PANEL:   CBC  Recent Labs Lab 06/27/15 0536  WBC 5.9  HGB 10.7*  HCT 31.8*  PLT 186    Chemistries   Recent Labs Lab 06/26/15 1653  06/28/15 1108 06/29/15 1105  NA 136  < > 137  --   K 2.8*  < > 5.6* 4.4  CL 105  < > 112*  --   CO2 21*  < > 21*  --   GLUCOSE 110*  < > 78  --   BUN 10  < > <5*  --   CREATININE 0.67  < > 0.47  --   CALCIUM 9.5  < > 8.7*  --   AST 20  --   --   --   ALT  20  --   --   --   ALKPHOS 56  --   --   --   BILITOT 0.7  --   --   --   < > = values in this interval not displayed.  Cardiac Enzymes No results for input(s): TROPONINI in the last 168 hours.  RADIOLOGY:  No results found.   ASSESSMENT AND PLAN:   Dayle Sherpa is a 54 y.o. female with a known history of ALS, chronic Foley catheter is brought to the emergency room by husband due to hallucinations, weakness and confusion. Patient does have psychiatric issues and generally gets agitated due to those. Patient has been treated with fosfomycin in October 2016 for a UTI which seem to have improved  her mentation  * UTI with acute encephalopathy, Indwelling catheter related Patient has recurrent UTI. 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 Was treated with fosfomycin in October and responded.  on Invanz daily Added IV daptomycin by ID Urine cultures mixed species Consult infectious disease.with dr fitzgerald appreciated.po fosphomycin at d/c  *hypotesnion  Suspected due to UTI and poor po intake -improved with IVF  * Bipolar disorder with psychosis Resume home meds -pt much calm  * ALS Patient is bedbound at baseline.  * Constipation Patient takes daily Colace and lactulose which will be continued. - will order fleets enema and as needed suppository.  * DVT prophylaxis with Lovenox.  Will d/c to home itoday Spoke with husband in the room  Case discussed with Care Management/Social Worker. Management plans discussed with the patient, family and they are in agreement.  CODE STATUS: partilal  TOTAL critical TIME TAKING CARE OF THIS PATIENT: 30 minutes.  >50% time spent on counselling and coordination of care  POSSIBLE D/C IN 1-2 DAYS, DEPENDING ON CLINICAL CONDITION.   Robbin Loughmiller M.D on 06/28/2015  Between 7am to 6pm - Pager - [phone]  After 6pm go to [email or web address] - password [email or web address] [facility]  [place] [facility]  Office  [phone]  CC: Primary care physician; Ruthe Mannan, MD

## 2015-06-29 NOTE — Progress Notes (Addendum)
Patient ID: Jennifer [name], female   DOB: 26-Oct-1960, 54 y.o.   MRN: [id number] [facility] - [place] at [facility]   PATIENT NAME: Jennifer [name]    MR#:  [id number]  DATE OF BIRTH:  05-May-1961  SUBJECTIVE:  Weak. Not able to understand. Caregiver in the room More alert  REVIEW OF SYSTEMS:   Review of Systems  Constitutional: Negative for fever, chills and weight loss.  HENT: Negative for ear discharge, ear pain and nosebleeds.   Eyes: Negative for blurred vision, pain and discharge.  Respiratory: Negative for sputum production, shortness of breath, wheezing and stridor.   Cardiovascular: Negative for chest pain, palpitations, orthopnea and PND.  Gastrointestinal: Negative for nausea, vomiting, abdominal pain and diarrhea.  Genitourinary: Negative for urgency and frequency.  Musculoskeletal: Negative for back pain and joint pain.  Neurological: Negative for sensory change, speech change, focal weakness and weakness.  Psychiatric/Behavioral: Negative for depression and hallucinations. The patient is not nervous/anxious.    Tolerating Diet:yes Tolerating PT: bed bound due to ALS  DRUG ALLERGIES:   Allergies  Allergen Reactions  . Bee Venom Anaphylaxis  . Hydrocodone Other (See Comments)    Reaction:  Muscle stiffness   . Lipitor [Atorvastatin] Other (See Comments)    Reaction:  Muscle cramps   . Shellfish Allergy Anaphylaxis  . Oxycodone Other (See Comments)    Reaction:  Muscle stiffness   . Prednisone Other (See Comments)    Reaction:  Psychosis   . Vicodin [Hydrocodone-Acetaminophen] Other (See Comments)    Reaction:  Muscle stiffness   . Oxycontin [Oxycodone Hcl] Other (See Comments)    Reaction:  Muscle stiffness   . Peanut-Containing Drug Products Diarrhea    VITALS:  Blood pressure 110/68, pulse 88, temperature 98 F (36.7 C), temperature source Oral, resp. rate 17, height  (1.499 m), weight 42.593 kg (93 lb 14.4 oz), SpO2 100  %.  PHYSICAL EXAMINATION:   Physical Exam  GENERAL:  54 y.o.-year-old patient lying in the bed with no acute distress. thin EYES: Pupils equal, round, reactive to light and accommodation. No scleral icterus. Extraocular muscles intact.  HEENT: Head atraumatic, normocephalic. Oropharynx and nasopharynx clear.  NECK:  Supple, no jugular venous distention. No thyroid enlargement, no tenderness.  LUNGS: Normal breath sounds bilaterally, no wheezing, rales, rhonchi. No use of accessory muscles of respiration.  CARDIOVASCULAR: S1, S2 normal. No murmurs, rubs, or gallops.  ABDOMEN: Soft, nontender, nondistended. Bowel sounds present. No organomegaly or mass. Chronic foley EXTREMITIES: No cyanosis, clubbing or edema b/l.    NEUROLOGIC: unable to assess due to severe contractures from ALS PSYCHIATRIC:  patient is alert  SKIN: No obvious rash, lesion, or ulcer.    LABORATORY PANEL:   CBC  Recent Labs Lab 06/27/15 0536  WBC 5.9  HGB 10.7*  HCT 31.8*  PLT 186    Chemistries   Recent Labs Lab 06/26/15 1653  06/28/15 1108 06/29/15 1105  NA 136  < > 137  --   K 2.8*  < > 5.6* 4.4  CL 105  < > 112*  --   CO2 21*  < > 21*  --   GLUCOSE 110*  < > 78  --   BUN 10  < > <5*  --   CREATININE 0.67  < > 0.47  --   CALCIUM 9.5  < > 8.7*  --   AST 20  --   --   --   ALT 20  --   --   --  ALKPHOS 56  --   --   --   BILITOT 0.7  --   --   --   < > = values in this interval not displayed.  Cardiac Enzymes No results for input(s): TROPONINI in the last 168 hours.  RADIOLOGY:  No results found.   ASSESSMENT AND PLAN:   Jennifer [name] is a 54 y.o. female with a known history of ALS, chronic Foley catheter is brought to the emergency room by husband due to hallucinations, weakness and confusion. Patient does have psychiatric issues and generally gets agitated due to those. Patient has been treated with fosfomycin in October 2016 for a UTI which seem to have improved her mentation  *  UTI with acute encephalopathy, Indwelling catheter related Patient has recurrent UTI. 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 Was treated with fosfomycin in October and responded.  on Invanz daily Added IV daptomycin by ID Urine cultures have been sent. Consult infectious disease.with dr fitzgerald appreciated.po fosphomycin at d/c  *hypotesnion  Suspected due to UTI and poor po intake -improved with IVF  * Bipolar disorder with psychosis Resume home meds -pt much calm  * ALS Patient is bedbound at baseline.  * Constipation Patient takes daily Colace and lactulose which will be continued. - will order fleets enema and as needed suppository.  * DVT prophylaxis with Lovenox.  Will d/c to home in am if remains stable Spoke with husband in the room  Case discussed with Care Management/Social Worker. Management plans discussed with the patient, family and they are in agreement.  CODE STATUS: partilal  TOTAL critical TIME TAKING CARE OF THIS PATIENT: 30 minutes.  >50% time spent on counselling and coordination of care  POSSIBLE D/C IN 1-2 DAYS, DEPENDING ON CLINICAL CONDITION.   Jennifer [name] M.D on 06/28/2015  Between 7am to 6pm - Pager - [phone]  After 6pm go to [email or web address] - password [email or web address] [facility]  [facility] [place] [facility]  Office  [phone]  CC: Primary care physician; Jennifer Mannanalia Aron, MD

## 2015-06-29 NOTE — Care Management Note (Signed)
Case Management Note  Patient Details  Name: Jennifer [name] MRN: [id number] Date of Birth: 09-Nov-1960  Subjective/Objective:    Current Home Health services with [facility] were resumed for RN and Aid.               Action/Plan:   Expected Discharge Date:  06/27/15               Expected Discharge Plan:     In-House Referral:     Discharge planning Services  CM Consult  Post Acute Care Choice:  Home Health Choice offered to:  Patient, Spouse  DME Arranged:    DME Agency:     HH Arranged:  RN, PT HH Agency:  [facility]  Status of Service:  In process, will continue to follow  Medicare Important Message Given:    Date Medicare IM Given:    Medicare IM give by:    Date Additional Medicare IM Given:    Additional Medicare Important Message give by:     If discussed at Long Length of Stay Meetings, dates discussed:    Additional Comments:  James Senn A, RN 06/29/2015, 2:04 PM

## 2015-07-01 LAB — CULTURE, BLOOD (ROUTINE X 2)
Culture: NO GROWTH
Culture: NO GROWTH

## 2015-07-02 ENCOUNTER — Other Ambulatory Visit: Payer: Self-pay | Admitting: Radiology

## 2015-07-03 ENCOUNTER — Ambulatory Visit
Admission: RE | Admit: 2015-07-03 | Discharge: 2015-07-03 | Disposition: A | Discharge status: Home / Self Care | Payer: BLUE CROSS/BLUE SHIELD | Source: Ambulatory Visit | Attending: Urology | Admitting: Urology

## 2015-07-03 MED ORDER — SODIUM CHLORIDE 0.9 % IV SOLN
Freq: Once | INTRAVENOUS | Status: DC
Start: 2015-07-03 — End: 2015-07-11

## 2015-07-04 ENCOUNTER — Telehealth: Payer: Self-pay

## 2015-07-04 NOTE — Telephone Encounter (Signed)
Jennifer [name] with [facility] left v/m requesting verbal orders for recertification of changing catheter once a month. Please advise.

## 2015-07-05 NOTE — Telephone Encounter (Signed)
Spoke to [place] and provided verbal orders

## 2015-07-05 NOTE — Telephone Encounter (Signed)
Ok to give verbal order as requested. 

## 2015-07-14 ENCOUNTER — Other Ambulatory Visit: Payer: Self-pay | Admitting: Family Medicine

## 2015-07-19 ENCOUNTER — Other Ambulatory Visit: Payer: Self-pay | Admitting: Family Medicine

## 2015-07-19 NOTE — Telephone Encounter (Signed)
rx called into pharmacy

## 2015-07-25 DIAGNOSIS — G1221 Amyotrophic lateral sclerosis: Secondary | ICD-10-CM | POA: Diagnosis not present

## 2015-07-25 DIAGNOSIS — F3289 Other specified depressive episodes: Secondary | ICD-10-CM | POA: Diagnosis not present

## 2015-07-25 DIAGNOSIS — Z87440 Personal history of urinary (tract) infections: Secondary | ICD-10-CM | POA: Diagnosis not present

## 2015-07-25 DIAGNOSIS — Z87891 Personal history of nicotine dependence: Secondary | ICD-10-CM | POA: Diagnosis not present

## 2015-07-26 ENCOUNTER — Ambulatory Visit: Payer: Self-pay | Admitting: Family Medicine

## 2015-07-26 ENCOUNTER — Ambulatory Visit (INDEPENDENT_AMBULATORY_CARE_PROVIDER_SITE_OTHER): Payer: BLUE CROSS/BLUE SHIELD | Admitting: Family Medicine

## 2015-07-26 ENCOUNTER — Encounter: Payer: Self-pay | Admitting: Family Medicine

## 2015-07-26 VITALS — BP 147/96 | HR 92 | Temp 98.4°F | Ht 59.0 in

## 2015-07-26 DIAGNOSIS — N390 Urinary tract infection, site not specified: Secondary | ICD-10-CM | POA: Diagnosis not present

## 2015-07-26 DIAGNOSIS — T83511A Infection and inflammatory reaction due to indwelling urethral catheter, initial encounter: Secondary | ICD-10-CM

## 2015-07-26 DIAGNOSIS — G1221 Amyotrophic lateral sclerosis: Secondary | ICD-10-CM

## 2015-07-26 LAB — POCT URINALYSIS DIP (MANUAL ENTRY)
BILIRUBIN UA: NEGATIVE
Glucose, UA: NEGATIVE
NITRITE UA: NEGATIVE
PH UA: 6
Spec Grav, UA: >=1.03
UROBILINOGEN UA: 0.2

## 2015-07-26 NOTE — Patient Instructions (Signed)
Will send urine for culture. Push fluids If mentation worsens got to ED Stop at front desk to set up referral to ID.

## 2015-07-26 NOTE — Assessment & Plan Note (Signed)
Will send urine for culture .[name]. Taken from second lumen. Push fluids If mentation worsens. Stop at front desk to set up referral to ID.

## 2015-07-26 NOTE — Assessment & Plan Note (Addendum)
Hx of multidrug resistant UTI.  Reviewed recent hospitalization. Multiple UTI in last several months. Refer to ID as recommended at hospital D/C. Will send urine for culture .[name]. Taken from second lumen. Push fluids If mentation worsens. Stop at front desk to set up referral to ID.

## 2015-07-26 NOTE — Progress Notes (Signed)
Pre visit review using our clinic review tool, if applicable. No additional management support is needed unless otherwise documented below in the visit note. 

## 2015-07-26 NOTE — Addendum Note (Signed)
Addended by: Kerby NoraBEDSOLE, Garion Wempe E on: 07/26/2015 11:24 AM   Modules accepted: SmartSet

## 2015-07-26 NOTE — Progress Notes (Signed)
   Subjective:    Patient ID: Jennifer [name], female    DOB: November 09, 1960, 54 y.o.   MRN: [id number]  54 y.o. female with a known history of ALS, chronic Foley catheter, hx of recurrent UTTI Urinary Tract Infection  This is a new problem. The current episode started 1 to 4 weeks ago (1-2 week). The problem occurs every urination. The problem has been gradually worsening. The quality of the pain is described as burning. The pain is moderate. There has been no fever. She is not sexually active. There is a history of pyelonephritis. Pertinent negatives include no flank pain, hematuria, nausea or possible pregnancy. She has tried nothing for the symptoms.   She has been having some increase in agitation in last week which usually corresponds with UTI in her case.   She has a urinary catheter in place.   11/30 was in hospital stay for UTI. 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  U culture: multi species present. Treated with fosfomycin. 12/10 was her last dose.  Recommended referral to ID.[name]. She has not gone yet.   fosfomycin in October 2016 for a UTI  Social History /Family History/Past Medical History reviewed and updated if needed.  Review of Systems  Gastrointestinal: Negative for nausea.  Genitourinary: Negative for hematuria and flank pain.       Objective:   Physical Exam  Constitutional: Vital signs are normal. She appears well-developed and well-nourished. She is cooperative.  Non-toxic appearance. She does not appear ill. No distress.  Pt unable to speak well given ALS, in wheelchair, poor muscle tone.  HENT:  Head: Normocephalic.  Right Ear: Hearing, tympanic membrane, external ear and ear canal normal. Tympanic membrane is not erythematous, not retracted and not bulging.  Left Ear: Hearing, tympanic membrane, external ear and ear canal normal. Tympanic membrane is not erythematous, not retracted and not bulging.  Nose: No mucosal edema or rhinorrhea. Right sinus exhibits  no maxillary sinus tenderness and no frontal sinus tenderness. Left sinus exhibits no maxillary sinus tenderness and no frontal sinus tenderness.  Mouth/Throat: Uvula is midline, oropharynx is clear and moist and mucous membranes are normal.  Eyes: Conjunctivae, EOM and lids are normal. Pupils are equal, round, and reactive to light. Lids are everted and swept, no foreign bodies found.  Neck: Trachea normal and normal range of motion. Neck supple. Carotid bruit is not present. No thyroid mass and no thyromegaly present.  Cardiovascular: Normal rate, regular rhythm, S1 normal, S2 normal, normal heart sounds, intact distal pulses and normal pulses.  Exam reveals no gallop and no friction rub.   No murmur heard. Pulmonary/Chest: Effort normal and breath sounds normal. No tachypnea. No respiratory distress. She has no decreased breath sounds. She has no wheezes. She has no rhonchi. She has no rales.  Abdominal: Soft. Normal appearance and bowel sounds are normal. There is tenderness in the right lower quadrant. There is CVA tenderness.  right  Neurological: She is alert.  Skin: Skin is warm, dry and intact. No rash noted.  Psychiatric: Her speech is normal and behavior is normal. Judgment and thought content normal. Her mood appears not anxious. Cognition and memory are normal. She does not exhibit a depressed mood.          Assessment & Plan:

## 2015-07-29 LAB — URINE CULTURE

## 2015-07-30 ENCOUNTER — Telehealth: Payer: Self-pay | Admitting: Family Medicine

## 2015-07-30 MED ORDER — SULFAMETHOXAZOLE-TRIMETHOPRIM 800-160 MG PO TABS: 1.0000 | ORAL_TABLET | Freq: Two times a day (BID) | ORAL | Status: DC

## 2015-07-30 NOTE — Telephone Encounter (Signed)
Notify pt that she does have a  UTI but luckily it appears sensitive to sulfa meds or cephalosporins. I would suggest treating with bactrim DS  1 tab po BID x 10 days. i will send in

## 2015-07-30 NOTE — Addendum Note (Signed)
Addended by: Kerby NoraBEDSOLE, Kaci Freel E on: 07/30/2015 07:54 AM   Modules accepted: SmartSet

## 2015-07-30 NOTE — Telephone Encounter (Signed)
Spoke to pt and advised per Dr Bedsole 

## 2015-08-02 ENCOUNTER — Telehealth: Payer: Self-pay | Admitting: Family Medicine

## 2015-08-02 NOTE — Telephone Encounter (Signed)
Phone [phone] Calling in to request home health aide to help with dressing, etc... Jennifer StanleyLisa mithcell called requesting

## 2015-08-02 NOTE — Telephone Encounter (Signed)
Ok to give verbal order as requested. 

## 2015-08-02 NOTE — Telephone Encounter (Signed)
Lm on Jennifer [name]'s vm and provided verbal order

## 2015-08-12 ENCOUNTER — Ambulatory Visit: Payer: BLUE CROSS/BLUE SHIELD | Admitting: Internal Medicine

## 2015-08-15 ENCOUNTER — Ambulatory Visit (INDEPENDENT_AMBULATORY_CARE_PROVIDER_SITE_OTHER): Payer: BLUE CROSS/BLUE SHIELD | Admitting: Family Medicine

## 2015-08-15 VITALS — BP 136/70 | HR 90 | Temp 98.1°F

## 2015-08-15 DIAGNOSIS — G1221 Amyotrophic lateral sclerosis: Secondary | ICD-10-CM

## 2015-08-15 DIAGNOSIS — N390 Urinary tract infection, site not specified: Secondary | ICD-10-CM | POA: Diagnosis not present

## 2015-08-15 LAB — POC URINALSYSI DIPSTICK (AUTOMATED)
Bilirubin, UA: NEGATIVE
Glucose, UA: NEGATIVE
KETONES UA: NEGATIVE
Nitrite, UA: NEGATIVE
PH UA: 6
PROTEIN UA: POSITIVE
UROBILINOGEN UA: 0.2

## 2015-08-15 MED ORDER — CIPROFLOXACIN HCL 500 MG PO TABS: 500.0000 mg | ORAL_TABLET | Freq: Two times a day (BID) | ORAL | Status: DC

## 2015-08-15 NOTE — Progress Notes (Signed)
Pre visit review using our clinic review tool, if applicable. No additional management support is needed unless otherwise documented below in the visit note. 

## 2015-08-15 NOTE — Progress Notes (Signed)
Subjective:    Patient ID: Jennifer [name], female    DOB: 28-Feb-1961, 55 y.o.   MRN: [id number]  HPI    55 y.o. female with a known history of ALS, chronic Foley catheter, hx of recurrent resistant UTI infections here with her husband for follow up UTI.  Saw Dr. Ermalene Searing on 07/26/15 for UTI symptom- in her case, typically agitation or abdominal pain since she can no longer communicate verbally.  Note reviewed.    She has a urinary catheter in place.   She sent urine for culture which grew Klebiella, resistant only to ampicillin.  Treated with Bactrim.  11/30 was in hospital stay for UTI.She has had VRE and possible ESBL in the past. Treated with fosfomycin.   Recommended referral to ID which Dr. Ermalene Searing placed on 12/30- this has still not been scheduled by ID.  Treated with fosfomycin in October 2016 for a UTI   Current Outpatient Prescriptions on File Prior to Visit  Medication Sig Dispense Refill  . ARIPiprazole (ABILIFY) 5 MG tablet TAKE 1 TABLET (5 MG TOTAL) BY MOUTH DAILY. 30 tablet 0  . atropine 1 % ophthalmic solution Place 2 drops under the tongue as needed (for excessive secretions).    [name] buPROPion (WELLBUTRIN SR) 150 MG 12 hr tablet Take 300 mg by mouth 2 (two) times daily.    . cholecalciferol (VITAMIN D) 1000 UNITS tablet Take 1,000 Units by mouth daily.    [name] Dextromethorphan-Quinidine (NUEDEXTA) 20-10 MG CAPS Take 1 capsule by mouth 2 (two) times daily.     . diazepam (VALIUM) 10 MG tablet TAKE 1 TABLET EVERY 6 HOURS AS NEEDED 60 tablet 0  . fluticasone (FLONASE) 50 MCG/ACT nasal spray Place 2 sprays into both nostrils daily as needed for rhinitis.    . fosfomycin (MONUROL) 3 G PACK Take 3 g by mouth every other day. 15 g 0  . ibuprofen (ADVIL,MOTRIN) 200 MG tablet Take 800 mg by mouth 3 (three) times daily as needed for mild pain.     [name] lactulose (CHRONULAC) 10 GM/15ML solution Take 15 mLs (10 g total) by mouth 3 (three) times daily. (Patient taking differently: Take  10 g by mouth 3 (three) times daily as needed for mild constipation. ) 240 mL 0  . levETIRAcetam (KEPPRA) 500 MG tablet Take 500 mg by mouth 2 (two) times daily.     [name] loratadine (CLARITIN) 10 MG tablet Take 10 mg by mouth daily as needed for allergies.     [name] omeprazole (PRILOSEC) 40 MG capsule Take 40 mg by mouth daily.     . potassium chloride 20 MEQ/15ML (10%) SOLN Take 20 mEq by mouth daily.    . ranitidine (ZANTAC) 150 MG tablet Take 150 mg by mouth daily as needed for heartburn.    . tizanidine (ZANAFLEX) 2 MG capsule Take 2 mg by mouth at bedtime.      No current facility-administered medications on file prior to visit.    Allergies  Allergen Reactions  . Bee Venom Anaphylaxis  . Hydrocodone Other (See Comments)    Reaction:  Muscle stiffness   . Lipitor [Atorvastatin] Other (See Comments)    Reaction:  Muscle cramps   . Shellfish Allergy Anaphylaxis  . Oxycodone Other (See Comments)    Reaction:  Muscle stiffness   . Prednisone Other (See Comments)    Reaction:  Psychosis   . Vicodin [Hydrocodone-Acetaminophen] Other (See Comments)    Reaction:  Muscle stiffness   . Oxycontin [Oxycodone Hcl]  Other (See Comments)    Reaction:  Muscle stiffness   . Peanut-Containing Drug Products Diarrhea    Past Medical History  Diagnosis Date  . ALS (amyotrophic lateral sclerosis) (HCC)   . Depression   . GERD (gastroesophageal reflux disease)   . Urinary retention   . Dysphagia, oropharyngeal 11/28/2013  . CN (constipation) 11/28/2013    Past Surgical History  Procedure Laterality Date  . Cesarean section    . Nasal sinus surgery      Family History  Problem Relation Age of Onset  . COPD Mother   . Thyroid disease Mother   . Diverticulosis Mother   . Heart attack Mother   . Gout Father   . Depression Sister   . Thyroid disease Sister   . Cancer Brother     kidney  . Heart attack Maternal Grandmother   . Heart attack Maternal Grandfather   . Heart attack Paternal  Grandfather   . Hyperlipidemia Sister   . Evelene Croon Parkinson White syndrome Sister 25  . Depression Brother   . Hypertension Brother     Social History   Social History  . Marital Status: Married    Spouse Name: N/A  . Number of Children: 1  . Years of Education: N/A   Occupational History  . home    Social History Main Topics  . Smoking status: Former Games developer  . Smokeless tobacco: Never Used  . Alcohol Use: No  . Drug Use: No  . Sexual Activity: No   Other Topics Concern  . Not on file   Social History Narrative   Daughter has severe reflux since infancy   The PMH, PSH, Social History, Family History, Medications, and allergies have been reviewed in [facility], and have been updated if relevant.   Review of Systems  Constitutional: Negative for fever.  Respiratory: Negative.   Cardiovascular: Negative.   Gastrointestinal: Positive for abdominal pain. Negative for nausea, vomiting, diarrhea, constipation, blood in stool, anal bleeding and rectal pain.  Neurological: Negative.   Hematological: Negative.   Psychiatric/Behavioral: Positive for agitation.  All other systems reviewed and are negative.      Objective:   Physical Exam  Constitutional: Vital signs are normal. She appears well-developed and well-nourished. She is cooperative.  Non-toxic appearance. She does not appear ill. No distress.  Pt unable to speak well given ALS, in wheelchair, poor muscle tone.  HENT:  Head: Normocephalic.  Right Ear: Hearing, tympanic membrane, external ear and ear canal normal. Tympanic membrane is not erythematous, not retracted and not bulging.  Left Ear: Hearing, tympanic membrane, external ear and ear canal normal. Tympanic membrane is not erythematous, not retracted and not bulging.  Nose: No mucosal edema or rhinorrhea. Right sinus exhibits no maxillary sinus tenderness and no frontal sinus tenderness. Left sinus exhibits no maxillary sinus tenderness and no frontal sinus  tenderness.  Mouth/Throat: Uvula is midline, oropharynx is clear and moist and mucous membranes are normal.  Eyes: Conjunctivae, EOM and lids are normal. Pupils are equal, round, and reactive to light. Lids are everted and swept, no foreign bodies found.  Neck: Trachea normal and normal range of motion. Neck supple. Carotid bruit is not present. No thyroid mass and no thyromegaly present.  Cardiovascular: Normal rate, regular rhythm, S1 normal, S2 normal, normal heart sounds, intact distal pulses and normal pulses.  Exam reveals no gallop and no friction rub.   No murmur heard. Pulmonary/Chest: Effort normal and breath sounds normal. No tachypnea. No respiratory distress.  She has no decreased breath sounds. She has no wheezes. She has no rhonchi. She has no rales.  Abdominal: Soft. Normal appearance and bowel sounds are normal. There is tenderness in the right lower quadrant. There is CVA tenderness.  right  Neurological: She is alert.  Skin: Skin is warm, dry and intact. No rash noted.  Psychiatric: Her speech is normal and behavior is normal. Judgment and thought content normal. Her mood appears not anxious. Cognition and memory are normal. She does not exhibit a depressed mood.          Assessment & Plan:

## 2015-08-15 NOTE — Assessment & Plan Note (Addendum)
Complicated. Not enough urine in her bag to send for cx. Working on ID consult with our referrals coordinator today.  Cipro 500 mg twice daily x 7 days.

## 2015-08-16 ENCOUNTER — Telehealth: Payer: Self-pay | Admitting: Family Medicine

## 2015-08-16 NOTE — Telephone Encounter (Signed)
Spoke to pt and advised per Dr Dayton Martes. Pt states that she has tried Keppra, but he will contact her ALS provider and get their suggestions

## 2015-08-16 NOTE — Telephone Encounter (Signed)
That is already quite a high dose.  Has she tried anything else for spasms? What else has her ALS doctor suggested?

## 2015-08-16 NOTE — Telephone Encounter (Signed)
Jennifer [name]  health called Pt is on back spasm and wants to increase her valium She is taking  every 6 hours as needed

## 2015-08-16 NOTE — Telephone Encounter (Signed)
Noted! Thank you

## 2015-08-19 ENCOUNTER — Other Ambulatory Visit: Payer: Self-pay | Admitting: *Deleted

## 2015-08-19 MED ORDER — DIAZEPAM 10 MG PO TABS: 10.0000 mg | ORAL_TABLET | Freq: Four times a day (QID) | ORAL | Status: DC | PRN

## 2015-08-19 NOTE — Telephone Encounter (Signed)
Last f/u 12/2014 

## 2015-08-19 NOTE — Telephone Encounter (Signed)
Rx called in to requested pharmacy 

## 2015-08-20 ENCOUNTER — Ambulatory Visit: Payer: Self-pay | Admitting: Internal Medicine

## 2015-08-26 ENCOUNTER — Ambulatory Visit: Payer: Self-pay | Admitting: Internal Medicine

## 2015-09-04 ENCOUNTER — Telehealth: Payer: Self-pay | Admitting: Family Medicine

## 2015-09-04 ENCOUNTER — Emergency Department: Payer: BLUE CROSS/BLUE SHIELD

## 2015-09-04 ENCOUNTER — Inpatient Hospital Stay
Admission: EM | Admit: 2015-09-04 | Discharge: 2015-09-24 | DRG: 698 | Disposition: A | Discharge status: Home Health | Payer: BLUE CROSS/BLUE SHIELD | Attending: Internal Medicine | Admitting: Internal Medicine

## 2015-09-04 ENCOUNTER — Encounter: Payer: Self-pay | Admitting: Emergency Medicine

## 2015-09-04 DIAGNOSIS — R739 Hyperglycemia, unspecified: Secondary | ICD-10-CM | POA: Diagnosis not present

## 2015-09-04 DIAGNOSIS — J189 Pneumonia, unspecified organism: Secondary | ICD-10-CM | POA: Diagnosis not present

## 2015-09-04 DIAGNOSIS — Z87891 Personal history of nicotine dependence: Secondary | ICD-10-CM

## 2015-09-04 DIAGNOSIS — J9690 Respiratory failure, unspecified, unspecified whether with hypoxia or hypercapnia: Secondary | ICD-10-CM | POA: Diagnosis not present

## 2015-09-04 DIAGNOSIS — Z8249 Family history of ischemic heart disease and other diseases of the circulatory system: Secondary | ICD-10-CM

## 2015-09-04 DIAGNOSIS — N3000 Acute cystitis without hematuria: Secondary | ICD-10-CM | POA: Diagnosis present

## 2015-09-04 DIAGNOSIS — R600 Localized edema: Secondary | ICD-10-CM | POA: Diagnosis present

## 2015-09-04 DIAGNOSIS — N390 Urinary tract infection, site not specified: Secondary | ICD-10-CM

## 2015-09-04 DIAGNOSIS — Z66 Do not resuscitate: Secondary | ICD-10-CM | POA: Diagnosis present

## 2015-09-04 DIAGNOSIS — G1221 Amyotrophic lateral sclerosis: Secondary | ICD-10-CM | POA: Diagnosis present

## 2015-09-04 DIAGNOSIS — Z1621 Resistance to vancomycin: Secondary | ICD-10-CM | POA: Diagnosis present

## 2015-09-04 DIAGNOSIS — A047 Enterocolitis due to Clostridium difficile: Secondary | ICD-10-CM | POA: Diagnosis not present

## 2015-09-04 DIAGNOSIS — Z79899 Other long term (current) drug therapy: Secondary | ICD-10-CM | POA: Diagnosis not present

## 2015-09-04 DIAGNOSIS — R6521 Severe sepsis with septic shock: Secondary | ICD-10-CM | POA: Diagnosis present

## 2015-09-04 DIAGNOSIS — Z1624 Resistance to multiple antibiotics: Secondary | ICD-10-CM | POA: Diagnosis present

## 2015-09-04 DIAGNOSIS — J9811 Atelectasis: Secondary | ICD-10-CM | POA: Diagnosis present

## 2015-09-04 DIAGNOSIS — F3289 Other specified depressive episodes: Secondary | ICD-10-CM | POA: Diagnosis not present

## 2015-09-04 DIAGNOSIS — Z0189 Encounter for other specified special examinations: Secondary | ICD-10-CM

## 2015-09-04 DIAGNOSIS — T83511A Infection and inflammatory reaction due to indwelling urethral catheter, initial encounter: Principal | ICD-10-CM | POA: Diagnosis present

## 2015-09-04 DIAGNOSIS — Z91013 Allergy to seafood: Secondary | ICD-10-CM

## 2015-09-04 DIAGNOSIS — R34 Anuria and oliguria: Secondary | ICD-10-CM | POA: Diagnosis not present

## 2015-09-04 DIAGNOSIS — Z87440 Personal history of urinary (tract) infections: Secondary | ICD-10-CM | POA: Diagnosis not present

## 2015-09-04 DIAGNOSIS — R0689 Other abnormalities of breathing: Secondary | ICD-10-CM | POA: Diagnosis not present

## 2015-09-04 DIAGNOSIS — J9621 Acute and chronic respiratory failure with hypoxia: Secondary | ICD-10-CM | POA: Diagnosis not present

## 2015-09-04 DIAGNOSIS — R0602 Shortness of breath: Secondary | ICD-10-CM

## 2015-09-04 DIAGNOSIS — L89111 Pressure ulcer of right upper back, stage 1: Secondary | ICD-10-CM | POA: Diagnosis present

## 2015-09-04 DIAGNOSIS — K224 Dyskinesia of esophagus: Secondary | ICD-10-CM | POA: Diagnosis present

## 2015-09-04 DIAGNOSIS — Z825 Family history of asthma and other chronic lower respiratory diseases: Secondary | ICD-10-CM

## 2015-09-04 DIAGNOSIS — Z888 Allergy status to other drugs, medicaments and biological substances status: Secondary | ICD-10-CM | POA: Diagnosis not present

## 2015-09-04 DIAGNOSIS — K560 Paralytic ileus: Secondary | ICD-10-CM | POA: Diagnosis present

## 2015-09-04 DIAGNOSIS — K567 Ileus, unspecified: Secondary | ICD-10-CM | POA: Diagnosis not present

## 2015-09-04 DIAGNOSIS — K648 Other hemorrhoids: Secondary | ICD-10-CM | POA: Insufficient documentation

## 2015-09-04 DIAGNOSIS — B952 Enterococcus as the cause of diseases classified elsewhere: Secondary | ICD-10-CM | POA: Diagnosis present

## 2015-09-04 DIAGNOSIS — E43 Unspecified severe protein-calorie malnutrition: Secondary | ICD-10-CM | POA: Diagnosis present

## 2015-09-04 DIAGNOSIS — L8990 Pressure ulcer of unspecified site, unspecified stage: Secondary | ICD-10-CM | POA: Diagnosis not present

## 2015-09-04 DIAGNOSIS — F319 Bipolar disorder, unspecified: Secondary | ICD-10-CM | POA: Diagnosis present

## 2015-09-04 DIAGNOSIS — J9620 Acute and chronic respiratory failure, unspecified whether with hypoxia or hypercapnia: Secondary | ICD-10-CM | POA: Diagnosis not present

## 2015-09-04 DIAGNOSIS — E876 Hypokalemia: Secondary | ICD-10-CM | POA: Diagnosis present

## 2015-09-04 DIAGNOSIS — Y846 Urinary catheterization as the cause of abnormal reaction of the patient, or of later complication, without mention of misadventure at the time of the procedure: Secondary | ICD-10-CM | POA: Diagnosis present

## 2015-09-04 DIAGNOSIS — J9600 Acute respiratory failure, unspecified whether with hypoxia or hypercapnia: Secondary | ICD-10-CM

## 2015-09-04 DIAGNOSIS — J690 Pneumonitis due to inhalation of food and vomit: Secondary | ICD-10-CM | POA: Diagnosis not present

## 2015-09-04 DIAGNOSIS — A419 Sepsis, unspecified organism: Secondary | ICD-10-CM | POA: Diagnosis not present

## 2015-09-04 DIAGNOSIS — Z452 Encounter for adjustment and management of vascular access device: Secondary | ICD-10-CM

## 2015-09-04 DIAGNOSIS — Z7401 Bed confinement status: Secondary | ICD-10-CM

## 2015-09-04 DIAGNOSIS — K219 Gastro-esophageal reflux disease without esophagitis: Secondary | ICD-10-CM | POA: Diagnosis present

## 2015-09-04 DIAGNOSIS — T85598A Other mechanical complication of other gastrointestinal prosthetic devices, implants and grafts, initial encounter: Secondary | ICD-10-CM

## 2015-09-04 DIAGNOSIS — G40909 Epilepsy, unspecified, not intractable, without status epilepticus: Secondary | ICD-10-CM | POA: Diagnosis present

## 2015-09-04 DIAGNOSIS — F419 Anxiety disorder, unspecified: Secondary | ICD-10-CM | POA: Diagnosis present

## 2015-09-04 DIAGNOSIS — Z4659 Encounter for fitting and adjustment of other gastrointestinal appliance and device: Secondary | ICD-10-CM

## 2015-09-04 DIAGNOSIS — G9341 Metabolic encephalopathy: Secondary | ICD-10-CM | POA: Diagnosis present

## 2015-09-04 DIAGNOSIS — R400 Somnolence: Secondary | ICD-10-CM | POA: Diagnosis not present

## 2015-09-04 DIAGNOSIS — R1319 Other dysphagia: Secondary | ICD-10-CM | POA: Diagnosis present

## 2015-09-04 DIAGNOSIS — K642 Third degree hemorrhoids: Secondary | ICD-10-CM | POA: Diagnosis present

## 2015-09-04 DIAGNOSIS — R509 Fever, unspecified: Secondary | ICD-10-CM

## 2015-09-04 DIAGNOSIS — R0902 Hypoxemia: Secondary | ICD-10-CM

## 2015-09-04 DIAGNOSIS — E162 Hypoglycemia, unspecified: Secondary | ICD-10-CM | POA: Diagnosis not present

## 2015-09-04 DIAGNOSIS — J9601 Acute respiratory failure with hypoxia: Secondary | ICD-10-CM | POA: Diagnosis not present

## 2015-09-04 LAB — URINALYSIS COMPLETE WITH MICROSCOPIC (ARMC ONLY)
Bilirubin Urine: NEGATIVE
Glucose, UA: NEGATIVE mg/dL
NITRITE: NEGATIVE
PH: 5 (ref 5.0–8.0)
PROTEIN: 30 mg/dL — AB
SPECIFIC GRAVITY, URINE: 1.026 (ref 1.005–1.030)
Squamous Epithelial / LPF: NONE SEEN

## 2015-09-04 LAB — COMPREHENSIVE METABOLIC PANEL
ALT: 11 U/L — ABNORMAL LOW (ref 14–54)
AST: 20 U/L (ref 15–41)
Albumin: 3.7 g/dL (ref 3.5–5.0)
Alkaline Phosphatase: 51 U/L (ref 38–126)
Anion gap: 10 (ref 5–15)
BILIRUBIN TOTAL: 0.9 mg/dL (ref 0.3–1.2)
BUN: 15 mg/dL (ref 6–20)
CHLORIDE: 100 mmol/L — AB (ref 101–111)
CO2: 26 mmol/L (ref 22–32)
CREATININE: 0.69 mg/dL (ref 0.44–1.00)
Calcium: 9 mg/dL (ref 8.9–10.3)
GFR calc Af Amer: >60 mL/min (ref >60)
GFR calc non Af Amer: >60 mL/min (ref >60)
Glucose, Bld: 82 mg/dL (ref 65–99)
Potassium: 3.1 mmol/L — ABNORMAL LOW (ref 3.5–5.1)
Sodium: 136 mmol/L (ref 135–145)
Total Protein: 6.7 g/dL (ref 6.5–8.1)

## 2015-09-04 LAB — CBC
HEMATOCRIT: 32.6 % — AB (ref 35.0–47.0)
Hemoglobin: 11.1 g/dL — ABNORMAL LOW (ref 12.0–16.0)
MCH: 30.9 pg (ref 26.0–34.0)
MCHC: 34.1 g/dL (ref 32.0–36.0)
MCV: 90.7 fL (ref 80.0–100.0)
Platelets: 229 10*3/uL (ref 150–440)
RBC: 3.59 MIL/uL — ABNORMAL LOW (ref 3.80–5.20)
RDW: 14.3 % (ref 11.5–14.5)
WBC: 11.3 10*3/uL — ABNORMAL HIGH (ref 3.6–11.0)

## 2015-09-04 LAB — TROPONIN I: Troponin I: 0.03 ng/mL (ref <0.031)

## 2015-09-04 LAB — LACTIC ACID, PLASMA: LACTIC ACID, VENOUS: 2.1 mmol/L — AB (ref 0.5–2.0)

## 2015-09-04 LAB — ETHANOL

## 2015-09-04 MED ORDER — PIPERACILLIN-TAZOBACTAM 3.375 G IVPB 30 MIN
3.3750 g | Freq: Once | INTRAVENOUS | Status: AC
  Administered 2015-09-04: 3.375 g via INTRAVENOUS
  Filled 2015-09-04: qty 50

## 2015-09-04 MED ORDER — ONDANSETRON HCL 4 MG PO TABS: 4.0000 mg | ORAL_TABLET | Freq: Four times a day (QID) | ORAL | Status: DC | PRN

## 2015-09-04 MED ORDER — POTASSIUM CHLORIDE IN NACL 20-0.9 MEQ/L-% IV SOLN
INTRAVENOUS | Status: DC
  Administered 2015-09-05 – 2015-09-09 (×6): via INTRAVENOUS
  Administered 2015-09-10: 50 mL/h via INTRAVENOUS
  Filled 2015-09-04 (×16): qty 1000

## 2015-09-04 MED ORDER — ENOXAPARIN SODIUM 40 MG/0.4ML ~~LOC~~ SOLN
40.0000 mg | SUBCUTANEOUS | Status: DC
  Administered 2015-09-05: 40 mg via SUBCUTANEOUS
  Filled 2015-09-04: qty 0.4

## 2015-09-04 MED ORDER — BISACODYL 10 MG RE SUPP: 10.0000 mg | Freq: Every day | RECTAL | Status: DC | PRN

## 2015-09-04 MED ORDER — SODIUM CHLORIDE 0.9 % IV BOLUS (SEPSIS)
500.0000 mL | INTRAVENOUS | Status: AC
  Administered 2015-09-04: 500 mL via INTRAVENOUS

## 2015-09-04 MED ORDER — ONDANSETRON HCL 4 MG/2ML IJ SOLN
4.0000 mg | Freq: Four times a day (QID) | INTRAMUSCULAR | Status: DC | PRN
  Administered 2015-09-10 – 2015-09-23 (×2): 4 mg via INTRAVENOUS
  Filled 2015-09-04 (×2): qty 2

## 2015-09-04 MED ORDER — POLYETHYLENE GLYCOL 3350 17 G PO PACK: 17.0000 g | PACK | Freq: Every day | ORAL | Status: DC | PRN

## 2015-09-04 MED ORDER — ALBUTEROL SULFATE (2.5 MG/3ML) 0.083% IN NEBU: 2.5000 mg | INHALATION_SOLUTION | RESPIRATORY_TRACT | Status: DC | PRN

## 2015-09-04 MED ORDER — MORPHINE SULFATE (PF) 2 MG/ML IV SOLN
1.0000 mg | INTRAVENOUS | Status: DC | PRN
  Administered 2015-09-05 – 2015-09-07 (×5): 1 mg via INTRAVENOUS
  Filled 2015-09-04 (×5): qty 1

## 2015-09-04 MED ORDER — ERTAPENEM SODIUM 1 G IJ SOLR
1.0000 g | INTRAMUSCULAR | Status: DC
  Administered 2015-09-06: 1 g via INTRAVENOUS
  Filled 2015-09-04 (×2): qty 1

## 2015-09-04 MED ORDER — VANCOMYCIN HCL IN DEXTROSE 1-5 GM/200ML-% IV SOLN
1000.0000 mg | Freq: Once | INTRAVENOUS | Status: AC
  Administered 2015-09-04: 1000 mg via INTRAVENOUS
  Filled 2015-09-04: qty 200

## 2015-09-04 MED ORDER — SODIUM CHLORIDE 0.9 % IV BOLUS (SEPSIS)
1000.0000 mL | Freq: Once | INTRAVENOUS | Status: AC
  Administered 2015-09-04: 1000 mL via INTRAVENOUS
  Filled 2015-09-04: qty 1000

## 2015-09-04 NOTE — ED Notes (Addendum)
54 yof from home via EMS presents for fever and AMS. Husband gave tylenol 4pm today. Hx recurring UTI. Finished abx 10 days ago.

## 2015-09-04 NOTE — ED Notes (Addendum)
hospitalist in to see pt; pt uprite on stretcher in exam room with eyes closed, nonverbal, no distress noted
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
## 2015-09-04 NOTE — Telephone Encounter (Signed)
Jennifer [name] from [facility] called requesting orders Replace catheter every month  cb num [phone]

## 2015-09-04 NOTE — Telephone Encounter (Signed)
Spoke to Lisa and provided verbal orders 

## 2015-09-04 NOTE — Telephone Encounter (Signed)
Ok to give verbal order as requested. 

## 2015-09-05 ENCOUNTER — Encounter: Payer: Self-pay | Admitting: Internal Medicine

## 2015-09-05 DIAGNOSIS — L8990 Pressure ulcer of unspecified site, unspecified stage: Secondary | ICD-10-CM | POA: Insufficient documentation

## 2015-09-05 DIAGNOSIS — E43 Unspecified severe protein-calorie malnutrition: Secondary | ICD-10-CM | POA: Insufficient documentation

## 2015-09-05 LAB — CBC
HEMATOCRIT: 29.6 % — AB (ref 35.0–47.0)
HEMOGLOBIN: 10.1 g/dL — AB (ref 12.0–16.0)
MCH: 31.1 pg (ref 26.0–34.0)
MCHC: 34.2 g/dL (ref 32.0–36.0)
MCV: 91 fL (ref 80.0–100.0)
Platelets: 195 10*3/uL (ref 150–440)
RBC: 3.26 MIL/uL — ABNORMAL LOW (ref 3.80–5.20)
RDW: 14.4 % (ref 11.5–14.5)
WBC: 11.7 10*3/uL — AB (ref 3.6–11.0)

## 2015-09-05 LAB — BASIC METABOLIC PANEL
ANION GAP: 8 (ref 5–15)
ANION GAP: 9 (ref 5–15)
BUN: 12 mg/dL (ref 6–20)
BUN: 8 mg/dL (ref 6–20)
CHLORIDE: 105 mmol/L (ref 101–111)
CO2: 25 mmol/L (ref 22–32)
CO2: 21 mmol/L — AB (ref 22–32)
Calcium: 8.1 mg/dL — ABNORMAL LOW (ref 8.9–10.3)
Calcium: 8.3 mg/dL — ABNORMAL LOW (ref 8.9–10.3)
Chloride: 105 mmol/L (ref 101–111)
Creatinine, Ser: 0.48 mg/dL (ref 0.44–1.00)
Creatinine, Ser: 0.63 mg/dL (ref 0.44–1.00)
GFR calc Af Amer: >60 mL/min (ref >60)
GFR calc non Af Amer: >60 mL/min (ref >60)
GLUCOSE: 74 mg/dL (ref 65–99)
Glucose, Bld: 71 mg/dL (ref 65–99)
POTASSIUM: 2.5 mmol/L — AB (ref 3.5–5.1)
Potassium: 3.3 mmol/L — ABNORMAL LOW (ref 3.5–5.1)
SODIUM: 138 mmol/L (ref 135–145)
Sodium: 135 mmol/L (ref 135–145)

## 2015-09-05 LAB — MAGNESIUM: Magnesium: 1.5 mg/dL — ABNORMAL LOW (ref 1.7–2.4)

## 2015-09-05 LAB — LACTIC ACID, PLASMA: LACTIC ACID, VENOUS: 0.8 mmol/L (ref 0.5–2.0)

## 2015-09-05 MED ORDER — MAGNESIUM SULFATE 2 GM/50ML IV SOLN
2.0000 g | Freq: Once | INTRAVENOUS | Status: AC
  Administered 2015-09-05: 2 g via INTRAVENOUS
  Filled 2015-09-05: qty 50

## 2015-09-05 MED ORDER — SODIUM CHLORIDE 0.9 % IV SOLN
1.0000 g | Freq: Once | INTRAVENOUS | Status: AC
  Administered 2015-09-05: 1 g via INTRAVENOUS
  Filled 2015-09-05: qty 1

## 2015-09-05 MED ORDER — VANCOMYCIN HCL IN DEXTROSE 750-5 MG/150ML-% IV SOLN
750.0000 mg | INTRAVENOUS | Status: DC
  Administered 2015-09-05 – 2015-09-06 (×2): 750 mg via INTRAVENOUS
  Filled 2015-09-05 (×3): qty 150

## 2015-09-05 MED ORDER — MORPHINE SULFATE (PF) 2 MG/ML IV SOLN
INTRAVENOUS | Status: AC
  Filled 2015-09-05: qty 1

## 2015-09-05 MED ORDER — POTASSIUM CHLORIDE 10 MEQ/100ML IV SOLN
10.0000 meq | INTRAVENOUS | Status: DC
  Filled 2015-09-05 (×4): qty 100

## 2015-09-05 MED ORDER — ACETAMINOPHEN 650 MG RE SUPP
650.0000 mg | RECTAL | Status: DC | PRN
  Administered 2015-09-05 – 2015-09-23 (×8): 650 mg via RECTAL
  Filled 2015-09-05 (×7): qty 1

## 2015-09-05 MED ORDER — POTASSIUM CHLORIDE 10 MEQ/100ML IV SOLN
10.0000 meq | INTRAVENOUS | Status: AC
  Administered 2015-09-05 (×4): 10 meq via INTRAVENOUS
  Filled 2015-09-05 (×4): qty 100

## 2015-09-05 MED ORDER — SODIUM CHLORIDE 0.9 % IV SOLN
1.0000 g | Freq: Once | INTRAVENOUS | Status: AC
  Administered 2015-09-05: 1 g via INTRAVENOUS
  Filled 2015-09-05: qty 10

## 2015-09-05 MED ORDER — SODIUM CHLORIDE 0.9 % IV SOLN
500.0000 mg | Freq: Two times a day (BID) | INTRAVENOUS | Status: DC
  Administered 2015-09-05 – 2015-09-23 (×37): 500 mg via INTRAVENOUS
  Filled 2015-09-05 (×40): qty 5

## 2015-09-05 MED ORDER — LORAZEPAM 2 MG/ML IJ SOLN
2.0000 mg | Freq: Four times a day (QID) | INTRAMUSCULAR | Status: DC | PRN
  Administered 2015-09-05 – 2015-09-06 (×3): 2 mg via INTRAVENOUS
  Filled 2015-09-05 (×3): qty 1

## 2015-09-05 MED ORDER — POTASSIUM CHLORIDE CRYS ER 20 MEQ PO TBCR
40.0000 meq | EXTENDED_RELEASE_TABLET | ORAL | Status: DC
  Filled 2015-09-05: qty 2

## 2015-09-05 NOTE — H&P (Addendum)
[facility] - [place] at [facility]   PATIENT NAME: Jennifer [name]    MR#:  [id number]  DATE OF BIRTH:  13-Feb-1961  DATE OF ADMISSION:  09/04/2015  PRIMARY CARE PHYSICIAN: Ruthe Mannan, MD   REQUESTING/REFERRING PHYSICIAN: Dr. Derrill Kay  CHIEF COMPLAINT:   Chief Complaint  Patient presents with  . Altered Mental Status    HISTORY OF PRESENT ILLNESS:  Jennifer [name] is a 55 y.o. female with a known history of ALS, chronic Foley catheter is brought to the emergency room by husband due to hallucinations, weakness and confusion. Patient does have psychiatric issues and generally gets agitated due to those. Patient has similar symptoms every time she gets a UTI. She does have ALS and is bedbound and has indwelling Foley catheter. Febrile. Urinalysis shows UTI. Tachycardic and is being admitted for UTI with sepsis and acute encephalopathy.  History obtained from reviewing old records and discussing with emergency room staff. Family not available at bedside. Patient unable to contribute to history for mental status change.  PAST MEDICAL HISTORY:   Past Medical History  Diagnosis Date  . ALS (amyotrophic lateral sclerosis) (HCC)   . Depression   . GERD (gastroesophageal reflux disease)   . Urinary retention   . Dysphagia, oropharyngeal 11/28/2013  . CN (constipation) 11/28/2013    PAST SURGICAL HISTORY:   Past Surgical History  Procedure Laterality Date  . Cesarean section    . Nasal sinus surgery      SOCIAL HISTORY:   Social History  Substance Use Topics  . Smoking status: Former Games developer  . Smokeless tobacco: Never Used  . Alcohol Use: No    FAMILY HISTORY:   Family History  Problem Relation Age of Onset  . COPD Mother   . Thyroid disease Mother   . Diverticulosis Mother   . Heart attack Mother   . Gout Father   . Depression Sister   . Thyroid disease Sister   . Cancer Brother     kidney  . Heart attack Maternal Grandmother   . Heart  attack Maternal Grandfather   . Heart attack Paternal Grandfather   . Hyperlipidemia Sister   . Evelene Croon Parkinson White syndrome Sister 25  . Depression Brother   . Hypertension Brother     DRUG ALLERGIES:   Allergies  Allergen Reactions  . Bee Venom Anaphylaxis  . Hydrocodone Other (See Comments)    Reaction:  Muscle stiffness   . Lipitor [Atorvastatin] Other (See Comments)    Reaction:  Muscle cramps   . Shellfish Allergy Anaphylaxis  . Oxycodone Other (See Comments)    Reaction:  Muscle stiffness   . Prednisone Other (See Comments)    Reaction:  Psychosis   . Vicodin [Hydrocodone-Acetaminophen] Other (See Comments)    Reaction:  Muscle stiffness   . Oxycontin [Oxycodone Hcl] Other (See Comments)    Reaction:  Muscle stiffness   . Peanut-Containing Drug Products Diarrhea    REVIEW OF SYSTEMS:   Review of Systems  Unable to perform ROS: mental status change    MEDICATIONS AT HOME:   Prior to Admission medications   Medication Sig Start Date End Date Taking? Authorizing Provider  ARIPiprazole (ABILIFY) 5 MG tablet TAKE 1 TABLET (5 MG TOTAL) BY MOUTH DAILY. 07/15/15  Yes [name], MD  atropine 1 % ophthalmic solution Place 2 drops under the tongue as needed (for excessive secretions).   Yes Historical Provider, MD  buPROPion (WELLBUTRIN SR) 150 MG 12 hr tablet Take  300 mg by mouth 2 (two) times daily.   Yes Historical Provider, MD  cholecalciferol (VITAMIN D) 1000 UNITS tablet Take 1,000 Units by mouth daily.   Yes Historical Provider, MD  Dextromethorphan-Quinidine (NUEDEXTA) 20-10 MG CAPS Take 1 capsule by mouth 2 (two) times daily.    Yes Historical Provider, MD  diazepam (VALIUM) 10 MG tablet Take 1 tablet (10 mg total) by mouth every 6 (six) hours as needed. 08/19/15  Yes [name], MD  fluticasone (FLONASE) 50 MCG/ACT nasal spray Place 2 sprays into both nostrils daily as needed for rhinitis.   Yes Historical Provider, MD  fosfomycin (MONUROL) 3 G PACK Take 3 g  by mouth every other day. 06/29/15  Yes Enedina Finner, MD  ibuprofen (ADVIL,MOTRIN) 200 MG tablet Take 800 mg by mouth 3 (three) times daily as needed for mild pain.    Yes Historical Provider, MD  lactulose (CHRONULAC) 10 GM/15ML solution Take 15 mLs (10 g total) by mouth 3 (three) times daily. Patient taking differently: Take 10 g by mouth 3 (three) times daily as needed for mild constipation.  02/27/15  Yes Rebecka Apley, MD  levETIRAcetam (KEPPRA) 500 MG tablet Take 500 mg by mouth 2 (two) times daily.    Yes Historical Provider, MD  loratadine (CLARITIN) 10 MG tablet Take 10 mg by mouth daily as needed for allergies.    Yes Historical Provider, MD  omeprazole (PRILOSEC) 40 MG capsule Take 40 mg by mouth daily.    Yes Historical Provider, MD  potassium chloride 20 MEQ/15ML (10%) SOLN Take 20 mEq by mouth daily.   Yes Historical Provider, MD  ranitidine (ZANTAC) 150 MG tablet Take 150 mg by mouth daily as needed for heartburn.   Yes Historical Provider, MD  tizanidine (ZANAFLEX) 2 MG capsule Take 2 mg by mouth at bedtime.    Yes Historical Provider, MD  ciprofloxacin (CIPRO) 500 MG tablet Take 1 tablet (500 mg total) by mouth 2 (two) times daily. Patient not taking: Reported on 09/04/2015 08/15/15   [name], MD      VITAL SIGNS:  Blood pressure 114/65, pulse 99, temperature 100.6 F (38.1 C), temperature source Axillary, resp. rate 18, height 4\' 11"  (1.499 m), weight 44.453 kg (98 lb), SpO2 98 %.  PHYSICAL EXAMINATION:  Physical Exam  GENERAL:  55 y.o.-year-old patient lying in the bed with no acute distress. Thin EYES: Pupils equal, round, reactive to light and accommodation. No scleral icterus.   HEENT: Head atraumatic, normocephalic.  No oropharyngeal erythema, moist oral dry NECK:  Supple, no jugular venous distention. No thyroid enlargement, no tenderness.  LUNGS: Normal breath sounds bilaterally, no wheezing, rales, rhonchi. No use of accessory muscles of respiration.   CARDIOVASCULAR: S1, S2 normal. No murmurs, rubs, or gallops.  ABDOMEN: Soft, nontender, nondistended. Bowel sounds present. No organomegaly or mass.  EXTREMITIES: No pedal edema, cyanosis, or clubbing. + 2 pedal & radial pulses b/l.  Diffuse muscle aches NEUROLOGIC: Slumped to the right side. Does not follow commands. PSYCHIATRIC: The patient is drowsy. SKIN: No obvious rash, lesion, or ulcer.   LABORATORY PANEL:   CBC  Recent Labs Lab 09/04/15 2101  WBC 11.3*  HGB 11.1*  HCT 32.6*  PLT 229   ------------------------------------------------------------------------------------------------------------------  Chemistries   Recent Labs Lab 09/04/15 2101  NA 136  K 3.1*  CL 100*  CO2 26  GLUCOSE 82  BUN 15  CREATININE 0.69  CALCIUM 9.0  AST 20  ALT 11*  ALKPHOS 51  BILITOT 0.9   ------------------------------------------------------------------------------------------------------------------  Cardiac Enzymes  Recent Labs Lab 09/04/15 2101  TROPONINI 0.03   ------------------------------------------------------------------------------------------------------------------  RADIOLOGY:  Dg Chest Port 1 View  09/04/2015  CLINICAL DATA:  Acute onset of fever and altered mental status. Initial encounter. EXAM: PORTABLE CHEST 1 VIEW COMPARISON:  Chest radiograph performed 06/26/2015 FINDINGS: The lungs are well-aerated. Mild peribronchial thickening is noted. There is no evidence of focal opacification, pleural effusion or pneumothorax. The cardiomediastinal silhouette is within normal limits. No acute osseous abnormalities are seen. IMPRESSION: Mild peribronchial thickening noted.  Lungs otherwise grossly clear. Electronically Signed   By: Roanna Raider M.D.   On: 09/04/2015 22:15     IMPRESSION AND PLAN:   * UTI with sepsis due to indwelling catheter Patient has had multiple resistant bacteria in the past including ESBL Escherichia coli and VRE. Patient has been  started on vancomycin and Invanz. Urine culture sent. IV fluids for sepsis. Depending on culture results might have to consult infectious disease.  Patient is presently nothing by mouth due to being drowsy. Needs to be started on a diet once more awake.  * Acute encephalopathy due to UTI  * Bipolar disorder Continue home medications  * Amyotropic lateral sclerosis Bedbound at baseline  * Seizures As patient is nothing by mouth Keppra ordered IV  * DVT prophylaxis with low    All the records are reviewed and case discussed with ED provider. Management plans discussed with the patient, family and they are in agreement.  CODE STATUS: FULL  TOTAL TIME TAKING CARE OF THIS PATIENT: 40 minutes.    Milagros Loll R M.D on 09/05/2015 at 1:11 AM  Between 7am to 6pm - Pager - [phone]  After 6pm go to [email or web address] - password [email or web address]  [facility]  Office  [phone]  CC: Primary care physician; Ruthe Mannan, MD     Note: This dictation was prepared with Dragon dictation along with smaller phrase technology. Any transcriptional errors that result from this process are unintentional.

## 2015-09-05 NOTE — Progress Notes (Signed)
Initial Nutrition Assessment  DOCUMENTATION CODES:   Severe malnutrition in context of chronic illness  INTERVENTION:  Meals and snacks: Await diet progression, pt may benefit from SLP evaluation Medical Nutrition Supplement Therapy: Recommend adding magic cup BID once diet able to be progressed   NUTRITION DIAGNOSIS:   Inadequate oral intake related to acute illness as evidenced by NPO status.    GOAL:   Patient will meet greater than or equal to 90% of their needs    MONITOR:    (Energy intake)  REASON FOR ASSESSMENT:   Malnutrition Screening Tool    ASSESSMENT:      Pt admitted with AMS, UTI  Past Medical History  Diagnosis Date  . ALS (amyotrophic lateral sclerosis) (HCC)   . Depression   . GERD (gastroesophageal reflux disease)   . Urinary retention   . Dysphagia, oropharyngeal 11/28/2013  . CN (constipation) 11/28/2013    Current Nutrition: NPO  Food/Nutrition-Related History: Caregiver at bedside and reports poor po intake for the past 2 weeks   Scheduled Medications:  . enoxaparin (LOVENOX) injection  40 mg Subcutaneous Q24H  . [START ON 09/06/2015] ertapenem  1 g Intravenous Q24H  . levETIRAcetam  500 mg Intravenous Q12H  . vancomycin  750 mg Intravenous Q18H    Continuous Medications:  . 0.9 % NaCl with KCl 20 mEq / L 75 mL/hr at 09/05/15 0624     Electrolyte/Renal Profile and Glucose Profile:   Recent Labs Lab 09/04/15 2101 09/05/15 0519  NA 136 138  K 3.1* 2.5*  CL 100* 105  CO2 26 25  BUN 15 12  CREATININE 0.69 0.48  CALCIUM 9.0 8.1*  GLUCOSE 82 74   Protein Profile:   Recent Labs Lab 09/04/15 2101  ALBUMIN 3.7    Gastrointestinal Profile: Last BM: unknown   Nutrition-Focused Physical Exam Findings: Nutrition-Focused physical exam completed. Findings are moderate fat depletion, moderate severemuscle depletion, and unable to assess edema.     Weight Change: 34% wt loss in the last 4 months per wt  encounters    Diet Order:  Diet NPO time specified  Skin:   pressure ulcer noted unstageable buttocks   Height:   Ht Readings from Last 1 Encounters:  09/05/15  (1.499 m)    Weight:   Wt Readings from Last 1 Encounters:  09/05/15 79 lb (35.834 kg)    Ideal Body Weight:     BMI:  Body mass index is 15.95 kg/(m^2).  Estimated Nutritional Needs:   Kcal:  BEE 865 kcals (IF 1.1-1.3, AF 1.2) 0981-1914 kcals/d  Protein:  (1.2-1.5 g/kg) 43-54 g/d  Fluid:  (25-66ml//kg) 900-1079ml/d  EDUCATION NEEDS:   No education needs identified at this time  HIGH Care Level  Kazim Corrales B. Freida Busman, RD, LDN [phone] (pager) Weekend/On-Call pager [phone])

## 2015-09-05 NOTE — Care Management Note (Signed)
Case Management Note  Patient Details  Name: Jennifer [name] MRN: [id number] Date of Birth: 1960-09-04  Subjective/Objective:                  Met with patient and her privately paid caregiver sitting at patient bedside. Patient is difficult to understand however she nods yes and no when asked these type of questions. She is from home with her husband. She has private duty the majority of the day. She has home health CNA and RN through [facility] according to sitter. She has a wheelchair for mobility. Patient is primarily bedbound. EMS is needed for transportation per caregiver.   Action/Plan: I have notified Corene Cornea with [facility] of patient admission. EMS may be needed. I have asked caregiver to have husband call me. RNCM will continue to follow.   Expected Discharge Date:                  Expected Discharge Plan:     In-House Referral:     Discharge planning Services  CM Consult  Post Acute Care Choice:    Choice offered to:  Patient (caregiver)  DME Arranged:    DME Agency:     HH Arranged:  RN, NA [place] Agency:  [facility]  Status of Service:  In process, will continue to follow  Medicare Important Message Given:    Date Medicare IM Given:    Medicare IM give by:    Date Additional Medicare IM Given:    Additional Medicare Important Message give by:     If discussed at [place] of Stay Meetings, dates discussed:    Additional Comments:  [name], RN 09/05/2015, 8:51 AM

## 2015-09-05 NOTE — Progress Notes (Signed)
[facility] - [place] at [facility]   PATIENT NAME: Jennifer [name]    MR#:  [id number]  DATE OF BIRTH:  06/20/1961  SUBJECTIVE:  CHIEF COMPLAINT:   Chief Complaint  Patient presents with  . Altered Mental Status   - Patient has ALS at baseline. She is ready communicated to and alert according to husband. She has 24-hour care at home. -Currently she is very drowsy arousable responds to her name and follows some simple commands. No fevers noted this morning. She had temperature of 10 43F last night.  REVIEW OF SYSTEMS:  Review of Systems  Unable to perform ROS: mental acuity    DRUG ALLERGIES:   Allergies  Allergen Reactions  . Bee Venom Anaphylaxis  . Hydrocodone Other (See Comments)    Reaction:  Muscle stiffness   . Lipitor [Atorvastatin] Other (See Comments)    Reaction:  Muscle cramps   . Shellfish Allergy Anaphylaxis  . Oxycodone Other (See Comments)    Reaction:  Muscle stiffness   . Prednisone Other (See Comments)    Reaction:  Psychosis   . Vicodin [Hydrocodone-Acetaminophen] Other (See Comments)    Reaction:  Muscle stiffness   . Oxycontin [Oxycodone Hcl] Other (See Comments)    Reaction:  Muscle stiffness   . Peanut-Containing Drug Products Diarrhea    VITALS:  Blood pressure 114/56, pulse 95, temperature 98.1 F (36.7 C), temperature source Oral, resp. rate 18, height  (1.499 m), weight 35.834 kg (79 lb), SpO2 96 %.  PHYSICAL EXAMINATION:  Physical Exam  GENERAL:  55 y.o.-year-old patient lying in the bed with no acute distress. Patient has ALS. EYES: Pupils equal, round, reactive to light and accommodation. No scleral icterus. Extraocular muscles intact.  HEENT: Head atraumatic, normocephalic. Oropharynx and nasopharynx clear.  NECK:  Supple, no jugular venous distention. No thyroid enlargement, no tenderness.  LUNGS: Normal breath sounds bilaterally, no wheezing, rales,rhonchi or crepitation. No use of accessory muscles  of respiration. Decreased bibasilar breath sounds CARDIOVASCULAR: S1, S2 normal. No murmurs, rubs, or gallops.  ABDOMEN: Soft, nontender, nondistended. Bowel sounds present. No organomegaly or mass.  EXTREMITIES: No pedal edema, cyanosis, or clubbing.  NEUROLOGIC: Sleeping, arousable and following some simple commands. Nodding head to some questions. PSYCHIATRIC: The patient is responding to name only and following some simple commands.  SKIN: No obvious rash, lesion, or ulcer.    LABORATORY PANEL:   CBC  Recent Labs Lab 09/05/15 0519  WBC 11.7*  HGB 10.1*  HCT 29.6*  PLT 195   ------------------------------------------------------------------------------------------------------------------  Chemistries   Recent Labs Lab 09/04/15 2101 09/05/15 0519  NA 136 138  K 3.1* 2.5*  CL 100* 105  CO2 26 25  GLUCOSE 82 74  BUN 15 12  CREATININE 0.69 0.48  CALCIUM 9.0 8.1*  AST 20  --   ALT 11*  --   ALKPHOS 51  --   BILITOT 0.9  --    ------------------------------------------------------------------------------------------------------------------  Cardiac Enzymes  Recent Labs Lab 09/04/15 2101  TROPONINI 0.03   ------------------------------------------------------------------------------------------------------------------  RADIOLOGY:  Dg Chest Port 1 View  09/04/2015  CLINICAL DATA:  Acute onset of fever and altered mental status. Initial encounter. EXAM: PORTABLE CHEST 1 VIEW COMPARISON:  Chest radiograph performed 06/26/2015 FINDINGS: The lungs are well-aerated. Mild peribronchial thickening is noted. There is no evidence of focal opacification, pleural effusion or pneumothorax. The cardiomediastinal silhouette is within normal limits. No acute osseous abnormalities are seen. IMPRESSION: Mild peribronchial thickening noted.  Lungs otherwise grossly clear.  Electronically Signed   By: Roanna Raider M.D.   On: 09/04/2015 22:15    EKG:   Orders placed or performed  during the hospital encounter of 09/04/15  . ED EKG 12-Lead  . ED EKG 12-Lead    ASSESSMENT AND PLAN:   55 year old female with known history of ALS with chronic Foley catheter and prior history of multiple UTIs, depression, GERD was brought in secondary to altered mental status.  #1 metabolic encephalopathy-secondary to sepsis with UTI -CT of the head nontender admission. If not improving in the next 24 hours, will get a CT head -Continue fluids and antibiotics awaiting cultures at this time.  #2 sepsis-secondary to acute cystitis -As chronic indwelling catheter and prior history of UTIs with multidrug resistant bacteria including ESBL and also BRAT. -Continue broad-spectrum antibiotics at this time with vancomycin and Invanz. Follow up cultures.  #3 hypokalemia-known history of chronic hypokalemia and takes potassium supplements at home. Currently nothing by mouth due to her mental status, so we'll start IV potassium and recheck it. Pharmacy will be consulted to adjust electrolytes.  #4 seizure disorder-continue Keppra IV this time.  #5 ALS-following with neurologist at [facility]. -Bed bound at baseline, but still able to sit with assistance and feed herself and alert and interactive. -Continue to monitor as patient is not close to her baseline.  #6 DVT prophylaxis-on Lovenox   All the records are reviewed and case discussed with Care Management/Social Workerr. Management plans discussed with the patient, family and they are in agreement.  CODE STATUS: Full Code  TOTAL TIME TAKING CARE OF THIS PATIENT: 38 minutes.   POSSIBLE D/C IN 2 DAYS, DEPENDING ON CLINICAL CONDITION.   Leevon Upperman M.D on 09/05/2015 at 3:40 PM  Between 7am to 6pm - Pager - [phone]  After 6pm go to [email or web address] - password [email or web address] [facility]  [facility]  Office  [phone]  CC: Primary care physician; Ruthe Mannan, MD

## 2015-09-05 NOTE — Progress Notes (Signed)
Pts. Potassium level is 2.5. Dr. Sheryle Hail ordered PO potassium.

## 2015-09-05 NOTE — ED Notes (Signed)
husb and caregiver at bedside; updated on plan of care

## 2015-09-05 NOTE — ED Notes (Addendum)
Invanz ordered for pt but still not received from pharmacy; called to obtain and will send to [facility]; called [facility] to give report and st nurse busy with transfer & will call back; pt with foley leg bag in place; removed & large foley bag replaced

## 2015-09-05 NOTE — Progress Notes (Signed)
Pharmacy Antibiotic Note  Jennifer [name] is a 55 y.o. female admitted on 09/04/2015 with UTI/sepsis.  Pharmacy has been consulted for vancomycin dosing.  Plan: UA: LE(+) NO2(-) WBC TNTC Blood and urine cx pending CXR: Clear  TBW 44.5kg  IBW 43.2kg DW 44.5kg  Vd 31L kei 0.05 hr-1  T1/2 14 hours Vancomycin 750 mg q 18 hours ordered with stacked dosing.  Level before 5th dose. Goal trough 15-20.  Height:  (149.9 cm) Weight: 98 lb (44.453 kg) IBW/kg (Calculated) : 43.2  Temp (24hrs), Avg:100.2 F (37.9 C), Min:98.5 F (36.9 C), Max:101.6 F (38.7 C)   Recent Labs Lab 09/04/15 2101  WBC 11.3*  CREATININE 0.69  LATICACIDVEN 2.1*    Estimated Creatinine Clearance: 54.8 mL/min (by C-G formula based on Cr of 0.69).    Allergies  Allergen Reactions  . Bee Venom Anaphylaxis  . Hydrocodone Other (See Comments)    Reaction:  Muscle stiffness   . Lipitor [Atorvastatin] Other (See Comments)    Reaction:  Muscle cramps   . Shellfish Allergy Anaphylaxis  . Oxycodone Other (See Comments)    Reaction:  Muscle stiffness   . Prednisone Other (See Comments)    Reaction:  Psychosis   . Vicodin [Hydrocodone-Acetaminophen] Other (See Comments)    Reaction:  Muscle stiffness   . Oxycontin [Oxycodone Hcl] Other (See Comments)    Reaction:  Muscle stiffness   . Peanut-Containing Drug Products Diarrhea    Antimicrobials this admission:   Dose adjustments this admission:   Microbiology results:  BCx:   UCx:    Sputum:    MRSA PCR:   Thank you for allowing pharmacy to be a part of this patient's care.  Patricia Perales S 09/05/2015 2:50 AM

## 2015-09-05 NOTE — Progress Notes (Signed)
Dr Nemiah Commander called and notified of potassium level 2.5, potassium PO was ordered and patient is NPO. PO potassium discontinued and new orders placed for IV potassium. Continue to monitor.

## 2015-09-05 NOTE — Progress Notes (Signed)
MEDICATION RELATED CONSULT NOTE - INITIAL   Pharmacy Consult for Electrolytes Indication: Calcium , Potassium   Allergies  Allergen Reactions  . Bee Venom Anaphylaxis  . Hydrocodone Other (See Comments)    Reaction:  Muscle stiffness   . Lipitor [Atorvastatin] Other (See Comments)    Reaction:  Muscle cramps   . Shellfish Allergy Anaphylaxis  . Oxycodone Other (See Comments)    Reaction:  Muscle stiffness   . Prednisone Other (See Comments)    Reaction:  Psychosis   . Vicodin [Hydrocodone-Acetaminophen] Other (See Comments)    Reaction:  Muscle stiffness   . Oxycontin [Oxycodone Hcl] Other (See Comments)    Reaction:  Muscle stiffness   . Peanut-Containing Drug Products Diarrhea    Patient Measurements: Height:  (149.9 cm) Weight: 79 lb (35.834 kg) IBW/kg (Calculated) : 43.2 Adjusted Body Weight:   Vital Signs: Temp: 99.2 F (37.3 C) (02/09 1635) Temp Source: Oral (02/09 1635) BP: 115/60 mmHg (02/09 1635) Pulse Rate: 93 (02/09 1635) Intake/Output from previous day: 02/08 0701 - 02/09 0700 In: -  Out: 180 [Urine:180] Intake/Output from this shift:    Labs:  Recent Labs  09/04/15 2101 09/05/15 0519 09/05/15 1751  WBC 11.3* 11.7*  --   HGB 11.1* 10.1*  --   HCT 32.6* 29.6*  --   PLT 229 195  --   CREATININE 0.69 0.48 0.63  MG  --   --  1.5*  ALBUMIN 3.7  --   --   PROT 6.7  --   --   AST 20  --   --   ALT 11*  --   --   ALKPHOS 51  --   --   BILITOT 0.9  --   --    Estimated Creatinine Clearance: 45.4 mL/min (by C-G formula based on Cr of 0.63).   Microbiology: No results found for this or any previous visit (from the past 720 hour(s)).  Medical History: Past Medical History  Diagnosis Date  . ALS (amyotrophic lateral sclerosis) (HCC)   . Depression   . GERD (gastroesophageal reflux disease)   . Urinary retention   . Dysphagia, oropharyngeal 11/28/2013  . CN (constipation) 11/28/2013    Medications:  Scheduled:  . calcium gluconate  1 g  Intravenous Once  . enoxaparin (LOVENOX) injection  40 mg Subcutaneous Q24H  . [START ON 09/06/2015] ertapenem  1 g Intravenous Q24H  . levETIRAcetam  500 mg Intravenous Q12H  . potassium chloride  10 mEq Intravenous Q1 Hr x 4  . vancomycin  750 mg Intravenous Q18H    Assessment: Pharmacy consulted to manage electrolytes in this 55 year old female. 2/09 @ 18:00 :   K = 3.3                           Mag = 1.5                           Ca = 8.3 ,  Albumin = 3.7                           Corrected Ca = 8.54  Goal of Therapy:  K:  3.5 - 5.1 Ca:  8.9 - 10.3 Mag: 1.7 - 2.4   Plan:  Will order KCl 10 mEq IV X 4. Will order Calcium gluconate 1 gm IV X 1. Will  order Magnesium Sulfate 2 gm IV X 1. Will recheck electrolytes on 2/10 with AM labs.    Jennifer [name] D 09/05/2015,7:05 PM

## 2015-09-06 ENCOUNTER — Other Ambulatory Visit: Payer: Self-pay | Admitting: Family Medicine

## 2015-09-06 ENCOUNTER — Inpatient Hospital Stay: Payer: BLUE CROSS/BLUE SHIELD

## 2015-09-06 LAB — BASIC METABOLIC PANEL
ANION GAP: 10 (ref 5–15)
BUN: 10 mg/dL (ref 6–20)
CHLORIDE: 107 mmol/L (ref 101–111)
CO2: 19 mmol/L — AB (ref 22–32)
Calcium: 8.2 mg/dL — ABNORMAL LOW (ref 8.9–10.3)
Creatinine, Ser: 0.58 mg/dL (ref 0.44–1.00)
GFR calc non Af Amer: >60 mL/min (ref >60)
GLUCOSE: 75 mg/dL (ref 65–99)
POTASSIUM: 3.6 mmol/L (ref 3.5–5.1)
Sodium: 136 mmol/L (ref 135–145)

## 2015-09-06 LAB — URINE CULTURE

## 2015-09-06 LAB — URINALYSIS COMPLETE WITH MICROSCOPIC (ARMC ONLY)
Bilirubin Urine: NEGATIVE
Glucose, UA: NEGATIVE mg/dL
NITRITE: NEGATIVE
PH: 5 (ref 5.0–8.0)
PROTEIN: 100 mg/dL — AB
SPECIFIC GRAVITY, URINE: 1.025 (ref 1.005–1.030)

## 2015-09-06 LAB — CBC
HEMATOCRIT: 30 % — AB (ref 35.0–47.0)
HEMOGLOBIN: 10.4 g/dL — AB (ref 12.0–16.0)
MCH: 31.4 pg (ref 26.0–34.0)
MCHC: 34.7 g/dL (ref 32.0–36.0)
MCV: 90.4 fL (ref 80.0–100.0)
Platelets: 238 10*3/uL (ref 150–440)
RBC: 3.32 MIL/uL — AB (ref 3.80–5.20)
RDW: 14.1 % (ref 11.5–14.5)
WBC: 15.2 10*3/uL — ABNORMAL HIGH (ref 3.6–11.0)

## 2015-09-06 LAB — INFLUENZA PANEL BY PCR (TYPE A & B)
H1N1 flu by pcr: NOT DETECTED
INFLBPCR: NEGATIVE
Influenza A By PCR: NEGATIVE

## 2015-09-06 MED ORDER — DIAZEPAM 5 MG/ML IJ SOLN
5.0000 mg | Freq: Four times a day (QID) | INTRAMUSCULAR | Status: DC | PRN
  Administered 2015-09-06 – 2015-09-07 (×2): 5 mg via INTRAVENOUS
  Filled 2015-09-06 (×2): qty 2

## 2015-09-06 MED ORDER — ENOXAPARIN SODIUM 30 MG/0.3ML ~~LOC~~ SOLN
30.0000 mg | SUBCUTANEOUS | Status: DC
  Administered 2015-09-06 – 2015-09-12 (×7): 30 mg via SUBCUTANEOUS
  Filled 2015-09-06 (×7): qty 0.3

## 2015-09-06 MED ORDER — IBUPROFEN 200 MG PO TABS
400.0000 mg | ORAL_TABLET | Freq: Four times a day (QID) | ORAL | Status: DC | PRN
  Administered 2015-09-06 – 2015-09-22 (×3): 400 mg via ORAL
  Filled 2015-09-06: qty 2
  Filled 2015-09-06 (×2): qty 1

## 2015-09-06 MED ORDER — SODIUM CHLORIDE 0.9 % IV SOLN
1.0000 g | Freq: Two times a day (BID) | INTRAVENOUS | Status: DC
  Administered 2015-09-06 – 2015-09-08 (×5): 1 g via INTRAVENOUS
  Filled 2015-09-06 (×7): qty 1

## 2015-09-06 MED ORDER — SODIUM CHLORIDE 0.9 % IV SOLN
200.0000 mg | INTRAVENOUS | Status: DC
  Administered 2015-09-06 – 2015-09-08 (×3): 200 mg via INTRAVENOUS
  Filled 2015-09-06 (×5): qty 4

## 2015-09-06 MED ORDER — SODIUM CHLORIDE 0.9 % IV SOLN
1.0000 g | Freq: Once | INTRAVENOUS | Status: DC
  Filled 2015-09-06: qty 10

## 2015-09-06 NOTE — Telephone Encounter (Signed)
Spoke to pt husband who insisted pt is still taking medication.  She is currently at [facility], as of Wednesday, and has not been receiving it. Spoke to pharmacy who states at some pt d/c medication, and must have restarted it w/o notification. Pharmacist states that the Rx that the pt filled in Jan 2017, was left from an Oct 2016 request. She also has an additional 5 refills, from a previous request. Is pt needing to continue medication, or should it remain d/c? pls advise

## 2015-09-06 NOTE — Telephone Encounter (Signed)
wellbutrin is no longer on pts medication list. pls advise

## 2015-09-06 NOTE — Progress Notes (Signed)
Husband concerned that pt is running fevers.  Dr Sampson Goon notified.  Changes made to abx

## 2015-09-06 NOTE — Progress Notes (Signed)
[facility] - [place] at [facility]   PATIENT NAME: Jennifer [name]    MR#:  [id number]  DATE OF BIRTH:  September 28, 1960  SUBJECTIVE:  CHIEF COMPLAINT:   Chief Complaint  Patient presents with  . Altered Mental Status   - More alert, but speech is not understandable today - appears very weak and malnourished - modified barium swallow study today - still spiking fevers  REVIEW OF SYSTEMS:  Review of Systems  Unable to perform ROS: mental acuity    DRUG ALLERGIES:   Allergies  Allergen Reactions  . Bee Venom Anaphylaxis  . Hydrocodone Other (See Comments)    Reaction:  Muscle stiffness   . Lipitor [Atorvastatin] Other (See Comments)    Reaction:  Muscle cramps   . Shellfish Allergy Anaphylaxis  . Oxycodone Other (See Comments)    Reaction:  Muscle stiffness   . Prednisone Other (See Comments)    Reaction:  Psychosis   . Vicodin [Hydrocodone-Acetaminophen] Other (See Comments)    Reaction:  Muscle stiffness   . Oxycontin [Oxycodone Hcl] Other (See Comments)    Reaction:  Muscle stiffness   . Peanut-Containing Drug Products Diarrhea    VITALS:  Blood pressure 126/78, pulse 100, temperature 99.2 F (37.3 C), temperature source Oral, resp. rate 17, height  (1.499 m), weight 35.68 kg (78 lb 10.6 oz), SpO2 98 %.  PHYSICAL EXAMINATION:  Physical Exam  GENERAL:  55 y.o.-year-old patient lying in the bed with no acute distress. Patient has ALS. EYES: Pupils equal, round, reactive to light and accommodation. No scleral icterus. Extraocular muscles intact.  HEENT: Head atraumatic, normocephalic. Oropharynx and nasopharynx clear.  NECK:  Supple, no jugular venous distention. No thyroid enlargement, no tenderness.  LUNGS: Normal breath sounds bilaterally, no wheezing, rales,rhonchi or crepitation. No use of accessory muscles of respiration. Decreased bibasilar breath sounds CARDIOVASCULAR: S1, S2 normal. No murmurs, rubs, or gallops.  ABDOMEN: Soft,  nontender, nondistended. Bowel sounds present. No organomegaly or mass.  EXTREMITIES: No pedal edema, cyanosis, or clubbing.  NEUROLOGIC: Lying in bed, contracted appearing, mumbling- speech not understandable Not following commands PSYCHIATRIC: The patient is responding to name only and alert today.  SKIN: No obvious rash, lesion, or ulcer.    LABORATORY PANEL:   CBC  Recent Labs Lab 09/06/15 0513  WBC 15.2*  HGB 10.4*  HCT 30.0*  PLT 238   ------------------------------------------------------------------------------------------------------------------  Chemistries   Recent Labs Lab 09/04/15 2101  09/05/15 1751 09/06/15 0513  NA 136  < > 135 136  K 3.1*  < > 3.3* 3.6  CL 100*  < > 105 107  CO2 26  < > 21* 19*  GLUCOSE 82  < > 71 75  BUN 15  < > 8 10  CREATININE 0.69  < > 0.63 0.58  CALCIUM 9.0  < > 8.3* 8.2*  MG  --   --  1.5*  --   AST 20  --   --   --   ALT 11*  --   --   --   ALKPHOS 51  --   --   --   BILITOT 0.9  --   --   --   < > = values in this interval not displayed. ------------------------------------------------------------------------------------------------------------------  Cardiac Enzymes  Recent Labs Lab 09/04/15 2101  TROPONINI 0.03   ------------------------------------------------------------------------------------------------------------------  RADIOLOGY:  Dg Chest Port 1 View  09/04/2015  CLINICAL DATA:  Acute onset of fever and altered mental status. Initial encounter. EXAM:  PORTABLE CHEST 1 VIEW COMPARISON:  Chest radiograph performed 06/26/2015 FINDINGS: The lungs are well-aerated. Mild peribronchial thickening is noted. There is no evidence of focal opacification, pleural effusion or pneumothorax. The cardiomediastinal silhouette is within normal limits. No acute osseous abnormalities are seen. IMPRESSION: Mild peribronchial thickening noted.  Lungs otherwise grossly clear. Electronically Signed   By: Roanna Raider M.D.   On:  09/04/2015 22:15    EKG:   Orders placed or performed during the hospital encounter of 09/04/15  . ED EKG 12-Lead  . ED EKG 12-Lead    ASSESSMENT AND PLAN:   55 year old female with known history of ALS with chronic Foley catheter and prior history of multiple UTIs, depression, GERD was brought in secondary to altered mental status.  #1 Metabolic encephalopathy-secondary to sepsis with UTI -CT of the head nontender admission. If not improving in the next 24 hours, will get a CT head -Continue fluids and antibiotics awaiting cultures at this time.  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 -Continue broad-spectrum antibiotics at this time with vancomycin and Invanz. Follow up cultures.  #3 hypokalemia-known history of chronic hypokalemia and takes potassium supplements at home. Currently nothing by mouth due to her mental status, so on IV potassium and recheck it. Pharmacy consulted to adjust electrolytes.  #4 seizure disorder-continue Keppra IV this time.  #5 ALS-following with neurologist at [facility]. -Bed bound at baseline, but still able to sit with assistance and feed herself and alert and interactive. -Continue to monitor as patient is not close to her baseline.  #6 Dysphagia- MBSS today, at baseline per husband- no difficulty Keep NPO until then.  #7 Severe Malnutrition- dietary consult once able to eat by mouth Dietary supplements recommended  #7 DVT prophylaxis-on Lovenox   All the records are reviewed and case discussed with Care Management/Social Workerr. Management plans discussed with the patient, family and they are in agreement.  CODE STATUS: Full Code  TOTAL TIME TAKING CARE OF THIS PATIENT: 37 minutes.   POSSIBLE D/C IN 2-3 DAYS, DEPENDING ON CLINICAL CONDITION.   Saveon Plant  M.D on 09/06/2015 at 2:01 PM  Between 7am to 6pm - Pager - [phone]  After 6pm go to [email or web address] - password [email or web address] [facility]  [facility]  Office  [phone]  CC: Primary care physician; Ruthe Mannan, MD

## 2015-09-06 NOTE — Progress Notes (Signed)
MEDICATION RELATED CONSULT NOTE - INITIAL   Pharmacy Consult for Electrolytes Indication: Calcium , Potassium   Allergies  Allergen Reactions  . Bee Venom Anaphylaxis  . Hydrocodone Other (See Comments)    Reaction:  Muscle stiffness   . Lipitor [Atorvastatin] Other (See Comments)    Reaction:  Muscle cramps   . Shellfish Allergy Anaphylaxis  . Oxycodone Other (See Comments)    Reaction:  Muscle stiffness   . Prednisone Other (See Comments)    Reaction:  Psychosis   . Vicodin [Hydrocodone-Acetaminophen] Other (See Comments)    Reaction:  Muscle stiffness   . Oxycontin [Oxycodone Hcl] Other (See Comments)    Reaction:  Muscle stiffness   . Peanut-Containing Drug Products Diarrhea    Patient Measurements: Height:  (149.9 cm) Weight: 78 lb 10.6 oz (35.68 kg) IBW/kg (Calculated) : 43.2 Adjusted Body Weight:   Vital Signs: Temp: 101.1 F (38.4 C) (02/10 0535) Temp Source: Axillary (02/09 2037) BP: 103/61 mmHg (02/10 0535) Pulse Rate: 101 (02/10 0535) Intake/Output from previous day: 02/09 0701 - 02/10 0700 In: 841.3 [I.V.:181.3; IV Piggyback:660] Out: 1250 [Urine:1250] Intake/Output from this shift: Total I/O In: 660 [IV Piggyback:660] Out: 250 [Urine:250]  Labs:  Recent Labs  09/04/15 2101 09/05/15 0519 09/05/15 1751 09/06/15 0513  WBC 11.3* 11.7*  --  15.2*  HGB 11.1* 10.1*  --  10.4*  HCT 32.6* 29.6*  --  30.0*  PLT 229 195  --  238  CREATININE 0.69 0.48 0.63 0.58  MG  --   --  1.5*  --   ALBUMIN 3.7  --   --   --   PROT 6.7  --   --   --   AST 20  --   --   --   ALT 11*  --   --   --   ALKPHOS 51  --   --   --   BILITOT 0.9  --   --   --    Estimated Creatinine Clearance: 45.3 mL/min (by C-G formula based on Cr of 0.58).   Microbiology: No results found for this or any previous visit (from the past 720 hour(s)).  Medical History: Past Medical History  Diagnosis Date  . ALS (amyotrophic lateral sclerosis) (HCC)   . Depression   . GERD  (gastroesophageal reflux disease)   . Urinary retention   . Dysphagia, oropharyngeal 11/28/2013  . CN (constipation) 11/28/2013    Medications:  Scheduled:  . calcium gluconate  1 g Intravenous Once  . enoxaparin (LOVENOX) injection  40 mg Subcutaneous Q24H  . ertapenem  1 g Intravenous Q24H  . levETIRAcetam  500 mg Intravenous Q12H  . vancomycin  750 mg Intravenous Q18H    Assessment: Pharmacy consulted to manage electrolytes in this 55 year old female. 2/09 @ 18:00 :   K = 3.3                           Mag = 1.5                           Ca = 8.3 ,  Albumin = 3.7                           Corrected Ca = 8.54  Goal of Therapy:  K:  3.5 - 5.1 Ca:  8.9 - 10.3 Mag: 1.7 -  2.4   Plan:  Will order KCl 10 mEq IV X 4. Will order Calcium gluconate 1 gm IV X 1. Will order Magnesium Sulfate 2 gm IV X 1. Will recheck electrolytes on 2/10 with AM labs.   2/10 AM electrolytes WNL except for corrected Calcium 8.4. Will order 1 gram calcium gluconate IV x1 dose and recheck BMP and Mg tomorrow AM.   Jennifer [name] S 09/06/2015,6:32 AM

## 2015-09-06 NOTE — Evaluation (Signed)
Objective Swallowing Evaluation: MBS  Patient Details  Name: Vickye Astorino MRN: [id number] Date of Birth: 04-Mar-1961  Today's Date: 09/06/2015 Time: SLP Start Time (ACUTE ONLY): 1200-SLP Stop Time (ACUTE ONLY): 1300 SLP Time Calculation (min) (ACUTE ONLY): 60 min  Past Medical History:  Past Medical History  Diagnosis Date  . ALS (amyotrophic lateral sclerosis) (HCC)   . Depression   . GERD (gastroesophageal reflux disease)   . Urinary retention   . Dysphagia, oropharyngeal 11/28/2013  . CN (constipation) 11/28/2013   Past Surgical History:  Past Surgical History  Procedure Laterality Date  . Cesarean section    . Nasal sinus surgery      Assessment / Plan / Recommendation  Subjective: Patient behavior: (alertness, ability to follow instructions, etc.): Patient is non-verbal and appears minimally responsive to verbal or tactile stimulation Chief complaint: ALS   Objective:  Radiological Procedure: A videoflouroscopic evaluation of oral-preparatory, reflex initiation, and pharyngeal phases of the swallow was performed; as well as a screening of the upper esophageal phase.  I. POSTURE: In MBS chair, pharynx is parallel to the floor  II. VIEW: Lateral  III. COMPENSATORY STRATEGIES: Small, spoon boluses  IV. BOLUSES ADMINISTERED:   Thin Liquid: 2 spoon sips, 1 straw sip   Nectar-thick Liquid: 2 spoon sips, 1 straw sip   Honey-thick Liquid: 2 spoon sips   Puree: 2 1/2 teaspoon   Mechanical Soft: deferred  V. RESULTS OF EVALUATION: A. ORAL PREPARATORY PHASE: (The lips, tongue, and velum are observed for strength and coordination) Disorganized and slow posterior transfer       **Overall Severity Rating: Severe  B. SWALLOW INITIATION/REFLEX: (The reflex is normal if "triggered" by the time the bolus reached the base of the tongue): at valleculae with small boluses (across consistencies); triggers after aspiration with straw bolus thin  **Overall Severity Rating:  Severe  C. PHARYNGEAL PHASE: (Pharyngeal function is normal if the bolus shows rapid, smooth, and continuous transit through the pharynx and there is no pharyngeal residue after the swallow) Appears complete once triggered  **Overall Severity Rating: WNL  D. LARYNGEAL PENETRATION: (Material entering into the laryngeal inlet/vestibule but not aspirated) None with small boluses  E. ASPIRATION: Silent, before swallow, with thin liquid via straw  F. ESOPHAGEAL PHASE: (Screening of the upper esophagus)  Barium appears to collect in the esophagus to the mid-esophagus region indicating reduced esophageal motility.    ASSESSMENT: This 55 year old woman with ALS is presenting with moderate-severe oropharyngeal dysphagia characterized by severely impaired oral management (disorganized and prolonged posterior transfer of all boluses).  With small, teaspoon boluses, timing of the pharyngeal swallow is mildly delayed; triggering at the valleculae.  The patient's positioning (up in chair, pharynx parallel to the floor) allows for small, teaspoon boluses to collect at the valleculae and not fall into the airway before a swallow is initiated.  Once initiated, the pharyngeal swallow clears the pharynx.  With a straw sip, the liquids entered the airway before a swallow could be initiated.  There was no cough and the patient was not able to generate a volitional cough. Barium appears to collect in the esophagus to the mid-esophagus region indicating reduced esophageal motility.  This study supports a puree / nectar-thick diet given by spoon.  Recommend a conservative diet for now and that the patient be evaluated by her ALS team at [facility] for management of diet.  PLAN/RECOMMENDATIONS:    A. Diet: puree with nectar-thick liquid by spoon   B. Swallowing Precautions: Upright, supported  in chair (pharynx will be parallel to floor); small bites and sips by spoon; alternate bites and sips; strict aspiration precautions;  strict reflux precautions   C. Recommended consultation to [facility] ALS team   D. Therapy recommendations per ALS team recommendations   E. Results and recommendations were discussed with the inpatient SLP        Dollene Primrose, MS/CCC- SLP   Leandrew Koyanagi 09/06/2015, 2:11 PM

## 2015-09-06 NOTE — Consult Note (Signed)
[facility] Infectious Disease     Reason for Consult:UTI, fever    Referring Physician: Claria Dice Date of Admission:  09/04/2015   Active Problems:   UTI (lower urinary tract infection)   Pressure ulcer   Protein-calorie malnutrition, severe   HPI: Jennifer [name] is a 55 y.o. female with ALS, chronic foley and recurrent UTIs admitted 2/8 with confusion. Per her husband foley changed 2/ 5 since the balloon had burst. 3 days later developed confusion. She had similar admissions in past. In Dec had similar admission with ucx mixed. Treated with ertapenem and daptomycin then dced on fosfomycin .  Also had UTI dx in Oct and treated with Fosfomycin.  On this  admission had wbc 11.3, temp 101.6.  Her UA showed TNTC WBC.  Also of note had recent course of cipro from PMD for Klebsiella UTI    Past Medical History  Diagnosis Date  . ALS (amyotrophic lateral sclerosis) ([place])   . Depression   . GERD (gastroesophageal reflux disease)   . Urinary retention   . Dysphagia, oropharyngeal 11/28/2013  . CN (constipation) 11/28/2013   Past Surgical History  Procedure Laterality Date  . Cesarean section    . Nasal sinus surgery     Social History  Substance Use Topics  . Smoking status: Former Research scientist (life sciences)  . Smokeless tobacco: Never Used  . Alcohol Use: No   Family History  Problem Relation Age of Onset  . COPD Mother   . Thyroid disease Mother   . Diverticulosis Mother   . Heart attack Mother   . Gout Father   . Depression Sister   . Thyroid disease Sister   . Cancer Brother     kidney  . Heart attack Maternal Grandmother   . Heart attack Maternal Grandfather   . Heart attack Paternal Grandfather   . Hyperlipidemia Sister   . Yves Dill Parkinson White syndrome Sister 25  . Depression Brother   . Hypertension Brother     Allergies:  Allergies  Allergen Reactions  . Bee Venom Anaphylaxis  . Hydrocodone Other (See Comments)    Reaction:  Muscle stiffness   . Lipitor [Atorvastatin]  Other (See Comments)    Reaction:  Muscle cramps   . Shellfish Allergy Anaphylaxis  . Oxycodone Other (See Comments)    Reaction:  Muscle stiffness   . Prednisone Other (See Comments)    Reaction:  Psychosis   . Vicodin [Hydrocodone-Acetaminophen] Other (See Comments)    Reaction:  Muscle stiffness   . Oxycontin [Oxycodone Hcl] Other (See Comments)    Reaction:  Muscle stiffness   . Peanut-Containing Drug Products Diarrhea    Current antibiotics: Antibiotics Given (last 72 hours)    Date/Time Action Medication Dose Rate   09/05/15 0655 Given   ertapenem (INVANZ) 1 g in sodium chloride 0.9 % 50 mL IVPB 1 g 100 mL/hr   09/05/15 1413 Given   vancomycin (VANCOCIN) IVPB 750 mg/150 ml premix 750 mg 150 mL/hr   09/06/15 1018 Given   vancomycin (VANCOCIN) IVPB 750 mg/150 ml premix 750 mg 150 mL/hr   09/06/15 1032 Given   ertapenem (INVANZ) 1 g in sodium chloride 0.9 % 50 mL IVPB 1 g 100 mL/hr      MEDICATIONS: . calcium gluconate  1 g Intravenous Once  . enoxaparin (LOVENOX) injection  30 mg Subcutaneous Q24H  . ertapenem  1 g Intravenous Q24H  . levETIRAcetam  500 mg Intravenous Q12H  . vancomycin  750 mg Intravenous Q18H  Review of Systems - 11 systems reviewed and negative per HPI   OBJECTIVE: Temp:  [99.2 F (37.3 C)-101.1 F (38.4 C)] 99.2 F (37.3 C) (02/10 0912) Pulse Rate:  [93-101] 100 (02/10 0912) Resp:  [17-18] 17 (02/10 0912) BP: (103-146)/(60-90) 126/78 mmHg (02/10 0912) SpO2:  [97 %-100 %] 98 % (02/10 0912) Weight:  [35.68 kg (78 lb 10.6 oz)] 35.68 kg (78 lb 10.6 oz) (02/10 0435) GENERAL: curled on side. Thin EYES: Pupils equal, round, reactive to light and accommodation. No scleral icterus.  HEENT: Head atraumatic, normocephalic. No oropharyngeal erythema, moist oral dry NECK: Supple, no jugular venous distention. No thyroid enlargement, no tenderness.  LUNGS: Normal breath sounds bilaterally, no wheezing, rales, rhonchi. No use of accessory muscles  of respiration.  CARDIOVASCULAR: S1, S2 normal.  ABDOMEN: Soft, nontender, nondistended. Bowel sounds present. No organomegaly or mass.  EXTREMITIES: No pedal edema, cyanosis, or clubbing. + 2 pedal & radial pulses b/l. Diffuse muscle aches NEUROLOGIC: Slumped to the right side. Does not follow commands. PSYCHIATRIC: The patient is drowsy. SKIN: No obvious rash, lesion, or ulcer. Foley in place with concentrated urine  LABS: Results for orders placed or performed during the hospital encounter of 09/04/15 (from the past 48 hour(s))  CBC     Status: Abnormal   Collection Time: 09/04/15  9:01 PM  Result Value Ref Range   WBC 11.3 (H) 3.6 - 11.0 K/uL   RBC 3.59 (L) 3.80 - 5.20 MIL/uL   Hemoglobin 11.1 (L) 12.0 - 16.0 g/dL   HCT 32.6 (L) 35.0 - 47.0 %   MCV 90.7 80.0 - 100.0 fL   MCH 30.9 26.0 - 34.0 pg   MCHC 34.1 32.0 - 36.0 g/dL   RDW 14.3 11.5 - 14.5 %   Platelets 229 150 - 440 K/uL  Comprehensive metabolic panel     Status: Abnormal   Collection Time: 09/04/15  9:01 PM  Result Value Ref Range   Sodium 136 135 - 145 mmol/L   Potassium 3.1 (L) 3.5 - 5.1 mmol/L   Chloride 100 (L) 101 - 111 mmol/L   CO2 26 22 - 32 mmol/L   Glucose, Bld 82 65 - 99 mg/dL   BUN 15 6 - 20 mg/dL   Creatinine, Ser 0.69 0.44 - 1.00 mg/dL   Calcium 9.0 8.9 - 10.3 mg/dL   Total Protein 6.7 6.5 - 8.1 g/dL   Albumin 3.7 3.5 - 5.0 g/dL   AST 20 15 - 41 U/L   ALT 11 (L) 14 - 54 U/L   Alkaline Phosphatase 51 38 - 126 U/L   Total Bilirubin 0.9 0.3 - 1.2 mg/dL   GFR calc non Af Amer >60 >60 mL/min   GFR calc Af Amer >60 >60 mL/min    Comment: (NOTE) The eGFR has been calculated using the CKD EPI equation. This calculation has not been validated in all clinical situations. eGFR's persistently <60 mL/min signify possible Chronic Kidney Disease.    Anion gap 10 5 - 15  Urinalysis complete, with microscopic ([facility] only)     Status: Abnormal   Collection Time: 09/04/15  9:01 PM  Result Value Ref Range    Color, Urine YELLOW (A) YELLOW   APPearance CLOUDY (A) CLEAR   Glucose, UA NEGATIVE NEGATIVE mg/dL   Bilirubin Urine NEGATIVE NEGATIVE   Ketones, ur 1+ (A) NEGATIVE mg/dL   Specific Gravity, Urine 1.026 1.005 - 1.030   Hgb urine dipstick 1+ (A) NEGATIVE   pH 5.0 5.0 - 8.0  Protein, ur 30 (A) NEGATIVE mg/dL   Nitrite NEGATIVE NEGATIVE   Leukocytes, UA 3+ (A) NEGATIVE   RBC / HPF 6-30 0 - 5 RBC/hpf   WBC, UA TOO NUMEROUS TO COUNT 0 - 5 WBC/hpf   Bacteria, UA MANY (A) NONE SEEN   Squamous Epithelial / LPF NONE SEEN NONE SEEN   Mucous PRESENT   Ethanol     Status: None   Collection Time: 09/04/15  9:01 PM  Result Value Ref Range   Alcohol, Ethyl (B) <5 <5 mg/dL    Comment:        LOWEST DETECTABLE LIMIT FOR SERUM ALCOHOL IS 5 mg/dL FOR MEDICAL PURPOSES ONLY   Troponin I     Status: None   Collection Time: 09/04/15  9:01 PM  Result Value Ref Range   Troponin I 0.03 <0.031 ng/mL    Comment:        NO INDICATION OF MYOCARDIAL INJURY.   Lactic acid, plasma     Status: Abnormal   Collection Time: 09/04/15  9:01 PM  Result Value Ref Range   Lactic Acid, Venous 2.1 (HH) 0.5 - 2.0 mmol/L    Comment: CRITICAL RESULT CALLED TO, READ BACK BY AND VERIFIED WITH  LANA BAHOUR AT 2220 09/04/15 SDR   Urine culture     Status: None   Collection Time: 09/04/15  9:01 PM  Result Value Ref Range   Specimen Description URINE, RANDOM    Special Requests NONE    Culture MULTIPLE SPECIES PRESENT, SUGGEST RECOLLECTION    Report Status 09/06/2015 FINAL   Blood Culture (routine x 2)     Status: None (Preliminary result)   Collection Time: 09/04/15  9:41 PM  Result Value Ref Range   Specimen Description BLOOD RIGHT ARM    Special Requests BOTTLES DRAWN AEROBIC AND ANAEROBIC 5ML    Culture NO GROWTH 2 DAYS    Report Status PENDING   Blood Culture (routine x 2)     Status: None (Preliminary result)   Collection Time: 09/04/15  9:41 PM  Result Value Ref Range   Specimen Description BLOOD LEFT ARM     Special Requests BOTTLES DRAWN AEROBIC AND ANAEROBIC 5ML    Culture NO GROWTH 2 DAYS    Report Status PENDING   Lactic acid, plasma     Status: None   Collection Time: 09/05/15  5:19 AM  Result Value Ref Range   Lactic Acid, Venous 0.8 0.5 - 2.0 mmol/L  Basic metabolic panel     Status: Abnormal   Collection Time: 09/05/15  5:19 AM  Result Value Ref Range   Sodium 138 135 - 145 mmol/L   Potassium 2.5 (LL) 3.5 - 5.1 mmol/L    Comment: CRITICAL RESULT CALLED TO, READ BACK BY AND VERIFIED WITH TERESA COBLE ON 09/05/15 0602 SRC    Chloride 105 101 - 111 mmol/L   CO2 25 22 - 32 mmol/L   Glucose, Bld 74 65 - 99 mg/dL   BUN 12 6 - 20 mg/dL   Creatinine, Ser 0.48 0.44 - 1.00 mg/dL   Calcium 8.1 (L) 8.9 - 10.3 mg/dL   GFR calc non Af Amer >60 >60 mL/min   GFR calc Af Amer >60 >60 mL/min    Comment: (NOTE) The eGFR has been calculated using the CKD EPI equation. This calculation has not been validated in all clinical situations. eGFR's persistently <60 mL/min signify possible Chronic Kidney Disease.    Anion gap 8 5 - 15  CBC  Status: Abnormal   Collection Time: 09/05/15  5:19 AM  Result Value Ref Range   WBC 11.7 (H) 3.6 - 11.0 K/uL   RBC 3.26 (L) 3.80 - 5.20 MIL/uL   Hemoglobin 10.1 (L) 12.0 - 16.0 g/dL   HCT 29.6 (L) 35.0 - 47.0 %   MCV 91.0 80.0 - 100.0 fL   MCH 31.1 26.0 - 34.0 pg   MCHC 34.2 32.0 - 36.0 g/dL   RDW 14.4 11.5 - 14.5 %   Platelets 195 150 - 440 K/uL  Basic metabolic panel     Status: Abnormal   Collection Time: 09/05/15  5:51 PM  Result Value Ref Range   Sodium 135 135 - 145 mmol/L   Potassium 3.3 (L) 3.5 - 5.1 mmol/L   Chloride 105 101 - 111 mmol/L   CO2 21 (L) 22 - 32 mmol/L   Glucose, Bld 71 65 - 99 mg/dL   BUN 8 6 - 20 mg/dL   Creatinine, Ser 0.63 0.44 - 1.00 mg/dL   Calcium 8.3 (L) 8.9 - 10.3 mg/dL   GFR calc non Af Amer >60 >60 mL/min   GFR calc Af Amer >60 >60 mL/min    Comment: (NOTE) The eGFR has been calculated using the CKD EPI  equation. This calculation has not been validated in all clinical situations. eGFR's persistently <60 mL/min signify possible Chronic Kidney Disease.    Anion gap 9 5 - 15  Magnesium     Status: Abnormal   Collection Time: 09/05/15  5:51 PM  Result Value Ref Range   Magnesium 1.5 (L) 1.7 - 2.4 mg/dL  Basic metabolic panel     Status: Abnormal   Collection Time: 09/06/15  5:13 AM  Result Value Ref Range   Sodium 136 135 - 145 mmol/L   Potassium 3.6 3.5 - 5.1 mmol/L   Chloride 107 101 - 111 mmol/L   CO2 19 (L) 22 - 32 mmol/L   Glucose, Bld 75 65 - 99 mg/dL   BUN 10 6 - 20 mg/dL   Creatinine, Ser 0.58 0.44 - 1.00 mg/dL   Calcium 8.2 (L) 8.9 - 10.3 mg/dL   GFR calc non Af Amer >60 >60 mL/min   GFR calc Af Amer >60 >60 mL/min    Comment: (NOTE) The eGFR has been calculated using the CKD EPI equation. This calculation has not been validated in all clinical situations. eGFR's persistently <60 mL/min signify possible Chronic Kidney Disease.    Anion gap 10 5 - 15  CBC     Status: Abnormal   Collection Time: 09/06/15  5:13 AM  Result Value Ref Range   WBC 15.2 (H) 3.6 - 11.0 K/uL   RBC 3.32 (L) 3.80 - 5.20 MIL/uL   Hemoglobin 10.4 (L) 12.0 - 16.0 g/dL   HCT 30.0 (L) 35.0 - 47.0 %   MCV 90.4 80.0 - 100.0 fL   MCH 31.4 26.0 - 34.0 pg   MCHC 34.7 32.0 - 36.0 g/dL   RDW 14.1 11.5 - 14.5 %   Platelets 238 150 - 440 K/uL  Influenza panel by PCR (type A & B, H1N1)     Status: None   Collection Time: 09/06/15  7:15 AM  Result Value Ref Range   Influenza A By PCR NEGATIVE NEGATIVE   Influenza B By PCR NEGATIVE NEGATIVE   H1N1 flu by pcr NOT DETECTED NOT DETECTED    Comment:        The Xpert Flu assay (FDA approved for nasal  aspirates or washes and nasopharyngeal swab specimens), is intended as an aid in the diagnosis of influenza and should not be used as a sole basis for treatment.    No components found for: ESR, C REACTIVE PROTEIN MICRO: Recent Results (from the past 720  hour(s))  Urine culture     Status: None   Collection Time: 09/04/15  9:01 PM  Result Value Ref Range Status   Specimen Description URINE, RANDOM  Final   Special Requests NONE  Final   Culture MULTIPLE SPECIES PRESENT, SUGGEST RECOLLECTION  Final   Report Status 09/06/2015 FINAL  Final  Blood Culture (routine x 2)     Status: None (Preliminary result)   Collection Time: 09/04/15  9:41 PM  Result Value Ref Range Status   Specimen Description BLOOD RIGHT ARM  Final   Special Requests BOTTLES DRAWN AEROBIC AND ANAEROBIC 5ML  Final   Culture NO GROWTH 2 DAYS  Final   Report Status PENDING  Incomplete  Blood Culture (routine x 2)     Status: None (Preliminary result)   Collection Time: 09/04/15  9:41 PM  Result Value Ref Range Status   Specimen Description BLOOD LEFT ARM  Final   Special Requests BOTTLES DRAWN AEROBIC AND ANAEROBIC 5ML  Final   Culture NO GROWTH 2 DAYS  Final   Report Status PENDING  Incomplete   12/230 cx Culture KLEBSIELLA PNEUMONIAE   Colony Count >=100,000 COLONIES/ML   Organism ID, Bacteria KLEBSIELLA PNEUMONIAE   Resulting Agency [facility]    Culture & Susceptibility      KLEBSIELLA PNEUMONIAE     Antibiotic Sensitivity Microscan Status    AMOX/CLAVULANIC Sensitive 4 Final    AMPICILLIN Resistant >=32 Final    AMPICILLIN/SULBACTAM Intermediate 16 Final    CEFAZOLIN Not Reportable <=4 Final    CEFEPIME Sensitive <=1 Final    CEFTAZIDIME Sensitive <=1 Final    CEFTRIAXONE Sensitive <=1 Final    CIPROFLOXACIN Sensitive <=0.25 Final    GENTAMICIN Sensitive <=1 Final    IMIPENEM Sensitive <=0.25 Final    LEVOFLOXACIN Sensitive 1 Final    NITROFURANTOIN Intermediate 64 Final    PIP/TAZO Sensitive 16 Final    TOBRAMYCIN Sensitive <=1 Final    TRIMETH/SULFA Sensitive <=20            IMAGING: Dg Chest Port 1 View  09/04/2015  CLINICAL DATA:  Acute onset of fever and altered mental status. Initial encounter. EXAM:  PORTABLE CHEST 1 VIEW COMPARISON:  Chest radiograph performed 06/26/2015 FINDINGS: The lungs are well-aerated. Mild peribronchial thickening is noted. There is no evidence of focal opacification, pleural effusion or pneumothorax. The cardiomediastinal silhouette is within normal limits. No acute osseous abnormalities are seen. IMPRESSION: Mild peribronchial thickening noted.  Lungs otherwise grossly clear. Electronically Signed   By: Garald Balding M.D.   On: 09/04/2015 22:15   CT 12/2014 CLINICAL DATA: Urinary retention. Urinary tract infection.  EXAM: CT ABDOMEN AND PELVIS WITHOUT CONTRAST  TECHNIQUE: Multidetector CT imaging of the abdomen and pelvis was performed following the standard protocol without IV contrast.  COMPARISON: None.  FINDINGS: Musculoskeletal: No aggressive osseous lesions.  Lung Bases: Atelectasis.  Liver: Unenhanced CT was performed per clinician order. Lack of IV contrast limits sensitivity and specificity, especially for evaluation of abdominal/pelvic solid viscera. Grossly normal.  Spleen: Normal.  Gallbladder: Normal.  Common bile duct: Normal.  Pancreas: Normal.  Adrenal glands: Normal bilaterally.  Kidneys: Partially duplicated LEFT renal collecting system with prominent column a per 10. Tiny  E LEFT upper pole area increased density probably representing milk of calcium in a cyst. No ureteral calculi. No hydronephrosis or perinephric inflammatory stranding.  Stomach: Distended with oral contrast.  Small bowel: Normal.  Colon: Large stool burden. Normal appendix. No obstruction or inflammatory changes of colon.  Pelvic Genitourinary: Distended urinary bladder. No inflammatory changes, debris, gas or mural thickening. Uterus and adnexa appear within normal limits.  Peritoneum: No free air or free fluid.  Vascular/lymphatic: Atherosclerosis without an acute vascular abnormality allowing for noncontrast  technique.  Body Wall: RIGHT lower quadrant anterior abdominal wall subcutaneous injection site. Nonspecific stranding lateral to the LEFT hip in the subcutaneous fat.  IMPRESSION: 1. No acute abnormality. 2. Atherosclerosis.  Assessment:   Jennifer [name] is a 55 y.o. 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 Prior cxs with VRE and Klebsiella - currently cx mixed.  Foley changed this admission - she was due to have suprapubic cath placed btu declined. Has not been on suppressive abx, gets foley changed q 2 weeks at home.  She had CT scan in June to look for other reasons for recurrent UTI   Recommendations Cont ertapenem. Dc vanco  Repeat ua and ucx to see if can isolate organism If no organism isolated and she improves would dc on fosfomycin 3 gm q 2 days for 3 doses then start suppressive macrobid 100 qd lifelong  Thank you very much for allowing me to participate in the care of this patient. Please call with questions.   Cheral Marker. Ola Spurr, MD

## 2015-09-06 NOTE — Progress Notes (Signed)
Pt continues to run a fever.  I did not a large amount of stool in the colon when i inserted the tylenol suppository earlier.  Motrin ordered

## 2015-09-06 NOTE — Telephone Encounter (Signed)
If she restarted it and it is helping with her depression, then ok to continue.

## 2015-09-06 NOTE — Progress Notes (Signed)
Husband concerned about pt not getting home meds.  He really wants her to restart her valium.  He said the morphine works but for only about 15 mins.  Valium ordered. MD will review home meds

## 2015-09-06 NOTE — Progress Notes (Signed)
ID addendim Spiked fever to 102. Will need to change to dapto and meropenem given risk of MDRO.  Repeat ucx pending.

## 2015-09-06 NOTE — Telephone Encounter (Signed)
Please call pt's husband to inquire about this refill request.  Thanks

## 2015-09-06 NOTE — Progress Notes (Signed)
Pharmacy Antibiotic Note  Jennifer [name] is a 55 y.o. female admitted on 09/04/2015 with UTI/sepsis.  Pharmacy has been consulted for daptomycin and meropenem dosing per ID for continued fevers after broad-spectrum therapy and a history of VRE UTIs.  Plan: Initiate meropenem 1g IV q12hrs based on renal function <50 ml/min.   Initiate daptomycin  IV q 24hrs. Will continue to follow for signs of infection resolution.  UA: LE(+) NO2(-) WBC TNTC Blood and urine cx pending CXR: Clear  Height:  (149.9 cm) Weight: 78 lb 10.6 oz (35.68 kg) IBW/kg (Calculated) : 43.2  Temp (24hrs), Avg:100.9 F (38.3 C), Min:99.2 F (37.3 C), Max:102.6 F (39.2 C)   Recent Labs Lab 09/04/15 2101 09/05/15 0519 09/05/15 1751 09/06/15 0513  WBC 11.3* 11.7*  --  15.2*  CREATININE 0.69 0.48 0.63 0.58  LATICACIDVEN 2.1* 0.8  --   --     Estimated Creatinine Clearance: 45.3 mL/min (by C-G formula based on Cr of 0.58).    Allergies  Allergen Reactions  . Bee Venom Anaphylaxis  . Hydrocodone Other (See Comments)    Reaction:  Muscle stiffness   . Lipitor [Atorvastatin] Other (See Comments)    Reaction:  Muscle cramps   . Shellfish Allergy Anaphylaxis  . Oxycodone Other (See Comments)    Reaction:  Muscle stiffness   . Prednisone Other (See Comments)    Reaction:  Psychosis   . Vicodin [Hydrocodone-Acetaminophen] Other (See Comments)    Reaction:  Muscle stiffness   . Oxycontin [Oxycodone Hcl] Other (See Comments)    Reaction:  Muscle stiffness   . Peanut-Containing Drug Products Diarrhea   Anti-infectives    Start     Dose/Rate Route Frequency Ordered Stop   09/06/15 1000  ertapenem (INVANZ) 1 g in sodium chloride 0.9 % 50 mL IVPB  Status:  Discontinued     1 g 100 mL/hr over 30 Minutes Intravenous Every 24 hours 09/04/15 2358 09/06/15 1714   09/05/15 1400  vancomycin (VANCOCIN) IVPB 750 mg/150 ml premix  Status:  Discontinued     750 mg 150 mL/hr over 60 Minutes Intravenous Every  18 hours 09/05/15 0249 09/06/15 1458   09/05/15 0300  ertapenem (INVANZ) 1 g in sodium chloride 0.9 % 50 mL IVPB     1 g 100 mL/hr over 30 Minutes Intravenous  Once 09/05/15 0253 09/05/15 0725   09/04/15 2200  piperacillin-tazobactam (ZOSYN) IVPB 3.375 g     3.375 g 100 mL/hr over 30 Minutes Intravenous  Once 09/04/15 2157 09/04/15 2243   09/04/15 2200  vancomycin (VANCOCIN) IVPB 1000 mg/200 mL premix     1,000 mg 200 mL/hr over 60 Minutes Intravenous  Once 09/04/15 2157 09/04/15 2312      Results for orders placed or performed during the hospital encounter of 09/04/15  Urine culture     Status: None   Collection Time: 09/04/15  9:01 PM  Result Value Ref Range Status   Specimen Description URINE, RANDOM  Final   Special Requests NONE  Final   Culture MULTIPLE SPECIES PRESENT, SUGGEST RECOLLECTION  Final   Report Status 09/06/2015 FINAL  Final  Blood Culture (routine x 2)     Status: None (Preliminary result)   Collection Time: 09/04/15  9:41 PM  Result Value Ref Range Status   Specimen Description BLOOD RIGHT ARM  Final   Special Requests BOTTLES DRAWN AEROBIC AND ANAEROBIC  Final   Culture NO GROWTH 2 DAYS  Final   Report Status PENDING  Incomplete  Blood Culture (routine x 2)     Status: None (Preliminary result)   Collection Time: 09/04/15  9:41 PM  Result Value Ref Range Status   Specimen Description BLOOD LEFT ARM  Final   Special Requests BOTTLES DRAWN AEROBIC AND ANAEROBIC  Final   Culture NO GROWTH 2 DAYS  Final   Report Status PENDING  Incomplete     Thank you for allowing pharmacy to be a part of this patient's care.  Cy Blamer 09/06/2015 6:33 PM

## 2015-09-07 LAB — BASIC METABOLIC PANEL
Anion gap: 6 (ref 5–15)
BUN: 13 mg/dL (ref 6–20)
CALCIUM: 7.8 mg/dL — AB (ref 8.9–10.3)
CHLORIDE: 114 mmol/L — AB (ref 101–111)
CO2: 19 mmol/L — AB (ref 22–32)
CREATININE: 0.55 mg/dL (ref 0.44–1.00)
GFR calc non Af Amer: >60 mL/min (ref >60)
Glucose, Bld: 97 mg/dL (ref 65–99)
Potassium: 3.6 mmol/L (ref 3.5–5.1)
SODIUM: 139 mmol/L (ref 135–145)

## 2015-09-07 LAB — MAGNESIUM: MAGNESIUM: 1.7 mg/dL (ref 1.7–2.4)

## 2015-09-07 MED ORDER — DIAZEPAM 5 MG PO TABS
10.0000 mg | ORAL_TABLET | Freq: Four times a day (QID) | ORAL | Status: DC | PRN
  Administered 2015-09-08 – 2015-09-10 (×6): 10 mg via ORAL
  Filled 2015-09-07 (×7): qty 2

## 2015-09-07 MED ORDER — ATROPINE SULFATE 1 % OP SOLN
2.0000 [drp] | Freq: Four times a day (QID) | OPHTHALMIC | Status: DC | PRN
  Filled 2015-09-07: qty 2

## 2015-09-07 MED ORDER — SODIUM CHLORIDE 0.9 % IV BOLUS (SEPSIS)
250.0000 mL | Freq: Once | INTRAVENOUS | Status: AC
  Administered 2015-09-07: 250 mL via INTRAVENOUS

## 2015-09-07 MED ORDER — BUPROPION HCL ER (SR) 150 MG PO TB12
300.0000 mg | ORAL_TABLET | Freq: Two times a day (BID) | ORAL | Status: DC
  Filled 2015-09-07 (×2): qty 2

## 2015-09-07 MED ORDER — TIZANIDINE HCL 4 MG PO TABS
2.0000 mg | ORAL_TABLET | Freq: Every day | ORAL | Status: DC
  Administered 2015-09-07 – 2015-09-16 (×10): 2 mg via ORAL
  Filled 2015-09-07 (×10): qty 1

## 2015-09-07 MED ORDER — SODIUM CHLORIDE 0.9 % IV SOLN
1.0000 g | Freq: Once | INTRAVENOUS | Status: AC
  Administered 2015-09-07: 1 g via INTRAVENOUS
  Filled 2015-09-07: qty 10

## 2015-09-07 NOTE — Progress Notes (Signed)
Pts BP 96/50, Temp 100.5. MD [facility] notified. Orders received for NS 250 ml fluid bolus, and Tylenol suppository. Will continue to monitor.

## 2015-09-07 NOTE — Progress Notes (Addendum)
MEDICATION RELATED CONSULT NOTE - INITIAL   Pharmacy Consult for Electrolytes Indication: Calcium , Potassium   Allergies  Allergen Reactions  . Bee Venom Anaphylaxis  . Hydrocodone Other (See Comments)    Reaction:  Muscle stiffness   . Lipitor [Atorvastatin] Other (See Comments)    Reaction:  Muscle cramps   . Shellfish Allergy Anaphylaxis  . Oxycodone Other (See Comments)    Reaction:  Muscle stiffness   . Prednisone Other (See Comments)    Reaction:  Psychosis   . Vicodin [Hydrocodone-Acetaminophen] Other (See Comments)    Reaction:  Muscle stiffness   . Oxycontin [Oxycodone Hcl] Other (See Comments)    Reaction:  Muscle stiffness   . Peanut-Containing Drug Products Diarrhea    Patient Measurements: Height:  (149.9 cm) Weight: 83 lb 11.2 oz (37.966 kg) IBW/kg (Calculated) : 43.2 Adjusted Body Weight:   Vital Signs: Temp: 98.1 F (36.7 C) (02/11 0627) Temp Source: Oral (02/11 0627) BP: 95/67 mmHg (02/11 0628) Pulse Rate: 92 (02/11 0628) Intake/Output from previous day: 02/10 0701 - 02/11 0700 In: 3587.5 [I.V.:3217.5; IV Piggyback:210] Out: 300 [Urine:300] Intake/Output from this shift: Total I/O In: 3427.5 [I.V.:3217.5; IV Piggyback:210] Out: 300 [Urine:300]  Labs:  Recent Labs  09/04/15 2101 09/05/15 0519 09/05/15 1751 09/06/15 0513 09/07/15 0444  WBC 11.3* 11.7*  --  15.2*  --   HGB 11.1* 10.1*  --  10.4*  --   HCT 32.6* 29.6*  --  30.0*  --   PLT 229 195  --  238  --   CREATININE 0.69 0.48 0.63 0.58 0.55  MG  --   --  1.5*  --  1.7  ALBUMIN 3.7  --   --   --   --   PROT 6.7  --   --   --   --   AST 20  --   --   --   --   ALT 11*  --   --   --   --   ALKPHOS 51  --   --   --   --   BILITOT 0.9  --   --   --   --    Estimated Creatinine Clearance: 48.2 mL/min (by C-G formula based on Cr of 0.55).   Microbiology: Recent Results (from the past 720 hour(s))  Urine culture     Status: None   Collection Time: 09/04/15  9:01 PM  Result  Value Ref Range Status   Specimen Description URINE, RANDOM  Final   Special Requests NONE  Final   Culture MULTIPLE SPECIES PRESENT, SUGGEST RECOLLECTION  Final   Report Status 09/06/2015 FINAL  Final  Blood Culture (routine x 2)     Status: None (Preliminary result)   Collection Time: 09/04/15  9:41 PM  Result Value Ref Range Status   Specimen Description BLOOD RIGHT ARM  Final   Special Requests BOTTLES DRAWN AEROBIC AND ANAEROBIC  Final   Culture NO GROWTH 3 DAYS  Final   Report Status PENDING  Incomplete  Blood Culture (routine x 2)     Status: None (Preliminary result)   Collection Time: 09/04/15  9:41 PM  Result Value Ref Range Status   Specimen Description BLOOD LEFT ARM  Final   Special Requests BOTTLES DRAWN AEROBIC AND ANAEROBIC  Final   Culture NO GROWTH 3 DAYS  Final   Report Status PENDING  Incomplete    Medical History: Past Medical History  Diagnosis Date  .  ALS (amyotrophic lateral sclerosis) (HCC)   . Depression   . GERD (gastroesophageal reflux disease)   . Urinary retention   . Dysphagia, oropharyngeal 11/28/2013  . CN (constipation) 11/28/2013    Medications:  Scheduled:  . calcium gluconate  1 g Intravenous Once  . DAPTOmycin (CUBICIN)  IV  200 mg Intravenous Q24H  . enoxaparin (LOVENOX) injection  30 mg Subcutaneous Q24H  . levETIRAcetam  500 mg Intravenous Q12H  . meropenem (MERREM) IV  1 g Intravenous Q12H    Assessment: Pharmacy consulted to manage electrolytes in this 55 year old female. 2/09 @ 18:00 :   K = 3.3                           Mag = 1.5                           Ca = 8.3 ,  Albumin = 3.7                           Corrected Ca = 8.54  Goal of Therapy:  K:  3.5 - 5.1 Ca:  8.9 - 10.3 Mag: 1.7 - 2.4   Plan:  Will order KCl 10 mEq IV X 4. Will order Calcium gluconate 1 gm IV X 1. Will order Magnesium Sulfate 2 gm IV X 1. Will recheck electrolytes on 2/10 with AM labs.   2/10 AM electrolytes WNL except for corrected  Calcium 8.4. Will order 1 gram calcium gluconate IV x1 dose and recheck BMP and Mg tomorrow AM.  2/11 AM electrolytes WNL except calcium still low. Calcium ordered yesterday apparently was not given. Will reorder 1 gram calcium gluconate for this AM and recheck BMP in AM.   Kordell Jafri S 09/07/2015,6:43 AM

## 2015-09-07 NOTE — Progress Notes (Signed)
[facility] - [place] at [facility]   PATIENT NAME: Jennifer [name]    MR#:  [id number]  DATE OF BIRTH:  1961-04-28  SUBJECTIVE:  CHIEF COMPLAINT:   Chief Complaint  Patient presents with  . Altered Mental Status   - Husband at bedside. Patient remains alert enough to take medications. Responding occasionally and following simple commands. -She had a fever as high as 102F last evening. Antibiotics changed to daptomycin and meropenem -Afebrile this morning. Husband wants to take her home tomorrow  REVIEW OF SYSTEMS:  Review of Systems  Unable to perform ROS: mental acuity    DRUG ALLERGIES:   Allergies  Allergen Reactions  . Bee Venom Anaphylaxis  . Hydrocodone Other (See Comments)    Reaction:  Muscle stiffness   . Lipitor [Atorvastatin] Other (See Comments)    Reaction:  Muscle cramps   . Shellfish Allergy Anaphylaxis  . Oxycodone Other (See Comments)    Reaction:  Muscle stiffness   . Prednisone Other (See Comments)    Reaction:  Psychosis   . Vicodin [Hydrocodone-Acetaminophen] Other (See Comments)    Reaction:  Muscle stiffness   . Oxycontin [Oxycodone Hcl] Other (See Comments)    Reaction:  Muscle stiffness   . Peanut-Containing Drug Products Diarrhea    VITALS:  Blood pressure 102/69, pulse 90, temperature 98 F (36.7 C), temperature source Axillary, resp. rate 18, height  (1.499 m), weight 37.966 kg (83 lb 11.2 oz), SpO2 100 %.  PHYSICAL EXAMINATION:  Physical Exam  GENERAL:  55 y.o.-year-old patient lying in the bed with no acute distress. Patient has ALS. EYES: Pupils equal, round, reactive to light and accommodation. No scleral icterus. Extraocular muscles intact.  HEENT: Head atraumatic, normocephalic. Oropharynx and nasopharynx clear.  NECK:  Supple, no jugular venous distention. No thyroid enlargement, no tenderness.  LUNGS: Normal breath sounds bilaterally, no wheezing, rales,rhonchi or crepitation. No use of  accessory muscles of respiration. Decreased bibasilar breath sounds CARDIOVASCULAR: S1, S2 normal. No murmurs, rubs, or gallops.  ABDOMEN: Soft, nontender, nondistended. Bowel sounds present. No organomegaly or mass.  EXTREMITIES: No pedal edema, cyanosis, or clubbing.  NEUROLOGIC: Lying in bed, contracted appearing, mumbling- speech. following commands PSYCHIATRIC: The patient is opening eyes to her name and following some simple commands. Appears weak  SKIN: No obvious rash, lesion, or ulcer.    LABORATORY PANEL:   CBC  Recent Labs Lab 09/06/15 0513  WBC 15.2*  HGB 10.4*  HCT 30.0*  PLT 238   ------------------------------------------------------------------------------------------------------------------  Chemistries   Recent Labs Lab 09/04/15 2101  09/07/15 0444  NA 136  < > 139  K 3.1*  < > 3.6  CL 100*  < > 114*  CO2 26  < > 19*  GLUCOSE 82  < > 97  BUN 15  < > 13  CREATININE 0.69  < > 0.55  CALCIUM 9.0  < > 7.8*  MG  --   < > 1.7  AST 20  --   --   ALT 11*  --   --   ALKPHOS 51  --   --   BILITOT 0.9  --   --   < > = values in this interval not displayed. ------------------------------------------------------------------------------------------------------------------  Cardiac Enzymes  Recent Labs Lab 09/04/15 2101  TROPONINI 0.03   ------------------------------------------------------------------------------------------------------------------  RADIOLOGY:  No results found.  EKG:   Orders placed or performed during the hospital encounter of 09/04/15  . ED EKG 12-Lead  . ED EKG 12-Lead  ASSESSMENT AND PLAN:   55 year old female with known history of ALS with chronic Foley catheter and prior history of multiple UTIs, depression, GERD was brought in secondary to altered mental status.  #1 Metabolic encephalopathy-secondary to sepsis with UTI -CT of the head nontender admission.  -Improving mental status.  #2 Sepsis-secondary to acute  cystitis likely, much or if there's any other source. -ID consult appreciated. Due to spiking fevers last evening, and risk of multidrug resistant organisms in urine, antibiotics have been changed to daptomycin and meropenem. Has remained afebrile since then. Repeat urine cultures are pending. -Blood cultures from admission are negative and also urine culture did not grow any organism. Influenza test is negative.  #3 hypokalemia-known history of chronic hypokalemia and takes potassium supplements at home. Replaced as needed now  #4 seizure disorder-continue Keppra  this time.  #5 ALS-following with neurologist at [facility]. -Bed bound at baseline, but still able to sit with assistance and feed herself and alert and interactive. -Continue to monitor as patient is not close to her baseline.  #6 Dysphagia- appreciate speech therapy consult. Started on dysphagia diet  #7 Severe Malnutrition- dietary consulted Dietary supplements recommended  #7 DVT prophylaxis-on Lovenox   All the records are reviewed and case discussed with Care Management/Social Workerr. Management plans discussed with the patient, family and they are in agreement.  CODE STATUS: Full Code  TOTAL TIME TAKING CARE OF THIS PATIENT: 37 minutes.   POSSIBLE D/C IN 2-3 DAYS, DEPENDING ON CLINICAL CONDITION.   Enid Baas M.D on 09/07/2015 at 12:06 PM  Between 7am to 6pm - Pager - [phone]  After 6pm go to [email or web address] - password [email or web address] [facility]  [facility]  Office  [phone]  CC: Primary care physician; Ruthe Mannan, MD

## 2015-09-08 LAB — CBC
HCT: 27 % — ABNORMAL LOW (ref 35.0–47.0)
Hemoglobin: 9 g/dL — ABNORMAL LOW (ref 12.0–16.0)
MCH: 31.1 pg (ref 26.0–34.0)
MCHC: 33.3 g/dL (ref 32.0–36.0)
MCV: 93.3 fL (ref 80.0–100.0)
PLATELETS: 266 10*3/uL (ref 150–440)
RBC: 2.89 MIL/uL — AB (ref 3.80–5.20)
RDW: 14.7 % — ABNORMAL HIGH (ref 11.5–14.5)
WBC: 14.8 10*3/uL — AB (ref 3.6–11.0)

## 2015-09-08 LAB — BASIC METABOLIC PANEL
ANION GAP: 5 (ref 5–15)
BUN: 15 mg/dL (ref 6–20)
CALCIUM: 7.8 mg/dL — AB (ref 8.9–10.3)
CO2: 19 mmol/L — ABNORMAL LOW (ref 22–32)
Chloride: 118 mmol/L — ABNORMAL HIGH (ref 101–111)
Creatinine, Ser: 0.48 mg/dL (ref 0.44–1.00)
GLUCOSE: 136 mg/dL — AB (ref 65–99)
Potassium: 4 mmol/L (ref 3.5–5.1)
SODIUM: 142 mmol/L (ref 135–145)

## 2015-09-08 LAB — MAGNESIUM: MAGNESIUM: 1.8 mg/dL (ref 1.7–2.4)

## 2015-09-08 MED ORDER — MAGNESIUM SULFATE 2 GM/50ML IV SOLN
2.0000 g | Freq: Once | INTRAVENOUS | Status: AC
  Administered 2015-09-08: 2 g via INTRAVENOUS
  Filled 2015-09-08: qty 50

## 2015-09-08 MED ORDER — LACTULOSE 10 GM/15ML PO SOLN: 10.0000 g | Freq: Three times a day (TID) | ORAL | Status: DC | PRN

## 2015-09-08 MED ORDER — BUPROPION HCL ER (SR) 100 MG PO TB12
300.0000 mg | ORAL_TABLET | Freq: Two times a day (BID) | ORAL | Status: DC
  Administered 2015-09-08 – 2015-09-10 (×6): 300 mg via ORAL
  Filled 2015-09-08 (×8): qty 2

## 2015-09-08 NOTE — Progress Notes (Signed)
Pts manual BP 88/64. MD [facility] notified. No new orders received other than to notify if systolic BP is < 85. Will continue to monitor.

## 2015-09-08 NOTE — Progress Notes (Signed)
[facility] - [place] at [facility]   PATIENT NAME: Jennifer [name]    MR#:  [id number]  DATE OF BIRTH:  07-25-1961  SUBJECTIVE:  CHIEF COMPLAINT:   Chief Complaint  Patient presents with  . Altered Mental Status   - Patient more alert and close to her baseline. Occasionally appears emotional, speech is not understandable. Not sure if she is in pain. -Caregiver at bedside and states patient is almost back to her baseline. -Her last fever was 100.11F yesterday  REVIEW OF SYSTEMS:  Review of Systems  Unable to perform ROS: mental acuity    DRUG ALLERGIES:   Allergies  Allergen Reactions  . Bee Venom Anaphylaxis  . Hydrocodone Other (See Comments)    Reaction:  Muscle stiffness   . Lipitor [Atorvastatin] Other (See Comments)    Reaction:  Muscle cramps   . Shellfish Allergy Anaphylaxis  . Oxycodone Other (See Comments)    Reaction:  Muscle stiffness   . Prednisone Other (See Comments)    Reaction:  Psychosis   . Vicodin [Hydrocodone-Acetaminophen] Other (See Comments)    Reaction:  Muscle stiffness   . Oxycontin [Oxycodone Hcl] Other (See Comments)    Reaction:  Muscle stiffness   . Peanut-Containing Drug Products Diarrhea    VITALS:  Blood pressure 83/53, pulse 81, temperature 97.6 F (36.4 C), temperature source Oral, resp. rate 17, height  (1.499 m), weight 39.145 kg (86 lb 4.8 oz), SpO2 96 %.  PHYSICAL EXAMINATION:  Physical Exam  GENERAL:  55 y.o.-year-old patient lying in the bed with no acute distress. Patient has ALS. Appears contracted in bed EYES: Pupils equal, round, reactive to light and accommodation. No scleral icterus. Extraocular muscles intact.  HEENT: Head atraumatic, normocephalic. Oropharynx and nasopharynx clear.  NECK:  Supple, no jugular venous distention. No thyroid enlargement, no tenderness.  LUNGS: Normal breath sounds bilaterally, no wheezing, rales,rhonchi or crepitation. No use of accessory muscles of  respiration. Decreased bibasilar breath sounds CARDIOVASCULAR: S1, S2 normal. No murmurs, rubs, or gallops.  ABDOMEN: Soft, nontender, nondistended. Bowel sounds present. No organomegaly or mass.  EXTREMITIES: No pedal edema, cyanosis, or clubbing.  NEUROLOGIC: Lying in bed, contracted appearing, mumbling- speech. following commands, occasionally tearful PSYCHIATRIC: The patient is more alert and following some simple commands. Appears weak overall. SKIN: No obvious rash, lesion, or ulcer.    LABORATORY PANEL:   CBC  Recent Labs Lab 09/08/15 0457  WBC 14.8*  HGB 9.0*  HCT 27.0*  PLT 266   ------------------------------------------------------------------------------------------------------------------  Chemistries   Recent Labs Lab 09/04/15 2101  09/08/15 0457  NA 136  < > 142  K 3.1*  < > 4.0  CL 100*  < > 118*  CO2 26  < > 19*  GLUCOSE 82  < > 136*  BUN 15  < > 15  CREATININE 0.69  < > 0.48  CALCIUM 9.0  < > 7.8*  MG  --   < > 1.8  AST 20  --   --   ALT 11*  --   --   ALKPHOS 51  --   --   BILITOT 0.9  --   --   < > = values in this interval not displayed. ------------------------------------------------------------------------------------------------------------------  Cardiac Enzymes  Recent Labs Lab 09/04/15 2101  TROPONINI 0.03   ------------------------------------------------------------------------------------------------------------------  RADIOLOGY:  No results found.  EKG:   Orders placed or performed during the hospital encounter of 09/04/15  . ED EKG 12-Lead  . ED EKG  12-Lead    ASSESSMENT AND PLAN:   55 year old female with known history of ALS with chronic Foley catheter and prior history of multiple UTIs, depression, GERD was brought in secondary to altered mental status.  #1 Metabolic encephalopathy-secondary to sepsis -CT of the head not done on admission.  -Improving mental status.  #2 Sepsis- unknown source, could be viral  as well -ID consult appreciated.  -Due to spiking fevers last evening, and risk of multidrug resistant organisms in urine, antibiotics have been changed to daptomycin and meropenem.   Repeat urine cultures are negative so far. -Blood cultures from admission are negative and also urine culture did not grow any organism from admission. Influenza test is negative. - will need to figure out the discharge antibiotic  #3 hypokalemia-known history of chronic hypokalemia and takes potassium supplements at home. Replaced as needed now  #4 seizure disorder-continue Keppra  this time.  #5 ALS-following with neurologist at [facility]. -Bed bound at baseline, but still able to sit with assistance and feed herself and alert and interactive. -Continue to monitor as patient is not close to her baseline. Also takes antidepressants and muscle relaxants for the same. Restart as more alert and able to take oral meds  #6 Dysphagia- appreciate speech therapy consult. Started on dysphagia diet  #7 Severe Malnutrition- dietary consulted Dietary supplements recommended  #7 DVT prophylaxis-on Lovenox   All the records are reviewed and case discussed with Care Management/Social Workerr. Management plans discussed with the patient, family and they are in agreement.  CODE STATUS: Full Code  TOTAL TIME TAKING CARE OF THIS PATIENT: 37 minutes.   POSSIBLE D/C IN 1-2 DAYS, DEPENDING ON CLINICAL CONDITION.   Enid Baas M.D on 09/08/2015 at 8:10 AM  Between 7am to 6pm - Pager - [phone]  After 6pm go to [email or web address] - password [email or web address] [facility]  [facility]  Office  [phone]  CC: Primary care physician; Ruthe Mannan, MD

## 2015-09-08 NOTE — Progress Notes (Signed)
Sodium up to 142 ,chloride elvated . Pt with ns with 20k at 75 and received dose magnesium today. seruk k 4.0 . Spoke with dr Nemiah Commander . md orders decrease ivf to 50/hr

## 2015-09-08 NOTE — Care Management Note (Signed)
Case Management Note  Patient Details  Name: Uchenna Seufert MRN: [id number] Date of Birth: 12-09-1960  Subjective/Objective:     54yo Mrs Gunda Maqueda was admitted 09/04/15 with AMS from a UTI. Mrs Gortney resides at home with her husband. She has a diagnosis of ALS, is bedbound, and has a chronic indwelling foley. Husband has numerous home assistive devices. Anticipate discharge home with husband. Per Wilhemina Cash at [facility], Mrs Matthies is an active patient of [facility] and is receiving home health RN and Nurse Aid services.               Action/Plan:   Expected Discharge Date:                  Expected Discharge Plan:     In-House Referral:     Discharge planning Services  CM Consult  Post Acute Care Choice:    Choice offered to:  Patient (caregiver)  DME Arranged:    DME Agency:     HH Arranged:  RN, NA HH Agency:  [facility]  Status of Service:  In process, will continue to follow  Medicare Important Message Given:    Date Medicare IM Given:    Medicare IM give by:    Date Additional Medicare IM Given:    Additional Medicare Important Message give by:     If discussed at Long Length of Stay Meetings, dates discussed:    Additional Comments:  Lynlee Stratton A, RN 09/08/2015, 4:41 PM

## 2015-09-08 NOTE — Progress Notes (Signed)
MEDICATION RELATED CONSULT NOTE - follow up  Pharmacy Consult for Electrolytes Indication: Calcium , Potassium   Allergies  Allergen Reactions  . Bee Venom Anaphylaxis  . Hydrocodone Other (See Comments)    Reaction:  Muscle stiffness   . Lipitor [Atorvastatin] Other (See Comments)    Reaction:  Muscle cramps   . Shellfish Allergy Anaphylaxis  . Oxycodone Other (See Comments)    Reaction:  Muscle stiffness   . Prednisone Other (See Comments)    Reaction:  Psychosis   . Vicodin [Hydrocodone-Acetaminophen] Other (See Comments)    Reaction:  Muscle stiffness   . Oxycontin [Oxycodone Hcl] Other (See Comments)    Reaction:  Muscle stiffness   . Peanut-Containing Drug Products Diarrhea    Patient Measurements: Height:  (149.9 cm) Weight: 86 lb 4.8 oz (39.145 kg) IBW/kg (Calculated) : 43.2 Adjusted Body Weight:   Vital Signs: Temp: 97.6 F (36.4 C) (02/12 0737) Temp Source: Oral (02/12 0737) BP: 83/53 mmHg (02/12 0737) Pulse Rate: 81 (02/12 0737) Intake/Output from previous day: 02/11 0701 - 02/12 0700 In: 2744.3 [P.O.:120; I.V.:1906.3; IV Piggyback:718] Out: 275 [Urine:275] Intake/Output from this shift:    Labs:  Recent Labs  09/05/15 1751 09/06/15 0513 09/07/15 0444 09/08/15 0457  WBC  --  15.2*  --  14.8*  HGB  --  10.4*  --  9.0*  HCT  --  30.0*  --  27.0*  PLT  --  238  --  266  CREATININE 0.63 0.58 0.55 0.48  MG 1.5*  --  1.7 1.8   Lab Results  Component Value Date   K 4.0 09/08/2015   Lab Results  Component Value Date   CALCIUM 7.8* 09/08/2015    Estimated Creatinine Clearance: 49.6 mL/min (by C-G formula based on Cr of 0.48).   Medical History: Past Medical History  Diagnosis Date  . ALS (amyotrophic lateral sclerosis) (HCC)   . Depression   . GERD (gastroesophageal reflux disease)   . Urinary retention   . Dysphagia, oropharyngeal 11/28/2013  . CN (constipation) 11/28/2013    Medications:  Scheduled:  . buPROPion  300 mg Oral BID   . DAPTOmycin (CUBICIN)  IV  200 mg Intravenous Q24H  . enoxaparin (LOVENOX) injection  30 mg Subcutaneous Q24H  . levETIRAcetam  500 mg Intravenous Q12H  . magnesium sulfate 1 - 4 g bolus IVPB  2 g Intravenous Once  . meropenem (MERREM) IV  1 g Intravenous Q12H  . tiZANidine  2 mg Oral QHS    Assessment: Pharmacy consulted to manage electrolytes in this 55 year old female. 2/09 @ 18:00 :   K = 3.3                           Mag = 1.5                           Ca = 8.3 ,  Albumin = 3.7                           Corrected Ca = 8.54  Goal of Therapy:  K:  3.5 - 5.1 Ca:  8.9 - 10.3 Mag: 1.7 - 2.4   Plan:  Will order KCl 10 mEq IV X 4. Will order Calcium gluconate 1 gm IV X 1. Will order Magnesium Sulfate 2 gm IV X 1. Will recheck electrolytes on  2/10 with AM labs.   2/10 AM electrolytes WNL except for corrected Calcium 8.4. Will order 1 gram calcium gluconate IV x1 dose and recheck BMP and Mg tomorrow AM.  2/11 AM electrolytes WNL except calcium still low. Calcium ordered yesterday apparently was not given. Will reorder 1 gram calcium gluconate for this AM and recheck BMP in AM.  2/12 K=4.0  Mag=1.8  Ca=7.8 Alb= 3.7  Corrected Ca=8.04. Will give Magnesium sulfate 2gm IV x1 for goal Mag > 2.0.( Need to correct hypomagnesemia prior to Calcium). Will also check Phos level. Labs in am.   Jennifer [name] A 09/08/2015,9:55 AM

## 2015-09-09 LAB — BASIC METABOLIC PANEL
ANION GAP: 3 — AB (ref 5–15)
BUN: 13 mg/dL (ref 6–20)
CALCIUM: 8 mg/dL — AB (ref 8.9–10.3)
CHLORIDE: 120 mmol/L — AB (ref 101–111)
CO2: 22 mmol/L (ref 22–32)
Creatinine, Ser: 0.37 mg/dL — ABNORMAL LOW (ref 0.44–1.00)
GFR calc non Af Amer: >60 mL/min (ref >60)
Glucose, Bld: 120 mg/dL — ABNORMAL HIGH (ref 65–99)
Potassium: 3.9 mmol/L (ref 3.5–5.1)
SODIUM: 145 mmol/L (ref 135–145)

## 2015-09-09 LAB — POTASSIUM: POTASSIUM: 4 mmol/L (ref 3.5–5.1)

## 2015-09-09 LAB — CULTURE, BLOOD (ROUTINE X 2)
CULTURE: NO GROWTH
CULTURE: NO GROWTH

## 2015-09-09 LAB — C DIFFICILE QUICK SCREEN W PCR REFLEX
C DIFFICILE (CDIFF) TOXIN: NEGATIVE
C Diff antigen: POSITIVE — AB

## 2015-09-09 LAB — PHOSPHORUS
PHOSPHORUS: 1.1 mg/dL — AB (ref 2.5–4.6)
Phosphorus: 2.4 mg/dL — ABNORMAL LOW (ref 2.5–4.6)

## 2015-09-09 LAB — MAGNESIUM: MAGNESIUM: 2.1 mg/dL (ref 1.7–2.4)

## 2015-09-09 LAB — CLOSTRIDIUM DIFFICILE BY PCR: CDIFFPCR: POSITIVE — AB

## 2015-09-09 MED ORDER — NON FORMULARY: 1.0000 | Freq: Two times a day (BID) | Status: DC

## 2015-09-09 MED ORDER — METRONIDAZOLE IN NACL 5-0.79 MG/ML-% IV SOLN
500.0000 mg | Freq: Three times a day (TID) | INTRAVENOUS | Status: DC
  Administered 2015-09-09 – 2015-09-10 (×3): 500 mg via INTRAVENOUS
  Filled 2015-09-09 (×4): qty 100

## 2015-09-09 MED ORDER — DAPTOMYCIN 500 MG IV SOLR
200.0000 mg | INTRAVENOUS | Status: DC
Start: 2015-09-09 — End: 2015-09-10
  Administered 2015-09-09: 200 mg via INTRAVENOUS
  Filled 2015-09-09 (×2): qty 4

## 2015-09-09 MED ORDER — POTASSIUM PHOSPHATES 15 MMOLE/5ML IV SOLN
20.0000 mmol | Freq: Once | INTRAVENOUS | Status: AC
  Administered 2015-09-09: 20 mmol via INTRAVENOUS
  Filled 2015-09-09: qty 6.67

## 2015-09-09 MED ORDER — SODIUM CHLORIDE 0.9 % IV SOLN
500.0000 mg | Freq: Three times a day (TID) | INTRAVENOUS | Status: DC
  Filled 2015-09-09 (×2): qty 0.5

## 2015-09-09 MED ORDER — DEXTROMETHORPHAN-QUINIDINE 20-10 MG PO CAPS
1.0000 | ORAL_CAPSULE | Freq: Two times a day (BID) | ORAL | Status: DC
  Administered 2015-09-09 – 2015-09-10 (×4): 1 via ORAL
  Filled 2015-09-09: qty 1

## 2015-09-09 NOTE — Care Management Note (Signed)
Case Management Note  Patient Details  Name: Jennifer [name] MRN: [id number] Date of Birth: Nov 09, 1960  Subjective/Objective:    Seen by Dr Sampson Goon today. Test results pending for C-diff for diarrhea, and for urinary VRE. Meropenem discontinued and Daptomycin is continued.               Action/Plan:   Expected Discharge Date:                  Expected Discharge Plan:     In-House Referral:     Discharge planning Services  CM Consult  Post Acute Care Choice:    Choice offered to:  Patient (caregiver)  DME Arranged:    DME Agency:     HH Arranged:  RN, NA HH Agency:  [facility]  Status of Service:  In process, will continue to follow  Medicare Important Message Given:    Date Medicare IM Given:    Medicare IM give by:    Date Additional Medicare IM Given:    Additional Medicare Important Message give by:     If discussed at Long Length of Stay Meetings, dates discussed:    Additional Comments:  Rowan Blaker A, RN 09/09/2015, 3:07 PM

## 2015-09-09 NOTE — Progress Notes (Signed)
Patients spouse questioned why patient was not receiving her nuedexta while she was here.  She has gone 3 days without this medication.  Called pharmacy found we are unable to supply.  Dr. Elisabeth Pigeon approved patient bringing medication in for use.  Added non-formulary use.

## 2015-09-09 NOTE — Progress Notes (Signed)
Patient brief changed and noticed that rectum is protruding more than earlier in shift.  Paged [facility], Dr. Elpidio Anis returned call advised it appears like rectal prolapse.  He will add surgical consult since patient is crying with pain during changing after bowel movements.

## 2015-09-09 NOTE — Progress Notes (Signed)
[facility] CLINIC INFECTIOUS DISEASE PROGRESS NOTE Date of Admission:  09/04/2015     ID: Jennifer [name] is a 55 y.o. female with recurrent UTI  Active Problems:   UTI (lower urinary tract infection)   Pressure ulcer   Protein-calorie malnutrition, severe  Subjective:   ROS  Eleven systems are reviewed and negative except per hpi  Medications:  Antibiotics Given (last 72 hours)    Date/Time Action Medication Dose Rate   09/06/15 2040 Given   DAPTOmycin (CUBICIN) 200 mg in sodium chloride 0.9 % IVPB 200 mg 208 mL/hr   09/06/15 2340 Given   meropenem (MERREM) 1 g in sodium chloride 0.9 % 100 mL IVPB 1 g 200 mL/hr   09/07/15 0954 Given   meropenem (MERREM) 1 g in sodium chloride 0.9 % 100 mL IVPB 1 g 200 mL/hr   09/07/15 1928 Given   DAPTOmycin (CUBICIN) 200 mg in sodium chloride 0.9 % IVPB 200 mg 208 mL/hr   09/07/15 2209 Given   meropenem (MERREM) 1 g in sodium chloride 0.9 % 100 mL IVPB 1 g 200 mL/hr   09/08/15 1017 Given   meropenem (MERREM) 1 g in sodium chloride 0.9 % 100 mL IVPB 1 g 200 mL/hr   09/08/15 1754 Given   DAPTOmycin (CUBICIN) 200 mg in sodium chloride 0.9 % IVPB 200 mg 208 mL/hr   09/08/15 2310 Given   meropenem (MERREM) 1 g in sodium chloride 0.9 % 100 mL IVPB 1 g 200 mL/hr     . buPROPion  300 mg Oral BID  . DAPTOmycin (CUBICIN)  IV  200 mg Intravenous Q24H  . Dextromethorphan-Quinidine  1 capsule Oral BID  . enoxaparin (LOVENOX) injection  30 mg Subcutaneous Q24H  . levETIRAcetam  500 mg Intravenous Q12H  . meropenem (MERREM) IV  500 mg Intravenous 3 times per day  . potassium phosphate IVPB (mmol)  20 mmol Intravenous Once  . tiZANidine  2 mg Oral QHS    Objective: Vital signs in last 24 hours: Temp:  [97.8 F (36.6 C)-99.6 F (37.6 C)] 99.1 F (37.3 C) (02/13 0743) Pulse Rate:  [96-99] 99 (02/13 0743) Resp:  [18-40] 18 (02/13 0743) BP: (88-128)/(60-71) 128/71 mmHg (02/13 0743) SpO2:  [91 %-92 %] 92 % (02/13 0743) Weight:  [39.372 kg (86 lb 12.8  oz)] 39.372 kg (86 lb 12.8 oz) (02/13 0500) GENERAL: curled on side. Thin EYES: Pupils equal, round, reactive to light and accommodation. No scleral icterus.  HEENT: Head atraumatic, normocephalic. No oropharyngeal erythema, moist oral dry NECK: Supple, no jugular venous distention. No thyroid enlargement, no tenderness.  LUNGS: Normal breath sounds bilaterally, no wheezing, rales, rhonchi. No use of accessory muscles of respiration.  CARDIOVASCULAR: S1, S2 normal.  ABDOMEN: Soft, nontender, nondistended. Bowel sounds present. No organomegaly or mass.  EXTREMITIES: No pedal edema, cyanosis, or clubbing. + 2 pedal & radial pulses b/l. Diffuse muscle aches NEUROLOGIC: Slumped to the right side. Does not follow commands. PSYCHIATRIC: The patient is drowsy. SKIN: No obvious rash, lesion, or ulcer. Foley in place with concentrated urine   Lab Results  Recent Labs  09/08/15 0457 09/09/15 0427  WBC 14.8*  --   HGB 9.0*  --   HCT 27.0*  --   NA 142 145  K 4.0 3.9  CL 118* 120*  CO2 19* 22  BUN 15 13  CREATININE 0.48 0.37*    Microbiology: Results for orders placed or performed during the hospital encounter of 09/04/15  Urine culture  Status: None   Collection Time: 09/04/15  9:01 PM  Result Value Ref Range Status   Specimen Description URINE, RANDOM  Final   Special Requests NONE  Final   Culture MULTIPLE SPECIES PRESENT, SUGGEST RECOLLECTION  Final   Report Status 09/06/2015 FINAL  Final  Blood Culture (routine x 2)     Status: None   Collection Time: 09/04/15  9:41 PM  Result Value Ref Range Status   Specimen Description BLOOD RIGHT ARM  Final   Special Requests BOTTLES DRAWN AEROBIC AND ANAEROBIC  Final   Culture NO GROWTH 5 DAYS  Final   Report Status 09/09/2015 FINAL  Final  Blood Culture (routine x 2)     Status: None   Collection Time: 09/04/15  9:41 PM  Result Value Ref Range Status   Specimen Description BLOOD LEFT ARM  Final   Special Requests  BOTTLES DRAWN AEROBIC AND ANAEROBIC  Final   Culture NO GROWTH 5 DAYS  Final   Report Status 09/09/2015 FINAL  Final  Urine culture     Status: None (Preliminary result)   Collection Time: 09/06/15  4:58 PM  Result Value Ref Range Status   Specimen Description URINE, CATHETERIZED  Final   Special Requests ertapenem and vanco Normal  Final   Culture   Final    >=100,000 COLONIES/mL GRAM POSITIVE COCCI REPEATING ID AND SENSITIVITIES    Report Status PENDING  Incomplete    Studies/Results: No results found.  Assessment/Plan: Janisse Ghan is a 55 y.o. 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 Prior cxs with VRE and Klebsiella - Admit cx mixed.  Foley changed this admission - she was due to have suprapubic cath placed but declined. Has not been on suppressive abx, gets foley changed q 2 weeks at home.  She had CT scan in June to look for other reasons for recurrent UTI . Flu negative Her fever spiked again so changed to dapto and meropenem and improving some.  Repeat UCX with > 100 K GPC - spoke to micro- is enterococcus- likely VRE but final test pending  Recommendations Continue daptomycin -for probable VRE I have dced meropenem. For the diarrhea will check C diff test. Thank you very much for the consult. Will follow with you.  Mekiyah Gladwell   09/09/2015, 2:37 PM

## 2015-09-09 NOTE — Progress Notes (Signed)
[facility] [place] at [facility]   PATIENT NAME: Jennifer [name]    MR#:  [id number]  DATE OF BIRTH:  1961/02/21  SUBJECTIVE:  CHIEF COMPLAINT:   Chief Complaint  Patient presents with  . Altered Mental Status   - Patient more alert and close to her baseline.   Not sure if she is in pain. -Caregiver at bedside and states patient is almost back to her baseline. -no fever in last 1 day  REVIEW OF SYSTEMS:  Review of Systems  Unable to perform ROS: mental acuity    DRUG ALLERGIES:   Allergies  Allergen Reactions  . Bee Venom Anaphylaxis  . Hydrocodone Other (See Comments)    Reaction:  Muscle stiffness   . Lipitor [Atorvastatin] Other (See Comments)    Reaction:  Muscle cramps   . Shellfish Allergy Anaphylaxis  . Oxycodone Other (See Comments)    Reaction:  Muscle stiffness   . Prednisone Other (See Comments)    Reaction:  Psychosis   . Vicodin [Hydrocodone-Acetaminophen] Other (See Comments)    Reaction:  Muscle stiffness   . Oxycontin [Oxycodone Hcl] Other (See Comments)    Reaction:  Muscle stiffness   . Peanut-Containing Drug Products Diarrhea    VITALS:  Blood pressure 122/90, pulse 105, temperature 100 F (37.8 C), temperature source Oral, resp. rate 48, height  (1.499 m), weight 39.372 kg (86 lb 12.8 oz), SpO2 93 %.  PHYSICAL EXAMINATION:  Physical Exam  GENERAL:  55 y.o.-year-old patient lying in the bed with no acute distress. Patient has ALS. Appears contracted in bed EYES: Pupils equal, round, reactive to light and accommodation. No scleral icterus. Extraocular muscles intact.  HEENT: Head atraumatic, normocephalic. Oropharynx and nasopharynx clear.  NECK:  Supple, no jugular venous distention. No thyroid enlargement, no tenderness.  LUNGS: Normal breath sounds bilaterally, no wheezing, rales,rhonchi or crepitation. No use of accessory muscles of respiration. Decreased bibasilar breath sounds CARDIOVASCULAR: S1, S2  normal. No murmurs, rubs, or gallops.  ABDOMEN: Soft, nontender, nondistended. Bowel sounds present. No organomegaly or mass.  EXTREMITIES: No pedal edema, cyanosis, or clubbing.  NEUROLOGIC: Lying in bed, contracted appearing, mumbling- speech. following commands, occasionally tearful PSYCHIATRIC: The patient is more alert and following some simple commands. Appears weak overall. SKIN: No obvious rash, lesion, or ulcer.    LABORATORY PANEL:   CBC  Recent Labs Lab 09/08/15 0457  WBC 14.8*  HGB 9.0*  HCT 27.0*  PLT 266   ------------------------------------------------------------------------------------------------------------------  Chemistries   Recent Labs Lab 09/04/15 2101  09/09/15 0427 09/09/15 1839  NA 136  < > 145  --   K 3.1*  < > 3.9 4.0  CL 100*  < > 120*  --   CO2 26  < > 22  --   GLUCOSE 82  < > 120*  --   BUN 15  < > 13  --   CREATININE 0.69  < > 0.37*  --   CALCIUM 9.0  < > 8.0*  --   MG  --   < > 2.1  --   AST 20  --   --   --   ALT 11*  --   --   --   ALKPHOS 51  --   --   --   BILITOT 0.9  --   --   --   < > = values in this interval not displayed. ------------------------------------------------------------------------------------------------------------------  Cardiac Enzymes  Recent Labs Lab 09/04/15 2101  TROPONINI 0.03   ------------------------------------------------------------------------------------------------------------------  RADIOLOGY:  No results found.  EKG:   Orders placed or performed during the hospital encounter of 09/04/15  . ED EKG 12-Lead  . ED EKG 12-Lead    ASSESSMENT AND PLAN:   55 year old female with known history of ALS with chronic Foley catheter and prior history of multiple UTIs, depression, GERD was brought in secondary to altered mental status.  #1 Metabolic encephalopathy-secondary to sepsis -CT of the head not done on admission.  -Improving mental status.  #2 Sepsis- UTI -ID consult  appreciated.  - risk of multidrug resistant organisms in urine, antibiotics have been changed to daptomycin and meropenem.  - Influenza test is negative. - will need to figure out the discharge antibiotic - ID stopped Meropenem,waitfor final report.   #3 hypokalemia-known history of chronic hypokalemia and takes potassium supplements at home. Replaced as needed now  #4 seizure disorder-continue Keppra  this time.  #5 ALS-following with neurologist at [facility] -Bed bound at baseline, but still able to sit with assistance and feed herself and alert and interactive. -Continue to monitor as patient is not close to her baseline. Also takes antidepressants and muscle relaxants for the same. Restart now as more alert and able to take oral meds  #6 Dysphagia- appreciate speech therapy consult. Started on dysphagia diet  #7 Severe Malnutrition- dietary consulted Dietary supplements recommended  #7 DVT prophylaxis-on Lovenox   All the records are reviewed and case discussed with Care Management/Social Workerr. Management plans discussed with the patient, family and they are in agreement.  CODE STATUS: Full Code  TOTAL TIME TAKING CARE OF THIS PATIENT: 35 minutes.  Spoke to her husband on phone. POSSIBLE D/C IN 1-2 DAYS, DEPENDING ON CLINICAL CONDITION.   Altamese Dilling M.D on 09/09/2015 at 10:13 PM  Between 7am to 6pm - Pager - [phone]  After 6pm go to [email or web address] - password [email or web address] [facility]  [place] [facility]  Office  [phone]  CC: Primary care physician; Ruthe Mannan, MD

## 2015-09-09 NOTE — Progress Notes (Signed)
Pharmacy Antibiotic Note  Jennifer [name] is a 55 y.o. female admitted on 09/04/2015 with UTI/sepsis.  Pharmacy has been consulted for daptomycin per ID for continued fevers after broad-spectrum therapy and a history of VRE UTIs.  Plan:  Continue  daptomycin  IV q 24hrs. Will continue to follow for signs of infection resolution.  UA: LE(+) NO2(-) WBC TNTC Blood and urine cx pending CXR: Clear  Height:  (149.9 cm) Weight: 86 lb 12.8 oz (39.372 kg) IBW/kg (Calculated) : 43.2  Temp (24hrs), Avg:99.4 F (37.4 C), Min:99.1 F (37.3 C), Max:99.6 F (37.6 C)   Recent Labs Lab 09/04/15 2101 09/05/15 0519 09/05/15 1751 09/06/15 0513 09/07/15 0444 09/08/15 0457 09/09/15 0427  WBC 11.3* 11.7*  --  15.2*  --  14.8*  --   CREATININE 0.69 0.48 0.63 0.58 0.55 0.48 0.37*  LATICACIDVEN 2.1* 0.8  --   --   --   --   --     Estimated Creatinine Clearance: 50 mL/min (by C-G formula based on Cr of 0.37).    Allergies  Allergen Reactions  . Bee Venom Anaphylaxis  . Hydrocodone Other (See Comments)    Reaction:  Muscle stiffness   . Lipitor [Atorvastatin] Other (See Comments)    Reaction:  Muscle cramps   . Shellfish Allergy Anaphylaxis  . Oxycodone Other (See Comments)    Reaction:  Muscle stiffness   . Prednisone Other (See Comments)    Reaction:  Psychosis   . Vicodin [Hydrocodone-Acetaminophen] Other (See Comments)    Reaction:  Muscle stiffness   . Oxycontin [Oxycodone Hcl] Other (See Comments)    Reaction:  Muscle stiffness   . Peanut-Containing Drug Products Diarrhea   Anti-infectives    Start     Dose/Rate Route Frequency Ordered Stop   09/09/15 1800  DAPTOmycin (CUBICIN) 200 mg in sodium chloride 0.9 % IVPB     200 mg 208 mL/hr over 30 Minutes Intravenous Every 24 hours 09/09/15 0921     09/09/15 1400  meropenem (MERREM) 500 mg in sodium chloride 0.9 % 50 mL IVPB  Status:  Discontinued     500 mg 100 mL/hr over 30 Minutes Intravenous 3 times per day 09/09/15  0922 09/09/15 1448   09/06/15 2200  meropenem (MERREM) 1 g in sodium chloride 0.9 % 100 mL IVPB  Status:  Discontinued     1 g 200 mL/hr over 30 Minutes Intravenous Every 12 hours 09/06/15 1850 09/09/15 0922   09/06/15 1900  DAPTOmycin (CUBICIN) 200 mg in sodium chloride 0.9 % IVPB  Status:  Discontinued     200 mg 208 mL/hr over 30 Minutes Intravenous Every 24 hours 09/06/15 1850 09/09/15 0921   09/06/15 1000  ertapenem (INVANZ) 1 g in sodium chloride 0.9 % 50 mL IVPB  Status:  Discontinued     1 g 100 mL/hr over 30 Minutes Intravenous Every 24 hours 09/04/15 2358 09/06/15 1714   09/05/15 1400  vancomycin (VANCOCIN) IVPB 750 mg/150 ml premix  Status:  Discontinued     750 mg 150 mL/hr over 60 Minutes Intravenous Every 18 hours 09/05/15 0249 09/06/15 1458   09/05/15 0300  ertapenem (INVANZ) 1 g in sodium chloride 0.9 % 50 mL IVPB     1 g 100 mL/hr over 30 Minutes Intravenous  Once 09/05/15 0253 09/05/15 0725   09/04/15 2200  piperacillin-tazobactam (ZOSYN) IVPB 3.375 g     3.375 g 100 mL/hr over 30 Minutes Intravenous  Once 09/04/15 2157 09/04/15 2243   09/04/15  2200  vancomycin (VANCOCIN) IVPB 1000 mg/200 mL premix     1,000 mg 200 mL/hr over 60 Minutes Intravenous  Once 09/04/15 2157 09/04/15 2312      Results for orders placed or performed during the hospital encounter of 09/04/15  Urine culture     Status: None   Collection Time: 09/04/15  9:01 PM  Result Value Ref Range Status   Specimen Description URINE, RANDOM  Final   Special Requests NONE  Final   Culture MULTIPLE SPECIES PRESENT, SUGGEST RECOLLECTION  Final   Report Status 09/06/2015 FINAL  Final  Blood Culture (routine x 2)     Status: None   Collection Time: 09/04/15  9:41 PM  Result Value Ref Range Status   Specimen Description BLOOD RIGHT ARM  Final   Special Requests BOTTLES DRAWN AEROBIC AND ANAEROBIC  Final   Culture NO GROWTH 5 DAYS  Final   Report Status 09/09/2015 FINAL  Final  Blood Culture (routine x  2)     Status: None   Collection Time: 09/04/15  9:41 PM  Result Value Ref Range Status   Specimen Description BLOOD LEFT ARM  Final   Special Requests BOTTLES DRAWN AEROBIC AND ANAEROBIC  Final   Culture NO GROWTH 5 DAYS  Final   Report Status 09/09/2015 FINAL  Final  Urine culture     Status: None (Preliminary result)   Collection Time: 09/06/15  4:58 PM  Result Value Ref Range Status   Specimen Description URINE, CATHETERIZED  Final   Special Requests ertapenem and vanco Normal  Final   Culture   Final    >=100,000 COLONIES/mL GRAM POSITIVE COCCI REPEATING ID AND SENSITIVITIES    Report Status PENDING  Incomplete     Thank you for allowing pharmacy to be a part of this patient's care.  Patsie Mccardle D 09/09/2015 3:30 PM

## 2015-09-09 NOTE — Progress Notes (Signed)
Lab called and patient is positive for C-Diff. Dr. called, new orders given will continue to monitor.

## 2015-09-09 NOTE — Progress Notes (Signed)
Patient ate entire magic cup, half pudding, two bites of beef and mashed potatoes.

## 2015-09-09 NOTE — Progress Notes (Addendum)
MEDICATION RELATED CONSULT NOTE - follow up  Pharmacy Consult for Electrolytes Indication: electrolyte   Allergies  Allergen Reactions  . Bee Venom Anaphylaxis  . Hydrocodone Other (See Comments)    Reaction:  Muscle stiffness   . Lipitor [Atorvastatin] Other (See Comments)    Reaction:  Muscle cramps   . Shellfish Allergy Anaphylaxis  . Oxycodone Other (See Comments)    Reaction:  Muscle stiffness   . Prednisone Other (See Comments)    Reaction:  Psychosis   . Vicodin [Hydrocodone-Acetaminophen] Other (See Comments)    Reaction:  Muscle stiffness   . Oxycontin [Oxycodone Hcl] Other (See Comments)    Reaction:  Muscle stiffness   . Peanut-Containing Drug Products Diarrhea    Patient Measurements: Height:  (149.9 cm) Weight: 86 lb 12.8 oz (39.372 kg) IBW/kg (Calculated) : 43.2 Adjusted Body Weight:   Vital Signs: Temp: 99.1 F (37.3 C) (02/13 0743) Temp Source: Oral (02/13 0743) BP: 128/71 mmHg (02/13 0743) Pulse Rate: 99 (02/13 0743) Intake/Output from previous day: 02/12 0701 - 02/13 0700 In: 1885.4 [I.V.:1375.4; IV Piggyback:410] Out: 450 [Urine:450] Intake/Output from this shift:    Labs:  Recent Labs  09/07/15 0444 09/08/15 0457 09/09/15 0427  WBC  --  14.8*  --   HGB  --  9.0*  --   HCT  --  27.0*  --   PLT  --  266  --   CREATININE 0.55 0.48 0.37*  MG 1.7 1.8 2.1  PHOS  --   --  1.1*   Lab Results  Component Value Date   K 3.9 09/09/2015   Lab Results  Component Value Date   CALCIUM 8.0* 09/09/2015   PHOS 1.1* 09/09/2015    Estimated Creatinine Clearance: 50 mL/min (by C-G formula based on Cr of 0.37).   Medical History: Past Medical History  Diagnosis Date  . ALS (amyotrophic lateral sclerosis) (HCC)   . Depression   . GERD (gastroesophageal reflux disease)   . Urinary retention   . Dysphagia, oropharyngeal 11/28/2013  . CN (constipation) 11/28/2013    Medications:  Scheduled:  . buPROPion  300 mg Oral BID  . DAPTOmycin  (CUBICIN)  IV  200 mg Intravenous Q24H  . Dextromethorphan-Quinidine  1 capsule Oral BID  . enoxaparin (LOVENOX) injection  30 mg Subcutaneous Q24H  . levETIRAcetam  500 mg Intravenous Q12H  . potassium phosphate IVPB (mmol)  20 mmol Intravenous Once  . tiZANidine  2 mg Oral QHS    Assessment: Pharmacy consulted to manage electrolytes in this 55 year old female.   Goal of Therapy:  K:  3.5 - 5.1 Ca:  8.9 - 10.3 Mag: 1.7 - 2.4   Plan:  Patient ordered Potassium Phosphate IV x 1. Will obtain follow-up electrolytes with am labs.   Pharmacy will continue to monitor and adjust per consult.    James,Teldrin D 09/09/2015,3:29 PM

## 2015-09-09 NOTE — Progress Notes (Signed)
Nutrition Follow-up  DOCUMENTATION CODES:   Severe malnutrition in context of chronic illness  INTERVENTION:   Meals and Snacks: Cater to patient preferences; SLP following Medical Food Supplement Therapy: will recommend Mighty Shakes on meal trays TID and Magic Cup BID for added nutrition (each supplement provides approximately 300kcals and 9g protein)   NUTRITION DIAGNOSIS:   Inadequate oral intake related to acute illness as evidenced by NPO status. Being addressed with diet progression, SLP following  GOAL:   Patient will meet greater than or equal to 90% of their needs; ongoing  MONITOR:    (Energy intake)  REASON FOR ASSESSMENT:   Malnutrition Screening Tool    ASSESSMENT:    Pt remains on isolation. Per I&D MD note, checking for c.diff as well for diarrhea.  Diet Order:  DIET - DYS 1 Room service appropriate?: Yes with Assist; Fluid consistency:: Nectar Thick    Current Nutrition: Per CNA, [place], pt ate a little bit of Cream of wheat this am at breakfast. Pt ate well with family at lunch per report, including added Magic Cup to tray. CNA cared for pt yesterday as well and reports that pt ate very well for lunch yesterday eating everything except a few bites of green beans, but did very well with pureed chicken and mashed potatoes. CNA reports pt drinks liquids very well.   Gastrointestinal Profile: Last BM: 09/09/2015   Scheduled Medications:  . buPROPion  300 mg Oral BID  . DAPTOmycin (CUBICIN)  IV  200 mg Intravenous Q24H  . Dextromethorphan-Quinidine  1 capsule Oral BID  . enoxaparin (LOVENOX) injection  30 mg Subcutaneous Q24H  . levETIRAcetam  500 mg Intravenous Q12H  . potassium phosphate IVPB (mmol)  20 mmol Intravenous Once  . tiZANidine  2 mg Oral QHS    Continuous Medications:  . 0.9 % NaCl with KCl 20 mEq / L 50 mL/hr at 09/09/15 1039     Electrolyte/Renal Profile and Glucose Profile:   Recent Labs Lab 09/07/15 0444 09/08/15 0457  09/09/15 0427  NA 139 142 145  K 3.6 4.0 3.9  CL 114* 118* 120*  CO2 19* 19* 22  BUN CREATININE 0.55 0.48 0.37*  CALCIUM 7.8* 7.8* 8.0*  MG 1.7 1.8 2.1  PHOS  --   --  1.1*  GLUCOSE 97 136* 120*   Protein Profile:  Recent Labs Lab 09/04/15 2101  ALBUMIN 3.7    Weight Trend since Admission: Filed Weights   09/07/15 0632 09/08/15 0453 09/09/15 0500  Weight: 83 lb 11.2 oz (37.966 kg) 86 lb 4.8 oz (39.145 kg) 86 lb 12.8 oz (39.372 kg)     Skin:   multiple unstageable pressure ulcers   BMI:  Body mass index is 17.52 kg/(m^2).  Estimated Nutritional Needs:   Kcal:  BEE 865 kcals (IF 1.1-1.3, AF 1.2) 1610-9604 kcals/d  Protein:  (1.2-1.5 g/kg) 43-54 g/d  Fluid:  (25-45ml//kg) 900-1034ml/d  EDUCATION NEEDS:   No education needs identified at this time   MODERATE Care Level  Leda Quail, RD, LDN Pager [phone] Weekend/On-Call Pager [phone]

## 2015-09-10 ENCOUNTER — Inpatient Hospital Stay: Payer: BLUE CROSS/BLUE SHIELD

## 2015-09-10 DIAGNOSIS — N390 Urinary tract infection, site not specified: Secondary | ICD-10-CM

## 2015-09-10 DIAGNOSIS — E43 Unspecified severe protein-calorie malnutrition: Secondary | ICD-10-CM

## 2015-09-10 DIAGNOSIS — K648 Other hemorrhoids: Secondary | ICD-10-CM | POA: Insufficient documentation

## 2015-09-10 LAB — BASIC METABOLIC PANEL
Anion gap: 16 — ABNORMAL HIGH (ref 5–15)
BUN: 9 mg/dL (ref 6–20)
CHLORIDE: 110 mmol/L (ref 101–111)
CO2: 16 mmol/L — AB (ref 22–32)
CREATININE: 0.38 mg/dL — AB (ref 0.44–1.00)
Calcium: 7.2 mg/dL — ABNORMAL LOW (ref 8.9–10.3)
GFR calc Af Amer: >60 mL/min (ref >60)
GFR calc non Af Amer: >60 mL/min (ref >60)
GLUCOSE: 77 mg/dL (ref 65–99)
Potassium: 4 mmol/L (ref 3.5–5.1)
Sodium: 142 mmol/L (ref 135–145)

## 2015-09-10 LAB — URINE CULTURE: SPECIAL REQUESTS: NORMAL

## 2015-09-10 LAB — PHOSPHORUS: Phosphorus: 2.2 mg/dL — ABNORMAL LOW (ref 2.5–4.6)

## 2015-09-10 LAB — MAGNESIUM: Magnesium: 1.8 mg/dL (ref 1.7–2.4)

## 2015-09-10 MED ORDER — PHENYLEPHRINE IN HARD FAT 0.25 % RE SUPP
1.0000 | Freq: Two times a day (BID) | RECTAL | Status: DC
  Administered 2015-09-10 – 2015-09-23 (×21): 1 via RECTAL
  Filled 2015-09-10 (×32): qty 1

## 2015-09-10 MED ORDER — SODIUM CHLORIDE 0.9 % IV SOLN
200.0000 mg | INTRAVENOUS | Status: DC
  Filled 2015-09-10: qty 4

## 2015-09-10 MED ORDER — LINEZOLID 600 MG PO TABS: 600.0000 mg | ORAL_TABLET | Freq: Two times a day (BID) | ORAL | Status: DC

## 2015-09-10 MED ORDER — LINEZOLID 600 MG PO TABS
600.0000 mg | ORAL_TABLET | Freq: Two times a day (BID) | ORAL | Status: DC
  Filled 2015-09-10 (×2): qty 1

## 2015-09-10 MED ORDER — VANCOMYCIN 50 MG/ML ORAL SOLUTION
125.0000 mg | Freq: Four times a day (QID) | ORAL | Status: DC
  Administered 2015-09-10 – 2015-09-23 (×48): 125 mg via ORAL
  Filled 2015-09-10 (×60): qty 2.5

## 2015-09-10 MED ORDER — MAGNESIUM SULFATE 2 GM/50ML IV SOLN
2.0000 g | Freq: Once | INTRAVENOUS | Status: AC
  Administered 2015-09-10: 2 g via INTRAVENOUS
  Filled 2015-09-10 (×2): qty 50

## 2015-09-10 MED ORDER — LINEZOLID 600 MG PO TABS
600.0000 mg | ORAL_TABLET | Freq: Two times a day (BID) | ORAL | Status: DC
  Filled 2015-09-10: qty 1

## 2015-09-10 MED ORDER — LINEZOLID 600 MG PO TABS
600.0000 mg | ORAL_TABLET | Freq: Two times a day (BID) | ORAL | Status: DC
  Administered 2015-09-10: 600 mg via ORAL
  Filled 2015-09-10: qty 1

## 2015-09-10 NOTE — Progress Notes (Signed)
[facility] CLINIC INFECTIOUS DISEASE PROGRESS NOTE Date of Admission:  09/04/2015     ID: Jennifer [name] is a 55 y.o. female with recurrent UTI  Active Problems:   UTI (lower urinary tract infection)   Pressure ulcer   Protein-calorie malnutrition, severe  Subjective: C diff +, No further fevers. Low sats   ROS  Eleven systems are reviewed and negative except per hpi  Medications:  Antibiotics Given (last 72 hours)    Date/Time Action Medication Dose Rate   09/07/15 1928 Given   DAPTOmycin (CUBICIN) 200 mg in sodium chloride 0.9 % IVPB 200 mg 208 mL/hr   09/07/15 2209 Given   meropenem (MERREM) 1 g in sodium chloride 0.9 % 100 mL IVPB 1 g 200 mL/hr   09/08/15 1017 Given   meropenem (MERREM) 1 g in sodium chloride 0.9 % 100 mL IVPB 1 g 200 mL/hr   09/08/15 1754 Given   DAPTOmycin (CUBICIN) 200 mg in sodium chloride 0.9 % IVPB 200 mg 208 mL/hr   09/08/15 2310 Given   meropenem (MERREM) 1 g in sodium chloride 0.9 % 100 mL IVPB 1 g 200 mL/hr   09/09/15 1758 Given   DAPTOmycin (CUBICIN) 200 mg in sodium chloride 0.9 % IVPB 200 mg 208 mL/hr   09/09/15 2122 Given   metroNIDAZOLE (FLAGYL) IVPB 500 mg 500 mg 100 mL/hr   09/10/15 0319 Given   metroNIDAZOLE (FLAGYL) IVPB 500 mg 500 mg 100 mL/hr   09/10/15 1248 Given   metroNIDAZOLE (FLAGYL) IVPB 500 mg 500 mg 100 mL/hr     . buPROPion  300 mg Oral BID  . DAPTOmycin (CUBICIN)  IV  200 mg Intravenous Q24H  . Dextromethorphan-Quinidine  1 capsule Oral BID  . enoxaparin (LOVENOX) injection  30 mg Subcutaneous Q24H  . levETIRAcetam  500 mg Intravenous Q12H  . magnesium sulfate 1 - 4 g bolus IVPB  2 g Intravenous Once  . metronidazole  500 mg Intravenous Q8H  . tiZANidine  2 mg Oral QHS    Objective: Vital signs in last 24 hours: Temp:  [99.4 F (37.4 C)-100 F (37.8 C)] 99.7 F (37.6 C) (02/14 0826) Pulse Rate:  [97-109] 103 (02/14 0826) Resp:  [17-48] 20 (02/14 0826) BP: (112-126)/(75-90) 124/76 mmHg (02/14 0826) SpO2:  [90  %-93 %] 93 % (02/14 0826) Weight:  [38.737 kg (85 lb 6.4 oz)] 38.737 kg (85 lb 6.4 oz) (02/14 0500) GENERAL: curled on side. Thin EYES: Pupils equal, round, reactive to light and accommodation. No scleral icterus.  HEENT: Head atraumatic, normocephalic. No oropharyngeal erythema, moist oral dry NECK: Supple, no jugular venous distention. No thyroid enlargement, no tenderness.  LUNGS: Normal breath sounds bilaterally, no wheezing, rales, rhonchi. No use of accessory muscles of respiration.  CARDIOVASCULAR: S1, S2 normal.  ABDOMEN: Soft, nontender, nondistended. Bowel sounds present. No organomegaly or mass.  EXTREMITIES: No pedal edema, cyanosis, or clubbing. + 2 pedal & radial pulses b/l. Diffuse muscle aches NEUROLOGIC: Slumped to the right side. Does not follow commands. PSYCHIATRIC: The patient is drowsy. SKIN: No obvious rash, lesion, or ulcer. Foley in place with concentrated urine   Lab Results  Recent Labs  09/08/15 0457 09/09/15 0427 09/09/15 1839 09/10/15 0618  WBC 14.8*  --   --   --   HGB 9.0*  --   --   --   HCT 27.0*  --   --   --   NA 142 145  --  142  K 4.0 3.9 4.0 4.0  CL  118* 120*  --  110  CO2 19* 22  --  16*  BUN 15 13  --  9  CREATININE 0.48 0.37*  --  0.38*    Microbiology: Results for orders placed or performed during the hospital encounter of 09/04/15  Urine culture     Status: None   Collection Time: 09/04/15  9:01 PM  Result Value Ref Range Status   Specimen Description URINE, RANDOM  Final   Special Requests NONE  Final   Culture MULTIPLE SPECIES PRESENT, SUGGEST RECOLLECTION  Final   Report Status 09/06/2015 FINAL  Final  Blood Culture (routine x 2)     Status: None   Collection Time: 09/04/15  9:41 PM  Result Value Ref Range Status   Specimen Description BLOOD RIGHT ARM  Final   Special Requests BOTTLES DRAWN AEROBIC AND ANAEROBIC  Final   Culture NO GROWTH 5 DAYS  Final   Report Status 09/09/2015 FINAL  Final  Blood Culture  (routine x 2)     Status: None   Collection Time: 09/04/15  9:41 PM  Result Value Ref Range Status   Specimen Description BLOOD LEFT ARM  Final   Special Requests BOTTLES DRAWN AEROBIC AND ANAEROBIC  Final   Culture NO GROWTH 5 DAYS  Final   Report Status 09/09/2015 FINAL  Final  Urine culture     Status: None   Collection Time: 09/06/15  4:58 PM  Result Value Ref Range Status   Specimen Description URINE, CATHETERIZED  Final   Special Requests ertapenem and vanco Normal  Final   Culture   Final    >=100,000 COLONIES/mL ENTEROCOCCUS FAECIUM WITH MIXED BACTERIAL ORGANISMS VRE HAVE INTRINSIC RESISTANCE TO MOST COMMONLY USED ANTIBIOTICS AND THE ABILITY TO ACQUIRE RESISTANCE TO MOST AVAILABLE ANTIBIOTICS.    Report Status 09/10/2015 FINAL  Final   Organism ID, Bacteria ENTEROCOCCUS FAECIUM  Final      Susceptibility   Enterococcus faecium - MIC*    AMPICILLIN >=32 RESISTANT Resistant     LEVOFLOXACIN >=8 RESISTANT Resistant     NITROFURANTOIN 128 RESISTANT Resistant     VANCOMYCIN >=32 RESISTANT Resistant     LINEZOLID 2 SENSITIVE Sensitive     * >=100,000 COLONIES/mL ENTEROCOCCUS FAECIUM  C difficile quick scan w PCR reflex     Status: Abnormal   Collection Time: 09/09/15  4:14 PM  Result Value Ref Range Status   C Diff antigen POSITIVE (A) NEGATIVE Final   C Diff toxin NEGATIVE NEGATIVE Final   C Diff interpretation   Final    Positive for toxigenic C. difficile, active toxin production not detected. Patient has toxigenic C. difficile organisms present in the bowel, but toxin was not detected. The patient may be a carrier or the level of toxin in the sample was below the limit  of detection. This information should be used in conjunction with the patient's clinical history when deciding on possible therapy.     Comment: CRITICAL RESULT CALLED TO, READ BACK BY AND VERIFIED WITH: Burnett Corrente ON 09/09/15 AT 2019 BY TLB   Clostridium Difficile by PCR     Status: Abnormal    Collection Time: 09/09/15  4:14 PM  Result Value Ref Range Status   Toxigenic C Difficile by pcr POSITIVE (A) NEGATIVE Final    Comment: CRITICAL RESULT CALLED TO, READ BACK BY AND VERIFIED WITH: Burnett Corrente ON 09/09/15 AT 2019 BY TLB     Studies/Results: Dg Chest 1 View  09/10/2015  CLINICAL DATA:  Hypoxia EXAM: CHEST 1 VIEW COMPARISON:  September 04, 2015 FINDINGS: The degree of inspiration is shallow. There is focal airspace consolidation in the right base. There is mild atelectasis in both lower lung zones. Heart is upper normal in size with pulmonary vascularity within normal limits. No adenopathy. No bone lesions. IMPRESSION: Right lower lobe airspace consolidation. Note shallow degree of inspiration. No change in cardiac silhouette. Electronically Signed   By: Bretta Bang III M.D.   On: 09/10/2015 13:44    Assessment/Plan: Jennifer [name] is a 55 y.o. 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 Prior cxs with VRE and Klebsiella - Admit cx mixed.  Foley changed this admission - she was due to have suprapubic cath placed but declined. Has not been on suppressive abx, gets foley changed q 2 weeks at home.  She had CT scan in June to look for other reasons for recurrent UTI . Flu negative Her fever spiked again so changed to dapto and meropenem and improving some.  Repeat UCX with > 100 K VRE.  C diff + Having some low sats currently  Recommendations Change to linezolid for VRE in urine. This will cover gram + in lung as well. C diff test + - oral vanco Thank you very much for the consult. Will follow with you.  Jennifer [name]   09/10/2015, 2:13 PM

## 2015-09-10 NOTE — Progress Notes (Signed)
Pharmacy Antibiotic Note  Jennifer [name] is a 55 y.o. female admitted on 09/04/2015 with UTI/sepsis.  Pharmacy has been consulted for daptomycin per ID for continued fevers after broad-spectrum therapy and a history of VRE UTIs. Patient with urine culture growing > 100K GPC - likely VRE.   Plan:  Continue daptomycin  IV q 24hrs. Will continue to follow for signs of infection resolution.  UA: LE(+) NO2(-) WBC TNTC Blood and urine cx pending CXR: Clear  Height:  (149.9 cm) Weight: 85 lb 6.4 oz (38.737 kg) IBW/kg (Calculated) : 43.2  Temp (24hrs), Avg:99.7 F (37.6 C), Min:99.4 F (37.4 C), Max:100 F (37.8 C)   Recent Labs Lab 09/04/15 2101 09/05/15 0519  09/06/15 0513 09/07/15 0444 09/08/15 0457 09/09/15 0427 09/10/15 0618  WBC 11.3* 11.7*  --  15.2*  --  14.8*  --   --   CREATININE 0.69 0.48  < > 0.58 0.55 0.48 0.37* 0.38*  LATICACIDVEN 2.1* 0.8  --   --   --   --   --   --   < > = values in this interval not displayed.  Estimated Creatinine Clearance: 49.1 mL/min (by C-G formula based on Cr of 0.38).    Allergies  Allergen Reactions  . Bee Venom Anaphylaxis  . Hydrocodone Other (See Comments)    Reaction:  Muscle stiffness   . Lipitor [Atorvastatin] Other (See Comments)    Reaction:  Muscle cramps   . Shellfish Allergy Anaphylaxis  . Oxycodone Other (See Comments)    Reaction:  Muscle stiffness   . Prednisone Other (See Comments)    Reaction:  Psychosis   . Vicodin [Hydrocodone-Acetaminophen] Other (See Comments)    Reaction:  Muscle stiffness   . Oxycontin [Oxycodone Hcl] Other (See Comments)    Reaction:  Muscle stiffness   . Peanut-Containing Drug Products Diarrhea        Pharmacy will continue to monitor and adjust per consult.   Simpson,Michael L 09/10/2015 8:45 AM

## 2015-09-10 NOTE — Consult Note (Signed)
Surgical Consultation  09/10/2015  Jennifer [name] is an 55 y.o. female.   CC:" Rectal prolapse"  HPI this patient with multiple medical problems most importantly ALS with bedridden state who I was asked see for rectal prolapse I am unable to obtain any sort of history from the patient nor can I perform a review of systems due to her neurologic condition. He does suggest that she is not having pain but is never had this happen before  Past Medical History  Diagnosis Date  . ALS (amyotrophic lateral sclerosis) ([place])   . Depression   . GERD (gastroesophageal reflux disease)   . Urinary retention   . Dysphagia, oropharyngeal 11/28/2013  . CN (constipation) 11/28/2013    Past Surgical History  Procedure Laterality Date  . Cesarean section    . Nasal sinus surgery      Family History  Problem Relation Age of Onset  . COPD Mother   . Thyroid disease Mother   . Diverticulosis Mother   . Heart attack Mother   . Gout Father   . Depression Sister   . Thyroid disease Sister   . Cancer Brother     kidney  . Heart attack Maternal Grandmother   . Heart attack Maternal Grandfather   . Heart attack Paternal Grandfather   . Hyperlipidemia Sister   . Yves Dill Parkinson White syndrome Sister 25  . Depression Brother   . Hypertension Brother     Social History:  reports that she has quit smoking. She has never used smokeless tobacco. She reports that she does not drink alcohol or use illicit drugs.  Allergies:  Allergies  Allergen Reactions  . Bee Venom Anaphylaxis  . Hydrocodone Other (See Comments)    Reaction:  Muscle stiffness   . Lipitor [Atorvastatin] Other (See Comments)    Reaction:  Muscle cramps   . Shellfish Allergy Anaphylaxis  . Oxycodone Other (See Comments)    Reaction:  Muscle stiffness   . Prednisone Other (See Comments)    Reaction:  Psychosis   . Vicodin [Hydrocodone-Acetaminophen] Other (See Comments)    Reaction:  Muscle stiffness   . Oxycontin [Oxycodone Hcl]  Other (See Comments)    Reaction:  Muscle stiffness   . Peanut-Containing Drug Products Diarrhea    Medications reviewed.   Review of Systems:   Review of Systems  Unable to perform ROS: acuity of condition     Physical Exam:  BP 122/72 mmHg  Pulse 98  Temp(Src) 100.1 F (37.8 C) (Axillary)  Resp 20  Ht _0  (1.499 m)  Wt 85 lb 6.4 oz (38.737 kg)  BMI 17.24 kg/m2  SpO2 91%  Physical Exam  Constitutional:  Cachectic female patient who is nonverbal for the most part and whose joints and legs etc. are contractured  Abdominal: Soft. She exhibits no distension. There is no tenderness. There is no rebound and no guarding.  Genitourinary:  Rectal inspection demonstrates grade 3 edematous hemorrhoids with no blood and diarrheal stool surrounding it no obvious prolapse  Musculoskeletal:  Contractures, cachectic with muscle wasting      Results for orders placed or performed during the hospital encounter of 09/04/15 (from the past 48 hour(s))  Basic metabolic panel     Status: Abnormal   Collection Time: 09/09/15  4:27 AM  Result Value Ref Range   Sodium 145 135 - 145 mmol/L   Potassium 3.9 3.5 - 5.1 mmol/L   Chloride 120 (H) 101 - 111 mmol/L   CO2 22 22 -  32 mmol/L   Glucose, Bld 120 (H) 65 - 99 mg/dL   BUN 13 6 - 20 mg/dL   Creatinine, Ser 0.37 (L) 0.44 - 1.00 mg/dL   Calcium 8.0 (L) 8.9 - 10.3 mg/dL   GFR calc non Af Amer >60 >60 mL/min   GFR calc Af Amer >60 >60 mL/min    Comment: (NOTE) The eGFR has been calculated using the CKD EPI equation. This calculation has not been validated in all clinical situations. eGFR's persistently <60 mL/min signify possible Chronic Kidney Disease.    Anion gap 3 (L) 5 - 15  Magnesium     Status: None   Collection Time: 09/09/15  4:27 AM  Result Value Ref Range   Magnesium 2.1 1.7 - 2.4 mg/dL  Phosphorus     Status: Abnormal   Collection Time: 09/09/15  4:27 AM  Result Value Ref Range   Phosphorus 1.1 (L) 2.5 - 4.6 mg/dL   C difficile quick scan w PCR reflex     Status: Abnormal   Collection Time: 09/09/15  4:14 PM  Result Value Ref Range   C Diff antigen POSITIVE (A) NEGATIVE   C Diff toxin NEGATIVE NEGATIVE   C Diff interpretation      Positive for toxigenic C. difficile, active toxin production not detected. Patient has toxigenic C. difficile organisms present in the bowel, but toxin was not detected. The patient may be a carrier or the level of toxin in the sample was below the limit  of detection. This information should be used in conjunction with the patient's clinical history when deciding on possible therapy.     Comment: CRITICAL RESULT CALLED TO, READ BACK BY AND VERIFIED WITH: Pryor Curia ON 09/09/15 AT 2019 BY TLB   Clostridium Difficile by PCR     Status: Abnormal   Collection Time: 09/09/15  4:14 PM  Result Value Ref Range   Toxigenic C Difficile by pcr POSITIVE (A) NEGATIVE    Comment: CRITICAL RESULT CALLED TO, READ BACK BY AND VERIFIED WITH: Pryor Curia ON 09/09/15 AT 2019 BY TLB   Potassium     Status: None   Collection Time: 09/09/15  6:39 PM  Result Value Ref Range   Potassium 4.0 3.5 - 5.1 mmol/L  Phosphorus     Status: Abnormal   Collection Time: 09/09/15  6:39 PM  Result Value Ref Range   Phosphorus 2.4 (L) 2.5 - 4.6 mg/dL  Basic metabolic panel     Status: Abnormal   Collection Time: 09/10/15  6:18 AM  Result Value Ref Range   Sodium 142 135 - 145 mmol/L   Potassium 4.0 3.5 - 5.1 mmol/L   Chloride 110 101 - 111 mmol/L   CO2 16 (L) 22 - 32 mmol/L   Glucose, Bld 77 65 - 99 mg/dL   BUN 9 6 - 20 mg/dL   Creatinine, Ser 0.38 (L) 0.44 - 1.00 mg/dL   Calcium 7.2 (L) 8.9 - 10.3 mg/dL   GFR calc non Af Amer >60 >60 mL/min   GFR calc Af Amer >60 >60 mL/min    Comment: (NOTE) The eGFR has been calculated using the CKD EPI equation. This calculation has not been validated in all clinical situations. eGFR's persistently <60 mL/min signify possible Chronic Kidney Disease.     Anion gap 16 (H) 5 - 15  Phosphorus     Status: Abnormal   Collection Time: 09/10/15  6:18 AM  Result Value Ref Range   Phosphorus 2.2 (L) 2.5 -  4.6 mg/dL  Magnesium     Status: None   Collection Time: 09/10/15  6:18 AM  Result Value Ref Range   Magnesium 1.8 1.7 - 2.4 mg/dL   Dg Chest 1 View  09/10/2015  CLINICAL DATA:  Hypoxia EXAM: CHEST 1 VIEW COMPARISON:  September 04, 2015 FINDINGS: The degree of inspiration is shallow. There is focal airspace consolidation in the right base. There is mild atelectasis in both lower lung zones. Heart is upper normal in size with pulmonary vascularity within normal limits. No adenopathy. No bone lesions. IMPRESSION: Right lower lobe airspace consolidation. Note shallow degree of inspiration. No change in cardiac silhouette. Electronically Signed   By: Lowella Grip III M.D.   On: 09/10/2015 13:44    Assessment/Plan:  Rate 3 hemorrhoids with edema no obvious prolapse unfortunately the patient is allergic to prednisone therefore we cannot use Actifoam HC but I will order Preparation H without hydrocortisone. No surgical options in this patient at this time  Florene Glen, MD, FACS

## 2015-09-10 NOTE — Progress Notes (Signed)
[facility] [place] at [facility]   PATIENT NAME: Jennifer [name]    MR#:  [id number]  DATE OF BIRTH:  [birth year]  SUBJECTIVE:  CHIEF COMPLAINT:   Chief Complaint  Patient presents with  . Altered Mental Status   - Patient more alert and close to her baseline.   Not sure if she is in pain. -Caregiver at bedside and states patient is almost back to her baseline. -no fever in last 2 day - had diarrhea, cdiff positive. - Have hypoxia today, so now requiring supplemental oxygen.  REVIEW OF SYSTEMS:  Review of Systems  Unable to perform ROS: mental acuity    DRUG ALLERGIES:   Allergies  Allergen Reactions  . Bee Venom Anaphylaxis  . Hydrocodone Other (See Comments)    Reaction:  Muscle stiffness   . Lipitor [Atorvastatin] Other (See Comments)    Reaction:  Muscle cramps   . Shellfish Allergy Anaphylaxis  . Oxycodone Other (See Comments)    Reaction:  Muscle stiffness   . Prednisone Other (See Comments)    Reaction:  Psychosis   . Vicodin [Hydrocodone-Acetaminophen] Other (See Comments)    Reaction:  Muscle stiffness   . Oxycontin [Oxycodone Hcl] Other (See Comments)    Reaction:  Muscle stiffness   . Peanut-Containing Drug Products Diarrhea    VITALS:  Blood pressure 140/60, pulse 100, temperature 100 F (37.8 C), temperature source Oral, resp. rate 30, height  (1.499 m), weight 38.737 kg (85 lb 6.4 oz), SpO2 92 %.  PHYSICAL EXAMINATION:  Physical Exam  GENERAL:  55 y.o.-year-old patient lying in the bed with no acute distress. Patient has ALS. Appears contracted in bed EYES: Pupils equal, round, reactive to light and accommodation. No scleral icterus. Extraocular muscles intact.  HEENT: Head atraumatic, normocephalic. Oropharynx and nasopharynx clear.  NECK:  Supple, no jugular venous distention. No thyroid enlargement, no tenderness.  LUNGS: Normal breath sounds bilaterally, no wheezing, some crepitation. No use of accessory  muscles of respiration. Decreased bibasilar breath sounds,using [place] oxygen. CARDIOVASCULAR: S1, S2 normal. No murmurs, rubs, or gallops.  ABDOMEN: Soft, nontender, nondistended. Bowel sounds present. No organomegaly or mass.  EXTREMITIES: No pedal edema, cyanosis, or clubbing.  NEUROLOGIC: Lying in bed, contracted appearing, mumbling- speech.not following commands, occasionally tearful PSYCHIATRIC: The patient is more alert and following some simple commands. Appears weak overall. SKIN: No obvious rash, lesion, or ulcer.    LABORATORY PANEL:   CBC  Recent Labs Lab 09/08/15 0457  WBC 14.8*  HGB 9.0*  HCT 27.0*  PLT 266   ------------------------------------------------------------------------------------------------------------------  Chemistries   Recent Labs Lab 09/04/15 2101  09/10/15 0618  NA 136  < > 142  K 3.1*  < > 4.0  CL 100*  < > 110  CO2 26  < > 16*  GLUCOSE 82  < > 77  BUN 15  < > 9  CREATININE 0.69  < > 0.38*  CALCIUM 9.0  < > 7.2*  MG  --   < > 1.8  AST 20  --   --   ALT 11*  --   --   ALKPHOS 51  --   --   BILITOT 0.9  --   --   < > = values in this interval not displayed. ------------------------------------------------------------------------------------------------------------------  Cardiac Enzymes  Recent Labs Lab 09/04/15 2101  TROPONINI 0.03   ------------------------------------------------------------------------------------------------------------------  RADIOLOGY:  Dg Chest 1 View  09/10/2015  CLINICAL DATA:  Hypoxia EXAM: CHEST 1 VIEW COMPARISON:  September 04, 2015 FINDINGS: The degree of inspiration is shallow. There is focal airspace consolidation in the right base. There is mild atelectasis in both lower lung zones. Heart is upper normal in size with pulmonary vascularity within normal limits. No adenopathy. No bone lesions. IMPRESSION: Right lower lobe airspace consolidation. Note shallow degree of inspiration. No change in cardiac  silhouette. Electronically Signed   By: Bretta Bang III M.D.   On: 09/10/2015 13:44    EKG:   Orders placed or performed during the hospital encounter of 09/04/15  . ED EKG 12-Lead  . ED EKG 12-Lead    ASSESSMENT AND PLAN:   55 year old female with known history of ALS with chronic Foley catheter and prior history of multiple UTIs, depression, GERD was brought in secondary to altered mental status.  #1 Metabolic encephalopathy-secondary to sepsis -CT of the head not done on admission.  -Improving mental status.  #2 Sepsis- UTI and pneumonia now. -ID consult appreciated.  - risk of multidrug resistant organisms in urine, antibiotics have been changed to daptomycin and meropenem.  - Influenza test is negative. - ID changed to Linezolid and oral vanc to cover for VRE UTI, Pneumonia and C diff.   #3 hypokalemia-known history of chronic hypokalemia and takes potassium supplements at home. Replaced as needed now  #4 seizure disorder-continue Keppra  this time.  #5 ALS-following with neurologist at [facility]. -Bed bound at baseline, but still able to sit with assistance and feed herself and alert and interactive. -Continue to monitor as patient is not close to her baseline. Also takes antidepressants and muscle relaxants for the same. Restart now as more alert and able to take oral meds  #6 Dysphagia- appreciate speech therapy consult. Started on dysphagia diet  #7 Severe Malnutrition- dietary consulted Dietary supplements recommended  #7 DVT prophylaxis-on Lovenox  #8 C diff- oral vanc.  #9 Pneumonia, requiring oxygen supplementation now.   Linezolid for now.   All the records are reviewed and case discussed with Care Management/Social Workerr. Management plans discussed with the patient, family and they are in agreement.  CODE STATUS: Full Code  TOTAL TIME TAKING CARE OF THIS PATIENT: 35 minutes.  Spoke to her husband on phone. POSSIBLE D/C IN 1-2 DAYS, DEPENDING ON  CLINICAL CONDITION.   Altamese Dilling M.D on 09/10/2015 at 8:47 PM  Between 7am to 6pm - Pager - [phone]  After 6pm go to [email or web address] - password [email or web address] [facility]  [place] [facility]  Office  [phone]  CC: Primary care physician; Ruthe Mannan, MD

## 2015-09-10 NOTE — Progress Notes (Signed)
Paged Dr Marc Morgans. Informed her of pt having increased work of breathing with tachynea 52 rpm. And some crackles on the right side. Low grade temp.  Informed that I have given valium and zofran for comfort. Pt still seems in distress. resp has seen pt and they have placed her on venti mask at 55% for oxygenation. Dr Marc Morgans to see pt. Will continue to monitor closly

## 2015-09-10 NOTE — Progress Notes (Signed)
Dr Marc Morgans saw pt at 2215. Pt was more comfortable at this point. Still tachypnic but rr was around 35. But appeared to be sleeping. No new orders at this time. Remains on venti mask.

## 2015-09-10 NOTE — Progress Notes (Addendum)
MEDICATION RELATED CONSULT NOTE - follow up  Pharmacy Consult for Electrolytes Indication: electrolyte   Allergies  Allergen Reactions  . Bee Venom Anaphylaxis  . Hydrocodone Other (See Comments)    Reaction:  Muscle stiffness   . Lipitor [Atorvastatin] Other (See Comments)    Reaction:  Muscle cramps   . Shellfish Allergy Anaphylaxis  . Oxycodone Other (See Comments)    Reaction:  Muscle stiffness   . Prednisone Other (See Comments)    Reaction:  Psychosis   . Vicodin [Hydrocodone-Acetaminophen] Other (See Comments)    Reaction:  Muscle stiffness   . Oxycontin [Oxycodone Hcl] Other (See Comments)    Reaction:  Muscle stiffness   . Peanut-Containing Drug Products Diarrhea    Patient Measurements: Height:  (149.9 cm) Weight: 85 lb 6.4 oz (38.737 kg) IBW/kg (Calculated) : 43.2 Adjusted Body Weight:   Vital Signs: Temp: 99.7 F (37.6 C) (02/14 0826) Temp Source: Oral (02/14 0826) BP: 124/76 mmHg (02/14 0826) Pulse Rate: 103 (02/14 0826) Intake/Output from previous day: 02/13 0701 - 02/14 0700 In: 225  Out: 275 [Urine:275] Intake/Output from this shift:    Labs:  Recent Labs  09/08/15 0457 09/09/15 0427 09/09/15 1839 09/10/15 0618  WBC 14.8*  --   --   --   HGB 9.0*  --   --   --   HCT 27.0*  --   --   --   PLT 266  --   --   --   CREATININE 0.48 0.37*  --  0.38*  MG 1.8 2.1  --  1.8  PHOS  --  1.1* 2.4* 2.2*   Lab Results  Component Value Date   K 4.0 09/10/2015   Lab Results  Component Value Date   CALCIUM 7.2* 09/10/2015   PHOS 2.2* 09/10/2015    Estimated Creatinine Clearance: 49.1 mL/min (by C-G formula based on Cr of 0.38).   Medical History: Past Medical History  Diagnosis Date  . ALS (amyotrophic lateral sclerosis) (HCC)   . Depression   . GERD (gastroesophageal reflux disease)   . Urinary retention   . Dysphagia, oropharyngeal 11/28/2013  . CN (constipation) 11/28/2013    Medications:  Scheduled:  . buPROPion  300 mg Oral BID   . DAPTOmycin (CUBICIN)  IV  200 mg Intravenous Q24H  . Dextromethorphan-Quinidine  1 capsule Oral BID  . enoxaparin (LOVENOX) injection  30 mg Subcutaneous Q24H  . levETIRAcetam  500 mg Intravenous Q12H  . metronidazole  500 mg Intravenous Q8H  . tiZANidine  2 mg Oral QHS    Assessment: Pharmacy consulted to manage electrolytes in this 55 year old female. Patient received potassium phosphate 20 mmol IV x 1.    Plan:  Phos is above 2, will abstain from further supplementation at this time. Will order magnesium 2g IV x 1 to maintain magnesium level ~ 2. All other electrolytes are within normal limits. Will obtain follow-up electrolytes with am labs.   Pharmacy will continue to monitor and adjust per consult.    Breland Elders L 09/10/2015,8:42 AM

## 2015-09-11 ENCOUNTER — Inpatient Hospital Stay: Payer: BLUE CROSS/BLUE SHIELD

## 2015-09-11 DIAGNOSIS — J9620 Acute and chronic respiratory failure, unspecified whether with hypoxia or hypercapnia: Secondary | ICD-10-CM

## 2015-09-11 DIAGNOSIS — F3289 Other specified depressive episodes: Secondary | ICD-10-CM | POA: Diagnosis not present

## 2015-09-11 DIAGNOSIS — J9600 Acute respiratory failure, unspecified whether with hypoxia or hypercapnia: Secondary | ICD-10-CM | POA: Insufficient documentation

## 2015-09-11 DIAGNOSIS — Z87891 Personal history of nicotine dependence: Secondary | ICD-10-CM

## 2015-09-11 DIAGNOSIS — Z87440 Personal history of urinary (tract) infections: Secondary | ICD-10-CM

## 2015-09-11 DIAGNOSIS — J9690 Respiratory failure, unspecified, unspecified whether with hypoxia or hypercapnia: Secondary | ICD-10-CM | POA: Insufficient documentation

## 2015-09-11 DIAGNOSIS — R0902 Hypoxemia: Secondary | ICD-10-CM

## 2015-09-11 DIAGNOSIS — R400 Somnolence: Secondary | ICD-10-CM

## 2015-09-11 DIAGNOSIS — J9601 Acute respiratory failure with hypoxia: Secondary | ICD-10-CM

## 2015-09-11 DIAGNOSIS — G1221 Amyotrophic lateral sclerosis: Secondary | ICD-10-CM | POA: Diagnosis not present

## 2015-09-11 DIAGNOSIS — L8990 Pressure ulcer of unspecified site, unspecified stage: Secondary | ICD-10-CM

## 2015-09-11 DIAGNOSIS — J189 Pneumonia, unspecified organism: Secondary | ICD-10-CM

## 2015-09-11 DIAGNOSIS — A419 Sepsis, unspecified organism: Secondary | ICD-10-CM | POA: Insufficient documentation

## 2015-09-11 DIAGNOSIS — R0689 Other abnormalities of breathing: Secondary | ICD-10-CM | POA: Insufficient documentation

## 2015-09-11 LAB — CBC
HEMATOCRIT: 32.6 % — AB (ref 35.0–47.0)
HEMOGLOBIN: 11 g/dL — AB (ref 12.0–16.0)
MCH: 31.3 pg (ref 26.0–34.0)
MCHC: 33.8 g/dL (ref 32.0–36.0)
MCV: 92.5 fL (ref 80.0–100.0)
Platelets: 410 10*3/uL (ref 150–440)
RBC: 3.53 MIL/uL — ABNORMAL LOW (ref 3.80–5.20)
RDW: 15.5 % — AB (ref 11.5–14.5)
WBC: 23.7 10*3/uL — AB (ref 3.6–11.0)

## 2015-09-11 LAB — BLOOD GAS, ARTERIAL
ACID-BASE DEFICIT: 1.3 mmol/L (ref 0.0–2.0)
Acid-base deficit: 0.7 mmol/L (ref 0.0–2.0)
Allens test (pass/fail): POSITIVE — AB
Allens test (pass/fail): POSITIVE — AB
BICARBONATE: 24.3 meq/L (ref 21.0–28.0)
Bicarbonate: 23.1 mEq/L (ref 21.0–28.0)
Delivery systems: POSITIVE
Expiratory PAP: 5
FIO2: 0.85
FIO2: 0.65
Inspiratory PAP: 14
O2 Saturation: 93.7 %
O2 Saturation: 92.2 %
PEEP/CPAP: 5 cmH2O
PH ART: 7.36 (ref 7.350–7.450)
Patient temperature: 37
Patient temperature: 37
RATE: 12 resp/min
RATE: 15 resp/min
VT: 400 mL
pCO2 arterial: 34 mmHg (ref 32.0–48.0)
pCO2 arterial: 43 mmHg (ref 32.0–48.0)
pH, Arterial: 7.44 (ref 7.350–7.450)
pO2, Arterial: 67 mmHg — ABNORMAL LOW (ref 83.0–108.0)
pO2, Arterial: 67 mmHg — ABNORMAL LOW (ref 83.0–108.0)

## 2015-09-11 LAB — MRSA PCR SCREENING: MRSA by PCR: NEGATIVE

## 2015-09-11 LAB — BASIC METABOLIC PANEL
ANION GAP: 4 — AB (ref 5–15)
BUN: 10 mg/dL (ref 6–20)
CHLORIDE: 114 mmol/L — AB (ref 101–111)
CO2: 24 mmol/L (ref 22–32)
Calcium: 7.6 mg/dL — ABNORMAL LOW (ref 8.9–10.3)
Creatinine, Ser: 0.3 mg/dL — ABNORMAL LOW (ref 0.44–1.00)
GFR calc non Af Amer: >60 mL/min (ref >60)
Glucose, Bld: 82 mg/dL (ref 65–99)
Potassium: 4.1 mmol/L (ref 3.5–5.1)
Sodium: 142 mmol/L (ref 135–145)

## 2015-09-11 LAB — MAGNESIUM: Magnesium: 2.1 mg/dL (ref 1.7–2.4)

## 2015-09-11 LAB — PHOSPHORUS: PHOSPHORUS: 2.9 mg/dL (ref 2.5–4.6)

## 2015-09-11 MED ORDER — FENTANYL CITRATE (PF) 100 MCG/2ML IJ SOLN
100.0000 ug | INTRAMUSCULAR | Status: DC | PRN
  Administered 2015-09-11 – 2015-09-12 (×2): 100 ug via INTRAVENOUS
  Administered 2015-09-14: 25 ug via INTRAVENOUS
  Administered 2015-09-15 – 2015-09-16 (×4): 100 ug via INTRAVENOUS
  Filled 2015-09-11 (×5): qty 2

## 2015-09-11 MED ORDER — VECURONIUM BROMIDE 10 MG IV SOLR
10.0000 mg | Freq: Once | INTRAVENOUS | Status: AC
  Administered 2015-09-11: 10 mg via INTRAVENOUS

## 2015-09-11 MED ORDER — MIDAZOLAM HCL 2 MG/2ML IJ SOLN: 4.0000 mg | Freq: Once | INTRAMUSCULAR | Status: DC

## 2015-09-11 MED ORDER — FENTANYL CITRATE (PF) 100 MCG/2ML IJ SOLN
100.0000 ug | INTRAMUSCULAR | Status: DC | PRN
  Filled 2015-09-11 (×2): qty 2

## 2015-09-11 MED ORDER — FUROSEMIDE 10 MG/ML IJ SOLN
20.0000 mg | Freq: Once | INTRAMUSCULAR | Status: AC
  Administered 2015-09-11: 20 mg via INTRAVENOUS
  Filled 2015-09-11: qty 2

## 2015-09-11 MED ORDER — MIDAZOLAM HCL 2 MG/2ML IJ SOLN: 2.0000 mg | INTRAMUSCULAR | Status: DC | PRN

## 2015-09-11 MED ORDER — PIPERACILLIN-TAZOBACTAM 3.375 G IVPB
3.3750 g | Freq: Three times a day (TID) | INTRAVENOUS | Status: DC
  Administered 2015-09-11 – 2015-09-16 (×15): 3.375 g via INTRAVENOUS
  Filled 2015-09-11 (×18): qty 50

## 2015-09-11 MED ORDER — FAMOTIDINE IN NACL 20-0.9 MG/50ML-% IV SOLN
20.0000 mg | Freq: Two times a day (BID) | INTRAVENOUS | Status: DC
Start: 2015-09-11 — End: 2015-09-18
  Administered 2015-09-11 – 2015-09-17 (×13): 20 mg via INTRAVENOUS
  Filled 2015-09-11 (×15): qty 50

## 2015-09-11 MED ORDER — MIDAZOLAM HCL 5 MG/5ML IJ SOLN
INTRAMUSCULAR | Status: AC
  Administered 2015-09-11: 2 mg
  Filled 2015-09-11: qty 5

## 2015-09-11 MED ORDER — MORPHINE SULFATE (PF) 2 MG/ML IV SOLN
1.0000 mg | Freq: Once | INTRAVENOUS | Status: AC
  Administered 2015-09-11: 1 mg via INTRAVENOUS
  Filled 2015-09-11: qty 1

## 2015-09-11 MED ORDER — MIDAZOLAM HCL 2 MG/2ML IJ SOLN
2.0000 mg | INTRAMUSCULAR | Status: DC | PRN
  Administered 2015-09-14 – 2015-09-17 (×3): 2 mg via INTRAVENOUS
  Filled 2015-09-11 (×3): qty 2

## 2015-09-11 MED ORDER — FENTANYL CITRATE (PF) 100 MCG/2ML IJ SOLN
100.0000 ug | Freq: Once | INTRAMUSCULAR | Status: AC
  Administered 2015-09-11: 100 ug via INTRAVENOUS

## 2015-09-11 MED ORDER — LINEZOLID 600 MG/300ML IV SOLN
600.0000 mg | Freq: Two times a day (BID) | INTRAVENOUS | Status: DC
  Administered 2015-09-11 – 2015-09-16 (×11): 600 mg via INTRAVENOUS
  Filled 2015-09-11 (×14): qty 300

## 2015-09-11 MED ORDER — DEXTROSE 5 % IV SOLN
0.0000 ug/min | INTRAVENOUS | Status: DC
  Administered 2015-09-11: 10 ug/min via INTRAVENOUS
  Administered 2015-09-12: 2 ug/min via INTRAVENOUS
  Administered 2015-09-13: 1 ug/min via INTRAVENOUS
  Filled 2015-09-11 (×2): qty 4

## 2015-09-11 MED ORDER — FENTANYL CITRATE (PF) 100 MCG/2ML IJ SOLN
INTRAMUSCULAR | Status: AC
  Administered 2015-09-11: 100 ug via INTRAVENOUS
  Filled 2015-09-11: qty 4

## 2015-09-11 NOTE — Progress Notes (Signed)
Patient breathing 40-50 times per minute. Dr. Dema Severin notified. Patient's arms are  also shaking causing artifact on the heart monitor.

## 2015-09-11 NOTE — Progress Notes (Signed)
Patient o2 stats decreased, no improvement with non rebreather. Nursing interventions taking paused IV fluids and elevated HOB. Respiratory called at bedside placed pt on BiPAP. Prime doc notified. Dr. Sheryle Hail ordered IV lasix and stat chest xray. Rapid response called. Family notified 0620  Report called and patient tx to ccu 4 at  [phone]

## 2015-09-11 NOTE — Procedures (Signed)
Date: 09/11/2015,     PHYSICIAN:  Taniyah Ballow  Indications/Preliminary Diagnosis: Right lower lobe atelectasis, sepsis, possible pneumonia  Consent: (Place X beside choice/s below)  The benefits, risks and possible complications of the procedure were        explained to:  ___ patient  _x__ patient's family  ___ other:___________  who verbalized understanding and gave:  ___ verbal  ___ written  __x_ verbal and written  ___ telephone  ___ other:________ consent.      Unable to obtain consent; procedure performed on emergent basis.     Other:       PRESEDATION ASSESSMENT: History and Physical has been performed. Patient meds and allergies have been reviewed. Presedation airway examination has been performed and documented. Baseline vital signs, sedation score, oxygenation status, and cardiac rhythm were reviewed. Patient was deemed to be in satisfactory condition to undergo the procedure.  PREMEDICATIONS:   Sedative/Narcotic Amt Dose   Versed  mg   Fentanyl  mcg  Diprivan  mg        Airway Prep (Place X beside choice below)   1% Transtracheal Lidocaine Anesthetization 7 cc   Patient prepped per Bronchoscopy Lab Policy       Insertion Route (Place X beside choice below)   Nasal   Oral   Endotracheal Tube   Tracheostomy   INTRAPROCEDURE MEDICATIONS:  Sedative/Narcotic Amt Dose   Versed  mg   Fentanyl  mcg  Diprivan  mg       Medication Amt Dose  Medication Amt Dose  Xylocaine 2%  cc  Epinephrine 1:10,000 sol  cc  Xylocaine 4%  cc  Cocaine  cc   TECHNICAL PROCEDURES: (Place X beside choice below)   Procedures  Description    None     Electrocautery     Cryotherapy     Balloon Dilatation     Bronchography     Stent Placement     Therapeutic Aspiration     Laser/Argon Plasma    Brachytherapy Catheter Placement    Foreign Body Removal     SPECIMENS (Sites): (Place X beside choice below)  Specimens Description   No Specimens Obtained     Washings   x  Lavage RML   Biopsies    Fine Needle Aspirates    Brushings    Sputum    FINDINGS: See below  ESTIMATED BLOOD LOSS: None  COMPLICATIONS/RESOLUTION: None  PROCEDURE DETAILS: Timeout performed and correct patient, name, & ID confirmed. Following prep per Pulmonary policy, appropriate sedation was administered.  Airway exam proceeded with findings, technical procedures, and specimen collection as noted below. At the end of exam the scope was withdrawn without incident. Impression and Plan as noted below.   Inspection: Inspection of right and left airways with no significant endobronchial lesions, no mucosal edema, no mucosal erythema, no foreign bodies noted, normal appearing anatomy, mild thick mucus in left mainstem, this was easily suctioned.  ET tube noted to be at the level of the carina upon insertion of bronchoscopy, ET tube retracted from 21 cm of the lip to 18 cm at the lip.  Procedure: Right middle lobe BAL - 40 mL's and still, 15 mL return, clear for the appearance of fluid    IMPLANTED DEVICE(S): None  IMPRESSION:POST-PROCEDURE DX: Atelectasis/pneumonia  RECOMMENDATION/PLAN:   Follow-up BAL cultures  ADDITIONAL COMMENTS: None   Procedure Time:10 mins   Stephanie Acre, MD [facility] Pager : [phone] (Please enter 7 digits)  On call pager [phone] (please enter 7-digits) Clinic - [phone]

## 2015-09-11 NOTE — Progress Notes (Signed)
PHARMACY - CRITICAL CARE PROGRESS NOTE  Pharmacy Consult for electrolyte management      Allergies  Allergen Reactions  . Bee Venom Anaphylaxis  . Hydrocodone Other (See Comments)    Reaction:  Muscle stiffness   . Lipitor [Atorvastatin] Other (See Comments)    Reaction:  Muscle cramps   . Shellfish Allergy Anaphylaxis  . Oxycodone Other (See Comments)    Reaction:  Muscle stiffness   . Prednisone Other (See Comments)    Reaction:  Psychosis   . Vicodin [Hydrocodone-Acetaminophen] Other (See Comments)    Reaction:  Muscle stiffness   . Oxycontin [Oxycodone Hcl] Other (See Comments)    Reaction:  Muscle stiffness   . Peanut-Containing Drug Products Diarrhea    Patient Measurements: Height:  (157.5 cm) Weight: 95 lb 0.3 oz (43.1 kg) IBW/kg (Calculated) : 50.1  Vital Signs: Temp: 97.8 F (36.6 C) (02/15 0809) Temp Source: Axillary (02/15 0809) BP: 103/68 mmHg (02/15 0700) Pulse Rate: 87 (02/15 0700) Intake/Output from previous day: 02/14 0701 - 02/15 0700 In: 400 [I.V.:400] Out: 675 [Urine:550; Stool:125] Intake/Output from this shift:   Vent settings for last 24 hours: Vent Mode:  [-] PRVC FiO2 (%):  [2 %-70 %] 70 % Set Rate:  [15 bmp] 15 bmp Vt Set:  [400 mL] 400 mL PEEP:  [5 cmH20] 5 cmH20  Labs:  Recent Labs  09/09/15 0427 09/09/15 1839 09/10/15 0618 09/11/15 0532  WBC  --   --   --  23.7*  HGB  --   --   --  11.0*  HCT  --   --   --  32.6*  PLT  --   --   --  410  CREATININE 0.37*  --  0.38* 0.30*  MG 2.1  --  1.8 2.1  PHOS 1.1* 2.4* 2.2* 2.9   Estimated Creatinine Clearance: 54.7 mL/min (by C-G formula based on Cr of 0.3).  No results for input(s): GLUCAP in the last 72 hours.  Microbiology: Recent Results (from the past 720 hour(s))  Urine culture     Status: None   Collection Time: 09/04/15  9:01 PM  Result Value Ref Range Status   Specimen Description URINE, RANDOM  Final   Special Requests NONE  Final   Culture MULTIPLE SPECIES  PRESENT, SUGGEST RECOLLECTION  Final   Report Status 09/06/2015 FINAL  Final  Blood Culture (routine x 2)     Status: None   Collection Time: 09/04/15  9:41 PM  Result Value Ref Range Status   Specimen Description BLOOD RIGHT ARM  Final   Special Requests BOTTLES DRAWN AEROBIC AND ANAEROBIC  Final   Culture NO GROWTH 5 DAYS  Final   Report Status 09/09/2015 FINAL  Final  Blood Culture (routine x 2)     Status: None   Collection Time: 09/04/15  9:41 PM  Result Value Ref Range Status   Specimen Description BLOOD LEFT ARM  Final   Special Requests BOTTLES DRAWN AEROBIC AND ANAEROBIC  Final   Culture NO GROWTH 5 DAYS  Final   Report Status 09/09/2015 FINAL  Final  Urine culture     Status: None   Collection Time: 09/06/15  4:58 PM  Result Value Ref Range Status   Specimen Description URINE, CATHETERIZED  Final   Special Requests ertapenem and vanco Normal  Final   Culture   Final    >=100,000 COLONIES/mL VANCOMYCIN RESISTANT ENTEROCOCCUS ENTEROCOCCUS FAECIUM VRE HAVE INTRINSIC RESISTANCE TO MOST COMMONLY USED  ANTIBIOTICS AND THE ABILITY TO ACQUIRE RESISTANCE TO MOST AVAILABLE ANTIBIOTICS. WITH MIXED BACTERIAL ORGANISMS    Report Status 09/10/2015 FINAL  Final   Organism ID, Bacteria VANCOMYCIN RESISTANT ENTEROCOCCUS  Final      Susceptibility   Vancomycin resistant enterococcus - MIC*    AMPICILLIN >=32 RESISTANT Resistant     LEVOFLOXACIN >=8 RESISTANT Resistant     NITROFURANTOIN 128 RESISTANT Resistant     VANCOMYCIN >=32 RESISTANT Resistant     LINEZOLID 2 SENSITIVE Sensitive     * >=100,000 COLONIES/mL VANCOMYCIN RESISTANT ENTEROCOCCUS  C difficile quick scan w PCR reflex     Status: Abnormal   Collection Time: 09/09/15  4:14 PM  Result Value Ref Range Status   C Diff antigen POSITIVE (A) NEGATIVE Final   C Diff toxin NEGATIVE NEGATIVE Final   C Diff interpretation   Final    Positive for toxigenic C. difficile, active toxin production not detected. Patient has  toxigenic C. difficile organisms present in the bowel, but toxin was not detected. The patient may be a carrier or the level of toxin in the sample was below the limit  of detection. This information should be used in conjunction with the patient's clinical history when deciding on possible therapy.     Comment: CRITICAL RESULT CALLED TO, READ BACK BY AND VERIFIED WITH: Burnett Corrente ON 09/09/15 AT 2019 BY TLB   Clostridium Difficile by PCR     Status: Abnormal   Collection Time: 09/09/15  4:14 PM  Result Value Ref Range Status   Toxigenic C Difficile by pcr POSITIVE (A) NEGATIVE Final    Comment: CRITICAL RESULT CALLED TO, READ BACK BY AND VERIFIED WITH: Burnett Corrente ON 09/09/15 AT 2019 BY TLB   MRSA PCR Screening     Status: None   Collection Time: 09/11/15  6:52 AM  Result Value Ref Range Status   MRSA by PCR NEGATIVE NEGATIVE Final    Comment:        The GeneXpert MRSA Assay (FDA approved for NASAL specimens only), is one component of a comprehensive MRSA colonization surveillance program. It is not intended to diagnose MRSA infection nor to guide or monitor treatment for MRSA infections.     Medications:  Scheduled:  . buPROPion  300 mg Oral BID  . Dextromethorphan-Quinidine  1 capsule Oral BID  . enoxaparin (LOVENOX) injection  30 mg Subcutaneous Q24H  . fentaNYL      . levETIRAcetam  500 mg Intravenous Q12H  . linezolid (ZYVOX) IV  600 mg Intravenous Q12H  . midazolam  4 mg Intravenous Once  . phenylephrine  1 suppository Rectal BID  . tiZANidine  2 mg Oral QHS  . vancomycin  125 mg Oral 4 times per day   Infusions:  . 0.9 % NaCl with KCl 20 mEq / L 50 mL/hr (09/10/15 2156)    Assessment: Pharmacy consulted for electrolyte management for 55 yo female ICU patient requiring mechanical ventilation.     Plan:  No replacement warranted at this time. Will order follow up electrolytes with am labs.   Pharmacy will continue to monitor and adjust per consult.     Simpson,Michael L 09/11/2015,2:41 PM

## 2015-09-11 NOTE — Progress Notes (Signed)
Pt in bed tachypneic and with a non rebreather. Respiratory is already at bedside . Pt lungs are wet. physician already notified by the time i arrived and gave lasix orders and stat chest xray and transferred to icu for a bipap. To room 4

## 2015-09-11 NOTE — Progress Notes (Signed)
Patient orally intubated by Dr. Rosealee Albee given as ordered.

## 2015-09-11 NOTE — Progress Notes (Signed)
Pharmacy Antibiotic Note  Jennifer [name] is a 55 y.o. female admitted on 09/04/2015 with UTI/sepsis.  Pharmacy has been consulted for Zosyn dosing per ID for possible HAP/Aspiration PNA. Patient with urine culture growing VRE.   Plan:  Continue Zosyn EI 3.375g IV Q8hr. Will continue to follow for signs of infection resolution.  UA: LE(+) NO2(-) WBC TNTC Blood and urine cx pending CXR: Clear  Height:  (157.5 cm) Weight: 95 lb 0.3 oz (43.1 kg) IBW/kg (Calculated) : 50.1  Temp (24hrs), Avg:98.8 F (37.1 C), Min:97.4 F (36.3 C), Max:100.1 F (37.8 C)   Recent Labs Lab 09/04/15 2101 09/05/15 0519  09/06/15 0513 09/07/15 0444 09/08/15 0457 09/09/15 0427 09/10/15 0618 09/11/15 0532  WBC 11.3* 11.7*  --  15.2*  --  14.8*  --   --  23.7*  CREATININE 0.69 0.48  < > 0.58 0.55 0.48 0.37* 0.38* 0.30*  LATICACIDVEN 2.1* 0.8  --   --   --   --   --   --   --   < > = values in this interval not displayed.  Estimated Creatinine Clearance: 54.7 mL/min (by C-G formula based on Cr of 0.3).    Allergies  Allergen Reactions  . Bee Venom Anaphylaxis  . Hydrocodone Other (See Comments)    Reaction:  Muscle stiffness   . Lipitor [Atorvastatin] Other (See Comments)    Reaction:  Muscle cramps   . Shellfish Allergy Anaphylaxis  . Oxycodone Other (See Comments)    Reaction:  Muscle stiffness   . Prednisone Other (See Comments)    Reaction:  Psychosis   . Vicodin [Hydrocodone-Acetaminophen] Other (See Comments)    Reaction:  Muscle stiffness   . Oxycontin [Oxycodone Hcl] Other (See Comments)    Reaction:  Muscle stiffness   . Peanut-Containing Drug Products Diarrhea    Pharmacy will continue to monitor and adjust per consult.   Irl Bodie L 09/11/2015 7:16 PM

## 2015-09-11 NOTE — Clinical Documentation Improvement (Signed)
Internal Medicine  Can the diagnosis of pressure ulcer be further specified?   Document if pressure ulcer with stage is Present on Admission   Document Site with laterality - Elbow, Back (upper/lower), Sacral, Hip, Buttock, Ankle, Heel, Head, Other (Specify)  Pressure Ulcer Stage - Stage1, Stage 2, Stage 3, Stage 4, Unstageable, Unspecified, Unable to Clinically Determine  Other  Clinically Undetermined  Please update your documentation within the medical record to reflect your response to this query. Thank you.  Supporting Information: (As per notes) " Pressure Ulcer"  Please exercise your independent, professional judgment when responding. A specific answer is not anticipated or expected.  Thank You, Nevin Bloodgood, RN, BSN, CCDS,Clinical Documentation Specialist:  [phone]  [phone]=[facility]- Health Information Management

## 2015-09-11 NOTE — Procedures (Signed)
  Procedure Note: Central Venous Catheter Placement - LEFT IJ Sula Rumple , [id number] , [id number]  Indications: Hemodynamic monitoring / Intravenous access  Benefits, risks (including bleeding, infection,  Injury, etc.), and alternatives explained to husband who voiced understanding.  Questions were sought and answered.   Husband agreed to proceed with the procedure.  Consent is signed and on chart. A time-out was completed verifying correct patient, procedure and site.  A 3 lumen catheter available at the time of procedure.  The patient was placed in a dependent position appropriate for central line placement based on the vein to be cannulated.   The patient's LEFT Internal Jugular Vein was prepped and draped in a sterile fashion.  1% Lidocaine WAS NOT used to anesthetize the surrounding skin area.   A 3 lumen catheter was introduced into the LEFT Internal Jugular Vein using Seldinger technique, visualized under ultrasound.  The catheter was threaded smoothly over the guide wire and appropriate blood return was obtained.  Each lumen of the catheter was evacuated of air and flushed with sterile saline.  The catheter was then sutured in place to the skin and a sterile dressing applied.  Perfusion to the extremity distal to the point of catheter insertion was checked and found to be adequate.    Chest X-ray was ordered for confirmation of placement.  The patient tolerated the procedure well and there were no complications.  Stephanie Acre, MD [facility] Pager [phone] (please enter 7-digits) On Call Pager - [phone] (please enter 7-digits)

## 2015-09-11 NOTE — Progress Notes (Signed)
Procedure Note:  Orotracheal Intubation  Implied consent due to emergent nature of patient's condition. Correct Patient, Name & ID confirmed.  The patient was pre-oxygenated and then, under direct visualization, a 8.0 mm cuffed endotracheal tube was placed through the vocal cords into the trachea, using the Glidescope.  Total attempts made 1, using Glidescope During intubation an assistant applied gentle pressure to the cricoid cartilage.  Position confirmed by auscultation of lungs (good breath sounds bilaterally) and no stomach sounds. Tube secured at 22 cm. Pulse ox 100 %. CO2 detector in place with appropriate color change.   Pt tolerated procedure well.  No complications were noted.   CXR ordered.   Stephanie Acre, MD  Pulmonary and Critical Care Pager [phone] On Call Pager [phone]

## 2015-09-11 NOTE — Consult Note (Signed)
PULMONARY / CRITICAL CARE MEDICINE   Name: Jennifer [name] MRN: [id number] DOB: 11-09-1960    ADMISSION DATE:  09/04/2015 CONSULTATION DATE:  09/11/15  REFERRING MD :  Dr. Amado Coe   CHIEF COMPLAINT:     Reason for consult respiratory distress and impending respiratory failure   HISTORY OF PRESENT ILLNESS  History per chart review and from husband. Patient is a 55 year old female past medical history of ALS, chronic Foley catheter, admitted on 09/04/2015 for hallucination/weakness/confusion. She is bedbound, but can verbalize her needs, and can move arms against gravity. 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 On 09/10/15 patient started to become more labored breathing with increased oxygen requirement, initially requiring nasal cannula,followed by nonrebreather followed eventually by BiPAP this morning around 6 AM, at which time she was transferred to the ICU for further monitoring. She was monitored in the ICU on BiPAP for about 6 hours, with worsening mentation, however ABG showed adequate compensatory response with good oxygenation. After further discussion with her husband, given her possible UTI sepsis, right lower lobe pneumonia, worsening mentation, and inability to protect airway in the setting of ALS, it was decided to intubate the patient.    SIGNIFICANT EVENTS  2/15>> transferred to ICU on BiPAP for respiratory distress, subsequently intubated   PAST MEDICAL HISTORY    :  Past Medical History  Diagnosis Date  . ALS (amyotrophic lateral sclerosis) (HCC)   . Depression   . GERD (gastroesophageal reflux disease)   . Urinary retention   . Dysphagia, oropharyngeal 11/28/2013  . CN (constipation) 11/28/2013   Past Surgical History  Procedure Laterality Date  . Cesarean section    . Nasal sinus surgery      Prior to Admission medications   Medication Sig Start Date End Date Taking? Authorizing Provider  ARIPiprazole (ABILIFY) 5 MG tablet TAKE 1 TABLET (5 MG TOTAL) BY MOUTH DAILY. 07/15/15  Yes [name], MD  atropine 1 % ophthalmic solution Place 2 drops under the tongue as needed (for excessive secretions).   Yes Historical Provider, MD  buPROPion (WELLBUTRIN SR) 150 MG 12 hr tablet Take 300 mg by mouth 2 (two) times daily.   Yes Historical Provider, MD  cholecalciferol (VITAMIN D) 1000 UNITS tablet Take 1,000 Units by mouth daily.   Yes Historical Provider, MD  Dextromethorphan-Quinidine (NUEDEXTA) 20-10 MG CAPS Take 1 capsule by mouth 2 (two) times daily.    Yes Historical Provider, MD  diazepam (VALIUM) 10 MG tablet Take 1 tablet (10 mg total) by mouth every 6 (six) hours as needed. 08/19/15  Yes [name], MD  fluticasone (FLONASE) 50 MCG/ACT nasal spray Place 2 sprays into both nostrils daily as needed for rhinitis.   Yes Historical Provider, MD  fosfomycin (MONUROL) 3 G PACK Take 3 g by mouth every other day. 06/29/15  Yes Enedina Finner, MD  ibuprofen (ADVIL,MOTRIN) 200 MG tablet Take 800 mg by mouth 3 (three) times daily as needed for mild pain.    Yes Historical Provider, MD  levETIRAcetam (KEPPRA) 500 MG tablet Take 500 mg by mouth 2 (two) times daily.    Yes Historical Provider, MD  loratadine (CLARITIN) 10 MG tablet Take 10 mg by mouth daily as needed for allergies.    Yes Historical Provider, MD  omeprazole (PRILOSEC) 40 MG capsule Take 40 mg by mouth daily.    Yes Historical Provider, MD  potassium chloride 20 MEQ/15ML (10%) SOLN Take 20 mEq by mouth daily.   Yes Historical Provider, MD  ranitidine (ZANTAC) 150 MG tablet Take 150 mg by mouth daily as needed for heartburn.   Yes Historical Provider, MD  tizanidine (ZANAFLEX) 2 MG capsule Take 2 mg by mouth at bedtime.    Yes Historical Provider, MD  ciprofloxacin (CIPRO) 500 MG tablet Take 1 tablet (500 mg total) by mouth 2 (two)  times daily. Patient not taking: Reported on 09/04/2015 08/15/15   [name], MD  lactulose (CHRONULAC) 10 GM/15ML solution Take 15 mLs (10 g total) by mouth 3 (three) times daily as needed for mild constipation. 09/08/15   Enid Baas, MD   Allergies  Allergen Reactions  . Bee Venom Anaphylaxis  . Hydrocodone Other (See Comments)    Reaction:  Muscle stiffness   . Lipitor [Atorvastatin] Other (See Comments)    Reaction:  Muscle cramps   . Shellfish Allergy Anaphylaxis  . Oxycodone Other (See Comments)    Reaction:  Muscle stiffness   . Prednisone Other (See Comments)    Reaction:  Psychosis   . Vicodin [Hydrocodone-Acetaminophen] Other (See Comments)    Reaction:  Muscle stiffness   . Oxycontin [Oxycodone Hcl] Other (See Comments)    Reaction:  Muscle stiffness   . Peanut-Containing Drug Products Diarrhea     FAMILY HISTORY   Family History  Problem Relation Age of Onset  . COPD Mother   . Thyroid disease Mother   . Diverticulosis Mother   . Heart attack Mother   . Gout Father   . Depression Sister   . Thyroid disease Sister   . Cancer Brother     kidney  . Heart attack Maternal Grandmother   . Heart attack Maternal Grandfather   . Heart attack Paternal Grandfather   . Hyperlipidemia Sister   . Evelene Croon Parkinson White syndrome Sister 25  . Depression Brother   . Hypertension Brother       SOCIAL HISTORY    reports that she has quit smoking. She has never used smokeless tobacco. She reports that she does not drink alcohol or use illicit drugs.  Review of Systems  Unable to perform ROS: critical illness      VITAL SIGNS    Temp:  [97.8 F (36.6 C)-100.1 F (37.8 C)] 97.8 F (36.6 C) (02/15 0809) Pulse Rate:  [87-104] 87 (02/15 0700) Resp:  [18-55] 47 (02/15 0700) BP: (103-146)/(60-89) 103/68 mmHg (02/15 0700) SpO2:  [76 %-100 %] 98 % (02/15 0700) FiO2 (%):  [2 %-70 %] 70 % (02/15 1130) Weight:  [95 lb 0.3 oz (43.1 kg)] 95 lb 0.3 oz (43.1 kg)  (02/15 0649) HEMODYNAMICS:   VENTILATOR SETTINGS: Vent Mode:  [-] PRVC FiO2 (%):  [2 %-70 %] 70 % Set Rate:  [15 bmp] 15 bmp Vt Set:  [400 mL] 400 mL PEEP:  [5 cmH20] 5 cmH20 INTAKE / OUTPUT:  Intake/Output Summary (Last 24 hours) at 09/11/15 1320 Last data filed at 09/11/15 0553  Gross per 24 hour  Intake    400 ml  Output    550 ml  Net   -150 ml       PHYSICAL EXAM   Physical Exam  Constitutional: She appears distressed.  Cachectic appearance laying in bed in respiratory distress  HENT:  Head: Normocephalic and atraumatic.  Right Ear: External ear normal.  Left Ear: External ear normal.  Mouth/Throat: No oropharyngeal exudate.  Eyes: Conjunctivae and EOM are normal.  Pupils are equal, round, and reactive to light.  Neck: Normal range of motion. No tracheal deviation present. No thyromegaly present.  Cardiovascular: Intact distal pulses.  Exam reveals no friction rub.   No murmur heard. tachycardia  Pulmonary/Chest: No stridor. She is in respiratory distress.  Respiratory distress on BiPAP, positive use of SCM muscles and abdominal-thoracic breathing  Abdominal: Soft. Bowel sounds are normal. She exhibits no distension.  Musculoskeletal: Normal range of motion.  Neurological:  Altered mental status-somnolence, he withdraws to pain  Skin: Skin is warm and dry.  Nursing note and vitals reviewed.      LABS   LABS:  CBC  Recent Labs Lab 09/06/15 0513 09/08/15 0457 09/11/15 0532  WBC 15.2* 14.8* 23.7*  HGB 10.4* 9.0* 11.0*  HCT 30.0* 27.0* 32.6*  PLT 238 266 410   Coag's No results for input(s): APTT, INR in the last 168 hours. BMET  Recent Labs Lab 09/09/15 0427 09/09/15 1839 09/10/15 0618 09/11/15 0532  NA 145  --  142 142  K 3.9 4.0 4.0 4.1  CL 120*  --  110 114*  CO2 22  --  16* 24  BUN 13  --  9 10  CREATININE 0.37*  --  0.38* 0.30*  GLUCOSE 120*  --  77 82   Electrolytes  Recent Labs Lab 09/09/15 0427 09/09/15 1839  09/10/15 0618 09/11/15 0532  CALCIUM 8.0*  --  7.2* 7.6*  MG 2.1  --  1.8 2.1  PHOS 1.1* 2.4* 2.2* 2.9   Sepsis Markers  Recent Labs Lab 09/04/15 2101 09/05/15 0519  LATICACIDVEN 2.1* 0.8   ABG  Recent Labs Lab 09/11/15 0916  PHART 7.44  PCO2ART 34  PO2ART 67*   Liver Enzymes  Recent Labs Lab 09/04/15 2101  AST 20  ALT 11*  ALKPHOS 51  BILITOT 0.9  ALBUMIN 3.7   Cardiac Enzymes  Recent Labs Lab 09/04/15 2101  TROPONINI 0.03   Glucose No results for input(s): GLUCAP in the last 168 hours.   Recent Results (from the past 240 hour(s))  Urine culture     Status: None   Collection Time: 09/04/15  9:01 PM  Result Value Ref Range Status   Specimen Description URINE, RANDOM  Final   Special Requests NONE  Final   Culture MULTIPLE SPECIES PRESENT, SUGGEST RECOLLECTION  Final   Report Status 09/06/2015 FINAL  Final  Blood Culture (routine x 2)     Status: None   Collection Time: 09/04/15  9:41 PM  Result Value Ref Range Status   Specimen Description BLOOD RIGHT ARM  Final   Special Requests BOTTLES DRAWN AEROBIC AND ANAEROBIC  Final   Culture NO GROWTH 5 DAYS  Final   Report Status 09/09/2015 FINAL  Final  Blood Culture (routine x 2)     Status: None   Collection Time: 09/04/15  9:41 PM  Result Value Ref Range Status   Specimen Description BLOOD LEFT ARM  Final   Special Requests BOTTLES DRAWN AEROBIC AND ANAEROBIC  Final   Culture NO GROWTH 5 DAYS  Final   Report Status 09/09/2015 FINAL  Final  Urine culture     Status: None   Collection Time: 09/06/15  4:58 PM  Result Value Ref Range Status   Specimen Description URINE, CATHETERIZED  Final   Special Requests ertapenem and vanco Normal  Final   Culture   Final    >=100,000 COLONIES/mL VANCOMYCIN RESISTANT ENTEROCOCCUS ENTEROCOCCUS FAECIUM VRE HAVE INTRINSIC RESISTANCE TO  MOST COMMONLY USED ANTIBIOTICS AND THE ABILITY TO ACQUIRE RESISTANCE TO MOST AVAILABLE ANTIBIOTICS. WITH MIXED BACTERIAL  ORGANISMS    Report Status 09/10/2015 FINAL  Final   Organism ID, Bacteria VANCOMYCIN RESISTANT ENTEROCOCCUS  Final      Susceptibility   Vancomycin resistant enterococcus - MIC*    AMPICILLIN >=32 RESISTANT Resistant     LEVOFLOXACIN >=8 RESISTANT Resistant     NITROFURANTOIN 128 RESISTANT Resistant     VANCOMYCIN >=32 RESISTANT Resistant     LINEZOLID 2 SENSITIVE Sensitive     * >=100,000 COLONIES/mL VANCOMYCIN RESISTANT ENTEROCOCCUS  C difficile quick scan w PCR reflex     Status: Abnormal   Collection Time: 09/09/15  4:14 PM  Result Value Ref Range Status   C Diff antigen POSITIVE (A) NEGATIVE Final   C Diff toxin NEGATIVE NEGATIVE Final   C Diff interpretation   Final    Positive for toxigenic C. difficile, active toxin production not detected. Patient has toxigenic C. difficile organisms present in the bowel, but toxin was not detected. The patient may be a carrier or the level of toxin in the sample was below the limit  of detection. This information should be used in conjunction with the patient's clinical history when deciding on possible therapy.     Comment: CRITICAL RESULT CALLED TO, READ BACK BY AND VERIFIED WITH: Burnett Corrente ON 09/09/15 AT 2019 BY TLB   Clostridium Difficile by PCR     Status: Abnormal   Collection Time: 09/09/15  4:14 PM  Result Value Ref Range Status   Toxigenic C Difficile by pcr POSITIVE (A) NEGATIVE Final    Comment: CRITICAL RESULT CALLED TO, READ BACK BY AND VERIFIED WITH: Burnett Corrente ON 09/09/15 AT 2019 BY TLB   MRSA PCR Screening     Status: None   Collection Time: 09/11/15  6:52 AM  Result Value Ref Range Status   MRSA by PCR NEGATIVE NEGATIVE Final    Comment:        The GeneXpert MRSA Assay (FDA approved for NASAL specimens only), is one component of a comprehensive MRSA colonization surveillance program. It is not intended to diagnose MRSA infection nor to guide or monitor treatment for MRSA infections.      Current  facility-administered medications:  .  0.9 % NaCl with KCl 20 mEq/ L  infusion, , Intravenous, Continuous, Enid Baas, MD, Last Rate: 50 mL/hr at 09/10/15 2156, 50 mL/hr at 09/10/15 2156 .  acetaminophen (TYLENOL) suppository 650 mg, 650 mg, Rectal, Q4H PRN, Milagros Loll, MD, 650 mg at 09/09/15 1054 .  albuterol (PROVENTIL) (2.5 MG/3ML) 0.083% nebulizer solution 2.5 mg, 2.5 mg, Nebulization, Q2H PRN, Srikar Sudini, MD .  atropine 1 % ophthalmic solution 2 drop, 2 drop, Sublingual, QID PRN, Enid Baas, MD .  bisacodyl (DULCOLAX) suppository 10 mg, 10 mg, Rectal, Daily PRN, Milagros Loll, MD .  buPROPion [facility] SR) 12 hr tablet 300 mg, 300 mg, Oral, BID, Enid Baas, MD, 300 mg at 09/10/15 2221 .  Dextromethorphan-Quinidine 20-10 MG CAPS 1 capsule, 1 capsule, Oral, BID, Altamese Dilling, MD, 1 capsule at 09/10/15 2222 .  diazepam (VALIUM) tablet 10 mg, 10 mg, Oral, Q6H PRN, Enid Baas, MD, 10 mg at 09/10/15 1902 .  enoxaparin (LOVENOX) injection 30 mg, 30 mg, Subcutaneous, Q24H, Srikar Sudini, MD, 30 mg at 09/10/15 2222 .  fentaNYL (SUBLIMAZE) 100 MCG/2ML injection, , , ,  .  ibuprofen (ADVIL,MOTRIN) tablet 400 mg, 400 mg, Oral, Q6H PRN, Auburn Bilberry, MD,  400 mg at 09/06/15 1904 .  levETIRAcetam (KEPPRA) 500 mg in sodium chloride 0.9 % 100 mL IVPB, 500 mg, Intravenous, Q12H, Srikar Sudini, MD, 500 mg at 09/11/15 0945 .  linezolid (ZYVOX) tablet 600 mg, 600 mg, Oral, Q12H, Bertram Savin, RPH, 600 mg at 09/11/15 1000 .  midazolam (VERSED) injection 4 mg, 4 mg, Intravenous, Once, Nealy Hickmon, MD .  ondansetron (ZOFRAN) tablet 4 mg, 4 mg, Oral, Q6H PRN **OR** ondansetron (ZOFRAN) injection 4 mg, 4 mg, Intravenous, Q6H PRN, Milagros Loll, MD, 4 mg at 09/10/15 1901 .  phenylephrine ((USE for PREPARATION-H)) 0.25 % suppository 1 suppository, 1 suppository, Rectal, BID, Lattie Haw, MD, 1 suppository at 09/10/15 2223 .  polyethylene glycol (MIRALAX /  GLYCOLAX) packet 17 g, 17 g, Oral, Daily PRN, Srikar Sudini, MD .  tiZANidine (ZANAFLEX) tablet 2 mg, 2 mg, Oral, QHS, Enid Baas, MD, 2 mg at 09/10/15 2221 .  vancomycin (VANCOCIN) 50 mg/mL oral solution 125 mg, 125 mg, Oral, 4 times per day, Clydie Braun, MD, 125 mg at 09/10/15 2325  IMAGING    Dg Chest Port 1 View  09/11/2015  CLINICAL DATA:  Shortness of breath and dysphagia EXAM: PORTABLE CHEST 1 VIEW COMPARISON:  September 10, 2015 FINDINGS: There is new airspace consolidation in the left mid and lower lung zones. There is significant increased consolidation in the right mid lower lung zones. The degree of inspiration shallow. Heart is upper normal in size with pulmonary vascular within normal limits. No adenopathy. No bone lesions. IMPRESSION: Increased consolidation throughout the right mid and lower lung zones. New consolidation left mid and lower lung zones. Shallow inspiration again noted. Electronically Signed   By: Bretta Bang III M.D.   On: 09/11/2015 07:02      Indwelling Urinary Catheter continued, requirement due to   Reason to continue Indwelling Urinary Catheter for strict Intake/Output monitoring for hemodynamic instability   Central Line continued, requirement due to   Reason to continue [employer] Monitoring of central venous pressure or other hemodynamic parameters   Ventilator continued, requirement due to, resp failure    Ventilator Sedation RASS 0 to -2   Cultures: BCx2  UC 2/10-VRE Sputum MRSA PCR negative C. Difficile-antigen positive Influenza panel negative   Antibiotics: Vancomycin linezolid Lines:   ASSESSMENT/PLAN  55 year old female past medical history of ALS, chronic Foley catheter, recurrent UTI,initially admitted on 2/8 weakness, hallucination, secondary to possible GI sepsis, hospitalization complicated with C. Difficile, respiratory failure requiring intubation   PULMONARY OETT 8.0 Respiratory failure-ventilator  dependent ALS Sepsis Right lower lobe atelectasis/pneumonia P:   -continue mechanical ventilation, wean as tolerated, keep O2 saturations greater than 80% -SBT/WUA daily -I suspect given her neuro muscular disorder (ALS), her liberation from the ventilator may be prolonged -husband Has been updated at bedside -ABG as needed, chest x-ray as needed  -May consider bronchoscopy with BAL, review of chest x-rays mainly atelectasis, could have superimposed pneumonia  CARDIOVASCULAR CVL LIJ Continue to monitor hemodynamics,    RENAL A:  Chronic indwelling Foley catheter, chronic urinary tract infection, VRE P:   -continue antibiotics as directed by infectious disease - ICU electrolyte replacement protocol -Monitor renal function, avoid nephrotoxic drugs  GASTROINTESTINAL GERD-continue PPI Protein malnutrition-start tube feeds  INFECTIOUS UTI-VRE Positive for C. Difficile antigen P:   - continue antibiotics as directed by infectious disease, currently on linezolid and oral vancomycin  ENDOCRINE -ICU hypo/ hyperglycemic protocol  NEUROLOGIC ALS Seizure disorder P:   RASS goal:- 1 -continue with  ALS medications, bed bound at baseline, able to sit without assistance, able to verbally communicate at baseline. -Continue with antidepressants and muscle relaxants -Continue her Keppra    I have personally obtained a history, examined the patient, evaluated laboratory and imaging results, formulated the assessment and plan and placed orders.  The Patient requires high complexity decision making for assessment and support, frequent evaluation and titration of therapies, application of advanced monitoring technologies and extensive interpretation of multiple databases. Critical Care Time devoted to patient care services described in this note is 85 minutes.   Overall, patient is critically ill, prognosis is guarded. Patient at high risk for cardiac arrest and death.   Stephanie Acre,  MD [facility] Pager [phone] (please enter 7-digits) On Call Pager - [phone] (please enter 7-digits)     09/11/2015, 1:20 PM  Note: This note was prepared with Dragon dictation along with smaller phrase technology. Any transcriptional errors that result from this process are unintentional.

## 2015-09-11 NOTE — Progress Notes (Signed)
Update: Patient stable on the vent, resting comfortable. S/P bronchoscopy. BAL sent for micro, fungal, viral, afb. CVL retracted 2 cm. Images reviewed - lines/tubes good for use. Husband updated   Stephanie Acre, MD [facility] Pager [phone] (please enter 7-digits) On Call Pager - [phone] (please enter 7-digits)

## 2015-09-11 NOTE — Progress Notes (Signed)
[facility] [place] [facility]   PATIENT NAME: Jennifer [name]    MR#:  [id number]  DATE OF BIRTH:  Sep 26, 1960  SUBJECTIVE:  CHIEF COMPLAINT:   Chief Complaint  Patient presents with  . Altered Mental Status   - Patient was short of breath this a.m. and was moved to intensive care unit. Initially patient was on nonrebreather subsequently BiPAP and eventually she got intubated today by the pulmonologist. Central line was placed and planning to do bronchoscopy  -Husband is at bedside, aware of the plan of care REVIEW OF SYSTEMS:  Review of Systems  Unable to perform ROS: mental acuity    DRUG ALLERGIES:   Allergies  Allergen Reactions  . Bee Venom Anaphylaxis  . Hydrocodone Other (See Comments)    Reaction:  Muscle stiffness   . Lipitor [Atorvastatin] Other (See Comments)    Reaction:  Muscle cramps   . Shellfish Allergy Anaphylaxis  . Oxycodone Other (See Comments)    Reaction:  Muscle stiffness   . Prednisone Other (See Comments)    Reaction:  Psychosis   . Vicodin [Hydrocodone-Acetaminophen] Other (See Comments)    Reaction:  Muscle stiffness   . Oxycontin [Oxycodone Hcl] Other (See Comments)    Reaction:  Muscle stiffness   . Peanut-Containing Drug Products Diarrhea    VITALS:  Blood pressure 103/68, pulse 87, temperature 97.8 F (36.6 C), temperature source Axillary, resp. rate 47, height  (1.575 m), weight 43.1 kg (95 lb 0.3 oz), SpO2 98 %.  PHYSICAL EXAMINATION:  Physical Exam  GENERAL:  55 y.o.-year-old patient lying in the bed with respiratory distress, on BiPAP Patient has ALS. Appears contracted in bed EYES: Pupils equal, round, reactive to light and accommodation. No scleral icterus. HEENT: Head atraumatic, normocephalic. Oropharynx and nasopharynx clear.  NECK:  Supple, no jugular venous distention. No thyroid enlargement, no tenderness.  LUNGS: Diminished and coarse breath sounds bilaterally, positive use of accessory  muscles of respiration. Positive rales and rhonchi CARDIOVASCULAR: S1, S2 normal. No murmurs, rubs, or gallops.  ABDOMEN: Soft, nontender, nondistended. Bowel sounds present. No organomegaly or mass.  EXTREMITIES: No pedal edema, cyanosis, or clubbing.  NEUROLOGIC: Lying in bed, contracted appearing, .not following commands, PSYCHIATRIC: Could not be elicited as the patient is on BiPAP  SKIN: No obvious rash, lesion, or ulcer.    LABORATORY PANEL:   CBC  Recent Labs Lab 09/11/15 0532  WBC 23.7*  HGB 11.0*  HCT 32.6*  PLT 410   ------------------------------------------------------------------------------------------------------------------  Chemistries   Recent Labs Lab 09/04/15 2101  09/11/15 0532  NA 136  < > 142  K 3.1*  < > 4.1  CL 100*  < > 114*  CO2 26  < > 24  GLUCOSE 82  < > 82  BUN 15  < > 10  CREATININE 0.69  < > 0.30*  CALCIUM 9.0  < > 7.6*  MG  --   < > 2.1  AST 20  --   --   ALT 11*  --   --   ALKPHOS 51  --   --   BILITOT 0.9  --   --   < > = values in this interval not displayed. ------------------------------------------------------------------------------------------------------------------  Cardiac Enzymes  Recent Labs Lab 09/04/15 2101  TROPONINI 0.03   ------------------------------------------------------------------------------------------------------------------  RADIOLOGY:  Dg Chest 1 View  09/10/2015  CLINICAL DATA:  Hypoxia EXAM: CHEST 1 VIEW COMPARISON:  September 04, 2015 FINDINGS: The degree of inspiration is shallow. There  is focal airspace consolidation in the right base. There is mild atelectasis in both lower lung zones. Heart is upper normal in size with pulmonary vascularity within normal limits. No adenopathy. No bone lesions. IMPRESSION: Right lower lobe airspace consolidation. Note shallow degree of inspiration. No change in cardiac silhouette. Electronically Signed   By: Bretta Bang III M.D.   On: 09/10/2015 13:44    Dg Chest Port 1 View  09/11/2015  CLINICAL DATA:  Shortness of breath and dysphagia EXAM: PORTABLE CHEST 1 VIEW COMPARISON:  September 10, 2015 FINDINGS: There is new airspace consolidation in the left mid and lower lung zones. There is significant increased consolidation in the right mid lower lung zones. The degree of inspiration shallow. Heart is upper normal in size with pulmonary vascular within normal limits. No adenopathy. No bone lesions. IMPRESSION: Increased consolidation throughout the right mid and lower lung zones. New consolidation left mid and lower lung zones. Shallow inspiration again noted. Electronically Signed   By: Bretta Bang III M.D.   On: 09/11/2015 07:02    EKG:   Orders placed or performed during the hospital encounter of 09/04/15  . ED EKG 12-Lead  . ED EKG 12-Lead    ASSESSMENT AND PLAN:   55 year old female with known history of ALS with chronic Foley catheter and prior history of multiple UTIs, depression, GERD was brought in secondary to altered mental status.   #Acute hypoxic respiratory failure probably secondary to aspiration pneumonia/sepsis and ALS Significant improvement with BiPAP and got intubated today by intensivist Central line is placed Pulmonologist is planning for bronchoscopy today We will start tube feeds Continue current antibiotics Appreciate pulmonologist input   # Metabolic encephalopathy-secondary to sepsis -Continue current IV antibiotic Zyvox and  Vancomycin, per ID recommendations  Sepsis- VRE UTI , C. difficile and pneumonia now. -ID consult appreciated.  -- Influenza test is negative. -    #3 hypokalemia-known history of chronic hypokalemia and takes potassium supplements at home. Replace as needed   #4 seizure disorder-continue Keppra    #5 ALS-following with neurologist at [facility]. -Bed bound at baseline, but still able to sit with assistance and feed herself and alert and interactive. -Continue to monitor as  patient is not close to her baseline. Also takes antidepressants and muscle relaxants for the same. Restart now as more alert and able to take oral meds  #6 Dysphagia- was evaluated by speech therapy and was on dysphagia diet Currently nothing by mouth, starting tube feeds  #7 Severe Malnutrition- dietary consulted Start tube feeds while patient is nothing by mouth and on ventilator  #7 DVT prophylaxis-on Lovenox  #8 C diff- oral vanc. Via tube  #9 Pneumonia, probably from aspiration   Linezolid Discussed with Dr. Sampson Goon ID, await his recommendations   All the records are reviewed and case discussed with Care Management/Social Workerr. Management plans discussed with the patient, family and they are in agreement.  CODE STATUS: Full Code  TOTAL CRITICAL CARE TIME TAKING CARE OF THIS PATIENT: 35 minutes.  Spoke to her husband bedside POSSIBLE D/C IN 1-2 DAYS, DEPENDING ON CLINICAL CONDITION.   Ramonita Lab M.D on 09/11/2015 at 1:51 PM  Between 7am to 6pm - Pager - [phone]  After 6pm go to [email or web address] - password [email or web address] [facility]  [facility]  Office  [phone]  CC: Primary care physician; Ruthe Mannan, MD

## 2015-09-11 NOTE — Progress Notes (Signed)
[facility] CLINIC INFECTIOUS DISEASE PROGRESS NOTE Date of Admission:  09/04/2015     ID: Jennifer [name] is a 55 y.o. female with recurrent UTI  Active Problems:   UTI (lower urinary tract infection)   Pressure ulcer   Protein-calorie malnutrition, severe   Hemorrhoid prolapse   Hypoxia   Sepsis (HCC)   Acute respiratory failure (HCC)   Breathing difficult   Respiratory failure requiring intubation (HCC)  Subjective: resp decompensated- now in unit intuabted BAL done  Low grade fevers, wbc up to 23   ROS  Unable to obtain  Medications:  Antibiotics Given (last 72 hours)    Date/Time Action Medication Dose Rate   09/08/15 1754 Given   DAPTOmycin (CUBICIN) 200 mg in sodium chloride 0.9 % IVPB 200 mg 208 mL/hr   09/08/15 2310 Given   meropenem (MERREM) 1 g in sodium chloride 0.9 % 100 mL IVPB 1 g 200 mL/hr   09/09/15 1758 Given   DAPTOmycin (CUBICIN) 200 mg in sodium chloride 0.9 % IVPB 200 mg 208 mL/hr   09/09/15 2122 Given   metroNIDAZOLE (FLAGYL) IVPB 500 mg 500 mg 100 mL/hr   09/10/15 0319 Given   metroNIDAZOLE (FLAGYL) IVPB 500 mg 500 mg 100 mL/hr   09/10/15 1248 Given   metroNIDAZOLE (FLAGYL) IVPB 500 mg 500 mg 100 mL/hr   09/10/15 1902 Given   vancomycin (VANCOCIN) 50 mg/mL oral solution 125 mg 125 mg    09/10/15 2221 Given   linezolid (ZYVOX) tablet 600 mg 600 mg    09/10/15 2325 Given   vancomycin (VANCOCIN) 50 mg/mL oral solution 125 mg 125 mg      . buPROPion  300 mg Oral BID  . Dextromethorphan-Quinidine  1 capsule Oral BID  . enoxaparin (LOVENOX) injection  30 mg Subcutaneous Q24H  . fentaNYL      . levETIRAcetam  500 mg Intravenous Q12H  . linezolid (ZYVOX) IV  600 mg Intravenous Q12H  . midazolam  4 mg Intravenous Once  . phenylephrine  1 suppository Rectal BID  . tiZANidine  2 mg Oral QHS  . vancomycin  125 mg Oral 4 times per day    Objective: Vital signs in last 24 hours: Temp:  [97.8 F (36.6 C)-100.1 F (37.8 C)] 97.8 F (36.6 C) (02/15  0809) Pulse Rate:  [87-104] 87 (02/15 0700) Resp:  [18-55] 47 (02/15 0700) BP: (103-146)/(60-89) 103/68 mmHg (02/15 0700) SpO2:  [76 %-100 %] 98 % (02/15 0700) FiO2 (%):  [2 %-70 %] 70 % (02/15 1130) Weight:  [43.1 kg (95 lb 0.3 oz)] 43.1 kg (95 lb 0.3 oz) (02/15 0649) GENERAL: Intubated sedated EYES: Pupils equal, round, reactive to light and accommodation. No scleral icterus.  HEENT: ETT NECK: Supple, no jugular venous distention. No thyroid enlargement, no tenderness.  LUNGS:Rhonchi   CARDIOVASCULAR: S1, S2 normal.  ABDOMEN: Soft, nontender, nondistended. Bowel sounds present. No organomegaly or mass.  EXTREMITIES: No pedal edema, cyanosis, or clubbing. + NEUROLOGIC: Slumped to the right side. Does not follow commands. PSYCHIATRIC: The patient is sedated  SKIN: No obvious rash, lesion, or ulcer. Foley in place with concentrated urine   Lab Results  Recent Labs  09/10/15 0618 09/11/15 0532  WBC  --  23.7*  HGB  --  11.0*  HCT  --  32.6*  NA 142 142  K 4.0 4.1  CL 110 114*  CO2 16* 24  BUN 9 10  CREATININE 0.38* 0.30*    Microbiology: Results for orders placed or performed during the  hospital encounter of 09/04/15  Urine culture     Status: None   Collection Time: 09/04/15  9:01 PM  Result Value Ref Range Status   Specimen Description URINE, RANDOM  Final   Special Requests NONE  Final   Culture MULTIPLE SPECIES PRESENT, SUGGEST RECOLLECTION  Final   Report Status 09/06/2015 FINAL  Final  Blood Culture (routine x 2)     Status: None   Collection Time: 09/04/15  9:41 PM  Result Value Ref Range Status   Specimen Description BLOOD RIGHT ARM  Final   Special Requests BOTTLES DRAWN AEROBIC AND ANAEROBIC  Final   Culture NO GROWTH 5 DAYS  Final   Report Status 09/09/2015 FINAL  Final  Blood Culture (routine x 2)     Status: None   Collection Time: 09/04/15  9:41 PM  Result Value Ref Range Status   Specimen Description BLOOD LEFT ARM  Final   Special  Requests BOTTLES DRAWN AEROBIC AND ANAEROBIC  Final   Culture NO GROWTH 5 DAYS  Final   Report Status 09/09/2015 FINAL  Final  Urine culture     Status: None   Collection Time: 09/06/15  4:58 PM  Result Value Ref Range Status   Specimen Description URINE, CATHETERIZED  Final   Special Requests ertapenem and vanco Normal  Final   Culture   Final    >=100,000 COLONIES/mL VANCOMYCIN RESISTANT ENTEROCOCCUS ENTEROCOCCUS FAECIUM VRE HAVE INTRINSIC RESISTANCE TO MOST COMMONLY USED ANTIBIOTICS AND THE ABILITY TO ACQUIRE RESISTANCE TO MOST AVAILABLE ANTIBIOTICS. WITH MIXED BACTERIAL ORGANISMS    Report Status 09/10/2015 FINAL  Final   Organism ID, Bacteria VANCOMYCIN RESISTANT ENTEROCOCCUS  Final      Susceptibility   Vancomycin resistant enterococcus - MIC*    AMPICILLIN >=32 RESISTANT Resistant     LEVOFLOXACIN >=8 RESISTANT Resistant     NITROFURANTOIN 128 RESISTANT Resistant     VANCOMYCIN >=32 RESISTANT Resistant     LINEZOLID 2 SENSITIVE Sensitive     * >=100,000 COLONIES/mL VANCOMYCIN RESISTANT ENTEROCOCCUS  C difficile quick scan w PCR reflex     Status: Abnormal   Collection Time: 09/09/15  4:14 PM  Result Value Ref Range Status   C Diff antigen POSITIVE (A) NEGATIVE Final   C Diff toxin NEGATIVE NEGATIVE Final   C Diff interpretation   Final    Positive for toxigenic C. difficile, active toxin production not detected. Patient has toxigenic C. difficile organisms present in the bowel, but toxin was not detected. The patient may be a carrier or the level of toxin in the sample was below the limit  of detection. This information should be used in conjunction with the patient's clinical history when deciding on possible therapy.     Comment: CRITICAL RESULT CALLED TO, READ BACK BY AND VERIFIED WITH: Burnett Corrente ON 09/09/15 AT 2019 BY TLB   Clostridium Difficile by PCR     Status: Abnormal   Collection Time: 09/09/15  4:14 PM  Result Value Ref Range Status   Toxigenic C  Difficile by pcr POSITIVE (A) NEGATIVE Final    Comment: CRITICAL RESULT CALLED TO, READ BACK BY AND VERIFIED WITH: Burnett Corrente ON 09/09/15 AT 2019 BY TLB   MRSA PCR Screening     Status: None   Collection Time: 09/11/15  6:52 AM  Result Value Ref Range Status   MRSA by PCR NEGATIVE NEGATIVE Final    Comment:        The GeneXpert MRSA Assay (  FDA approved for NASAL specimens only), is one component of a comprehensive MRSA colonization surveillance program. It is not intended to diagnose MRSA infection nor to guide or monitor treatment for MRSA infections.     Studies/Results: Dg Chest 1 View  09/11/2015  CLINICAL DATA:  Acute respiratory failure. On ventilator. Central line placement. EXAM: CHEST 1 VIEW COMPARISON:  Prior today FINDINGS: New endotracheal tube and nasogastric tube are seen in appropriate position. A new left jugular central venous catheter is seen with tip overlying the mid to lower right atrium. No pneumothorax visualized. Mildly improved aeration of both lungs is seen. Bilateral airspace disease again seen with central perihilar predominance, suspicious for acute pulmonary edema, with pneumonia considered less likely. Probable layering right pleural effusion noted. IMPRESSION: New left jugular central venous catheter tip in mid to lower right atrium. No pneumothorax visualized. Endotracheal tube and nasogastric tube in appropriate position. Improved aeration of both lungs. Bilateral airspace disease with perihilar predominance, suspicious for acute pulmonary edema, with pneumonia considered less likely. Probable layering right pleural effusion. Electronically Signed   By: Myles Rosenthal M.D.   On: 09/11/2015 13:56   Dg Chest 1 View  09/10/2015  CLINICAL DATA:  Hypoxia EXAM: CHEST 1 VIEW COMPARISON:  September 04, 2015 FINDINGS: The degree of inspiration is shallow. There is focal airspace consolidation in the right base. There is mild atelectasis in both lower lung zones.  Heart is upper normal in size with pulmonary vascularity within normal limits. No adenopathy. No bone lesions. IMPRESSION: Right lower lobe airspace consolidation. Note shallow degree of inspiration. No change in cardiac silhouette. Electronically Signed   By: Bretta Bang III M.D.   On: 09/10/2015 13:44   Dg Abd 1 View  09/11/2015  CLINICAL DATA:  NG tube placement EXAM: ABDOMEN - 1 VIEW COMPARISON:  None. FINDINGS: Supine view of the abdomen at 1320 hours shows contrast material in the right colon. NG tube is visualized with tip in the antral pyloric region of the stomach. The bowel gas pattern is nonspecific. IMPRESSION: NG tube tip is in the distal stomach. Electronically Signed   By: Kennith Center M.D.   On: 09/11/2015 13:54   Dg Chest Port 1 View  09/11/2015  CLINICAL DATA:  Shortness of breath and dysphagia EXAM: PORTABLE CHEST 1 VIEW COMPARISON:  September 10, 2015 FINDINGS: There is new airspace consolidation in the left mid and lower lung zones. There is significant increased consolidation in the right mid lower lung zones. The degree of inspiration shallow. Heart is upper normal in size with pulmonary vascular within normal limits. No adenopathy. No bone lesions. IMPRESSION: Increased consolidation throughout the right mid and lower lung zones. New consolidation left mid and lower lung zones. Shallow inspiration again noted. Electronically Signed   By: Bretta Bang III M.D.   On: 09/11/2015 07:02    Assessment/Plan: Jennifer [name] is a 55 y.o. 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 Prior cxs with VRE and Klebsiella - Admit cx mixed.  Foley changed this admission - she was due to have suprapubic cath placed but declined. Has not been on suppressive abx, gets foley changed q 2 weeks at home.  She had CT scan in June to look for other reasons for recurrent UTI . Flu negative Her fever spiked again so changed to dapto and meropenem and  improving some.  Repeat UCX with > 100 K VRE.  C diff + Worsening resp status - in  unit intubated Recommendations Cont  linezolid for VRE in urine. This will cover gram + in lung as well. Add zosyn given possible HCAP - bal cxs pending and can tailor abx based on these. C diff test + - oral vanco Thank you very much for the consult. Will follow with you.  Jennifer [name]   09/11/2015, 3:03 PM

## 2015-09-11 NOTE — Progress Notes (Signed)
eLink Physician-Brief Progress Note Patient Name: Jennifer [name] DOB: 08/27/1960 MRN: [id number]   Date of Service  09/11/2015  HPI/Events of Note    eICU Interventions  GI prophylaxis> pepcid     Intervention Category Intermediate Interventions: Best-practice therapies (e.g. DVT, beta blocker, etc.)  Max Fickle 09/11/2015, 8:23 PM

## 2015-09-11 NOTE — Progress Notes (Signed)
Called to patients room for low SpO2, patient had tachypnea and was in severe respiratory distress. Placed patient on BIPAP. Dr. Sheryle Hail notified. Patient is getting ready to transport to CCU

## 2015-09-12 DIAGNOSIS — A047 Enterocolitis due to Clostridium difficile: Secondary | ICD-10-CM

## 2015-09-12 LAB — CBC
HEMATOCRIT: 30.3 % — AB (ref 35.0–47.0)
HEMOGLOBIN: 9.7 g/dL — AB (ref 12.0–16.0)
MCH: 30.6 pg (ref 26.0–34.0)
MCHC: 32.1 g/dL (ref 32.0–36.0)
MCV: 95.2 fL (ref 80.0–100.0)
Platelets: 433 10*3/uL (ref 150–440)
RBC: 3.18 MIL/uL — AB (ref 3.80–5.20)
RDW: 15.7 % — ABNORMAL HIGH (ref 11.5–14.5)
WBC: 26.7 10*3/uL — ABNORMAL HIGH (ref 3.6–11.0)

## 2015-09-12 LAB — BASIC METABOLIC PANEL
Anion gap: 5 (ref 5–15)
BUN: 11 mg/dL (ref 6–20)
CHLORIDE: 113 mmol/L — AB (ref 101–111)
CO2: 22 mmol/L (ref 22–32)
Calcium: 7.5 mg/dL — ABNORMAL LOW (ref 8.9–10.3)
Creatinine, Ser: 0.38 mg/dL — ABNORMAL LOW (ref 0.44–1.00)
GFR calc Af Amer: >60 mL/min (ref >60)
GFR calc non Af Amer: >60 mL/min (ref >60)
Glucose, Bld: 118 mg/dL — ABNORMAL HIGH (ref 65–99)
POTASSIUM: 4.2 mmol/L (ref 3.5–5.1)
SODIUM: 140 mmol/L (ref 135–145)

## 2015-09-12 LAB — PHOSPHORUS: PHOSPHORUS: 2.6 mg/dL (ref 2.5–4.6)

## 2015-09-12 LAB — GLUCOSE, CAPILLARY
GLUCOSE-CAPILLARY: 88 mg/dL (ref 65–99)
Glucose-Capillary: 92 mg/dL (ref 65–99)

## 2015-09-12 LAB — MAGNESIUM: MAGNESIUM: 2 mg/dL (ref 1.7–2.4)

## 2015-09-12 LAB — ALBUMIN: Albumin: 1.7 g/dL — ABNORMAL LOW (ref 3.5–5.0)

## 2015-09-12 MED ORDER — CHLORHEXIDINE GLUCONATE 0.12% ORAL RINSE (MEDLINE KIT)
15.0000 mL | Freq: Two times a day (BID) | OROMUCOSAL | Status: DC
  Administered 2015-09-12 – 2015-09-17 (×11): 15 mL via OROMUCOSAL
  Filled 2015-09-12 (×13): qty 15

## 2015-09-12 MED ORDER — VITAL HIGH PROTEIN PO LIQD
1000.0000 mL | ORAL | Status: DC
  Administered 2015-09-12 (×4)
  Administered 2015-09-12: 1000 mL
  Administered 2015-09-12 – 2015-09-13 (×4)
  Administered 2015-09-13: 1000 mL
  Administered 2015-09-13 (×4)
  Administered 2015-09-14: 1000 mL
  Administered 2015-09-15: 11:00:00

## 2015-09-12 MED ORDER — ANTISEPTIC ORAL RINSE SOLUTION (CORINZ)
7.0000 mL | Freq: Four times a day (QID) | OROMUCOSAL | Status: DC
  Administered 2015-09-12 – 2015-09-24 (×46): 7 mL via OROMUCOSAL
  Filled 2015-09-12 (×52): qty 7

## 2015-09-12 MED ORDER — FREE WATER
30.0000 mL | Status: DC
  Administered 2015-09-12 – 2015-09-14 (×12): 30 mL

## 2015-09-12 MED ORDER — DEXMEDETOMIDINE HCL IN NACL 400 MCG/100ML IV SOLN
0.0000 ug/kg/h | INTRAVENOUS | Status: AC
  Administered 2015-09-12: 0.4 ug/kg/h via INTRAVENOUS
  Administered 2015-09-14: 0.5 ug/kg/h via INTRAVENOUS
  Administered 2015-09-15: 0.6 ug/kg/h via INTRAVENOUS
  Filled 2015-09-12 (×4): qty 100
  Filled 2015-09-12: qty 50
  Filled 2015-09-12: qty 100

## 2015-09-12 NOTE — Progress Notes (Signed)
Provided a pastoral presence to husband while wife slept. Said silent prayer. Jefm Petty [phone]

## 2015-09-12 NOTE — Progress Notes (Signed)
Patient continues to be intubated, settings unchanged.  Vital signs stable except still requiring levophed gtt currently at 6mcg/kg/min.  Patient more alert this shift, able to follows commands and nods/shakes head to respond.   Patient received prn fentanyl x1 for signs of pain with improvement.   Foley in place and patent, output decreased dark color urine. Maintenance fluid of NS with KCL continues at 59ml/hr via left IJ TCL without difficulty.  1bm this shift.  Patient currently resting in no apparent distress.  RN continue to monitor.

## 2015-09-12 NOTE — Progress Notes (Signed)
[facility] - [place] at [facility]   PATIENT NAME: Jennifer [name]    MR#:  [id number]  DATE OF BIRTH:  10/20/1960  SUBJECTIVE:  CHIEF COMPLAINT:   Chief Complaint  Patient presents with  . Altered Mental Status   - Patient was on sedation vacation this a.m. SBT trials were made.  Had bronchoscopy yesterday REVIEW OF SYSTEMS:  Review of Systems  Unable to perform ROS: mental acuity    DRUG ALLERGIES:   Allergies  Allergen Reactions  . Bee Venom Anaphylaxis  . Hydrocodone Other (See Comments)    Reaction:  Muscle stiffness   . Lipitor [Atorvastatin] Other (See Comments)    Reaction:  Muscle cramps   . Shellfish Allergy Anaphylaxis  . Oxycodone Other (See Comments)    Reaction:  Muscle stiffness   . Prednisone Other (See Comments)    Reaction:  Psychosis   . Vicodin [Hydrocodone-Acetaminophen] Other (See Comments)    Reaction:  Muscle stiffness   . Oxycontin [Oxycodone Hcl] Other (See Comments)    Reaction:  Muscle stiffness   . Peanut-Containing Drug Products Diarrhea    VITALS:  Blood pressure 115/94, pulse 85, temperature 98.6 F (37 C), temperature source Oral, resp. rate 37, height  (1.575 m), weight 45.8 kg (100 lb 15.5 oz), SpO2 98 %.  PHYSICAL EXAMINATION:  Physical Exam  GENERAL:  55 y.o.-year-old patient lying in the bed with respiratory distress, on BiPAP Patient has ALS. Appears contracted in bed EYES: Pupils equal, round, reactive to light and accommodation. No scleral icterus. HEENT: Head atraumatic, normocephalic. Oropharynx and nasopharynx clear.  NECK:  Supple, no jugular venous distention. No thyroid enlargement, no tenderness.  LUNGS: Diminished and coarse breath sounds bilaterally, positive use of accessory muscles of respiration. Positive rales and rhonchi CARDIOVASCULAR: S1, S2 normal. No murmurs, rubs, or gallops.  ABDOMEN: Soft, nontender, nondistended. Bowel sounds present. No organomegaly or mass.   EXTREMITIES: No pedal edema, cyanosis, or clubbing.  NEUROLOGIC: Lying in bed, contracted appearing, .not following commands, PSYCHIATRIC: Could not be elicited as the patient is on BiPAP  SKIN: No obvious rash, lesion, or ulcer.    LABORATORY PANEL:   CBC  Recent Labs Lab 09/12/15 0343  WBC 26.7*  HGB 9.7*  HCT 30.3*  PLT 433   ------------------------------------------------------------------------------------------------------------------  Chemistries   Recent Labs Lab 09/12/15 0343  NA 140  K 4.2  CL 113*  CO2 22  GLUCOSE 118*  BUN 11  CREATININE 0.38*  CALCIUM 7.5*  MG 2.0   ------------------------------------------------------------------------------------------------------------------  Cardiac Enzymes No results for input(s): TROPONINI in the last 168 hours. ------------------------------------------------------------------------------------------------------------------  RADIOLOGY:  Dg Chest 1 View  09/11/2015  CLINICAL DATA:  Acute respiratory failure. On ventilator. Central line placement. EXAM: CHEST 1 VIEW COMPARISON:  Prior today FINDINGS: New endotracheal tube and nasogastric tube are seen in appropriate position. A new left jugular central venous catheter is seen with tip overlying the mid to lower right atrium. No pneumothorax visualized. Mildly improved aeration of both lungs is seen. Bilateral airspace disease again seen with central perihilar predominance, suspicious for acute pulmonary edema, with pneumonia considered less likely. Probable layering right pleural effusion noted. IMPRESSION: New left jugular central venous catheter tip in mid to lower right atrium. No pneumothorax visualized. Endotracheal tube and nasogastric tube in appropriate position. Improved aeration of both lungs. Bilateral airspace disease with perihilar predominance, suspicious for acute pulmonary edema, with pneumonia considered less likely. Probable layering right pleural  effusion. Electronically Signed   By:  Myles Rosenthal M.D.   On: 09/11/2015 13:56   Dg Abd 1 View  09/11/2015  CLINICAL DATA:  NG tube placement EXAM: ABDOMEN - 1 VIEW COMPARISON:  None. FINDINGS: Supine view of the abdomen at 1320 hours shows contrast material in the right colon. NG tube is visualized with tip in the antral pyloric region of the stomach. The bowel gas pattern is nonspecific. IMPRESSION: NG tube tip is in the distal stomach. Electronically Signed   By: Kennith Center M.D.   On: 09/11/2015 13:54   Dg Chest Port 1 View  09/11/2015  CLINICAL DATA:  Shortness of breath and dysphagia EXAM: PORTABLE CHEST 1 VIEW COMPARISON:  September 10, 2015 FINDINGS: There is new airspace consolidation in the left mid and lower lung zones. There is significant increased consolidation in the right mid lower lung zones. The degree of inspiration shallow. Heart is upper normal in size with pulmonary vascular within normal limits. No adenopathy. No bone lesions. IMPRESSION: Increased consolidation throughout the right mid and lower lung zones. New consolidation left mid and lower lung zones. Shallow inspiration again noted. Electronically Signed   By: Bretta Bang III M.D.   On: 09/11/2015 07:02    EKG:   Orders placed or performed during the hospital encounter of 09/04/15  . ED EKG 12-Lead  . ED EKG 12-Lead    ASSESSMENT AND PLAN:   55 year old female with known history of ALS with chronic Foley catheter and prior history of multiple UTIs, depression, GERD was brought in secondary to altered mental status.   #Acute hypoxic respiratory failure probably secondary to aspiration pneumonia/sepsis and ALS  intubated 09/11/2015 by intensivist Status post bronchoscopy and BAL yesterday-cultures are pending SBT trials today Central line is placed 2/15 On tube feeds Continue current antibiotics Appreciate pulmonologist input   # Metabolic encephalopathy-secondary to sepsis -Continue current IV  antibiotic Zyvox and  Vancomycin, per ID recommendations  Sepsis- VRE UTI , C. difficile and pneumonia now. -ID consult appreciated.  -- Influenza test is negative. -    #3 hypokalemia-known history of chronic hypokalemia and takes potassium supplements at home. Replace as needed   #4 seizure disorder-continue Keppra    #5 ALS-following with neurologist at [facility]. -Bed bound at baseline, but still able to sit with assistance and feed herself and alert and interactive. -Continue to monitor as patient is not close to her baseline. Also takes antidepressants and muscle relaxants for the same. Restart now as more alert and able to take oral meds  #6 Dysphagia- was evaluated by speech therapy and was on dysphagia diet Currently nothing by mouth, ON tube feeds  #7 Severe Malnutrition- dietary consulted Start tube feeds while patient is nothing by mouth and on ventilator  #7 DVT prophylaxis-on Lovenox  #8 C diff- oral vanc. Via tube  #9 Pneumonia, probably from aspiration   Linezolid Discussed with Dr. Sampson Goon ID, await his recommendations   All the records are reviewed and case discussed with Care Management/Social Workerr. Management plans discussed with the patient, family and they are in agreement.  CODE STATUS: Full Code  TOTAL CRITICAL CARE TIME TAKING CARE OF THIS PATIENT: 35 minutes.  Spoke to her husband bedside POSSIBLE D/C IN ? DAYS, DEPENDING ON CLINICAL CONDITION.   Ramonita Lab M.D on 09/12/2015 at 3:53 PM  Between 7am to 6pm - Pager - [phone]  After 6pm go to [email or web address] - password [email or web address] [facility]  [place] [facility]  Office  [phone]  CC: Primary care physician; Ruthe Mannan,  MD

## 2015-09-12 NOTE — Progress Notes (Signed)
Nutrition Follow-up  DOCUMENTATION CODES:   Severe malnutrition in context of chronic illness  INTERVENTION:   EN: MD Mungal agreeable to initiation of TF in rounds today. Will recommend starting Vital High Protein via TF protocol with goal rate of 25mL/hr to provide 1080kcals and 95g protein with minimal free water flushes as pt remains on IVF at this time (total free water of of free water). Will continue to follow and make adjustments accordingly.    NUTRITION DIAGNOSIS:   Inadequate oral intake related to acute illness as evidenced by NPO status. Being addressed with TF initiation.  GOAL:   Patient will meet greater than or equal to 90% of their needs; ongoing  MONITOR:    (Energy intake)  REASON FOR ASSESSMENT:   Consult Enteral/tube feeding initiation and management  ASSESSMENT:    Pt with hypoxia, failed bipap subsequently intubated and s/p bronch yesterday. Pt remains intubated this am. Surgery consulted to possible rectal prolapse, however thought to be 3 edematous hemorrhoids. Pt positive for c.diff antigen remains on enteric precautions.  Diet Order:   NPO   Current Nutrition: NPO as pt intubated on the vent   Gastrointestinal Profile: Last BM: 09/12/2015, per RN Brittney stool is mucous colored   Scheduled Medications:  . antiseptic oral rinse  7 mL Mouth Rinse QID  . chlorhexidine gluconate  15 mL Mouth Rinse BID  . enoxaparin (LOVENOX) injection  30 mg Subcutaneous Q24H  . famotidine (PEPCID) IV  20 mg Intravenous Q12H  . feeding supplement (VITAL HIGH PROTEIN)  1,000 mL Per Tube Q24H  . levETIRAcetam  500 mg Intravenous Q12H  . linezolid (ZYVOX) IV  600 mg Intravenous Q12H  . midazolam  4 mg Intravenous Once  . phenylephrine  1 suppository Rectal BID  . piperacillin-tazobactam (ZOSYN)  IV  3.375 g Intravenous 3 times per day  . tiZANidine  2 mg Oral QHS  . vancomycin  125 mg Oral 4 times per day    Continuous Medications:  . 0.9 % NaCl  with KCl 20 mEq / L 50 mL/hr (09/10/15 2156)  . dexmedetomidine 0.4 mcg/kg/hr (09/12/15 1105)  . norepinephrine (LEVOPHED) Adult infusion 1.5 mcg/min (09/12/15 1313)     Electrolyte/Renal Profile and Glucose Profile:   Recent Labs Lab 09/10/15 0618 09/11/15 0532 09/12/15 0343  NA 142 142 140  K 4.0 4.1 4.2  CL 110 114* 113*  CO2 16* 24 22  BUN CREATININE 0.38* 0.30* 0.38*  CALCIUM 7.2* 7.6* 7.5*  MG 1.8 2.1 2.0  PHOS 2.2* 2.9 2.6  GLUCOSE 77 82 118*   Protein Profile:   Recent Labs Lab 09/12/15 0343  ALBUMIN 1.7*    Weight Trend since Admission: Filed Weights   09/11/15 0649 09/12/15 0500 09/12/15 0532  Weight: 95 lb 0.3 oz (43.1 kg) 100 lb 15.5 oz (45.8 kg) 100 lb 15.5 oz (45.8 kg)    BMI:  Body mass index is 18.46 kg/(m^2).  Estimated Nutritional Needs:   Kcal:  1123kcals, (BEE: 1011kcals, Ve: 6, Tmax: 37) using [employer] Equation and current weight of 45.8kg  Protein:  69-92g protein (1.5-2.0g/kg)  Fluid:  (25-82ml//kg) 900-1038ml/d  EDUCATION NEEDS:   No education needs identified at this time   HIGH Care Level  Leda Quail, RD, LDN Pager [phone] Weekend/On-Call Pager [phone]

## 2015-09-12 NOTE — Progress Notes (Signed)
PHARMACY - CRITICAL CARE PROGRESS NOTE  Pharmacy Consult for electrolyte management      Allergies  Allergen Reactions  . Bee Venom Anaphylaxis  . Hydrocodone Other (See Comments)    Reaction:  Muscle stiffness   . Lipitor [Atorvastatin] Other (See Comments)    Reaction:  Muscle cramps   . Shellfish Allergy Anaphylaxis  . Oxycodone Other (See Comments)    Reaction:  Muscle stiffness   . Prednisone Other (See Comments)    Reaction:  Psychosis   . Vicodin [Hydrocodone-Acetaminophen] Other (See Comments)    Reaction:  Muscle stiffness   . Oxycontin [Oxycodone Hcl] Other (See Comments)    Reaction:  Muscle stiffness   . Peanut-Containing Drug Products Diarrhea    Patient Measurements: Height:  (157.5 cm) Weight: 100 lb 15.5 oz (45.8 kg) IBW/kg (Calculated) : 50.1  Vital Signs: Temp: 98.6 F (37 C) (02/16 0300) Temp Source: Oral (02/16 0300) BP: 115/94 mmHg (02/16 1000) Pulse Rate: 85 (02/16 1000) Intake/Output from previous day: 02/15 0701 - 02/16 0700 In: 1876.5 [I.V.:866.5; IV Piggyback:1010] Out: 450 [Urine:450] Intake/Output from this shift: Total I/O In: 455 [IV Piggyback:455] Out: -  Vent settings for last 24 hours: Vent Mode:  [-] PRVC FiO2 (%):  [35 %-65 %] 50 % Set Rate:  [15 bmp] 15 bmp Vt Set:  [400 mL] 400 mL PEEP:  [5 cmH20] 5 cmH20 Plateau Pressure:  [20 cmH20] 20 cmH20  Labs:  Recent Labs  09/10/15 0618 09/11/15 0532 09/12/15 0343  WBC  --  23.7* 26.7*  HGB  --  11.0* 9.7*  HCT  --  32.6* 30.3*  PLT  --  410 433  CREATININE 0.38* 0.30* 0.38*  MG 1.8 2.1 2.0  PHOS 2.2* 2.9 2.6  ALBUMIN  --   --  1.7*   Estimated Creatinine Clearance: 58.1 mL/min (by C-G formula based on Cr of 0.38).  No results for input(s): GLUCAP in the last 72 hours.  Microbiology: Recent Results (from the past 720 hour(s))  Urine culture     Status: None   Collection Time: 09/04/15  9:01 PM  Result Value Ref Range Status   Specimen Description URINE,  RANDOM  Final   Special Requests NONE  Final   Culture MULTIPLE SPECIES PRESENT, SUGGEST RECOLLECTION  Final   Report Status 09/06/2015 FINAL  Final  Blood Culture (routine x 2)     Status: None   Collection Time: 09/04/15  9:41 PM  Result Value Ref Range Status   Specimen Description BLOOD RIGHT ARM  Final   Special Requests BOTTLES DRAWN AEROBIC AND ANAEROBIC  Final   Culture NO GROWTH 5 DAYS  Final   Report Status 09/09/2015 FINAL  Final  Blood Culture (routine x 2)     Status: None   Collection Time: 09/04/15  9:41 PM  Result Value Ref Range Status   Specimen Description BLOOD LEFT ARM  Final   Special Requests BOTTLES DRAWN AEROBIC AND ANAEROBIC  Final   Culture NO GROWTH 5 DAYS  Final   Report Status 09/09/2015 FINAL  Final  Urine culture     Status: None   Collection Time: 09/06/15  4:58 PM  Result Value Ref Range Status   Specimen Description URINE, CATHETERIZED  Final   Special Requests ertapenem and vanco Normal  Final   Culture   Final    >=100,000 COLONIES/mL VANCOMYCIN RESISTANT ENTEROCOCCUS ENTEROCOCCUS FAECIUM VRE HAVE INTRINSIC RESISTANCE TO MOST COMMONLY USED ANTIBIOTICS AND THE ABILITY TO  ACQUIRE RESISTANCE TO MOST AVAILABLE ANTIBIOTICS. WITH MIXED BACTERIAL ORGANISMS    Report Status 09/10/2015 FINAL  Final   Organism ID, Bacteria VANCOMYCIN RESISTANT ENTEROCOCCUS  Final      Susceptibility   Vancomycin resistant enterococcus - MIC*    AMPICILLIN >=32 RESISTANT Resistant     LEVOFLOXACIN >=8 RESISTANT Resistant     NITROFURANTOIN 128 RESISTANT Resistant     VANCOMYCIN >=32 RESISTANT Resistant     LINEZOLID 2 SENSITIVE Sensitive     * >=100,000 COLONIES/mL VANCOMYCIN RESISTANT ENTEROCOCCUS  C difficile quick scan w PCR reflex     Status: Abnormal   Collection Time: 09/09/15  4:14 PM  Result Value Ref Range Status   C Diff antigen POSITIVE (A) NEGATIVE Final   C Diff toxin NEGATIVE NEGATIVE Final   C Diff interpretation   Final    Positive for  toxigenic C. difficile, active toxin production not detected. Patient has toxigenic C. difficile organisms present in the bowel, but toxin was not detected. The patient may be a carrier or the level of toxin in the sample was below the limit  of detection. This information should be used in conjunction with the patient's clinical history when deciding on possible therapy.     Comment: CRITICAL RESULT CALLED TO, READ BACK BY AND VERIFIED WITH: Burnett Corrente ON 09/09/15 AT 2019 BY TLB   Clostridium Difficile by PCR     Status: Abnormal   Collection Time: 09/09/15  4:14 PM  Result Value Ref Range Status   Toxigenic C Difficile by pcr POSITIVE (A) NEGATIVE Final    Comment: CRITICAL RESULT CALLED TO, READ BACK BY AND VERIFIED WITH: Burnett Corrente ON 09/09/15 AT 2019 BY TLB   MRSA PCR Screening     Status: None   Collection Time: 09/11/15  6:52 AM  Result Value Ref Range Status   MRSA by PCR NEGATIVE NEGATIVE Final    Comment:        The GeneXpert MRSA Assay (FDA approved for NASAL specimens only), is one component of a comprehensive MRSA colonization surveillance program. It is not intended to diagnose MRSA infection nor to guide or monitor treatment for MRSA infections.   Culture, bal-quantitative     Status: None (Preliminary result)   Collection Time: 09/11/15  2:21 PM  Result Value Ref Range Status   Specimen Description BRONCHIAL ALVEOLAR LAVAGE  Final   Special Requests Normal BRONCHIAL BRUSHING  Final   Gram Stain PENDING  Incomplete   Culture Consistent with normal respiratory flora.  Final   Report Status PENDING  Incomplete  Fungus Culture with Smear     Status: None (Preliminary result)   Collection Time: 09/11/15  2:21 PM  Result Value Ref Range Status   Specimen Description BRONCHIAL BRUSHING  Final   Special Requests Normal  Final   Culture NO GROWTH < 24 HOURS  Final   Report Status PENDING  Incomplete    Medications:  Scheduled:  . antiseptic oral rinse  7 mL  Mouth Rinse QID  . chlorhexidine gluconate  15 mL Mouth Rinse BID  . enoxaparin (LOVENOX) injection  30 mg Subcutaneous Q24H  . famotidine (PEPCID) IV  20 mg Intravenous Q12H  . levETIRAcetam  500 mg Intravenous Q12H  . linezolid (ZYVOX) IV  600 mg Intravenous Q12H  . midazolam  4 mg Intravenous Once  . phenylephrine  1 suppository Rectal BID  . piperacillin-tazobactam (ZOSYN)  IV  3.375 g Intravenous 3 times per day  . tiZANidine  2 mg Oral QHS  . vancomycin  125 mg Oral 4 times per day   Infusions:  . 0.9 % NaCl with KCl 20 mEq / L 50 mL/hr (09/10/15 2156)  . dexmedetomidine 0.4 mcg/kg/hr (09/12/15 1105)  . norepinephrine (LEVOPHED) Adult infusion 1 mcg/min (09/12/15 1124)    Assessment: Pharmacy consulted for electrolyte management for 55 yo female ICU patient requiring mechanical ventilation.     Plan:  Electrolytes are WNL. Will order follow up electrolytes with am labs.   Pharmacy will continue to monitor and adjust per consult.    Luisa Hart D 09/12/2015,11:52 AM

## 2015-09-12 NOTE — Progress Notes (Signed)
Pharmacy Antibiotic Note  Jennifer [name] is a 55 y.o. female admitted on 09/04/2015 with UTI/sepsis.  Pharmacy has been consulted for Zosyn dosing per ID for possible HAP/Aspiration PNA. Patient with urine culture growing VRE.   Plan:  Continue Zosyn EI 3.375g IV Q8hr. Will continue to follow for signs of infection resolution.  Height:  (157.5 cm) Weight: 100 lb 15.5 oz (45.8 kg) IBW/kg (Calculated) : 50.1  Temp (24hrs), Avg:98 F (36.7 C), Min:97.4 F (36.3 C), Max:98.6 F (37 C)   Recent Labs Lab 09/06/15 0513  09/08/15 0457 09/09/15 0427 09/10/15 0618 09/11/15 0532 09/12/15 0343  WBC 15.2*  --  14.8*  --   --  23.7* 26.7*  CREATININE 0.58  < > 0.48 0.37* 0.38* 0.30* 0.38*  < > = values in this interval not displayed.  Estimated Creatinine Clearance: 58.1 mL/min (by C-G formula based on Cr of 0.38).    Allergies  Allergen Reactions  . Bee Venom Anaphylaxis  . Hydrocodone Other (See Comments)    Reaction:  Muscle stiffness   . Lipitor [Atorvastatin] Other (See Comments)    Reaction:  Muscle cramps   . Shellfish Allergy Anaphylaxis  . Oxycodone Other (See Comments)    Reaction:  Muscle stiffness   . Prednisone Other (See Comments)    Reaction:  Psychosis   . Vicodin [Hydrocodone-Acetaminophen] Other (See Comments)    Reaction:  Muscle stiffness   . Oxycontin [Oxycodone Hcl] Other (See Comments)    Reaction:  Muscle stiffness   . Peanut-Containing Drug Products Diarrhea   Antibiotics:  Zosyn 02/08 >> 02/08 Vancomycin 02/08 >> 02/10 Ertapenem 02/09 >> 02/10 Daptomycin 02/10 >> 02/13 Meropenem 02/10 >> 02/12 Metronidazole 02/13 >> 02/14 Linezolid 02/14 >> Oral vancomycin 02/14 >> Zosyn 02/15 >>  Microbiology: 02/08 Urine cx: multiple species 02/08 Blood cx: NG 02/10 Urine cx: VRE 02/13 C diff: positive 02/15 MRSA PCR: negative 02/15 BAL: pending 02/15 Fungal: NGTD 02/15 Viral: pending   Pharmacy will continue to monitor and adjust per consult.    Luisa Hart D 09/12/2015 11:35 AM

## 2015-09-12 NOTE — Progress Notes (Signed)
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 Developed labored breathing requiring BiPAP with subsequent intubation, had a bronchoscopy with BAL, currently being monitored in ICU.  SUBJECTIVE:  No acute events overnight, patient becoming more awake and alert today, still with tachypnea requiring mild sedation.  STUDIES:    SIGNIFICANT EVENTS: 2/15>> intubated, bronchoscopy BAL   VITAL SIGNS: Temp:  [97.4 F (36.3 C)-98.6 F (37 C)] 98.6 F (37 C) (02/16 0300) Pulse Rate:  [64-100] 85 (02/16 1000) Resp:  [15-37] 37 (02/16 1000) BP: (63-154)/(50-94) 115/94 mmHg (02/16 1000) SpO2:  [89 %-100 %] 98 % (02/16 1000) FiO2 (%):  [35 %-65 %] 50 % (02/16 1127) Weight:  [100 lb 15.5 oz (45.8 kg)] 100 lb 15.5 oz (45.8 kg) (02/16 0532) HEMODYNAMICS:   VENTILATOR SETTINGS: Vent Mode:  [-] PRVC FiO2 (%):  [35 %-65 %] 50 % Set Rate:  [15 bmp] 15 bmp Vt Set:  [400 mL] 400 mL PEEP:  [5 cmH20] 5 cmH20 Plateau Pressure:  [20 cmH20] 20 cmH20 INTAKE / OUTPUT:  Intake/Output Summary (Last 24 hours) at 09/12/15 1143 Last data filed at 09/12/15 1105  Gross per 24 hour  Intake 2226.5 ml  Output    450 ml  Net 1776.5 ml    Review of Systems  Unable to perform ROS: intubated    Physical Exam  Constitutional:  Cachectic appearance  HENT:  Head: Normocephalic and atraumatic.  Right Ear: External ear normal.  Left Ear: External ear normal.  Eyes: Conjunctivae and EOM are normal. Pupils are equal, round, and reactive to light.  Neck: Normal range of motion. Neck supple. No thyromegaly present.  Cardiovascular: Normal rate, regular rhythm, normal heart sounds and intact distal pulses.   No murmur heard. Pulmonary/Chest:  Into beta,  mild shallow breath sounds at the bases, no significant adventitious sounds  Abdominal: Soft. Bowel sounds are normal. She exhibits no distension.  Musculoskeletal: She exhibits no edema.  Neurological:  Intubated and sedated   Skin: Skin is warm and dry.  Nursing note and vitals reviewed.    LABS:  CBC  Recent Labs Lab 09/08/15 0457 09/11/15 0532 09/12/15 0343  WBC 14.8* 23.7* 26.7*  HGB 9.0* 11.0* 9.7*  HCT 27.0* 32.6* 30.3*  PLT 266 410 433   Coag's No results for input(s): APTT, INR in the last 168 hours. BMET  Recent Labs Lab 09/10/15 0618 09/11/15 0532 09/12/15 0343  NA 142 142 140  K 4.0 4.1 4.2  CL 110 114* 113*  CO2 16* 24 22  BUN 9 10 11   CREATININE 0.38* 0.30* 0.38*  GLUCOSE 77 82 118*   Electrolytes  Recent Labs Lab 09/10/15 0618 09/11/15 0532 09/12/15 0343  CALCIUM 7.2* 7.6* 7.5*  MG 1.8 2.1 2.0  PHOS 2.2* 2.9 2.6   Sepsis Markers No results for input(s): LATICACIDVEN, PROCALCITON, O2SATVEN in the last 168 hours. ABG  Recent Labs Lab 09/11/15 0916 09/11/15 1759  PHART 7.44 7.36  PCO2ART 34 43  PO2ART 67* 67*   Liver Enzymes  Recent Labs Lab 09/12/15 0343  ALBUMIN 1.7*   Cardiac Enzymes No results for input(s): TROPONINI, PROBNP in the last 168 hours. Glucose No results for input(s): GLUCAP in the last 168 hours.  Imaging Dg Chest 1 View  09/11/2015  CLINICAL  DATA:  Acute respiratory failure. On ventilator. Central line placement. EXAM: CHEST 1 VIEW COMPARISON:  Prior today FINDINGS: New endotracheal tube and nasogastric tube are seen in appropriate position. A new left jugular central venous catheter is seen with tip overlying the mid to lower right atrium. No pneumothorax visualized. Mildly improved aeration of both lungs is seen. Bilateral airspace disease again seen with central perihilar predominance, suspicious for acute pulmonary edema, with pneumonia considered less likely. Probable layering right pleural effusion  noted. IMPRESSION: New left jugular central venous catheter tip in mid to lower right atrium. No pneumothorax visualized. Endotracheal tube and nasogastric tube in appropriate position. Improved aeration of both lungs. Bilateral airspace disease with perihilar predominance, suspicious for acute pulmonary edema, with pneumonia considered less likely. Probable layering right pleural effusion. Electronically Signed   By: Myles Rosenthal M.D.   On: 09/11/2015 13:56   Dg Abd 1 View  09/11/2015  CLINICAL DATA:  NG tube placement EXAM: ABDOMEN - 1 VIEW COMPARISON:  None. FINDINGS: Supine view of the abdomen at 1320 hours shows contrast material in the right colon. NG tube is visualized with tip in the antral pyloric region of the stomach. The bowel gas pattern is nonspecific. IMPRESSION: NG tube tip is in the distal stomach. Electronically Signed   By: Kennith Center M.D.   On: 09/11/2015 13:54    Cultures: BCx2  UC 2/10-VRE Sputum MRSA PCR negative C. Difficile-antigen positive Influenza panel negative   Antibiotics: Vancomycin linezolid Lines: [facility] CVL 2/15>>  ASSESSMENT / PLAN: 55 year old female past medical history of ALS, chronic Foley catheter, recurrent UTI,initially admitted on 2/8 weakness, hallucination, secondary to possible GI sepsis, hospitalization complicated with C. Difficile, respiratory failure requiring intubation, now with slow respiratory improvement.    PULMONARY OETT 8.0 Respiratory failure-ventilator dependent ALS Sepsis Right lower lobe atelectasis/pneumonia P:  -continue mechanical ventilation, wean as tolerated, keep O2 saturations greater than 80% -SBT/WUA daily -more awake and alert today, possibly another 24-48hrs of intubation.  -ABG as needed, chest x-ray as needed  -s/p bronch with BAL on 2/15, no significant findings.  - intermittent periods of tachypnea (40s), may require low dose precedex for agitation  CARDIOVASCULAR CVL [facility] Continue to monitor  hemodynamics,    RENAL A: Chronic indwelling Foley catheter, chronic urinary tract infection, VRE P:  -continue antibiotics as directed by infectious disease - ICU electrolyte replacement protocol -Monitor renal function, avoid nephrotoxic drugs  GASTROINTESTINAL GERD-continue PPI Protein malnutrition-start tube feeds  INFECTIOUS UTI-VRE Positive for C. Difficile antigen P:  - continue antibiotics as directed by infectious disease, currently on linezolid and oral vancomycin  ENDOCRINE -ICU hypo/ hyperglycemic protocol  NEUROLOGIC ALS Seizure disorder P:  RASS goal:- 1 -continue with ALS medications, bed bound at baseline, able to sit without assistance, able to verbally communicate at baseline. - currently since she is on linezolid her nuedexta is on hold secondary to drug-drug interaction causing serotonin syndrome -Continue with antidepressants and muscle relaxants -Continue her Keppra -low dose precedex for mild agitation       Thank you for consulting [facility], Please feel free to contacts [place] with any questions at [phone] (please enter 7-digits).  I have personally obtained a history, examined the patient, evaluated laboratory and imaging results, formulated the assessment and plan and placed orders.  The Patient requires high complexity decision making for assessment and support, frequent evaluation and titration of therapies, application of advanced monitoring technologies and extensive interpretation of multiple databases. Critical  Care Time devoted to patient care services described in this note is 35 minutes.   Overall, patient is critically ill, prognosis is guarded. Patient at high risk for cardiac arrest and death.    Stephanie Acre, MD [facility] Pager [phone] (please enter 7-digits) On Call Pager - [phone] (please enter 7-digits)  Note: This note was prepared with Dragon  dictation along with smaller phrase technology. Any transcriptional errors that result from this process are unintentional.

## 2015-09-12 NOTE — Care Management (Signed)
Patient with ALS, transferred to icu from 1A. for increasing respiratory distress.  Failed continuous bipap.  Required intubation.  She is a full code.  Currently on full ventilator support

## 2015-09-13 ENCOUNTER — Inpatient Hospital Stay: Payer: BLUE CROSS/BLUE SHIELD

## 2015-09-13 LAB — GLUCOSE, CAPILLARY
GLUCOSE-CAPILLARY: 115 mg/dL — AB (ref 65–99)
GLUCOSE-CAPILLARY: 100 mg/dL — AB (ref 65–99)
Glucose-Capillary: 114 mg/dL — ABNORMAL HIGH (ref 65–99)
Glucose-Capillary: 147 mg/dL — ABNORMAL HIGH (ref 65–99)
Glucose-Capillary: 197 mg/dL — ABNORMAL HIGH (ref 65–99)
Glucose-Capillary: 95 mg/dL (ref 65–99)

## 2015-09-13 LAB — BASIC METABOLIC PANEL
Anion gap: 4 — ABNORMAL LOW (ref 5–15)
BUN: 15 mg/dL (ref 6–20)
CHLORIDE: 111 mmol/L (ref 101–111)
CO2: 25 mmol/L (ref 22–32)
CREATININE: 0.4 mg/dL — AB (ref 0.44–1.00)
Calcium: 7.4 mg/dL — ABNORMAL LOW (ref 8.9–10.3)
GFR calc Af Amer: >60 mL/min (ref >60)
GFR calc non Af Amer: >60 mL/min (ref >60)
GLUCOSE: 136 mg/dL — AB (ref 65–99)
Potassium: 3.9 mmol/L (ref 3.5–5.1)
Sodium: 140 mmol/L (ref 135–145)

## 2015-09-13 LAB — CULTURE, BAL-QUANTITATIVE W GRAM STAIN
Culture: NORMAL
Special Requests: NORMAL

## 2015-09-13 LAB — CBC
HCT: 28.8 % — ABNORMAL LOW (ref 35.0–47.0)
Hemoglobin: 9.4 g/dL — ABNORMAL LOW (ref 12.0–16.0)
MCH: 30.8 pg (ref 26.0–34.0)
MCHC: 32.7 g/dL (ref 32.0–36.0)
MCV: 94.3 fL (ref 80.0–100.0)
PLATELETS: 440 10*3/uL (ref 150–440)
RBC: 3.05 MIL/uL — ABNORMAL LOW (ref 3.80–5.20)
RDW: 15.7 % — AB (ref 11.5–14.5)
WBC: 17.1 10*3/uL — ABNORMAL HIGH (ref 3.6–11.0)

## 2015-09-13 LAB — CULTURE, BAL-QUANTITATIVE

## 2015-09-13 MED ORDER — SODIUM CHLORIDE 0.9 % IV BOLUS (SEPSIS)
250.0000 mL | Freq: Once | INTRAVENOUS | Status: AC
  Administered 2015-09-13: 250 mL via INTRAVENOUS

## 2015-09-13 MED ORDER — ENOXAPARIN SODIUM 40 MG/0.4ML ~~LOC~~ SOLN
40.0000 mg | SUBCUTANEOUS | Status: DC
  Administered 2015-09-14 – 2015-09-23 (×11): 40 mg via SUBCUTANEOUS
  Filled 2015-09-13 (×12): qty 0.4

## 2015-09-13 MED ORDER — SODIUM CHLORIDE 0.9 % IV BOLUS (SEPSIS)
500.0000 mL | Freq: Once | INTRAVENOUS | Status: AC
  Administered 2015-09-13: 500 mL via INTRAVENOUS

## 2015-09-13 MED ORDER — SODIUM CHLORIDE 0.9 % IV SOLN
INTRAVENOUS | Status: DC
  Administered 2015-09-13: 20:00:00 via INTRAVENOUS
  Administered 2015-09-13: 100 mL/h via INTRAVENOUS

## 2015-09-13 MED ORDER — DIAZEPAM 2 MG PO TABS
10.0000 mg | ORAL_TABLET | Freq: Four times a day (QID) | ORAL | Status: DC | PRN
  Administered 2015-09-13 – 2015-09-22 (×17): 10 mg via ORAL
  Filled 2015-09-13 (×8): qty 2
  Filled 2015-09-13: qty 5
  Filled 2015-09-13 (×9): qty 2

## 2015-09-13 NOTE — Progress Notes (Signed)
eLink Physician-Brief Progress Note Patient Name: Jennifer [name] DOB: 05-17-1961 MRN: [id number]   Date of Service  09/13/2015  HPI/Events of Note  Patient has removed OG tube twice Awake on vent  eICU Interventions  Replace OG Increase sedation     Intervention Category Minor Interventions: Routine modifications to care plan (e.g. PRN medications for pain, fever)  Max Fickle 09/13/2015, 7:39 PM

## 2015-09-13 NOTE — Progress Notes (Signed)
PHARMACY - CRITICAL CARE PROGRESS NOTE  Pharmacy Consult for electrolyte management      Allergies  Allergen Reactions  . Bee Venom Anaphylaxis  . Hydrocodone Other (See Comments)    Reaction:  Muscle stiffness   . Lipitor [Atorvastatin] Other (See Comments)    Reaction:  Muscle cramps   . Shellfish Allergy Anaphylaxis  . Oxycodone Other (See Comments)    Reaction:  Muscle stiffness   . Prednisone Other (See Comments)    Reaction:  Psychosis   . Vicodin [Hydrocodone-Acetaminophen] Other (See Comments)    Reaction:  Muscle stiffness   . Oxycontin [Oxycodone Hcl] Other (See Comments)    Reaction:  Muscle stiffness   . Peanut-Containing Drug Products Diarrhea    Patient Measurements: Height:  (157.5 cm) Weight: 108 lb 11 oz (49.3 kg) IBW/kg (Calculated) : 50.1  Vital Signs: Temp: 98.1 F (36.7 C) (02/17 0400) Temp Source: Axillary (02/17 0400) BP: 118/63 mmHg (02/17 0600) Pulse Rate: 73 (02/17 0600) Intake/Output from previous day: 02/16 0701 - 02/17 0700 In: 2334 [I.V.:1366.3; NG/GT:462.7; IV Piggyback:505] Out: 550 [Urine:550] Intake/Output from this shift: Total I/O In: 1020.3 [I.V.:670.3; NG/GT:350] Out: 125 [Urine:125] Vent settings for last 24 hours: Vent Mode:  [-] PRVC FiO2 (%):  [30 %-50 %] 30 % Set Rate:  [15 bmp] 15 bmp Vt Set:  [400 mL] 400 mL PEEP:  [5 cmH20] 5 cmH20 Plateau Pressure:  [20 cmH20] 20 cmH20  Labs:  Recent Labs  09/11/15 0532 09/12/15 0343 09/13/15 0505  WBC 23.7* 26.7* 17.1*  HGB 11.0* 9.7* 9.4*  HCT 32.6* 30.3* 28.8*  PLT 410 433 440  CREATININE 0.30* 0.38* 0.40*  MG 2.1 2.0  --   PHOS 2.9 2.6  --   ALBUMIN  --  1.7*  --    Estimated Creatinine Clearance: 62.6 mL/min (by C-G formula based on Cr of 0.4).   Recent Labs  09/12/15 1930 09/12/15 2357 09/13/15 0403  GLUCAP 88 92 147*    Microbiology: Recent Results (from the past 720 hour(s))  Urine culture     Status: None   Collection Time: 09/04/15  9:01 PM   Result Value Ref Range Status   Specimen Description URINE, RANDOM  Final   Special Requests NONE  Final   Culture MULTIPLE SPECIES PRESENT, SUGGEST RECOLLECTION  Final   Report Status 09/06/2015 FINAL  Final  Blood Culture (routine x 2)     Status: None   Collection Time: 09/04/15  9:41 PM  Result Value Ref Range Status   Specimen Description BLOOD RIGHT ARM  Final   Special Requests BOTTLES DRAWN AEROBIC AND ANAEROBIC  Final   Culture NO GROWTH 5 DAYS  Final   Report Status 09/09/2015 FINAL  Final  Blood Culture (routine x 2)     Status: None   Collection Time: 09/04/15  9:41 PM  Result Value Ref Range Status   Specimen Description BLOOD LEFT ARM  Final   Special Requests BOTTLES DRAWN AEROBIC AND ANAEROBIC  Final   Culture NO GROWTH 5 DAYS  Final   Report Status 09/09/2015 FINAL  Final  Urine culture     Status: None   Collection Time: 09/06/15  4:58 PM  Result Value Ref Range Status   Specimen Description URINE, CATHETERIZED  Final   Special Requests ertapenem and vanco Normal  Final   Culture   Final    >=100,000 COLONIES/mL VANCOMYCIN RESISTANT ENTEROCOCCUS ENTEROCOCCUS FAECIUM VRE HAVE INTRINSIC RESISTANCE TO MOST COMMONLY USED ANTIBIOTICS  AND THE ABILITY TO ACQUIRE RESISTANCE TO MOST AVAILABLE ANTIBIOTICS. WITH MIXED BACTERIAL ORGANISMS    Report Status 09/10/2015 FINAL  Final   Organism ID, Bacteria VANCOMYCIN RESISTANT ENTEROCOCCUS  Final      Susceptibility   Vancomycin resistant enterococcus - MIC*    AMPICILLIN >=32 RESISTANT Resistant     LEVOFLOXACIN >=8 RESISTANT Resistant     NITROFURANTOIN 128 RESISTANT Resistant     VANCOMYCIN >=32 RESISTANT Resistant     LINEZOLID 2 SENSITIVE Sensitive     * >=100,000 COLONIES/mL VANCOMYCIN RESISTANT ENTEROCOCCUS  C difficile quick scan w PCR reflex     Status: Abnormal   Collection Time: 09/09/15  4:14 PM  Result Value Ref Range Status   C Diff antigen POSITIVE (A) NEGATIVE Final   C Diff toxin NEGATIVE  NEGATIVE Final   C Diff interpretation   Final    Positive for toxigenic C. difficile, active toxin production not detected. Patient has toxigenic C. difficile organisms present in the bowel, but toxin was not detected. The patient may be a carrier or the level of toxin in the sample was below the limit  of detection. This information should be used in conjunction with the patient's clinical history when deciding on possible therapy.     Comment: CRITICAL RESULT CALLED TO, READ BACK BY AND VERIFIED WITH: Burnett Corrente ON 09/09/15 AT 2019 BY TLB   Clostridium Difficile by PCR     Status: Abnormal   Collection Time: 09/09/15  4:14 PM  Result Value Ref Range Status   Toxigenic C Difficile by pcr POSITIVE (A) NEGATIVE Final    Comment: CRITICAL RESULT CALLED TO, READ BACK BY AND VERIFIED WITH: Burnett Corrente ON 09/09/15 AT 2019 BY TLB   MRSA PCR Screening     Status: None   Collection Time: 09/11/15  6:52 AM  Result Value Ref Range Status   MRSA by PCR NEGATIVE NEGATIVE Final    Comment:        The GeneXpert MRSA Assay (FDA approved for NASAL specimens only), is one component of a comprehensive MRSA colonization surveillance program. It is not intended to diagnose MRSA infection nor to guide or monitor treatment for MRSA infections.   Culture, bal-quantitative     Status: None (Preliminary result)   Collection Time: 09/11/15  2:21 PM  Result Value Ref Range Status   Specimen Description BRONCHIAL ALVEOLAR LAVAGE  Final   Special Requests Normal BRONCHIAL BRUSHING  Final   Gram Stain   Final    FEW WBC SEEN RARE SQUAMOUS EPITHELIAL CELLS PRESENT NO ORGANISMS SEEN    Culture Consistent with normal respiratory flora.  Final   Report Status PENDING  Incomplete  Fungus Culture with Smear     Status: None (Preliminary result)   Collection Time: 09/11/15  2:21 PM  Result Value Ref Range Status   Specimen Description BRONCHIAL BRUSHING  Final   Special Requests Normal  Final    Culture NO GROWTH < 24 HOURS  Final   Report Status PENDING  Incomplete    Medications:  Scheduled:  . antiseptic oral rinse  7 mL Mouth Rinse QID  . chlorhexidine gluconate  15 mL Mouth Rinse BID  . enoxaparin (LOVENOX) injection  30 mg Subcutaneous Q24H  . famotidine (PEPCID) IV  20 mg Intravenous Q12H  . feeding supplement (VITAL HIGH PROTEIN)  1,000 mL Per Tube Q24H  . free water  30 mL Per Tube Q4H  . levETIRAcetam  500 mg Intravenous Q12H  .  linezolid (ZYVOX) IV  600 mg Intravenous Q12H  . midazolam  4 mg Intravenous Once  . phenylephrine  1 suppository Rectal BID  . piperacillin-tazobactam (ZOSYN)  IV  3.375 g Intravenous 3 times per day  . tiZANidine  2 mg Oral QHS  . vancomycin  125 mg Oral 4 times per day   Infusions:  . 0.9 % NaCl with KCl 20 mEq / L 50 mL/hr at 09/13/15 0600  . dexmedetomidine 0.402 mcg/kg/hr (09/13/15 0600)  . norepinephrine (LEVOPHED) Adult infusion 2 mcg/min (09/13/15 0600)    Assessment: Pharmacy consulted for electrolyte management for 55 yo female ICU patient requiring mechanical ventilation.     Plan:  Electrolytes are WNL. Will order follow up electrolytes with am labs.   Pharmacy will continue to monitor and adjust per consult.    Carola Frost, Pharm.D., BCPS Clinical Pharmacist 09/13/2015,6:20 AM

## 2015-09-13 NOTE — Progress Notes (Addendum)
Pt partially removed ng tube. Tube re-inserted by RN, elink notified. Dr. Kendrick Fries ordered abd xray for line verification.

## 2015-09-13 NOTE — Progress Notes (Signed)
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 Developed labored breathing requiring BiPAP with subsequent intubation, had a bronchoscopy with BAL, currently being monitored in ICU.  SUBJECTIVE:  No acute events overnight, patient becoming more awake and alert today. Attempted SVT, however developed tachypnea with shallow breathing and low tidal volumes. Had to be placed back on a rate.  STUDIES:    SIGNIFICANT EVENTS: 2/15>> intubated, bronchoscopy BAL   VITAL SIGNS: Temp:  [98.1 F (36.7 C)-98.9 F (37.2 C)] 98.4 F (36.9 C) (02/17 0740) Pulse Rate:  [70-102] 91 (02/17 0700) Resp:  [16-37] 24 (02/17 0700) BP: (76-136)/(52-94) 120/82 mmHg (02/17 0700) SpO2:  [90 %-100 %] 100 % (02/17 0700) FiO2 (%):  [30 %-50 %] 30 % (02/17 0757) Weight:  [108 lb 11 oz (49.3 kg)] 108 lb 11 oz (49.3 kg) (02/17 0500) HEMODYNAMICS:   VENTILATOR SETTINGS: Vent Mode:  [-] PRVC FiO2 (%):  [30 %-50 %] 30 % Set Rate:  [15 bmp] 15 bmp Vt Set:  [400 mL] 400 mL PEEP:  [5 cmH20] 5 cmH20 Plateau Pressure:  [19 cmH20-20 cmH20] 19 cmH20 INTAKE / OUTPUT:  Intake/Output Summary (Last 24 hours) at 09/13/15 0942 Last data filed at 09/13/15 0700  Gross per 24 hour  Intake 1994.17 ml  Output    550 ml  Net 1444.17 ml    Review of Systems  Unable to perform ROS: intubated    Physical Exam  Constitutional:  Cachectic appearance  HENT:  Head: Normocephalic and atraumatic.  Right Ear: External ear normal.  Left Ear: External ear normal.  Eyes: Conjunctivae and EOM are normal. Pupils are equal, round, and reactive to light.  Neck: Normal range of motion. Neck supple. No thyromegaly present.  Cardiovascular: Normal rate, regular rhythm, normal  heart sounds and intact distal pulses.   No murmur heard. Pulmonary/Chest:  Into beta, mild shallow breath sounds at the bases, no significant adventitious sounds  Abdominal: Soft. Bowel sounds are normal. She exhibits no distension.  Musculoskeletal: She exhibits no edema.  Neurological: She is alert.  Intubated and sedated   Skin: Skin is warm and dry.  Nursing note and vitals reviewed.    LABS:  CBC  Recent Labs Lab 09/11/15 0532 09/12/15 0343 09/13/15 0505  WBC 23.7* 26.7* 17.1*  HGB 11.0* 9.7* 9.4*  HCT 32.6* 30.3* 28.8*  PLT 410 433 440   Coag's No results for input(s): APTT, INR in the last 168 hours. BMET  Recent Labs Lab 09/11/15 0532 09/12/15 0343 09/13/15 0505  NA 142 140 140  K 4.1 4.2 3.9  CL 114* 113* 111  CO2 BUN CREATININE 0.30* 0.38* 0.40*  GLUCOSE 82 118* 136*   Electrolytes  Recent Labs Lab 09/10/15 0618 09/11/15 0532 09/12/15 0343 09/13/15 0505  CALCIUM 7.2* 7.6* 7.5* 7.4*  MG 1.8 2.1 2.0  --   PHOS 2.2* 2.9 2.6  --    Sepsis Markers No results for input(s): LATICACIDVEN, PROCALCITON, O2SATVEN in the last 168 hours. ABG  Recent Labs Lab 09/11/15 0916 09/11/15 1759  PHART 7.44 7.36  PCO2ART 34 43  PO2ART 67* 67*   Liver Enzymes  Recent Labs Lab 09/12/15 0343  ALBUMIN 1.7*   Cardiac Enzymes No results for input(s): TROPONINI,  PROBNP in the last 168 hours. Glucose  Recent Labs Lab 09/12/15 1930 09/12/15 2357 09/13/15 0403 09/13/15 0732  GLUCAP 88 92 147* 115*    Imaging No results found.  Cultures: BCx2  UC 2/10-VRE Sputum MRSA PCR negative C. Difficile-antigen positive Influenza panel negative   Antibiotics: Vancomycin linezolid Lines: [facility] CVL 2/15>>  ASSESSMENT / PLAN: 55 year old female past medical history of ALS, chronic Foley catheter, recurrent UTI,initially admitted on 2/8 weakness, hallucination, secondary to possible GI sepsis, hospitalization complicated with C.  Difficile, respiratory failure requiring intubation, now with slow respiratory improvement.    PULMONARY OETT 8.0 Respiratory failure-ventilator dependent ALS Sepsis Right lower lobe atelectasis/pneumonia P:  -continue mechanical ventilation, wean as tolerated, keep O2 saturations greater than 80% -SBT/WUA daily -more awake and alert today, possibly another 24-48hrs of intubation.  -ABG as needed, chest x-ray as needed  -s/p bronch with BAL on 2/15, no significant findings.  - intermittent periods of tachypnea (40s), on low dose precedex, will restart home valium   CARDIOVASCULAR CVL [facility] Continue to monitor hemodynamics,    RENAL A: Chronic indwelling Foley catheter, chronic urinary tract infection, VRE P:  -continue antibiotics as directed by infectious disease - ICU electrolyte replacement protocol -Monitor renal function, avoid nephrotoxic drugs  GASTROINTESTINAL GERD-continue PPI Protein malnutrition-start tube feeds  INFECTIOUS UTI-VRE Positive for C. Difficile antigen P:  - continue antibiotics as directed by infectious disease, currently on linezolid and oral vancomycin  ENDOCRINE -ICU hypo/ hyperglycemic protocol  NEUROLOGIC ALS Seizure disorder Anxiety P:  RASS goal:- 1 -continue with ALS medications, bed bound at baseline, able to sit without assistance, able to verbally communicate at baseline. -currently since she is on linezolid her nuedexta is on hold secondary to drug-drug interaction causing serotonin syndrome -Continue with antidepressants and muscle relaxants -Continue her Keppra -low dose precedex for mild agitation  -restart home valium   Thank you for consulting [facility], Please feel free to contacts [place] with any questions at [phone] (please enter 7-digits).  I have personally obtained a history, examined the patient, evaluated laboratory and imaging results, formulated the assessment and plan and  placed orders.  The Patient requires high complexity decision making for assessment and support, frequent evaluation and titration of therapies, application of advanced monitoring technologies and extensive interpretation of multiple databases. Critical Care Time devoted to patient care services described in this note is 35 minutes.   Overall, patient is critically ill, prognosis is guarded. Patient at high risk for cardiac arrest and death.    Stephanie Acre, MD [facility] Pager [phone] (please enter 7-digits) On Call Pager - [phone] (please enter 7-digits)  Note: This note was prepared with Dragon dictation along with smaller phrase technology. Any transcriptional errors that result from this process are unintentional.

## 2015-09-13 NOTE — Care Management (Cosign Needed)
Failed wean attempt .  Has had ALS for 5 years and this is the first time patient has had respiratory problems. Has not had need for 02 equipment in the home per report

## 2015-09-13 NOTE — Progress Notes (Signed)
[facility] - [place] at [facility]   PATIENT NAME: Jennifer [name]    MR#:  [id number]  DATE OF BIRTH:  Feb 12, 1961  SUBJECTIVE:  CHIEF COMPLAINT:   Chief Complaint  Patient presents with  . Altered Mental Status   - Patient was on sedation vacation this a.m. SBT trials were not successful yesterday According to husband patient takes Valium every 6 hours at home, restarted today Had bronchoscopy 09/11/2015. Patient is awake and alert today REVIEW OF SYSTEMS:  Review of Systems  Unable to perform ROS: mental acuity    DRUG ALLERGIES:   Allergies  Allergen Reactions  . Bee Venom Anaphylaxis  . Hydrocodone Other (See Comments)    Reaction:  Muscle stiffness   . Lipitor [Atorvastatin] Other (See Comments)    Reaction:  Muscle cramps   . Shellfish Allergy Anaphylaxis  . Oxycodone Other (See Comments)    Reaction:  Muscle stiffness   . Prednisone Other (See Comments)    Reaction:  Psychosis   . Vicodin [Hydrocodone-Acetaminophen] Other (See Comments)    Reaction:  Muscle stiffness   . Oxycontin [Oxycodone Hcl] Other (See Comments)    Reaction:  Muscle stiffness   . Peanut-Containing Drug Products Diarrhea    VITALS:  Blood pressure 115/77, pulse 81, temperature 98.4 F (36.9 C), temperature source Axillary, resp. rate 30, height  (1.575 m), weight 49.3 kg (108 lb 11 oz), SpO2 98 %.  PHYSICAL EXAMINATION:  Physical Exam  GENERAL:  55 y.o.-year-old patient lying in the bed with respiratory distress, on BiPAP Patient has ALS. Appears contracted in bed EYES: Pupils equal, round, reactive to light and accommodation. No scleral icterus. HEENT: Head atraumatic, normocephalic. Oropharynx and nasopharynx clear.  NECK:  Supple, no jugular venous distention. No thyroid enlargement, no tenderness.  LUNGS: Diminished and coarse breath sounds bilaterally, no use of accessory muscles of respiration. Minimal rales and rhonchi CARDIOVASCULAR: S1, S2  normal. No murmurs, rubs, or gallops.  ABDOMEN: Soft, nontender, nondistended. Bowel sounds present. No organomegaly or mass.  EXTREMITIES: No pedal edema, cyanosis, or clubbing.  NEUROLOGIC: Lying in bed, contracted appearing, .not following commands, PSYCHIATRIC: Could not be elicited as the patient is on BiPAP  SKIN: No obvious rash, lesion, or ulcer.    LABORATORY PANEL:   CBC  Recent Labs Lab 09/13/15 0505  WBC 17.1*  HGB 9.4*  HCT 28.8*  PLT 440   ------------------------------------------------------------------------------------------------------------------  Chemistries   Recent Labs Lab 09/12/15 0343 09/13/15 0505  NA 140 140  K 4.2 3.9  CL 113* 111  CO2 22 25  GLUCOSE 118* 136*  BUN 11 15  CREATININE 0.38* 0.40*  CALCIUM 7.5* 7.4*  MG 2.0  --    ------------------------------------------------------------------------------------------------------------------  Cardiac Enzymes No results for input(s): TROPONINI in the last 168 hours. ------------------------------------------------------------------------------------------------------------------  RADIOLOGY:  Dg Chest 1 View  09/11/2015  CLINICAL DATA:  Acute respiratory failure. On ventilator. Central line placement. EXAM: CHEST 1 VIEW COMPARISON:  Prior today FINDINGS: New endotracheal tube and nasogastric tube are seen in appropriate position. A new left jugular central venous catheter is seen with tip overlying the mid to lower right atrium. No pneumothorax visualized. Mildly improved aeration of both lungs is seen. Bilateral airspace disease again seen with central perihilar predominance, suspicious for acute pulmonary edema, with pneumonia considered less likely. Probable layering right pleural effusion noted. IMPRESSION: New left jugular central venous catheter tip in mid to lower right atrium. No pneumothorax visualized. Endotracheal tube and nasogastric tube in appropriate  position. Improved aeration of  both lungs. Bilateral airspace disease with perihilar predominance, suspicious for acute pulmonary edema, with pneumonia considered less likely. Probable layering right pleural effusion. Electronically Signed   By: Myles Rosenthal M.D.   On: 09/11/2015 13:56   Dg Abd 1 View  09/11/2015  CLINICAL DATA:  NG tube placement EXAM: ABDOMEN - 1 VIEW COMPARISON:  None. FINDINGS: Supine view of the abdomen at 1320 hours shows contrast material in the right colon. NG tube is visualized with tip in the antral pyloric region of the stomach. The bowel gas pattern is nonspecific. IMPRESSION: NG tube tip is in the distal stomach. Electronically Signed   By: Kennith Center M.D.   On: 09/11/2015 13:54    EKG:   Orders placed or performed during the hospital encounter of 09/04/15  . ED EKG 12-Lead  . ED EKG 12-Lead    ASSESSMENT AND PLAN:   55 year old female with known history of ALS with chronic Foley catheter and prior history of multiple UTIs, depression, GERD was brought in secondary to altered mental status.   #Acute hypoxic respiratory failure probably secondary to aspiration pneumonia/sepsis and ALS  intubated 09/11/2015 by intensivist Status post bronchoscopy and BAL 09/11/2015-cultures are pending Suction-collection looks like liquid from tube feeds-question mark aspiration-sent for  culture SBT trials today after giving Valium Central line is placed 2/15 On tube feeds Continue current antibiotics Appreciate pulmonologist input   # Metabolic encephalopathy-secondary to sepsis -Leukocytosis is trending down WBC 17,000 today -Continue current IV antibiotic Zyvox and  Vancomycin, per ID recommendations  Sepsis- VRE UTI , C. difficile and pneumonia now. -ID consult appreciated.  -- Influenza test is negative. -    #3 hypokalemia-known history of chronic hypokalemia and takes potassium supplements at home. Replace as needed   #4 seizure disorder-continue Keppra    #5 ALS-following with  neurologist at [facility]. -Bed bound at baseline, but still able to sit with assistance and feed herself and alert and interactive. -Continue to monitor as patient is not close to her baseline. Also takes antidepressants and muscle relaxants for the same. Restart now as more alert and able to take oral meds  #6 Dysphagia- was evaluated by speech therapy and was on dysphagia diet Currently nothing by mouth, ON tube feeds  #7 Severe Malnutrition- dietary consulted Start tube feeds while patient is nothing by mouth and on ventilator  #7 DVT prophylaxis-on Lovenox  #8 C diff- oral vanc. Via tube  #9 Pneumonia, probably from aspiration   Linezolid Discussed with Dr. Sampson Goon ID, appreciate his recommendations   All the records are reviewed and case discussed with Care Management/Social Workerr. Management plans discussed with the patient's husband at bedside, aware of the plan  CODE STATUS: Full Code  TOTAL CRITICAL CARE TIME TAKING CARE OF THIS PATIENT: 35 minutes.   POSSIBLE D/C IN ? DAYS, DEPENDING ON CLINICAL CONDITION.   Ramonita Lab M.D on 09/13/2015 at 12:34 PM  Between 7am to 6pm - Pager - [phone]  After 6pm go to [email or web address] - password [email or web address] [facility]  [facility]  Office  [phone]  CC: Primary care physician; Ruthe Mannan, MD

## 2015-09-13 NOTE — Progress Notes (Signed)
PHARMACY - CRITICAL CARE PROGRESS NOTE  Pharmacy Consult for electrolyte management      Allergies  Allergen Reactions  . Bee Venom Anaphylaxis  . Hydrocodone Other (See Comments)    Reaction:  Muscle stiffness   . Lipitor [Atorvastatin] Other (See Comments)    Reaction:  Muscle cramps   . Shellfish Allergy Anaphylaxis  . Oxycodone Other (See Comments)    Reaction:  Muscle stiffness   . Prednisone Other (See Comments)    Reaction:  Psychosis   . Vicodin [Hydrocodone-Acetaminophen] Other (See Comments)    Reaction:  Muscle stiffness   . Oxycontin [Oxycodone Hcl] Other (See Comments)    Reaction:  Muscle stiffness   . Peanut-Containing Drug Products Diarrhea    Patient Measurements: Height: 5\' 2"  (157.5 cm) Weight: 108 lb 11 oz (49.3 kg) IBW/kg (Calculated) : 50.1  Vital Signs: Temp: 98.4 F (36.9 C) (02/17 0740) Temp Source: Axillary (02/17 0740) BP: 118/78 mmHg (02/17 1100) Pulse Rate: 87 (02/17 1120) Intake/Output from previous day: 02/16 0701 - 02/17 0700 In: 2464.2 [I.V.:1426.5; NG/GT:507.7; IV Piggyback:530] Out: 550 [Urine:550] Intake/Output from this shift: Total I/O In: 893.3 [I.V.:188.3; NG/GT:250; IV Piggyback:455] Out: -  Vent settings for last 24 hours: Vent Mode:  [-] PRVC FiO2 (%):  [30 %-40 %] 30 % Set Rate:  [15 bmp] 15 bmp Vt Set:  [400 mL] 400 mL PEEP:  [5 cmH20] 5 cmH20 Plateau Pressure:  [19 cmH20-20 cmH20] 19 cmH20  Labs:  Recent Labs  09/11/15 0532 09/12/15 0343 09/13/15 0505  WBC 23.7* 26.7* 17.1*  HGB 11.0* 9.7* 9.4*  HCT 32.6* 30.3* 28.8*  PLT 410 433 440  CREATININE 0.30* 0.38* 0.40*  MG 2.1 2.0  --   PHOS 2.9 2.6  --   ALBUMIN  --  1.7*  --    Estimated Creatinine Clearance: 62.6 mL/min (by C-G formula based on Cr of 0.4).   Recent Labs  09/12/15 2357 09/13/15 0403 09/13/15 0732  GLUCAP 92 147* 115*    Microbiology: Recent Results (from the past 720 hour(s))  Urine culture     Status: None   Collection Time:  09/04/15  9:01 PM  Result Value Ref Range Status   Specimen Description URINE, RANDOM  Final   Special Requests NONE  Final   Culture MULTIPLE SPECIES PRESENT, SUGGEST RECOLLECTION  Final   Report Status 09/06/2015 FINAL  Final  Blood Culture (routine x 2)     Status: None   Collection Time: 09/04/15  9:41 PM  Result Value Ref Range Status   Specimen Description BLOOD RIGHT ARM  Final   Special Requests BOTTLES DRAWN AEROBIC AND ANAEROBIC  Final   Culture NO GROWTH 5 DAYS  Final   Report Status 09/09/2015 FINAL  Final  Blood Culture (routine x 2)     Status: None   Collection Time: 09/04/15  9:41 PM  Result Value Ref Range Status   Specimen Description BLOOD LEFT ARM  Final   Special Requests BOTTLES DRAWN AEROBIC AND ANAEROBIC  Final   Culture NO GROWTH 5 DAYS  Final   Report Status 09/09/2015 FINAL  Final  Urine culture     Status: None   Collection Time: 09/06/15  4:58 PM  Result Value Ref Range Status   Specimen Description URINE, CATHETERIZED  Final   Special Requests ertapenem and vanco Normal  Final   Culture   Final    >=100,000 COLONIES/mL VANCOMYCIN RESISTANT ENTEROCOCCUS ENTEROCOCCUS FAECIUM VRE HAVE INTRINSIC RESISTANCE TO MOST  COMMONLY USED ANTIBIOTICS AND THE ABILITY TO ACQUIRE RESISTANCE TO MOST AVAILABLE ANTIBIOTICS. WITH MIXED BACTERIAL ORGANISMS    Report Status 09/10/2015 FINAL  Final   Organism ID, Bacteria VANCOMYCIN RESISTANT ENTEROCOCCUS  Final      Susceptibility   Vancomycin resistant enterococcus - MIC*    AMPICILLIN >=32 RESISTANT Resistant     LEVOFLOXACIN >=8 RESISTANT Resistant     NITROFURANTOIN 128 RESISTANT Resistant     VANCOMYCIN >=32 RESISTANT Resistant     LINEZOLID 2 SENSITIVE Sensitive     * >=100,000 COLONIES/mL VANCOMYCIN RESISTANT ENTEROCOCCUS  C difficile quick scan w PCR reflex     Status: Abnormal   Collection Time: 09/09/15  4:14 PM  Result Value Ref Range Status   C Diff antigen POSITIVE (A) NEGATIVE Final   C Diff  toxin NEGATIVE NEGATIVE Final   C Diff interpretation   Final    Positive for toxigenic C. difficile, active toxin production not detected. Patient has toxigenic C. difficile organisms present in the bowel, but toxin was not detected. The patient may be a carrier or the level of toxin in the sample was below the limit  of detection. This information should be used in conjunction with the patient's clinical history when deciding on possible therapy.     Comment: CRITICAL RESULT CALLED TO, READ BACK BY AND VERIFIED WITH: Burnett Corrente ON 09/09/15 AT 2019 BY TLB   Clostridium Difficile by PCR     Status: Abnormal   Collection Time: 09/09/15  4:14 PM  Result Value Ref Range Status   Toxigenic C Difficile by pcr POSITIVE (A) NEGATIVE Final    Comment: CRITICAL RESULT CALLED TO, READ BACK BY AND VERIFIED WITH: Burnett Corrente ON 09/09/15 AT 2019 BY TLB   MRSA PCR Screening     Status: None   Collection Time: 09/11/15  6:52 AM  Result Value Ref Range Status   MRSA by PCR NEGATIVE NEGATIVE Final    Comment:        The GeneXpert MRSA Assay (FDA approved for NASAL specimens only), is one component of a comprehensive MRSA colonization surveillance program. It is not intended to diagnose MRSA infection nor to guide or monitor treatment for MRSA infections.   Culture, bal-quantitative     Status: None   Collection Time: 09/11/15  2:21 PM  Result Value Ref Range Status   Specimen Description BRONCHIAL ALVEOLAR LAVAGE  Final   Special Requests Normal BRONCHIAL BRUSHING  Final   Gram Stain   Final    FEW WBC SEEN RARE SQUAMOUS EPITHELIAL CELLS PRESENT NO ORGANISMS SEEN    Culture Consistent with normal respiratory flora.  Final   Report Status 09/13/2015 FINAL  Final  Fungus Culture with Smear     Status: None (Preliminary result)   Collection Time: 09/11/15  2:21 PM  Result Value Ref Range Status   Specimen Description BRONCHIAL BRUSHING  Final   Special Requests Normal  Final   Culture  HOLDING FOR POSSIBLE PATHOGEN  Final   Report Status PENDING  Incomplete    Medications:  Scheduled:  . antiseptic oral rinse  7 mL Mouth Rinse QID  . chlorhexidine gluconate  15 mL Mouth Rinse BID  . enoxaparin (LOVENOX) injection  30 mg Subcutaneous Q24H  . famotidine (PEPCID) IV  20 mg Intravenous Q12H  . feeding supplement (VITAL HIGH PROTEIN)  1,000 mL Per Tube Q24H  . free water  30 mL Per Tube Q4H  . levETIRAcetam  500 mg Intravenous Q12H  .  linezolid (ZYVOX) IV  600 mg Intravenous Q12H  . midazolam  4 mg Intravenous Once  . phenylephrine  1 suppository Rectal BID  . piperacillin-tazobactam (ZOSYN)  IV  3.375 g Intravenous 3 times per day  . tiZANidine  2 mg Oral QHS  . vancomycin  125 mg Oral 4 times per day   Infusions:  . 0.9 % NaCl with KCl 20 mEq / L 50 mL/hr at 09/13/15 0600  . dexmedetomidine 0.3 mcg/kg/hr (09/13/15 1124)  . norepinephrine (LEVOPHED) Adult infusion 1 mcg/min (09/13/15 0935)    Assessment: Pharmacy consulted for electrolyte management for 55 yo female ICU patient requiring mechanical ventilation.     Plan:  Electrolytes are WNL. Will order follow up electrolytes with am labs.   Pharmacy will continue to monitor and adjust per consult.    Luisa Hart D 09/13/2015,12:00 PM

## 2015-09-13 NOTE — Progress Notes (Signed)
Nutrition Follow-up  DOCUMENTATION CODES:   Severe malnutrition in context of chronic illness  INTERVENTION:   EN: recommend contnuing TF at current goal rate of 45 ml/hr, continue to assess  NUTRITION DIAGNOSIS:   Inadequate oral intake related to acute illness as evidenced by NPO status. Being addressed via TF  GOAL:   Patient will meet greater than or equal to 90% of their needs  MONITOR:    (Energy intake)  REASON FOR ASSESSMENT:   Consult Enteral/tube feeding initiation and management  ASSESSMENT:    Pt remains on vent, failed extubation this AM, may reattempt later daoy  EN: tolerating Vital High Protein at rate of 45 ml/hr  Digestive System: last BM 2/17, abdomen soft, BS active, no signs of TF intolerance   Recent Labs Lab 09/10/15 0618 09/11/15 0532 09/12/15 0343 09/13/15 0505  NA 142 142 140 140  K 4.0 4.1 4.2 3.9  CL 110 114* 113* 111  CO2 16* BUN CREATININE 0.38* 0.30* 0.38* 0.40*  CALCIUM 7.2* 7.6* 7.5* 7.4*  MG 1.8 2.1 2.0  --   PHOS 2.2* 2.9 2.6  --   GLUCOSE 77 82 118* 136*    Glucose Profile:  Recent Labs  09/13/15 0403 09/13/15 0732 09/13/15 1202  GLUCAP 147* 115* 197*   Meds: NS at 50 ml/hr, precedex   Height:   Ht Readings from Last 1 Encounters:  09/11/15  (1.575 m)    Weight:   Wt Readings from Last 1 Encounters:  09/13/15 108 lb 11 oz (49.3 kg)    BMI:  Body mass index is 19.87 kg/(m^2).  Estimated Nutritional Needs:   Kcal:  1123kcals, (BEE: 1011kcals, Ve: 6, Tmax: 37) using [employer] Equation and current weight of 45.8kg  Protein:  69-92g protein (1.5-2.0g/kg)  Fluid:  (25-53ml//kg) 900-1020ml/d  EDUCATION NEEDS:   No education needs identified at this time  HIGH Care Level  [facility] MS, RD, LDN [phone] Pager  [phone] Weekend/On-Call Pager

## 2015-09-13 NOTE — Progress Notes (Signed)
Pharmacy Antibiotic Note  Jennifer [name] is a 55 y.o. female admitted on 09/04/2015 with UTI/sepsis.  Pharmacy has been consulted for Zosyn dosing per ID for possible HAP/Aspiration PNA. Patient with urine culture growing VRE on Linezolid.   Plan:  Continue Zosyn EI 3.375g IV Q8hr. Will continue to follow for signs of infection resolution.  Height:  (157.5 cm) Weight: 108 lb 11 oz (49.3 kg) IBW/kg (Calculated) : 50.1  Temp (24hrs), Avg:98.6 F (37 C), Min:98.1 F (36.7 C), Max:98.9 F (37.2 C)   Recent Labs Lab 09/08/15 0457 09/09/15 0427 09/10/15 0618 09/11/15 0532 09/12/15 0343 09/13/15 0505  WBC 14.8*  --   --  23.7* 26.7* 17.1*  CREATININE 0.48 0.37* 0.38* 0.30* 0.38* 0.40*    Estimated Creatinine Clearance: 62.6 mL/min (by C-G formula based on Cr of 0.4).    Allergies  Allergen Reactions  . Bee Venom Anaphylaxis  . Hydrocodone Other (See Comments)    Reaction:  Muscle stiffness   . Lipitor [Atorvastatin] Other (See Comments)    Reaction:  Muscle cramps   . Shellfish Allergy Anaphylaxis  . Oxycodone Other (See Comments)    Reaction:  Muscle stiffness   . Prednisone Other (See Comments)    Reaction:  Psychosis   . Vicodin [Hydrocodone-Acetaminophen] Other (See Comments)    Reaction:  Muscle stiffness   . Oxycontin [Oxycodone Hcl] Other (See Comments)    Reaction:  Muscle stiffness   . Peanut-Containing Drug Products Diarrhea   Antibiotics:  Zosyn 02/08 >> 02/08 Vancomycin 02/08 >> 02/10 Ertapenem 02/09 >> 02/10 Daptomycin 02/10 >> 02/13 Meropenem 02/10 >> 02/12 Metronidazole 02/13 >> 02/14 Linezolid 02/14 >> Oral vancomycin 02/14 >> Zosyn 02/15 >>  Microbiology: 02/08 Urine cx: multiple species 02/08 Blood cx: NG 02/10 Urine cx: VRE 02/13 C diff: positive 02/15 MRSA PCR: negative 02/15 BAL: pending 02/15 Fungal: NGTD 02/15 Viral: pending   Pharmacy will continue to monitor and adjust per consult.   Luisa Hart D 09/13/2015 12:00  PM

## 2015-09-13 NOTE — Progress Notes (Signed)
55 y/o F on Lovenox 30 mg daily for DVT prophylaxis.   Weight: 49 kg, CrCl: 62.6 ml/min  Patient's weight and renal function are appropriate for Lovenox dosing of 40 mg q 24 hours. Will adjust dosing.

## 2015-09-13 NOTE — Progress Notes (Signed)
eLink Physician-Brief Progress Note Patient Name: Jennifer [name] DOB: 01-10-1961 MRN: [id number]   Date of Service  09/13/2015  HPI/Events of Note  oliguria  eICU Interventions  Bolus 500cc saline     Intervention Category Intermediate Interventions: Oliguria - evaluation and management Minor Interventions: Routine modifications to care plan (e.g. PRN medications for pain, fever)  Max Fickle 09/13/2015, 7:42 PM

## 2015-09-13 NOTE — Progress Notes (Signed)
Spoke with Magdeline NP about pt's UOP. Was 125 last shift and 125 this shift. Order for NS bolus now and change IVF to 139ml/hr. Labs in am.

## 2015-09-14 ENCOUNTER — Inpatient Hospital Stay: Payer: BLUE CROSS/BLUE SHIELD

## 2015-09-14 LAB — BASIC METABOLIC PANEL
Anion gap: 3 — ABNORMAL LOW (ref 5–15)
BUN: 14 mg/dL (ref 6–20)
CO2: 25 mmol/L (ref 22–32)
CREATININE: 0.32 mg/dL — AB (ref 0.44–1.00)
Calcium: 7.2 mg/dL — ABNORMAL LOW (ref 8.9–10.3)
Chloride: 111 mmol/L (ref 101–111)
GFR calc Af Amer: >60 mL/min (ref >60)
Glucose, Bld: 129 mg/dL — ABNORMAL HIGH (ref 65–99)
Potassium: 3.4 mmol/L — ABNORMAL LOW (ref 3.5–5.1)
SODIUM: 139 mmol/L (ref 135–145)

## 2015-09-14 LAB — BLOOD GAS, ARTERIAL
ACID-BASE DEFICIT: 2.5 mmol/L — AB (ref 0.0–2.0)
ALLENS TEST (PASS/FAIL): POSITIVE — AB
BICARBONATE: 20.1 meq/L — AB (ref 21.0–28.0)
FIO2: 0.3
O2 SAT: 98.7 %
PATIENT TEMPERATURE: 37
PCO2 ART: 27 mmHg — AB (ref 32.0–48.0)
PEEP: 5 cmH2O
PO2 ART: 112 mmHg — AB (ref 83.0–108.0)
PRESSURE SUPPORT: 15 cmH2O
pH, Arterial: 7.48 — ABNORMAL HIGH (ref 7.350–7.450)

## 2015-09-14 LAB — PHOSPHORUS: Phosphorus: 2.7 mg/dL (ref 2.5–4.6)

## 2015-09-14 LAB — GLUCOSE, CAPILLARY
GLUCOSE-CAPILLARY: 111 mg/dL — AB (ref 65–99)
GLUCOSE-CAPILLARY: 118 mg/dL — AB (ref 65–99)
Glucose-Capillary: 137 mg/dL — ABNORMAL HIGH (ref 65–99)
Glucose-Capillary: 139 mg/dL — ABNORMAL HIGH (ref 65–99)
Glucose-Capillary: 123 mg/dL — ABNORMAL HIGH (ref 65–99)

## 2015-09-14 LAB — MAGNESIUM: MAGNESIUM: 1.6 mg/dL — AB (ref 1.7–2.4)

## 2015-09-14 MED ORDER — ALBUMIN HUMAN 25 % IV SOLN
12.5000 g | Freq: Once | INTRAVENOUS | Status: AC
  Administered 2015-09-14: 12.5 g via INTRAVENOUS
  Filled 2015-09-14: qty 50

## 2015-09-14 MED ORDER — MAGNESIUM SULFATE 4 GM/100ML IV SOLN: 4.0000 g | Freq: Once | INTRAVENOUS | Status: DC

## 2015-09-14 MED ORDER — POTASSIUM CHLORIDE 10 MEQ/100ML IV SOLN: 10.0000 meq | INTRAVENOUS | Status: DC

## 2015-09-14 MED ORDER — POTASSIUM CHLORIDE 20 MEQ/15ML (10%) PO SOLN
40.0000 meq | Freq: Once | ORAL | Status: DC
Start: 2015-09-14 — End: 2015-09-18

## 2015-09-14 MED ORDER — MAGNESIUM SULFATE 4 GM/100ML IV SOLN
4.0000 g | Freq: Once | INTRAVENOUS | Status: AC
  Administered 2015-09-14: 4 g via INTRAVENOUS
  Filled 2015-09-14: qty 100

## 2015-09-14 MED ORDER — FREE WATER
150.0000 mL | Freq: Four times a day (QID) | Status: DC
  Administered 2015-09-14 – 2015-09-15 (×4): 150 mL

## 2015-09-14 NOTE — Progress Notes (Signed)
Verbal order via Dr. Kendrick Fries to DC current foley due to being occluded with sediment. Place OG instead of NG since patient has pulled NG twice today.

## 2015-09-14 NOTE — Progress Notes (Signed)
PHARMACY - CRITICAL CARE PROGRESS NOTE  Pharmacy Consult for electrolyte management      Allergies  Allergen Reactions  . Bee Venom Anaphylaxis  . Hydrocodone Other (See Comments)    Reaction:  Muscle stiffness   . Lipitor [Atorvastatin] Other (See Comments)    Reaction:  Muscle cramps   . Shellfish Allergy Anaphylaxis  . Oxycodone Other (See Comments)    Reaction:  Muscle stiffness   . Prednisone Other (See Comments)    Reaction:  Psychosis   . Vicodin [Hydrocodone-Acetaminophen] Other (See Comments)    Reaction:  Muscle stiffness   . Oxycontin [Oxycodone Hcl] Other (See Comments)    Reaction:  Muscle stiffness   . Peanut-Containing Drug Products Diarrhea    Patient Measurements: Height:  (157.5 cm) Weight: 117 lb 4.6 oz (53.2 kg) IBW/kg (Calculated) : 50.1  Vital Signs: Temp: 98.3 F (36.8 C) (02/18 0330) Temp Source: Axillary (02/18 0330) BP: 67/57 mmHg (02/18 0600) Pulse Rate: 72 (02/18 0600) Intake/Output from previous day: 02/17 0701 - 02/18 0700 In: 3612 [I.V.:1617; NG/GT:935; IV Piggyback:1060] Out: 250 [Urine:250] Intake/Output from this shift: Total I/O In: 1979.5 [I.V.:1154.5; NG/GT:270; IV Piggyback:555] Out: 125 [Urine:125] Vent settings for last 24 hours: Vent Mode:  [-] PRVC FiO2 (%):  [30 %] 30 % Set Rate:  [15 bmp] 15 bmp Vt Set:  [400 mL] 400 mL PEEP:  [5 cmH20] 5 cmH20 Plateau Pressure:  [19 cmH20-24 cmH20] 24 cmH20  Labs:  Recent Labs  09/12/15 0343 09/13/15 0505 09/14/15 0500  WBC 26.7* 17.1*  --   HGB 9.7* 9.4*  --   HCT 30.3* 28.8*  --   PLT 433 440  --   CREATININE 0.38* 0.40* 0.32*  MG 2.0  --  1.6*  PHOS 2.6  --  2.7  ALBUMIN 1.7*  --   --    Estimated Creatinine Clearance: 63.6 mL/min (by C-G formula based on Cr of 0.32).   Recent Labs  09/13/15 1929 09/13/15 2359 09/14/15 0405  GLUCAP 100* 95 137*    Microbiology: Recent Results (from the past 720 hour(s))  Urine culture     Status: None   Collection  Time: 09/04/15  9:01 PM  Result Value Ref Range Status   Specimen Description URINE, RANDOM  Final   Special Requests NONE  Final   Culture MULTIPLE SPECIES PRESENT, SUGGEST RECOLLECTION  Final   Report Status 09/06/2015 FINAL  Final  Blood Culture (routine x 2)     Status: None   Collection Time: 09/04/15  9:41 PM  Result Value Ref Range Status   Specimen Description BLOOD RIGHT ARM  Final   Special Requests BOTTLES DRAWN AEROBIC AND ANAEROBIC  Final   Culture NO GROWTH 5 DAYS  Final   Report Status 09/09/2015 FINAL  Final  Blood Culture (routine x 2)     Status: None   Collection Time: 09/04/15  9:41 PM  Result Value Ref Range Status   Specimen Description BLOOD LEFT ARM  Final   Special Requests BOTTLES DRAWN AEROBIC AND ANAEROBIC  Final   Culture NO GROWTH 5 DAYS  Final   Report Status 09/09/2015 FINAL  Final  Urine culture     Status: None   Collection Time: 09/06/15  4:58 PM  Result Value Ref Range Status   Specimen Description URINE, CATHETERIZED  Final   Special Requests ertapenem and vanco Normal  Final   Culture   Final    >=100,000 COLONIES/mL VANCOMYCIN RESISTANT ENTEROCOCCUS ENTEROCOCCUS  FAECIUM VRE HAVE INTRINSIC RESISTANCE TO MOST COMMONLY USED ANTIBIOTICS AND THE ABILITY TO ACQUIRE RESISTANCE TO MOST AVAILABLE ANTIBIOTICS. WITH MIXED BACTERIAL ORGANISMS    Report Status 09/10/2015 FINAL  Final   Organism ID, Bacteria VANCOMYCIN RESISTANT ENTEROCOCCUS  Final      Susceptibility   Vancomycin resistant enterococcus - MIC*    AMPICILLIN >=32 RESISTANT Resistant     LEVOFLOXACIN >=8 RESISTANT Resistant     NITROFURANTOIN 128 RESISTANT Resistant     VANCOMYCIN >=32 RESISTANT Resistant     LINEZOLID 2 SENSITIVE Sensitive     * >=100,000 COLONIES/mL VANCOMYCIN RESISTANT ENTEROCOCCUS  C difficile quick scan w PCR reflex     Status: Abnormal   Collection Time: 09/09/15  4:14 PM  Result Value Ref Range Status   C Diff antigen POSITIVE (A) NEGATIVE Final   C  Diff toxin NEGATIVE NEGATIVE Final   C Diff interpretation   Final    Positive for toxigenic C. difficile, active toxin production not detected. Patient has toxigenic C. difficile organisms present in the bowel, but toxin was not detected. The patient may be a carrier or the level of toxin in the sample was below the limit  of detection. This information should be used in conjunction with the patient's clinical history when deciding on possible therapy.     Comment: CRITICAL RESULT CALLED TO, READ BACK BY AND VERIFIED WITH: Burnett Corrente ON 09/09/15 AT 2019 BY TLB   Clostridium Difficile by PCR     Status: Abnormal   Collection Time: 09/09/15  4:14 PM  Result Value Ref Range Status   Toxigenic C Difficile by pcr POSITIVE (A) NEGATIVE Final    Comment: CRITICAL RESULT CALLED TO, READ BACK BY AND VERIFIED WITH: Burnett Corrente ON 09/09/15 AT 2019 BY TLB   MRSA PCR Screening     Status: None   Collection Time: 09/11/15  6:52 AM  Result Value Ref Range Status   MRSA by PCR NEGATIVE NEGATIVE Final    Comment:        The GeneXpert MRSA Assay (FDA approved for NASAL specimens only), is one component of a comprehensive MRSA colonization surveillance program. It is not intended to diagnose MRSA infection nor to guide or monitor treatment for MRSA infections.   Culture, bal-quantitative     Status: None   Collection Time: 09/11/15  2:21 PM  Result Value Ref Range Status   Specimen Description BRONCHIAL ALVEOLAR LAVAGE  Final   Special Requests Normal BRONCHIAL BRUSHING  Final   Gram Stain   Final    FEW WBC SEEN RARE SQUAMOUS EPITHELIAL CELLS PRESENT NO ORGANISMS SEEN    Culture Consistent with normal respiratory flora.  Final   Report Status 09/13/2015 FINAL  Final  Fungus Culture with Smear     Status: None (Preliminary result)   Collection Time: 09/11/15  2:21 PM  Result Value Ref Range Status   Specimen Description BRONCHIAL BRUSHING  Final   Special Requests Normal  Final    Culture HOLDING FOR POSSIBLE PATHOGEN  Final   Report Status PENDING  Incomplete    Medications:  Scheduled:  . antiseptic oral rinse  7 mL Mouth Rinse QID  . chlorhexidine gluconate  15 mL Mouth Rinse BID  . enoxaparin (LOVENOX) injection  40 mg Subcutaneous Q24H  . famotidine (PEPCID) IV  20 mg Intravenous Q12H  . feeding supplement (VITAL HIGH PROTEIN)  1,000 mL Per Tube Q24H  . free water  30 mL Per Tube Q4H  .  levETIRAcetam  500 mg Intravenous Q12H  . linezolid (ZYVOX) IV  600 mg Intravenous Q12H  . magnesium sulfate 1 - 4 g bolus IVPB  4 g Intravenous Once  . midazolam  4 mg Intravenous Once  . phenylephrine  1 suppository Rectal BID  . piperacillin-tazobactam (ZOSYN)  IV  3.375 g Intravenous 3 times per day  . potassium chloride  40 mEq Per Tube Once  . tiZANidine  2 mg Oral QHS  . vancomycin  125 mg Oral 4 times per day   Infusions:  . sodium chloride 100 mL/hr at 09/13/15 1959  . dexmedetomidine 0.6 mcg/kg/hr (09/14/15 0232)  . norepinephrine (LEVOPHED) Adult infusion Stopped (09/13/15 1200)    Assessment: Pharmacy consulted for electrolyte management for 55 yo female ICU patient requiring mechanical ventilation.     Plan:  K 3.4, Mg 1.6, Phos 2.7. Electrolytes replaced by Elink. Will recheck with AM labs.    Carola Frost, Pharm.D., BCPS Clinical Pharmacist 09/14/2015,6:59 AM

## 2015-09-14 NOTE — Progress Notes (Signed)
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 Developed labored breathing requiring BiPAP with subsequent intubation, had a bronchoscopy with BAL, currently being monitored in ICU.  SUBJECTIVE:  No acute events overnight. Patient awake and following commands. Failed weaning trial this morning-MIP<-25 and RR 40-45. Foley replaced last night due to sediment in urine.    VITAL SIGNS: Temp:  [97.8 F (36.6 C)-98.3 F (36.8 C)] 98.3 F (36.8 C) (02/18 0330) Pulse Rate:  [42-100] 86 (02/18 0900) Resp:  [18-46] 32 (02/18 0900) BP: (67-138)/(53-109) 113/88 mmHg (02/18 0900) SpO2:  [98 %-100 %] 100 % (02/18 0900) FiO2 (%):  [30 %] 30 % (02/18 0755) Weight:  [117 lb 4.6 oz (53.2 kg)] 117 lb 4.6 oz (53.2 kg) (02/18 0424) HEMODYNAMICS: CVP:  [4 mmHg-59 mmHg] 59 mmHg VENTILATOR SETTINGS: Vent Mode:  [-] PRVC FiO2 (%):  [30 %] 30 % Set Rate:  [15 bmp] 15 bmp Vt Set:  [400 mL] 400 mL PEEP:  [5 cmH20] 5 cmH20 Plateau Pressure:  [19 cmH20-24 cmH20] 22 cmH20 INTAKE / OUTPUT:  Intake/Output Summary (Last 24 hours) at 09/14/15 0930 Last data filed at 09/14/15 0640  Gross per 24 hour  Intake 3417.03 ml  Output    250 ml  Net 3167.03 ml    Review of Systems  Unable to perform ROS: intubated    Physical Exam  Constitutional: No distress.  Chronically ill looking  HENT:  Head: Normocephalic and atraumatic.  Right Ear: External ear normal.  Left Ear: External ear normal.  Eyes: Conjunctivae and EOM are normal. Pupils are equal, round, and reactive to light.  Neck: Normal range of motion. Neck supple. No thyromegaly present.  Cardiovascular: Normal rate, regular rhythm, normal heart sounds and intact distal pulses.   No murmur  heard. Pulmonary/Chest: No respiratory distress. She has no wheezes. She has no rales.  Shallow breaths on PSV, Bilateral airflow with  diminished breathsounds at the bases  Abdominal: Soft. Bowel sounds are normal. She exhibits no distension.  Musculoskeletal: She exhibits no edema or tenderness.  Contractures in bilateral lower extremities  Neurological: She is alert.  Awake on the vent, following commands, moves upper extremities   Skin: Skin is warm and dry.  Nursing note and vitals reviewed.    LABS:  CBC  Recent Labs Lab 09/11/15 0532 09/12/15 0343 09/13/15 0505  WBC 23.7* 26.7* 17.1*  HGB 11.0* 9.7* 9.4*  HCT 32.6* 30.3* 28.8*  PLT 410 433 440   Coag's No results for input(s): APTT, INR in the last 168 hours. BMET  Recent Labs Lab 09/12/15 0343 09/13/15 0505 09/14/15 0500  NA 140 140 139  K 4.2 3.9 3.4*  CL 113* 111 111  CO2 BUN CREATININE 0.38* 0.40* 0.32*  GLUCOSE 118* 136* 129*   Electrolytes  Recent Labs Lab 09/11/15 0532 09/12/15 0343 09/13/15 0505 09/14/15 0500  CALCIUM 7.6* 7.5* 7.4* 7.2*  MG 2.1 2.0  --  1.6*  PHOS 2.9 2.6  --  2.7   Sepsis Markers No results for input(s): LATICACIDVEN, PROCALCITON, O2SATVEN in the last 168 hours. ABG  Recent Labs Lab 09/11/15 0916 09/11/15 1759  PHART 7.44 7.36  PCO2ART 34 43  PO2ART 67* 67*   Liver Enzymes  Recent  Labs Lab 09/12/15 0343  ALBUMIN 1.7*   Cardiac Enzymes No results for input(s): TROPONINI, PROBNP in the last 168 hours. Glucose  Recent Labs Lab 09/13/15 1202 09/13/15 1549 09/13/15 1929 09/13/15 2359 09/14/15 0405 09/14/15 0713  GLUCAP 197* 114* 100* 95 137* 111*    Imaging Dg Abd 1 View  09/13/2015  CLINICAL DATA:  Orogastric tube placement. EXAM: ABDOMEN - 1 VIEW COMPARISON:  Earlier today. FINDINGS: Orogastric tube tip in the distal stomach. Interval mildly prominent gas-filled small bowel loops in the left lower abdomen without  pathological dilatation. Oral contrast in the right and transverse colon. Unremarkable bones. IMPRESSION: 1. Orogastric tube tip in the distal stomach. 2. Minimal small bowel ileus. Electronically Signed   By: Beckie Salts M.D.   On: 09/13/2015 21:10   Dg Abd 1 View  09/13/2015  CLINICAL DATA:  Enteric tube placement EXAM: ABDOMEN - 1 VIEW COMPARISON:  09/11/2015 abdominal radiograph FINDINGS: Enteric tube terminates in the gastric fundus. The tip of a superior approach central venous catheter overlies the upper right atrium. No disproportionately dilated small bowel loops. Retained oral contrast throughout the colon. No evidence of pneumatosis or pneumoperitoneum. Patchy opacities at both lung bases. IMPRESSION: 1. Enteric tube terminates in the gastric fundus. 2. Nonobstructive bowel gas pattern. Retained oral contrast throughout the colon. 3. Nonspecific patchy bibasilar lung opacities, correlate with chest radiograph as clinically warranted. Electronically Signed   By: Delbert Phenix M.D.   On: 09/13/2015 18:50    Cultures: BCx2  UC 2/10-VRE Sputum MRSA PCR negative C. Difficile-antigen positive Influenza panel negative 02/15:BAL>NTD  STUDIES:   None   SIGNIFICANT EVENTS: 2/15>> intubated, bronchoscopy BAL-Cultures negative todate  Antibiotics: Vancomycin 02/14>>-Oral linezolid 02/15>> Zosyn 02/15>>   Lines [facility] CVL 2/15>> Foley  ASSESSMENT / PLAN: 55 year old female past medical history of ALS, chronic Foley catheter, recurrent UTI,initially admitted on 2/8 weakness, hallucination, secondary to possible UTI/sepsis, hospitalization complicated with C. Difficile, respiratory failure requiring intubation, now with slow respiratory improvement.   A: PULMONARY OETT 8.0 Respiratory failure-ventilator dependent ALS stage 4 Right lower lobe atelectasis/pneumonia P:  -Continue mechanical ventilation, wean as tolerated, keep O2 saturations greater than 80% -SBT/WUA daily  -MIP  with every weaning trial -CXR daily -ABG in am -f/u 2/15 BAL cultures.   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 Difficile antigen Sepsis P:  - continue antibiotics as directed by infectious disease - currently on linezolid, zosyn and oral vancomycin  A: ENDOCRINE Elevated blood glucose without diabetes P -ICU hypo/ hyperglycemic protocol  A: NEUROLOGIC ALS Seizure disorder Anxiety P:  RASS goal:0 TO -1 -continue with ALS medications, bed bound at baseline, able to sit without assistance, able to verbally communicate at baseline. -currently since she is on linezolid, her nuedexta is on hold secondary to drug-drug interaction causing serotonin syndrome -Continue with antidepressants and muscle relaxants -Continue her Keppra -low dose precedex for mild agitation  -Continue  home dose of valium  Family update/Disposition: Patient's sister and family at bedside. Updated on failed extubation trial. Husband indicated that the last  time she was at her ALS doctor's office at [facility], her "breathing was great". A review of her [facility] records indicate that she hasn't been there for a visit in the last 6 months. At her last visit, her FVC was ~70%. Disease progression and the subsequent need for artificial ventilation had been reviewed with patient and her family by her ALS doctor. We did relay to the family that this might be the new baseline for  patient incase we are unable to extubate her. Will re-evaluate tomorrow and update family accordingly.Family updated by Dr. Nicholos Johns and myself; questions answered, support provided  Total critical care time   Jennifer [name] ANP-BC Pulmonary and Critical Care Medicine [facility] Pager [phone]  Patient was seen and examined with Ms. practitioner and by agree with the above assessment and plan. 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 I did discuss with the husband today regarding the terminal nature of ALS and asked if he and his wife and had discussions about a potential use of tracheostomy. He indicates that he has not discussed this, but he will discuss this with her now while she is on the ventilator. I did discuss with him that if she is not able to come off the ventilator that it could be that her ALS has progressed, currently an NIF negative, 25, indicative of muscle weakness. I asked that he discuss with his wife and family to clarify the patient's CODE STATUS if and when the patient is extubated. We will need to know whether the patient should be reintubated if she fails. Should she fail extubation, she may require tracheostomy, which would likely be permanent.   Wells Guiles, M.D.   Critical Care Attestation.  I have personally obtained a history, examined the patient, evaluated laboratory and imaging results, formulated the assessment and plan and placed orders. The Patient requires high complexity decision making for assessment and support, frequent evaluation and titration of therapies, application of advanced monitoring technologies and extensive interpretation of multiple databases. The patient has critical illness that could lead imminently to failure of 1 or  more organ systems and requires the highest level of physician preparedness to intervene.  Critical Care Time devoted to patient care services described in this note is 35 minutes and is exclusive of time spent in procedures.

## 2015-09-14 NOTE — Progress Notes (Signed)
eLink Physician-Brief Progress Note Patient Name: Jennifer [name] DOB: [birth year] MRN: [id number]   Date of Service  09/14/2015  HPI/Events of Note  Hypotension - BP = 82/57. Albumin = 1.7.  eICU Interventions  Will order 25% albumin 12.5 gm IV now.      Intervention Category Major Interventions: Hypotension - evaluation and management  Amory Zbikowski [place] 09/14/2015, 10:05 PM

## 2015-09-14 NOTE — Progress Notes (Addendum)
NIF obtained per verbal order from [place], NP.  NIF -18 to -25 achieved, Magda and Dr. Ardyth Man at bedside and aware.  RT placed patient back on previous vent settings of PRVC 400, 15, 30%, +5.

## 2015-09-14 NOTE — Progress Notes (Signed)
Speech Therapy Note: reviewed chart notes; pt has been transferred to CCU and is orally intubated. MD attempted wean but pt has not tolerated such yet. ST will be available when pt's medical and respiratory status' have improved and pt is appropriate for reassessment.

## 2015-09-14 NOTE — Progress Notes (Signed)
Nutrition Follow-up  DOCUMENTATION CODES:   Severe malnutrition in context of chronic illness  INTERVENTION:   EN: recommend continuing current TF regimen, continue to assess  NUTRITION DIAGNOSIS:   Inadequate oral intake related to acute illness as evidenced by NPO status. Being addressed via TF  GOAL:   Patient will meet greater than or equal to 90% of their needs  MONITOR:    (Energy intake)  REASON FOR ASSESSMENT:   Consult Enteral/tube feeding initiation and management  ASSESSMENT:    Pt remains on vent, NG pulled out x 2, OG inserted and placement confirmed  EN: tolerating Vital High Protein at rate of 45 ml/hr  Skin:   (stage I pressure ulcer)  Digestive System: C.diff positive, +yellow mucousy stool   Recent Labs Lab 09/11/15 0532 09/12/15 0343 09/13/15 0505 09/14/15 0500  NA 142 140 140 139  K 4.1 4.2 3.9 3.4*  CL 114* 113* 111 111  CO2 BUN CREATININE 0.30* 0.38* 0.40* 0.32*  CALCIUM 7.6* 7.5* 7.4* 7.2*  MG 2.1 2.0  --  1.6*  PHOS 2.9 2.6  --  2.7  GLUCOSE 82 118* 136* 129*    Meds: reviewed  Height:   Ht Readings from Last 1 Encounters:  09/11/15  (1.575 m)    Weight:   Wt Readings from Last 1 Encounters:  09/14/15 117 lb 4.6 oz (53.2 kg)    Filed Weights   09/12/15 0532 09/13/15 0500 09/14/15 0424  Weight: 100 lb 15.5 oz (45.8 kg) 108 lb 11 oz (49.3 kg) 117 lb 4.6 oz (53.2 kg)    BMI:  Body mass index is 21.45 kg/(m^2).  Estimated Nutritional Needs:   Kcal:  1123kcals, (BEE: 1011kcals, Ve: 6, Tmax: 37) using [employer] Equation and current weight of 45.8kg  Protein:  69-92g protein (1.5-2.0g/kg)  Fluid:  (25-66ml//kg) 900-1044ml/d  EDUCATION NEEDS:   No education needs identified at this time  HIGH Care Level  [facility] MS, RD, LDN [phone] Pager  [phone] Weekend/On-Call Pager

## 2015-09-14 NOTE — Progress Notes (Addendum)
RT at bedside, placed patient in SBT 10/5 to allow patient to exercise breathing muscles.  Patient's brother and sister at bedside very encouraging to patient to maintain lower rate and higher Vt. RT remained at bedside entire time patient on SBT.  Patient remained on SBT until 1605 when she became agitated and SOB, requested to be put back on rate.  Patient placed back on previous PRVC settings without complication.  Will continue to monitor.

## 2015-09-14 NOTE — Progress Notes (Signed)
eLink Physician-Brief Progress Note Patient Name: Jennifer [name] DOB: March 26, 1961 MRN: [id number]   Date of Service  09/14/2015  HPI/Events of Note  Hypokalemia and hypomag  eICU Interventions  Potassium and mag replaced     Intervention Category Intermediate Interventions: Electrolyte abnormality - evaluation and management  DETERDING,ELIZABETH 09/14/2015, 6:04 AM

## 2015-09-14 NOTE — Progress Notes (Signed)
NP Magda called RT to make aware that she placed patient in SBT this am and ordered ABG for 1 hour post SBT , ABG pending for 0930.  Patient tolerating SBT well at this time.  Will continue to monitor.

## 2015-09-15 ENCOUNTER — Inpatient Hospital Stay: Payer: BLUE CROSS/BLUE SHIELD

## 2015-09-15 DIAGNOSIS — G1221 Amyotrophic lateral sclerosis: Secondary | ICD-10-CM

## 2015-09-15 LAB — CBC
HEMATOCRIT: 24.1 % — AB (ref 35.0–47.0)
Hemoglobin: 8 g/dL — ABNORMAL LOW (ref 12.0–16.0)
MCH: 31 pg (ref 26.0–34.0)
MCHC: 33 g/dL (ref 32.0–36.0)
MCV: 93.9 fL (ref 80.0–100.0)
Platelets: 366 10*3/uL (ref 150–440)
RBC: 2.57 MIL/uL — ABNORMAL LOW (ref 3.80–5.20)
RDW: 15.5 % — AB (ref 11.5–14.5)
WBC: 15.8 10*3/uL — ABNORMAL HIGH (ref 3.6–11.0)

## 2015-09-15 LAB — BASIC METABOLIC PANEL
Anion gap: 6 (ref 5–15)
BUN: 15 mg/dL (ref 6–20)
CALCIUM: 7.2 mg/dL — AB (ref 8.9–10.3)
CO2: 22 mmol/L (ref 22–32)
CREATININE: 0.31 mg/dL — AB (ref 0.44–1.00)
Chloride: 110 mmol/L (ref 101–111)
Glucose, Bld: 115 mg/dL — ABNORMAL HIGH (ref 65–99)
Potassium: 3.1 mmol/L — ABNORMAL LOW (ref 3.5–5.1)
SODIUM: 138 mmol/L (ref 135–145)

## 2015-09-15 LAB — GLUCOSE, CAPILLARY
GLUCOSE-CAPILLARY: 107 mg/dL — AB (ref 65–99)
GLUCOSE-CAPILLARY: 75 mg/dL (ref 65–99)
GLUCOSE-CAPILLARY: 81 mg/dL (ref 65–99)
GLUCOSE-CAPILLARY: 83 mg/dL (ref 65–99)
GLUCOSE-CAPILLARY: 89 mg/dL (ref 65–99)
Glucose-Capillary: 108 mg/dL — ABNORMAL HIGH (ref 65–99)
Glucose-Capillary: 111 mg/dL — ABNORMAL HIGH (ref 65–99)

## 2015-09-15 LAB — PHOSPHORUS: PHOSPHORUS: 2.3 mg/dL — AB (ref 2.5–4.6)

## 2015-09-15 LAB — MAGNESIUM: MAGNESIUM: 2.2 mg/dL (ref 1.7–2.4)

## 2015-09-15 MED ORDER — DEXTROSE-NACL 5-0.45 % IV SOLN
INTRAVENOUS | Status: DC
  Administered 2015-09-15 – 2015-09-16 (×2): via INTRAVENOUS

## 2015-09-15 MED ORDER — DEXMEDETOMIDINE HCL IN NACL 400 MCG/100ML IV SOLN
0.0000 ug/kg/h | INTRAVENOUS | Status: DC
  Administered 2015-09-15: 0.6 ug/kg/h via INTRAVENOUS
  Administered 2015-09-16: 0 ug/kg/h via INTRAVENOUS
  Administered 2015-09-16: 0.1 ug/kg/h via INTRAVENOUS
  Administered 2015-09-16: 0.5 ug/kg/h via INTRAVENOUS
  Filled 2015-09-15 (×2): qty 100

## 2015-09-15 MED ORDER — POTASSIUM & SODIUM PHOSPHATES 280-160-250 MG PO PACK
1.0000 | PACK | Freq: Two times a day (BID) | ORAL | Status: AC
  Administered 2015-09-15 (×2): 1 via ORAL
  Filled 2015-09-15 (×2): qty 1

## 2015-09-15 MED ORDER — SENNOSIDES 8.8 MG/5ML PO SYRP
5.0000 mL | ORAL_SOLUTION | Freq: Two times a day (BID) | ORAL | Status: DC
  Administered 2015-09-15 – 2015-09-17 (×3): 5 mL via ORAL
  Filled 2015-09-15 (×3): qty 5

## 2015-09-15 MED ORDER — POTASSIUM & SODIUM PHOSPHATES 280-160-250 MG PO PACK: 1.0000 | PACK | Freq: Two times a day (BID) | ORAL | Status: DC

## 2015-09-15 MED ORDER — POTASSIUM CHLORIDE 10 MEQ/100ML IV SOLN
10.0000 meq | INTRAVENOUS | Status: AC
  Administered 2015-09-15 (×4): 10 meq via INTRAVENOUS
  Filled 2015-09-15 (×4): qty 100

## 2015-09-15 MED ORDER — POLYETHYLENE GLYCOL 3350 17 G PO PACK
17.0000 g | PACK | Freq: Two times a day (BID) | ORAL | Status: DC
  Administered 2015-09-15 – 2015-09-19 (×8): 17 g via ORAL
  Filled 2015-09-15 (×8): qty 1

## 2015-09-15 MED ORDER — DEXTROSE 5 % AND 0.45 % NACL IV BOLUS: 100.0000 mL | Freq: Once | INTRAVENOUS | Status: DC

## 2015-09-15 NOTE — Progress Notes (Addendum)
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 Developed labored breathing requiring BiPAP with subsequent intubation, had a bronchoscopy with BAL, currently being monitored in ICU.  SUBJECTIVE:  Awake on the vent but appears more lethargic than yesterday. Failed weaning trials. Husband at bed side. He insists that patient will do well and will come off the vent despite another failed weaning trials. Her MIP was -20 x 3. She nods in disapproval to chest pain, abdominal pain and nausea.   VITAL SIGNS: Temp:  [97 F (36.1 C)-98.5 F (36.9 C)] 98.3 F (36.8 C) (02/19 0400) Pulse Rate:  [72-96] 83 (02/19 1000) Resp:  [16-37] 16 (02/19 1000) BP: (76-131)/(57-95) 114/80 mmHg (02/19 1000) SpO2:  [89 %-100 %] 99 % (02/19 1000) FiO2 (%):  [30 %] 30 % (02/19 1159) Weight:  [115 lb 11.9 oz (52.5 kg)] 115 lb 11.9 oz (52.5 kg) (02/19 0716) HEMODYNAMICS: CVP:  [7 mmHg-60 mmHg] 60 mmHg VENTILATOR SETTINGS: Vent Mode:  [-] PRVC FiO2 (%):  [30 %] 30 % Set Rate:  [15 bmp] 15 bmp Vt Set:  [400 mL] 400 mL PEEP:  [5 cmH20] 5 cmH20 Plateau Pressure:  [22 cmH20] 22 cmH20 INTAKE / OUTPUT:  Intake/Output Summary (Last 24 hours) at 09/15/15 1224 Last data filed at 09/15/15 1050  Gross per 24 hour  Intake   2281 ml  Output    225 ml  Net   2056 ml    Review of Systems  Unable to perform ROS: intubated    Physical Exam  Constitutional: No distress.  Chronically ill looking  HENT:  Head: Normocephalic and atraumatic.  Right Ear: External ear normal.  Left Ear: External ear normal.  Eyes: Conjunctivae and EOM are normal. Pupils are equal, round, and reactive to light.  Neck: Normal range of motion. Neck supple. No thyromegaly present.   Cardiovascular: Normal rate, regular rhythm, normal heart sounds and intact distal pulses.   No murmur heard. Pulmonary/Chest: No respiratory distress. She has no wheezes. She has no rales.  Shallow breaths on PSV, Bilateral airflow with  diminished breathsounds at the bases  Abdominal: Soft. Bowel sounds are normal. She exhibits no distension.  Musculoskeletal: She exhibits no edema or tenderness.  Contractures in bilateral lower extremities  Neurological: She is alert.  Awake on the vent, following commands, moves upper extremities but responses are sluggist   Skin: Skin is warm and dry.  Nursing note and vitals reviewed.    LABS:  CBC  Recent Labs Lab 09/12/15 0343 09/13/15 0505 09/15/15 0502  WBC 26.7* 17.1* 15.8*  HGB 9.7* 9.4* 8.0*  HCT 30.3* 28.8* 24.1*  PLT 433 440 366   Coag's No results for input(s): APTT, INR in the last 168 hours. BMET  Recent Labs Lab 09/13/15 0505 09/14/15 0500 09/15/15 0502  NA 140 139 138  K 3.9 3.4* 3.1*  CL 111 111 110  CO2 25 25 22   BUN 15 14 15   CREATININE 0.40* 0.32* 0.31*  GLUCOSE 136* 129* 115*   Electrolytes  Recent Labs Lab 09/12/15 0343 09/13/15 0505 09/14/15 0500 09/15/15 0502  CALCIUM 7.5* 7.4* 7.2* 7.2*  MG 2.0  --  1.6* 2.2  PHOS 2.6  --  2.7 2.3*   Sepsis Markers No results for input(s): LATICACIDVEN, PROCALCITON, O2SATVEN  in the last 168 hours. ABG  Recent Labs Lab 09/11/15 0916 09/11/15 1759 09/14/15 0938  PHART 7.44 7.36 7.48*  PCO2ART 34 43 27*  PO2ART 67* 67* 112*   Liver Enzymes  Recent Labs Lab 09/12/15 0343  ALBUMIN 1.7*   Cardiac Enzymes No results for input(s): TROPONINI, PROBNP in the last 168 hours. Glucose  Recent Labs Lab 09/14/15 1629 09/14/15 1952 09/14/15 2357 09/15/15 0400 09/15/15 0737 09/15/15 1125  GLUCAP 123* 118* 111* 107* 108* 75    Imaging Dg Chest Port 1 View  09/15/2015  CLINICAL DATA:  Acute respiratory failure.  ALS. EXAM: PORTABLE CHEST 1 VIEW  COMPARISON:  Chest radiograph from one day prior. FINDINGS: Endotracheal tube tip is 2.1 cm above the carina. Enteric tube terminates in the proximal stomach. Left internal jugular central venous catheter terminates in the lower third of the superior vena cava. Stable cardiomediastinal silhouette with normal heart size. No pneumothorax. No pleural effusion. Low lung volumes with linear and patchy opacities in the mid to lower lungs bilaterally, stable. IMPRESSION: 1. Support hardware as described. 2. No pneumothorax. 3. Stable low lung volumes with curvilinear and patchy opacities in the mid to lower lungs bilaterally, unchanged, favor atelectasis and/or pneumonia. Electronically Signed   By: Delbert Phenix M.D.   On: 09/15/2015 09:09    Cultures: BCx2  UC 2/10-VRE Sputum MRSA PCR negative C. Difficile-antigen positive Influenza panel negative 02/15:BAL>NTD  STUDIES:   None   SIGNIFICANT EVENTS: 2/15>> intubated, bronchoscopy BAL-Cultures negative todate  Antibiotics: Vancomycin 02/14>>-Oral linezolid 02/15>> Zosyn 02/15>>   Lines [facility] CVL 2/15>> Foley  ASSESSMENT / PLAN: 55 year old female past medical history of ALS, chronic Foley catheter, recurrent UTI,initially admitted on 2/8 weakness, hallucination, secondary to possible UTI/sepsis, hospitalization complicated with C. Difficile, respiratory failure requiring intubation, now with slow respiratory improvement.   A: PULMONARY OETT 8.0 Respiratory failure-ventilator dependent ALS stage 4 Right lower lobe atelectasis/pneumonia P:  -Continue mechanical ventilation, wean as tolerated, keep O2 saturations greater than 80%; Patient will probably need a trach prior to discharge -SBT/WUA daily  -MIP with every weaning trial -CXR daily -ABG in am -f/u 2/15 BAL cultures.   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 Difficile antigen Sepsis P:  - Continue antibiotics as directed by infectious disease - Currently on linezolid, zosyn and oral vancomycin  A: ENDOCRINE Elevated blood glucose without diabetes P -ICU hypo/ hyperglycemic protocol  A: NEUROLOGIC ALS Seizure disorder Anxiety P:  RASS goal:0 to -1 -Continue with ALS medications, bed bound at baseline, able to sit without assistance, able to verbally communicate at baseline. -Currently since she is on linezolid, her nuedexta is on hold secondary to drug-drug interaction causing serotonin syndrome -Continue with antidepressants and muscle relaxants -Continue her Keppra -low dose precedex for mild agitation  -Continue  home dose of valium  Family update/Disposition: Patient's husband at bedside. Updated on failed extubation trial and overall patient's status today. 02/18:  Husband indicated that the last time she was at her ALS doctor's office at [facility], her "breathing was  great". A review of her [facility] records indicate that she hasn't been there for a visit in the last 6 months. At her last visit, her FVC was ~70%. Disease progression and the subsequent need for artificial ventilation had been reviewed with patient and her family by her ALS doctor. We did relay to the family that this might be the new baseline for patient incase we are unable to extubate her.     Magdalene S. Tukov ANP-BC Pulmonary and Critical Care Medicine [facility] Pager [phone]  The patient was  seen and examined by myself with the nurse practitioner, I agree with her note as above except recommended. I have reviewed and took part in formulating the assessment and plan.  The patient was admitted with UTI with septic shock, subsequently intubated. She continues to have a low negative inspiratory force of -20. Today once again, she was placed on a weaning trial, but she failed due to tachypnea of 40/m, similar to yesterday's results. She was placed back on PRBC, where her respiratory rate once again decreased to 20-25/m I discussed with the husband yesterday regarding the patient's respiratory status, we will continue to see if she can be weaned, however, as we have not been making progress, we need to consider the possibility that the patient's inability to wean is due to worsening ALS. I am not certain that he is accepting of this possibility.  Wells Guiles, M.D.   Critical Care Attestation.  I have personally obtained a history, examined the patient, evaluated laboratory and imaging results, formulated the assessment and plan and placed orders. The Patient requires high complexity decision making for assessment and support, frequent evaluation and titration of therapies, application of advanced monitoring technologies and extensive interpretation of multiple databases. The patient has critical illness that could lead imminently to failure of 1 or more organ systems and requires the highest level of physician preparedness to intervene.  Critical Care Time devoted to patient care services described in this note is 35 minutes and is exclusive of time spent in procedures.

## 2015-09-15 NOTE — Progress Notes (Signed)
NP, Magda placed patient back on PRVC settings at some point this am after 0800 vent check.

## 2015-09-15 NOTE — Progress Notes (Signed)
PHARMACY - CRITICAL CARE PROGRESS NOTE  Pharmacy Consult for electrolyte management      Allergies  Allergen Reactions  . Bee Venom Anaphylaxis  . Hydrocodone Other (See Comments)    Reaction:  Muscle stiffness   . Lipitor [Atorvastatin] Other (See Comments)    Reaction:  Muscle cramps   . Shellfish Allergy Anaphylaxis  . Oxycodone Other (See Comments)    Reaction:  Muscle stiffness   . Prednisone Other (See Comments)    Reaction:  Psychosis   . Vicodin [Hydrocodone-Acetaminophen] Other (See Comments)    Reaction:  Muscle stiffness   . Oxycontin [Oxycodone Hcl] Other (See Comments)    Reaction:  Muscle stiffness   . Peanut-Containing Drug Products Diarrhea    Patient Measurements: Height:  (157.5 cm) Weight: 117 lb 4.6 oz (53.2 kg) IBW/kg (Calculated) : 50.1  Vital Signs: Temp: 98.3 F (36.8 C) (02/19 0400) Temp Source: Axillary (02/19 0400) BP: 98/84 mmHg (02/19 0400) Pulse Rate: 82 (02/19 0408) Intake/Output from previous day: 02/18 0701 - 02/19 0700 In: 713.5 [I.V.:103.5; IV Piggyback:610] Out: 226 [Urine:225; Stool:1] Intake/Output from this shift: Total I/O In: 713.5 [I.V.:103.5; IV Piggyback:610] Out: -  Vent settings for last 24 hours: Vent Mode:  [-] PRVC FiO2 (%):  [30 %] 30 % Set Rate:  [15 bmp] 15 bmp Vt Set:  [400 mL] 400 mL PEEP:  [5 cmH20] 5 cmH20 Plateau Pressure:  [22 cmH20-25 cmH20] 22 cmH20  Labs:  Recent Labs  09/13/15 0505 09/14/15 0500 09/15/15 0502  WBC 17.1*  --  15.8*  HGB 9.4*  --  8.0*  HCT 28.8*  --  24.1*  PLT 440  --  366  CREATININE 0.40* 0.32* 0.31*  MG  --  1.6* 2.2  PHOS  --  2.7 2.3*   Estimated Creatinine Clearance: 63.6 mL/min (by C-G formula based on Cr of 0.31).   Recent Labs  09/14/15 1952 09/14/15 2357 09/15/15 0400  GLUCAP 118* 111* 107*    Microbiology: Recent Results (from the past 720 hour(s))  Urine culture     Status: None   Collection Time: 09/04/15  9:01 PM  Result Value Ref Range  Status   Specimen Description URINE, RANDOM  Final   Special Requests NONE  Final   Culture MULTIPLE SPECIES PRESENT, SUGGEST RECOLLECTION  Final   Report Status 09/06/2015 FINAL  Final  Blood Culture (routine x 2)     Status: None   Collection Time: 09/04/15  9:41 PM  Result Value Ref Range Status   Specimen Description BLOOD RIGHT ARM  Final   Special Requests BOTTLES DRAWN AEROBIC AND ANAEROBIC  Final   Culture NO GROWTH 5 DAYS  Final   Report Status 09/09/2015 FINAL  Final  Blood Culture (routine x 2)     Status: None   Collection Time: 09/04/15  9:41 PM  Result Value Ref Range Status   Specimen Description BLOOD LEFT ARM  Final   Special Requests BOTTLES DRAWN AEROBIC AND ANAEROBIC  Final   Culture NO GROWTH 5 DAYS  Final   Report Status 09/09/2015 FINAL  Final  Urine culture     Status: None   Collection Time: 09/06/15  4:58 PM  Result Value Ref Range Status   Specimen Description URINE, CATHETERIZED  Final   Special Requests ertapenem and vanco Normal  Final   Culture   Final    >=100,000 COLONIES/mL VANCOMYCIN RESISTANT ENTEROCOCCUS ENTEROCOCCUS FAECIUM VRE HAVE INTRINSIC RESISTANCE TO MOST COMMONLY USED ANTIBIOTICS  AND THE ABILITY TO ACQUIRE RESISTANCE TO MOST AVAILABLE ANTIBIOTICS. WITH MIXED BACTERIAL ORGANISMS    Report Status 09/10/2015 FINAL  Final   Organism ID, Bacteria VANCOMYCIN RESISTANT ENTEROCOCCUS  Final      Susceptibility   Vancomycin resistant enterococcus - MIC*    AMPICILLIN >=32 RESISTANT Resistant     LEVOFLOXACIN >=8 RESISTANT Resistant     NITROFURANTOIN 128 RESISTANT Resistant     VANCOMYCIN >=32 RESISTANT Resistant     LINEZOLID 2 SENSITIVE Sensitive     * >=100,000 COLONIES/mL VANCOMYCIN RESISTANT ENTEROCOCCUS  C difficile quick scan w PCR reflex     Status: Abnormal   Collection Time: 09/09/15  4:14 PM  Result Value Ref Range Status   C Diff antigen POSITIVE (A) NEGATIVE Final   C Diff toxin NEGATIVE NEGATIVE Final   C Diff  interpretation   Final    Positive for toxigenic C. difficile, active toxin production not detected. Patient has toxigenic C. difficile organisms present in the bowel, but toxin was not detected. The patient may be a carrier or the level of toxin in the sample was below the limit  of detection. This information should be used in conjunction with the patient's clinical history when deciding on possible therapy.     Comment: CRITICAL RESULT CALLED TO, READ BACK BY AND VERIFIED WITH: Burnett Corrente ON 09/09/15 AT 2019 BY TLB   Clostridium Difficile by PCR     Status: Abnormal   Collection Time: 09/09/15  4:14 PM  Result Value Ref Range Status   Toxigenic C Difficile by pcr POSITIVE (A) NEGATIVE Final    Comment: CRITICAL RESULT CALLED TO, READ BACK BY AND VERIFIED WITH: Burnett Corrente ON 09/09/15 AT 2019 BY TLB   MRSA PCR Screening     Status: None   Collection Time: 09/11/15  6:52 AM  Result Value Ref Range Status   MRSA by PCR NEGATIVE NEGATIVE Final    Comment:        The GeneXpert MRSA Assay (FDA approved for NASAL specimens only), is one component of a comprehensive MRSA colonization surveillance program. It is not intended to diagnose MRSA infection nor to guide or monitor treatment for MRSA infections.   Culture, bal-quantitative     Status: None   Collection Time: 09/11/15  2:21 PM  Result Value Ref Range Status   Specimen Description BRONCHIAL ALVEOLAR LAVAGE  Final   Special Requests Normal BRONCHIAL BRUSHING  Final   Gram Stain   Final    FEW WBC SEEN RARE SQUAMOUS EPITHELIAL CELLS PRESENT NO ORGANISMS SEEN    Culture Consistent with normal respiratory flora.  Final   Report Status 09/13/2015 FINAL  Final  Fungus Culture with Smear     Status: None (Preliminary result)   Collection Time: 09/11/15  2:21 PM  Result Value Ref Range Status   Specimen Description BRONCHIAL BRUSHING  Final   Special Requests Normal  Final   Culture YEAST IDENTIFICATION TO FOLLOW    Final   Report Status PENDING  Incomplete    Medications:  Scheduled:  . antiseptic oral rinse  7 mL Mouth Rinse QID  . chlorhexidine gluconate  15 mL Mouth Rinse BID  . enoxaparin (LOVENOX) injection  40 mg Subcutaneous Q24H  . famotidine (PEPCID) IV  20 mg Intravenous Q12H  . feeding supplement (VITAL HIGH PROTEIN)  1,000 mL Per Tube Q24H  . free water  150 mL Per Tube 4 times per day  . levETIRAcetam  500 mg Intravenous  Q12H  . linezolid (ZYVOX) IV  600 mg Intravenous Q12H  . midazolam  4 mg Intravenous Once  . phenylephrine  1 suppository Rectal BID  . piperacillin-tazobactam (ZOSYN)  IV  3.375 g Intravenous 3 times per day  . potassium & sodium phosphates  1 packet Oral BID WC  . potassium chloride  10 mEq Intravenous Q1 Hr x 4  . potassium chloride  40 mEq Per Tube Once  . tiZANidine  2 mg Oral QHS  . vancomycin  125 mg Oral 4 times per day   Infusions:  . dexmedetomidine 0.6 mcg/kg/hr (09/14/15 1147)  . norepinephrine (LEVOPHED) Adult infusion Stopped (09/13/15 1200)    Assessment: Pharmacy consulted for electrolyte management for 55 yo female ICU patient requiring mechanical ventilation.     Plan:  K 3.1, Mg 1.6, Phos 2.3. Potassium chloride 10 mEq IV Q1H x 4 doses and Phos-Nak 1 packet BID with meals x 2 doses. Will recheck with AM labs.    Carola Frost, Pharm.D., BCPS Clinical Pharmacist 09/15/2015,5:50 AM

## 2015-09-15 NOTE — Progress Notes (Signed)
[facility] - [place] at [facility]   PATIENT NAME: Jennifer [name]    MR#:  [id number]  DATE OF BIRTH:  1961/04/02  SUBJECTIVE:  CHIEF COMPLAINT:   Chief Complaint  Patient presents with  . Altered Mental Status   - failing trial for last 2 days, tolerating for 2-3 hrs then gets tired. Patient is awake and alert today REVIEW OF SYSTEMS:  Review of Systems  Unable to perform ROS: other    She nodes No to any complains or pains, Detailed ROS not possible due to her being on ventilator.  DRUG ALLERGIES:   Allergies  Allergen Reactions  . Bee Venom Anaphylaxis  . Hydrocodone Other (See Comments)    Reaction:  Muscle stiffness   . Lipitor [Atorvastatin] Other (See Comments)    Reaction:  Muscle cramps   . Shellfish Allergy Anaphylaxis  . Oxycodone Other (See Comments)    Reaction:  Muscle stiffness   . Prednisone Other (See Comments)    Reaction:  Psychosis   . Vicodin [Hydrocodone-Acetaminophen] Other (See Comments)    Reaction:  Muscle stiffness   . Oxycontin [Oxycodone Hcl] Other (See Comments)    Reaction:  Muscle stiffness   . Peanut-Containing Drug Products Diarrhea    VITALS:  Blood pressure 103/73, pulse 95, temperature 98.3 F (36.8 C), temperature source Axillary, resp. rate 16, height  (1.575 m), weight 52.5 kg (115 lb 11.9 oz), SpO2 100 %.  PHYSICAL EXAMINATION:  Physical Exam  GENERAL:  55 y.o.-year-old patient lying in the bed with respiratory distress, on ventilator, Patient has ALS. Appears contracted in bed EYES: Pupils equal, round, reactive to light and accommodation. No scleral icterus. HEENT: Head atraumatic, normocephalic. Oropharynx and nasopharynx clear.  NECK:  Supple, no jugular venous distention. No thyroid enlargement, no tenderness.  LUNGS: Diminished and coarse breath sounds bilaterally, no use of accessory muscles of respiration. Minimal rales and rhonchi , ETT , on vent support. CARDIOVASCULAR: S1, S2  normal. No murmurs, rubs, or gallops.  ABDOMEN: Soft, nontender, nondistended. Bowel sounds present. No organomegaly or mass.  EXTREMITIES: No pedal edema, cyanosis, or clubbing.  NEUROLOGIC: Lying in bed, contracted appearing, .not following commands, PSYCHIATRIC: Could not be elicited as the patient is on ventilator support. SKIN: No obvious rash, lesion, or ulcer.    LABORATORY PANEL:   CBC  Recent Labs Lab 09/15/15 0502  WBC 15.8*  HGB 8.0*  HCT 24.1*  PLT 366   ------------------------------------------------------------------------------------------------------------------  Chemistries   Recent Labs Lab 09/15/15 0502  NA 138  K 3.1*  CL 110  CO2 22  GLUCOSE 115*  BUN 15  CREATININE 0.31*  CALCIUM 7.2*  MG 2.2   ------------------------------------------------------------------------------------------------------------------  Cardiac Enzymes No results for input(s): TROPONINI in the last 168 hours. ------------------------------------------------------------------------------------------------------------------  RADIOLOGY:  Dg Chest Port 1 View  09/15/2015  CLINICAL DATA:  Acute respiratory failure.  ALS. EXAM: PORTABLE CHEST 1 VIEW COMPARISON:  Chest radiograph from one day prior. FINDINGS: Endotracheal tube tip is 2.1 cm above the carina. Enteric tube terminates in the proximal stomach. Left internal jugular central venous catheter terminates in the lower third of the superior vena cava. Stable cardiomediastinal silhouette with normal heart size. No pneumothorax. No pleural effusion. Low lung volumes with linear and patchy opacities in the mid to lower lungs bilaterally, stable. IMPRESSION: 1. Support hardware as described. 2. No pneumothorax. 3. Stable low lung volumes with curvilinear and patchy opacities in the mid to lower lungs bilaterally, unchanged, favor atelectasis and/or pneumonia.  Electronically Signed   By: Delbert Phenix M.D.   On: 09/15/2015 09:09    Dg Chest Port 1 View  09/14/2015  CLINICAL DATA:  Respiratory failure, ventilatory support EXAM: PORTABLE CHEST 1 VIEW COMPARISON:  09/11/2015 FINDINGS: Endotracheal tube 2.4 cm above the carina. Left IJ central line tip SVC RA junction. NGT within the stomach. Very low lung volumes persist with improving bilateral airspace disease versus edema. No enlarging effusion or pneumothorax. Stable diffuse gaseous distention of the bowel, suspect mild ileus. No acute osseous finding. IMPRESSION: Low lung volumes but improving diffuse airspace process versus edema. Electronically Signed   By: Judie Petit.  Shick M.D.   On: 09/14/2015 12:14     ASSESSMENT AND PLAN:   55 year old female with known history of ALS with chronic Foley catheter and prior history of multiple UTIs, depression, GERD was brought in secondary to altered mental status.   #Acute hypoxic respiratory failure probably secondary to aspiration pneumonia/sepsis and ALS  intubated 09/11/2015 by intensivist Status post bronchoscopy and BAL 09/11/2015-cultures are pending SBT trials failed again. Central line is placed 2/15 On tube feeds Continue current antibiotics Appreciate pulmonologist input   # Metabolic encephalopathy-secondary to sepsis -Leukocytosis is trending down -Continue current IV antibiotic Zyvox, zosyn  and  Oral Vancomycin, per ID recommendations  Sepsis- VRE UTI , C. difficile and pneumonia now. -ID consult appreciated.  -- Influenza test is negative.   #3 hypokalemia-known history of chronic hypokalemia and takes potassium supplements at home. Replace as needed   #4 seizure disorder-continue Keppra    #5 ALS-following with neurologist at [facility]. -Bed bound at baseline, but still able to sit with assistance and feed herself and alert and interactive. -Continue to monitor as patient is not close to her baseline. Also takes antidepressants and muscle relaxants for the same.  #6 Dysphagia- was evaluated by speech  therapy and was on dysphagia diet Currently nothing by mouth, ON tube feeds  #7 Severe Malnutrition- dietary consulted Started tube feeds while patient is nothing by mouth and on ventilator  #7 DVT prophylaxis-on Lovenox  #8 C diff- oral vanc. Via tube  #9 Pneumonia, probably from aspiration   Linezolid , zosyn Discussed with Dr. Sampson Goon ID, appreciate his recommendations   All the records are reviewed and case discussed with Care Management/Social Workerr. Management plans discussed with the patient's husband at bedside, aware of the plan  CODE STATUS: Full Code  TOTAL CRITICAL CARE TIME TAKING CARE OF THIS PATIENT: 35 minutes.   POSSIBLE D/C IN ? DAYS, DEPENDING ON CLINICAL CONDITION.   Altamese Dilling M.D on 09/15/2015 at 10:26 PM  Between 7am to 6pm - Pager - [phone]  After 6pm go to [email or web address] - password [email or web address] [facility]  [facility] [facility]  Office  [phone]  CC: Primary care physician; Ruthe Mannan, MD

## 2015-09-15 NOTE — Progress Notes (Signed)
eLink Physician-Brief Progress Note Patient Name: Jennifer [name] DOB: 1960/12/24 MRN: [id number]   Date of Service  09/15/2015  HPI/Events of Note  Anxiety - On ventilator. Precedex IV infusion order expired.   eICU Interventions  Will renew Precedex IV infusion order.     Intervention Category Minor Interventions: Agitation / anxiety - evaluation and management  Sherrel Shafer Eugene 09/15/2015, 9:15 PM

## 2015-09-15 NOTE — Progress Notes (Signed)
[facility] - [place] at [facility]   PATIENT NAME: Jennifer [name]    MR#:  [id number]  DATE OF BIRTH:  1961-05-28  SUBJECTIVE:  CHIEF COMPLAINT:   Chief Complaint  Patient presents with  . Altered Mental Status   - failing trial for last 2 days, tolerating for 2-3 hrs then gets tired.  Today again - RR increased after one hour of trial.   Has bowel distension and constipation now. Feeding held. Patient is awake and alert today REVIEW OF SYSTEMS:  Review of Systems  Unable to perform ROS: other    She nodes No to any complains or pains, Detailed ROS not possible due to her being on ventilator.  DRUG ALLERGIES:   Allergies  Allergen Reactions  . Bee Venom Anaphylaxis  . Hydrocodone Other (See Comments)    Reaction:  Muscle stiffness   . Lipitor [Atorvastatin] Other (See Comments)    Reaction:  Muscle cramps   . Shellfish Allergy Anaphylaxis  . Oxycodone Other (See Comments)    Reaction:  Muscle stiffness   . Prednisone Other (See Comments)    Reaction:  Psychosis   . Vicodin [Hydrocodone-Acetaminophen] Other (See Comments)    Reaction:  Muscle stiffness   . Oxycontin [Oxycodone Hcl] Other (See Comments)    Reaction:  Muscle stiffness   . Peanut-Containing Drug Products Diarrhea    VITALS:  Blood pressure 103/73, pulse 95, temperature 98.3 F (36.8 C), temperature source Axillary, resp. rate 16, height  (1.575 m), weight 52.5 kg (115 lb 11.9 oz), SpO2 100 %.  PHYSICAL EXAMINATION:  Physical Exam  GENERAL:  55 y.o.-year-old patient lying in the bed with respiratory distress, on ventilator, Patient has ALS. Appears contracted in bed EYES: Pupils equal, round, reactive to light and accommodation. No scleral icterus. HEENT: Head atraumatic, normocephalic. Oropharynx and nasopharynx clear.  NECK:  Supple, no jugular venous distention. No thyroid enlargement, no tenderness.  LUNGS: Diminished and coarse breath sounds bilaterally, no use of  accessory muscles of respiration. Minimal rales and rhonchi , ETT , on vent support. CARDIOVASCULAR: S1, S2 normal. No murmurs, rubs, or gallops.  ABDOMEN: Soft, nontender, nondistended. Bowel sounds present. No organomegaly or mass.  EXTREMITIES: No pedal edema, cyanosis, or clubbing.  NEUROLOGIC: Lying in bed, contracted appearing, .not following commands, PSYCHIATRIC: Could not be elicited as the patient is on ventilator support. SKIN: No obvious rash, lesion, or ulcer.    LABORATORY PANEL:   CBC  Recent Labs Lab 09/15/15 0502  WBC 15.8*  HGB 8.0*  HCT 24.1*  PLT 366   ------------------------------------------------------------------------------------------------------------------  Chemistries   Recent Labs Lab 09/15/15 0502  NA 138  K 3.1*  CL 110  CO2 22  GLUCOSE 115*  BUN 15  CREATININE 0.31*  CALCIUM 7.2*  MG 2.2   ------------------------------------------------------------------------------------------------------------------  Cardiac Enzymes No results for input(s): TROPONINI in the last 168 hours. ------------------------------------------------------------------------------------------------------------------  RADIOLOGY:  Dg Chest Port 1 View  09/15/2015  CLINICAL DATA:  Acute respiratory failure.  ALS. EXAM: PORTABLE CHEST 1 VIEW COMPARISON:  Chest radiograph from one day prior. FINDINGS: Endotracheal tube tip is 2.1 cm above the carina. Enteric tube terminates in the proximal stomach. Left internal jugular central venous catheter terminates in the lower third of the superior vena cava. Stable cardiomediastinal silhouette with normal heart size. No pneumothorax. No pleural effusion. Low lung volumes with linear and patchy opacities in the mid to lower lungs bilaterally, stable. IMPRESSION: 1. Support hardware as described. 2. No pneumothorax. 3.  Stable low lung volumes with curvilinear and patchy opacities in the mid to lower lungs bilaterally, unchanged,  favor atelectasis and/or pneumonia. Electronically Signed   By: Delbert Phenix M.D.   On: 09/15/2015 09:09   Dg Chest Port 1 View  09/14/2015  CLINICAL DATA:  Respiratory failure, ventilatory support EXAM: PORTABLE CHEST 1 VIEW COMPARISON:  09/11/2015 FINDINGS: Endotracheal tube 2.4 cm above the carina. Left IJ central line tip SVC RA junction. NGT within the stomach. Very low lung volumes persist with improving bilateral airspace disease versus edema. No enlarging effusion or pneumothorax. Stable diffuse gaseous distention of the bowel, suspect mild ileus. No acute osseous finding. IMPRESSION: Low lung volumes but improving diffuse airspace process versus edema. Electronically Signed   By: Judie Petit.  Shick M.D.   On: 09/14/2015 12:14     ASSESSMENT AND PLAN:   55 year old female with known history of ALS with chronic Foley catheter and prior history of multiple UTIs, depression, GERD was brought in secondary to altered mental status.   #Acute hypoxic respiratory failure probably secondary to aspiration pneumonia/sepsis and ALS  intubated 09/11/2015 by intensivist Status post bronchoscopy and BAL 09/11/2015-cultures negative for bacteria, but growing yeast. SBT trials failed again. Central line is placed 2/15 On tube feeds Continue current antibiotics Appreciate pulmonologist input   # Metabolic encephalopathy-secondary to sepsis -Leukocytosis is trending down -Continue current IV antibiotic Zyvox, zosyn  and  Oral Vancomycin, per ID recommendations  Sepsis- VRE UTI , C. difficile and pneumonia now. -ID consult appreciated.  -- Influenza test is negative.   # c diff   On vanc via tube. Now diarrhea resolved, have constipation  # hypokalemia-known history of chronic hypokalemia and takes potassium supplements at home. Replace as needed   # seizure disorder-continue Keppra    # ALS-following with neurologist at [facility]. -Bed bound at baseline, but still able to sit with assistance and feed  herself and alert and interactive. -Continue to monitor as patient is not close to her baseline. Also takes antidepressants and muscle relaxants for the same.  # Dysphagia- was evaluated by speech therapy and was on dysphagia diet Currently nothing by mouth, ON tube feeds  # Severe Malnutrition- dietary consulted Started tube feeds while patient is nothing by mouth and on ventilator  # DVT prophylaxis-on Lovenox  # C diff- oral vanc. Via tube  # Pneumonia, probably from aspiration   Linezolid , zosyn Discussed with Dr. Sampson Goon ID, appreciate his recommendations   All the records are reviewed and case discussed with Care Management/Social Workerr. Management plans discussed with the patient's husband at bedside, aware of the plan  CODE STATUS: Full Code  TOTAL CRITICAL CARE TIME TAKING CARE OF THIS PATIENT: 35 minutes.   POSSIBLE D/C IN ? DAYS, DEPENDING ON CLINICAL CONDITION.   Altamese Dilling M.D on 09/15/2015 at 10:38 PM  Between 7am to 6pm - Pager - [phone]  After 6pm go to [email or web address] - password [email or web address] [facility]  [place] [facility]  Office  [phone]  CC: Primary care physician; Ruthe Mannan, MD

## 2015-09-16 ENCOUNTER — Ambulatory Visit: Payer: Self-pay | Admitting: Infectious Diseases

## 2015-09-16 ENCOUNTER — Inpatient Hospital Stay: Payer: BLUE CROSS/BLUE SHIELD

## 2015-09-16 LAB — MAGNESIUM: MAGNESIUM: 1.9 mg/dL (ref 1.7–2.4)

## 2015-09-16 LAB — BLOOD GAS, ARTERIAL
ACID-BASE DEFICIT: 2.6 mmol/L — AB (ref 0.0–2.0)
Allens test (pass/fail): POSITIVE — AB
BICARBONATE: 20.2 meq/L — AB (ref 21.0–28.0)
FIO2: 0.3
LHR: 15 {breaths}/min
Mechanical Rate: 15
O2 Saturation: 98.4 %
PCO2 ART: 29 mmHg — AB (ref 32.0–48.0)
PEEP: 5 cmH2O
PH ART: 7.45 (ref 7.350–7.450)
Patient temperature: 37
VT: 400 mL
pO2, Arterial: 108 mmHg (ref 83.0–108.0)

## 2015-09-16 LAB — BASIC METABOLIC PANEL
ANION GAP: 3 — AB (ref 5–15)
BUN: 13 mg/dL (ref 6–20)
CALCIUM: 7.4 mg/dL — AB (ref 8.9–10.3)
CO2: 24 mmol/L (ref 22–32)
Chloride: 109 mmol/L (ref 101–111)
Creatinine, Ser: 0.39 mg/dL — ABNORMAL LOW (ref 0.44–1.00)
Glucose, Bld: 112 mg/dL — ABNORMAL HIGH (ref 65–99)
POTASSIUM: 3.5 mmol/L (ref 3.5–5.1)
SODIUM: 136 mmol/L (ref 135–145)

## 2015-09-16 LAB — CBC
HCT: 24.3 % — ABNORMAL LOW (ref 35.0–47.0)
Hemoglobin: 7.9 g/dL — ABNORMAL LOW (ref 12.0–16.0)
MCH: 30.3 pg (ref 26.0–34.0)
MCHC: 32.5 g/dL (ref 32.0–36.0)
MCV: 93.2 fL (ref 80.0–100.0)
PLATELETS: 365 10*3/uL (ref 150–440)
RBC: 2.61 MIL/uL — AB (ref 3.80–5.20)
RDW: 15.1 % — AB (ref 11.5–14.5)
WBC: 13.4 10*3/uL — AB (ref 3.6–11.0)

## 2015-09-16 LAB — GLUCOSE, CAPILLARY
GLUCOSE-CAPILLARY: 89 mg/dL (ref 65–99)
Glucose-Capillary: 118 mg/dL — ABNORMAL HIGH (ref 65–99)
Glucose-Capillary: 101 mg/dL — ABNORMAL HIGH (ref 65–99)
Glucose-Capillary: 92 mg/dL (ref 65–99)
Glucose-Capillary: 90 mg/dL (ref 65–99)
Glucose-Capillary: 90 mg/dL (ref 65–99)

## 2015-09-16 LAB — PHOSPHORUS: PHOSPHORUS: 3.4 mg/dL (ref 2.5–4.6)

## 2015-09-16 MED ORDER — FENTANYL CITRATE (PF) 100 MCG/2ML IJ SOLN
25.0000 ug | INTRAMUSCULAR | Status: DC | PRN
  Administered 2015-09-16: 50 ug via INTRAVENOUS
  Administered 2015-09-16: 25 ug via INTRAVENOUS
  Filled 2015-09-16 (×2): qty 2

## 2015-09-16 MED ORDER — METRONIDAZOLE IN NACL 5-0.79 MG/ML-% IV SOLN
500.0000 mg | Freq: Three times a day (TID) | INTRAVENOUS | Status: DC
  Administered 2015-09-16 – 2015-09-19 (×9): 500 mg via INTRAVENOUS
  Filled 2015-09-16 (×11): qty 100

## 2015-09-16 MED ORDER — SODIUM CHLORIDE 0.9 % IV BOLUS (SEPSIS): 1000.0000 mL | Freq: Once | INTRAVENOUS | Status: DC

## 2015-09-16 MED ORDER — DEXTROMETHORPHAN-QUINIDINE 20-10 MG PO CAPS
1.0000 | ORAL_CAPSULE | Freq: Two times a day (BID) | ORAL | Status: DC
  Administered 2015-09-16 – 2015-09-17 (×3): 1 via ORAL
  Filled 2015-09-16: qty 1

## 2015-09-16 MED ORDER — ERYTHROMYCIN ETHYLSUCCINATE 200 MG/5ML PO SUSR
200.0000 mg | Freq: Three times a day (TID) | ORAL | Status: DC
  Administered 2015-09-16 – 2015-09-18 (×6): 200 mg
  Filled 2015-09-16 (×9): qty 5

## 2015-09-16 MED ORDER — VANCOMYCIN HCL 500 MG IV SOLR
500.0000 mg | Freq: Four times a day (QID) | Status: DC
  Administered 2015-09-16 – 2015-09-19 (×7): 500 mg via RECTAL
  Filled 2015-09-16 (×17): qty 500

## 2015-09-16 NOTE — Care Management (Signed)
Discussed during progression that a discussion should occur with family regarding long plans if patient is extubated and fails.  She is a full code. May need to consider home ventilator

## 2015-09-16 NOTE — Progress Notes (Signed)
Pharmacy Antibiotic Note; Day 5 of Zosyn therapy   Jennifer [name] is a 55 y.o. female admitted on 09/04/2015 with UTI/sepsis.  Pharmacy has been consulted for Zosyn dosing per ID for possible HAP/Aspiration PNA. Patient with urine culture growing VRE on Linezolid.   Plan:  Continue Zosyn EI 3.375g IV Q8hr. Will continue to follow for signs of infection resolution.  Height:  (157.5 cm) Weight: 122 lb 5.7 oz (55.5 kg) IBW/kg (Calculated) : 50.1  Temp (24hrs), Avg:97.9 F (36.6 C), Min:97.9 F (36.6 C), Max:97.9 F (36.6 C)   Recent Labs Lab 09/11/15 0532 09/12/15 0343 09/13/15 0505 09/14/15 0500 09/15/15 0502 09/16/15 0443  WBC 23.7* 26.7* 17.1*  --  15.8* 13.4*  CREATININE 0.30* 0.38* 0.40* 0.32* 0.31* 0.39*    Estimated Creatinine Clearance: 63.6 mL/min (by C-G formula based on Cr of 0.39).    Allergies  Allergen Reactions  . Bee Venom Anaphylaxis  . Hydrocodone Other (See Comments)    Reaction:  Muscle stiffness   . Lipitor [Atorvastatin] Other (See Comments)    Reaction:  Muscle cramps   . Shellfish Allergy Anaphylaxis  . Oxycodone Other (See Comments)    Reaction:  Muscle stiffness   . Prednisone Other (See Comments)    Reaction:  Psychosis   . Vicodin [Hydrocodone-Acetaminophen] Other (See Comments)    Reaction:  Muscle stiffness   . Oxycontin [Oxycodone Hcl] Other (See Comments)    Reaction:  Muscle stiffness   . Peanut-Containing Drug Products Diarrhea   Antibiotics:  Zosyn 02/08 >> 02/08 Vancomycin 02/08 >> 02/10 Ertapenem 02/09 >> 02/10 Daptomycin 02/10 >> 02/13 Meropenem 02/10 >> 02/12 Metronidazole 02/13 >> 02/14 Linezolid 02/14 >> Oral vancomycin 02/14 >> Zosyn 02/15 >>  Microbiology: 02/08 Urine cx: multiple species 02/08 Blood cx: NG 02/10 Urine cx: VRE 02/13 C diff: positive 02/15 MRSA PCR: negative 02/15 BAL: pending 02/15 Fungal: NGTD 02/15 Viral: pending   Pharmacy will continue to monitor and adjust per consult.    Braxon Suder D 09/16/2015 8:20 AM

## 2015-09-16 NOTE — Progress Notes (Signed)
PHARMACY - CRITICAL CARE PROGRESS NOTE  Pharmacy Consult for electrolyte management      Allergies  Allergen Reactions  . Bee Venom Anaphylaxis  . Hydrocodone Other (See Comments)    Reaction:  Muscle stiffness   . Lipitor [Atorvastatin] Other (See Comments)    Reaction:  Muscle cramps   . Shellfish Allergy Anaphylaxis  . Oxycodone Other (See Comments)    Reaction:  Muscle stiffness   . Prednisone Other (See Comments)    Reaction:  Psychosis   . Vicodin [Hydrocodone-Acetaminophen] Other (See Comments)    Reaction:  Muscle stiffness   . Oxycontin [Oxycodone Hcl] Other (See Comments)    Reaction:  Muscle stiffness   . Peanut-Containing Drug Products Diarrhea    Patient Measurements: Height:  (157.5 cm) Weight: 122 lb 5.7 oz (55.5 kg) IBW/kg (Calculated) : 50.1  Vital Signs: Temp: 97.9 F (36.6 C) (02/19 2000) Temp Source: Axillary (02/19 2000) BP: 64/49 mmHg (02/20 0430) Pulse Rate: 69 (02/20 0430) Intake/Output from previous day: 02/19 0701 - 02/20 0700 In: 4072 [I.V.:1044.5; NG/GT:1567.5; IV Piggyback:1460] Out: 400 [Urine:400] Intake/Output from this shift: Total I/O In: 2504.5 [I.V.:1044.5; IV Piggyback:1460] Out: -  Vent settings for last 24 hours: Vent Mode:  [-] PRVC FiO2 (%):  [30 %] 30 % Set Rate:  [15 bmp] 15 bmp Vt Set:  [400 mL] 400 mL PEEP:  [5 cmH20] 5 cmH20 Plateau Pressure:  [21 cmH20-22 cmH20] 21 cmH20  Labs:  Recent Labs  09/14/15 0500 09/15/15 0502 09/16/15 0443  WBC  --  15.8* 13.4*  HGB  --  8.0* 7.9*  HCT  --  24.1* 24.3*  PLT  --  366 365  CREATININE 0.32* 0.31* 0.39*  MG 1.6* 2.2 1.9  PHOS 2.7 2.3* 3.4   Estimated Creatinine Clearance: 63.6 mL/min (by C-G formula based on Cr of 0.39).   Recent Labs  09/15/15 1950 09/15/15 2357 09/16/15 0343  GLUCAP 83 81 118*    Microbiology: Recent Results (from the past 720 hour(s))  Urine culture     Status: None   Collection Time: 09/04/15  9:01 PM  Result Value Ref Range  Status   Specimen Description URINE, RANDOM  Final   Special Requests NONE  Final   Culture MULTIPLE SPECIES PRESENT, SUGGEST RECOLLECTION  Final   Report Status 09/06/2015 FINAL  Final  Blood Culture (routine x 2)     Status: None   Collection Time: 09/04/15  9:41 PM  Result Value Ref Range Status   Specimen Description BLOOD RIGHT ARM  Final   Special Requests BOTTLES DRAWN AEROBIC AND ANAEROBIC  Final   Culture NO GROWTH 5 DAYS  Final   Report Status 09/09/2015 FINAL  Final  Blood Culture (routine x 2)     Status: None   Collection Time: 09/04/15  9:41 PM  Result Value Ref Range Status   Specimen Description BLOOD LEFT ARM  Final   Special Requests BOTTLES DRAWN AEROBIC AND ANAEROBIC  Final   Culture NO GROWTH 5 DAYS  Final   Report Status 09/09/2015 FINAL  Final  Urine culture     Status: None   Collection Time: 09/06/15  4:58 PM  Result Value Ref Range Status   Specimen Description URINE, CATHETERIZED  Final   Special Requests ertapenem and vanco Normal  Final   Culture   Final    >=100,000 COLONIES/mL VANCOMYCIN RESISTANT ENTEROCOCCUS ENTEROCOCCUS FAECIUM VRE HAVE INTRINSIC RESISTANCE TO MOST COMMONLY USED ANTIBIOTICS AND THE ABILITY TO  ACQUIRE RESISTANCE TO MOST AVAILABLE ANTIBIOTICS. WITH MIXED BACTERIAL ORGANISMS    Report Status 09/10/2015 FINAL  Final   Organism ID, Bacteria VANCOMYCIN RESISTANT ENTEROCOCCUS  Final      Susceptibility   Vancomycin resistant enterococcus - MIC*    AMPICILLIN >=32 RESISTANT Resistant     LEVOFLOXACIN >=8 RESISTANT Resistant     NITROFURANTOIN 128 RESISTANT Resistant     VANCOMYCIN >=32 RESISTANT Resistant     LINEZOLID 2 SENSITIVE Sensitive     * >=100,000 COLONIES/mL VANCOMYCIN RESISTANT ENTEROCOCCUS  C difficile quick scan w PCR reflex     Status: Abnormal   Collection Time: 09/09/15  4:14 PM  Result Value Ref Range Status   C Diff antigen POSITIVE (A) NEGATIVE Final   C Diff toxin NEGATIVE NEGATIVE Final   C Diff  interpretation   Final    Positive for toxigenic C. difficile, active toxin production not detected. Patient has toxigenic C. difficile organisms present in the bowel, but toxin was not detected. The patient may be a carrier or the level of toxin in the sample was below the limit  of detection. This information should be used in conjunction with the patient's clinical history when deciding on possible therapy.     Comment: CRITICAL RESULT CALLED TO, READ BACK BY AND VERIFIED WITH: Burnett Corrente ON 09/09/15 AT 2019 BY TLB   Clostridium Difficile by PCR     Status: Abnormal   Collection Time: 09/09/15  4:14 PM  Result Value Ref Range Status   Toxigenic C Difficile by pcr POSITIVE (A) NEGATIVE Final    Comment: CRITICAL RESULT CALLED TO, READ BACK BY AND VERIFIED WITH: Burnett Corrente ON 09/09/15 AT 2019 BY TLB   MRSA PCR Screening     Status: None   Collection Time: 09/11/15  6:52 AM  Result Value Ref Range Status   MRSA by PCR NEGATIVE NEGATIVE Final    Comment:        The GeneXpert MRSA Assay (FDA approved for NASAL specimens only), is one component of a comprehensive MRSA colonization surveillance program. It is not intended to diagnose MRSA infection nor to guide or monitor treatment for MRSA infections.   Culture, bal-quantitative     Status: None   Collection Time: 09/11/15  2:21 PM  Result Value Ref Range Status   Specimen Description BRONCHIAL ALVEOLAR LAVAGE  Final   Special Requests Normal BRONCHIAL BRUSHING  Final   Gram Stain   Final    FEW WBC SEEN RARE SQUAMOUS EPITHELIAL CELLS PRESENT NO ORGANISMS SEEN    Culture Consistent with normal respiratory flora.  Final   Report Status 09/13/2015 FINAL  Final  Fungus Culture with Smear     Status: None (Preliminary result)   Collection Time: 09/11/15  2:21 PM  Result Value Ref Range Status   Specimen Description BRONCHIAL BRUSHING  Final   Special Requests Normal  Final   Culture YEAST IDENTIFICATION TO FOLLOW    Final   Report Status PENDING  Incomplete    Medications:  Scheduled:  . antiseptic oral rinse  7 mL Mouth Rinse QID  . chlorhexidine gluconate  15 mL Mouth Rinse BID  . enoxaparin (LOVENOX) injection  40 mg Subcutaneous Q24H  . famotidine (PEPCID) IV  20 mg Intravenous Q12H  . feeding supplement (VITAL HIGH PROTEIN)  1,000 mL Per Tube Q24H  . free water  150 mL Per Tube 4 times per day  . levETIRAcetam  500 mg Intravenous Q12H  . linezolid (  ZYVOX) IV  600 mg Intravenous Q12H  . midazolam  4 mg Intravenous Once  . phenylephrine  1 suppository Rectal BID  . piperacillin-tazobactam (ZOSYN)  IV  3.375 g Intravenous 3 times per day  . polyethylene glycol  17 g Oral BID  . potassium chloride  40 mEq Per Tube Once  . sennosides  5 mL Oral BID  . sodium chloride  1,000 mL Intravenous Once  . tiZANidine  2 mg Oral QHS  . vancomycin  125 mg Oral 4 times per day   Infusions:  . dexmedetomidine 0.8 mcg/kg/hr (09/16/15 0400)  . dextrose 5 % and 0.45% NaCl 50 mL/hr at 09/15/15 1225  . norepinephrine (LEVOPHED) Adult infusion Stopped (09/13/15 1200)    Assessment: Pharmacy consulted for electrolyte management for 55 yo female ICU patient requiring mechanical ventilation.     Plan:  K 3.5, Mg 1.9, Phos 3.4. No further supplementation ordered by pharmacy at this time. Will recheck with AM labs.    Carola Frost, Pharm.D., BCPS Clinical Pharmacist 09/16/2015,5:38 AM

## 2015-09-16 NOTE — Progress Notes (Signed)
Pt agitated in pain, abdomen to lower extremity distended. Family at bedside requesting answers to abdominal distention. Pt medicated with fentanyl for pain at this time. Dr. Sheryle Hail in the room to discuss with family.

## 2015-09-16 NOTE — Progress Notes (Signed)
eLink Physician-Brief Progress Note Patient Name: Storey Stangeland DOB: 17-Oct-1960 MRN: [id number]   Date of Service  09/16/2015  HPI/Events of Note  Hypotensive.  eICU Interventions  Will give 1 liter NS fluid bolus.     Intervention Category Major Interventions: Other:  Brynja Marker 09/16/2015, 4:56 AM

## 2015-09-16 NOTE — Progress Notes (Signed)
Nutrition Follow-up  DOCUMENTATION CODES:   Severe malnutrition in context of chronic illness  INTERVENTION:   EN: TF as tolerated; may benefit from restarting at back at low rate. Continue to assess   NUTRITION DIAGNOSIS:   Inadequate oral intake related to acute illness as evidenced by NPO status.  GOAL:   Patient will meet greater than or equal to 90% of their needs  MONITOR:    (Energy intake)  REASON FOR ASSESSMENT:   Consult Enteral/tube feeding initiation and management  ASSESSMENT:     Pt remains on vent, per MD notes plan for possible extubation tomorrow; with plans that if pt fails trial of extubation, she will be re-intubated with subsequent trach/PEG placement prior to discharge  EN: TF on hold due to abdominal distention  Digestive System: abdomen distended, abdominal xray consistent with adynamic ileus  Skin:   (stage I pressure ulcer)   Recent Labs Lab 09/14/15 0500 09/15/15 0502 09/16/15 0443  NA 139 138 136  K 3.4* 3.1* 3.5  CL 111 110 109  CO2 BUN CREATININE 0.32* 0.31* 0.39*  CALCIUM 7.2* 7.2* 7.4*  MG 1.6* 2.2 1.9  PHOS 2.7 2.3* 3.4  GLUCOSE 129* 115* 112*    Glucose Profile:  Recent Labs  09/16/15 0343 09/16/15 0744 09/16/15 1140  GLUCAP 118* 101* 92   Meds: D5-1/2 NS at 50 ml/hr, precedex  Height:   Ht Readings from Last 1 Encounters:  09/11/15  (1.575 m)    Weight:   Wt Readings from Last 1 Encounters:  09/16/15 122 lb 5.7 oz (55.5 kg)    Filed Weights   09/14/15 0424 09/15/15 0716 09/16/15 0532  Weight: 117 lb 4.6 oz (53.2 kg) 115 lb 11.9 oz (52.5 kg) 122 lb 5.7 oz (55.5 kg)    BMI:  Body mass index is 22.37 kg/(m^2).  Estimated Nutritional Needs:   Kcal:  1123kcals, (BEE: 1011kcals, Ve: 6, Tmax: 37) using [employer] Equation and current weight of 45.8kg  Protein:  69-92g protein (1.5-2.0g/kg)  Fluid:  (25-12ml//kg) 900-1051ml/d  EDUCATION NEEDS:   No education needs  identified at this time  HIGH Care Level  [facility] MS, RD, LDN [phone] Pager  [phone] Weekend/On-Call Pager

## 2015-09-16 NOTE — Progress Notes (Signed)
Pt. Rested on and off during shift. Pt hypotensive after second dose of 100 Fentanyl via IV push. MAP returned to WNL shortly after Fentanyl given. Periods of anxiety and agitation due to ventilator and mitts. Doesn't want the mitts on but when they are off constantly tries to pull on the vent tubing. She is coherent and will nod in agreement that if mitts are removed she will leave tubing alone however she does not. Nods yes when asked if she is feeling anxious and has anxiety issues at baseline. Increased Precedex to .08 however it causes hypotension. MAP mid 50's, notifed E-link who ordered 1000cc bolus. Decreased Precedex to .07. Map currently 95, bolus not given.

## 2015-09-16 NOTE — Progress Notes (Signed)
[facility]INIC INFECTIOUS DISEASE PROGRESS NOTE Date of Admission:  09/04/2015     ID: Jennifer [name] is a 55 y.o. female with recurrent UTI, C diff, resp failure Active Problems:   UTI (lower urinary tract infection)   Pressure ulcer   Protein-calorie malnutrition, severe   Hemorrhoid prolapse   Hypoxia   Sepsis (HCC)   Acute respiratory failure (HCC)   Breathing difficult   Respiratory failure requiring intubation (HCC)  Subjective: Remains intubated but on only 30% FiO2. Has ileus on Xray abd.   ROS  Unable to obtain  Medications:  Antibiotics Given (last 72 hours)    Date/Time Action Medication Dose Rate   09/14/15 0000 Given   vancomycin (VANCOCIN) 50 mg/mL oral solution 125 mg 125 mg    09/14/15 0013 Given   piperacillin-tazobactam (ZOSYN) IVPB 3.375 g 3.375 g 12.5 mL/hr   09/14/15 0014 Given   linezolid (ZYVOX) IVPB 600 mg 600 mg 300 mL/hr   09/14/15 0504 Given   vancomycin (VANCOCIN) 50 mg/mL oral solution 125 mg 125 mg    09/14/15 0504 Given   piperacillin-tazobactam (ZOSYN) IVPB 3.375 g 3.375 g 12.5 mL/hr   09/14/15 1025 Given   linezolid (ZYVOX) IVPB 600 mg 600 mg 300 mL/hr   09/14/15 1243 Given   vancomycin (VANCOCIN) 50 mg/mL oral solution 125 mg 125 mg    09/14/15 1428 Given   piperacillin-tazobactam (ZOSYN) IVPB 3.375 g 3.375 g 12.5 mL/hr   09/14/15 1820 Given   vancomycin (VANCOCIN) 50 mg/mL oral solution 125 mg 125 mg    09/14/15 2217 Given   linezolid (ZYVOX) IVPB 600 mg 600 mg 300 mL/hr   09/14/15 2217 Given   piperacillin-tazobactam (ZOSYN) IVPB 3.375 g 3.375 g 12.5 mL/hr   09/15/15 0000 Given   vancomycin (VANCOCIN) 50 mg/mL oral solution 125 mg 125 mg    09/15/15 0607 Given   vancomycin (VANCOCIN) 50 mg/mL oral solution 125 mg 125 mg    09/15/15 0607 Given   piperacillin-tazobactam (ZOSYN) IVPB 3.375 g 3.375 g 12.5 mL/hr   09/15/15 1050 Given   linezolid (ZYVOX) IVPB 600 mg 600 mg 300 mL/hr   09/15/15 1226 Given   vancomycin (VANCOCIN) 50  mg/mL oral solution 125 mg 125 mg    09/15/15 1416 Given   piperacillin-tazobactam (ZOSYN) IVPB 3.375 g 3.375 g 12.5 mL/hr   09/15/15 1733 Given   vancomycin (VANCOCIN) 50 mg/mL oral solution 125 mg 125 mg    09/15/15 2200 Given   linezolid (ZYVOX) IVPB 600 mg 600 mg 300 mL/hr   09/15/15 2222 Given   piperacillin-tazobactam (ZOSYN) IVPB 3.375 g 3.375 g 12.5 mL/hr   09/16/15 0142 Given   vancomycin (VANCOCIN) 50 mg/mL oral solution 125 mg 125 mg    09/16/15 0606 Given   vancomycin (VANCOCIN) 50 mg/mL oral solution 125 mg 125 mg    09/16/15 0606 Given   piperacillin-tazobactam (ZOSYN) IVPB 3.375 g 3.375 g 12.5 mL/hr   09/16/15 0959 Given   linezolid (ZYVOX) IVPB 600 mg 600 mg 300 mL/hr   09/16/15 1142 Given   vancomycin (VANCOCIN) 50 mg/mL oral solution 125 mg 125 mg      . antiseptic oral rinse  7 mL Mouth Rinse QID  . chlorhexidine gluconate  15 mL Mouth Rinse BID  . Dextromethorphan-Quinidine  1 capsule Oral BID  . enoxaparin (LOVENOX) injection  40 mg Subcutaneous Q24H  . famotidine (PEPCID) IV  20 mg Intravenous Q12H  . feeding supplement (VITAL HIGH PROTEIN)  1,000 mL Per Tube  Q24H  . free water  150 mL Per Tube 4 times per day  . levETIRAcetam  500 mg Intravenous Q12H  . midazolam  4 mg Intravenous Once  . phenylephrine  1 suppository Rectal BID  . polyethylene glycol  17 g Oral BID  . potassium chloride  40 mEq Per Tube Once  . sennosides  5 mL Oral BID  . sodium chloride  1,000 mL Intravenous Once  . tiZANidine  2 mg Oral QHS  . vancomycin  125 mg Oral 4 times per day    Objective: Vital signs in last 24 hours: Temp:  [97.9 F (36.6 C)-98.5 F (36.9 C)] 98.5 F (36.9 C) (02/20 1100) Pulse Rate:  [68-150] 94 (02/20 1300) Resp:  [16-35] 26 (02/20 1300) BP: (64-155)/(49-131) 132/58 mmHg (02/20 1300) SpO2:  [94 %-100 %] 100 % (02/20 1300) FiO2 (%):  [30 %] 30 % (02/20 0850) Weight:  [55.5 kg (122 lb 5.7 oz)] 55.5 kg (122 lb 5.7 oz) (02/20 0532) GENERAL: Intubated  but awake EYES: Pupils equal, round, reactive to light and accommodation. No scleral icterus.  HEENT: ETT NECK: Supple, no jugular venous distention. No thyroid enlargement, no tenderness.  LUNGS:Rhonchi   CARDIOVASCULAR: S1, S2 normal.  ABDOMEN: Soft, nontender, somewhat distended . Bowel sounds quiet . No organomegaly or mass.  EXTREMITIES: 2+ edema to thigh NEUROLOGIC: Slumped to the right side. Does not follow commands. PSYCHIATRIC: The patient is sedated  SKIN: No obvious rash, lesion, or ulcer. Foley in place with concentrated urine   Lab Results  Recent Labs  09/15/15 0502 09/16/15 0443  WBC 15.8* 13.4*  HGB 8.0* 7.9*  HCT 24.1* 24.3*  NA 138 136  K 3.1* 3.5  CL 110 109  CO2 22 24  BUN 15 13  CREATININE 0.31* 0.39*    Microbiology: Results for orders placed or performed during the hospital encounter of 09/04/15  Urine culture     Status: None   Collection Time: 09/04/15  9:01 PM  Result Value Ref Range Status   Specimen Description URINE, RANDOM  Final   Special Requests NONE  Final   Culture MULTIPLE SPECIES PRESENT, SUGGEST RECOLLECTION  Final   Report Status 09/06/2015 FINAL  Final  Blood Culture (routine x 2)     Status: None   Collection Time: 09/04/15  9:41 PM  Result Value Ref Range Status   Specimen Description BLOOD RIGHT ARM  Final   Special Requests BOTTLES DRAWN AEROBIC AND ANAEROBIC  Final   Culture NO GROWTH 5 DAYS  Final   Report Status 09/09/2015 FINAL  Final  Blood Culture (routine x 2)     Status: None   Collection Time: 09/04/15  9:41 PM  Result Value Ref Range Status   Specimen Description BLOOD LEFT ARM  Final   Special Requests BOTTLES DRAWN AEROBIC AND ANAEROBIC  Final   Culture NO GROWTH 5 DAYS  Final   Report Status 09/09/2015 FINAL  Final  Urine culture     Status: None   Collection Time: 09/06/15  4:58 PM  Result Value Ref Range Status   Specimen Description URINE, CATHETERIZED  Final   Special Requests  ertapenem and vanco Normal  Final   Culture   Final    >=100,000 COLONIES/mL VANCOMYCIN RESISTANT ENTEROCOCCUS ENTEROCOCCUS FAECIUM VRE HAVE INTRINSIC RESISTANCE TO MOST COMMONLY USED ANTIBIOTICS AND THE ABILITY TO ACQUIRE RESISTANCE TO MOST AVAILABLE ANTIBIOTICS. WITH MIXED BACTERIAL ORGANISMS    Report Status 09/10/2015 FINAL  Final  Organism ID, Bacteria VANCOMYCIN RESISTANT ENTEROCOCCUS  Final      Susceptibility   Vancomycin resistant enterococcus - MIC*    AMPICILLIN >=32 RESISTANT Resistant     LEVOFLOXACIN >=8 RESISTANT Resistant     NITROFURANTOIN 128 RESISTANT Resistant     VANCOMYCIN >=32 RESISTANT Resistant     LINEZOLID 2 SENSITIVE Sensitive     * >=100,000 COLONIES/mL VANCOMYCIN RESISTANT ENTEROCOCCUS  C difficile quick scan w PCR reflex     Status: Abnormal   Collection Time: 09/09/15  4:14 PM  Result Value Ref Range Status   C Diff antigen POSITIVE (A) NEGATIVE Final   C Diff toxin NEGATIVE NEGATIVE Final   C Diff interpretation   Final    Positive for toxigenic C. difficile, active toxin production not detected. Patient has toxigenic C. difficile organisms present in the bowel, but toxin was not detected. The patient may be a carrier or the level of toxin in the sample was below the limit  of detection. This information should be used in conjunction with the patient's clinical history when deciding on possible therapy.     Comment: CRITICAL RESULT CALLED TO, READ BACK BY AND VERIFIED WITH: Burnett Corrente ON 09/09/15 AT 2019 BY TLB   Clostridium Difficile by PCR     Status: Abnormal   Collection Time: 09/09/15  4:14 PM  Result Value Ref Range Status   Toxigenic C Difficile by pcr POSITIVE (A) NEGATIVE Final    Comment: CRITICAL RESULT CALLED TO, READ BACK BY AND VERIFIED WITH: Burnett Corrente ON 09/09/15 AT 2019 BY TLB   MRSA PCR Screening     Status: None   Collection Time: 09/11/15  6:52 AM  Result Value Ref Range Status   MRSA by PCR NEGATIVE NEGATIVE Final     Comment:        The GeneXpert MRSA Assay (FDA approved for NASAL specimens only), is one component of a comprehensive MRSA colonization surveillance program. It is not intended to diagnose MRSA infection nor to guide or monitor treatment for MRSA infections.   Culture, bal-quantitative     Status: None   Collection Time: 09/11/15  2:21 PM  Result Value Ref Range Status   Specimen Description BRONCHIAL ALVEOLAR LAVAGE  Final   Special Requests Normal BRONCHIAL BRUSHING  Final   Gram Stain   Final    FEW WBC SEEN RARE SQUAMOUS EPITHELIAL CELLS PRESENT NO ORGANISMS SEEN    Culture Consistent with normal respiratory flora.  Final   Report Status 09/13/2015 FINAL  Final  Fungus Culture with Smear     Status: None (Preliminary result)   Collection Time: 09/11/15  2:21 PM  Result Value Ref Range Status   Specimen Description BRONCHIAL BRUSHING  Final   Special Requests Normal  Final   Culture CANDIDA GLABRATA  Final   Report Status PENDING  Incomplete    Studies/Results: Dg Abd 1 View  09/16/2015  CLINICAL DATA:  Respiratory failure.  Ileus. EXAM: ABDOMEN - 1 VIEW COMPARISON:  09/13/2015. FINDINGS: NG tube in stable position. Persistent dilated loops of small bowel noted. Dilated loops of colon noted on today's exam. These findings are again consistent with adynamic ileus. Continued follow-up exam suggested demonstrate resolution to exclude bowel obstruction. No pneumatosis or free air noted. Retained barium noted. No acute bony abnormality. IMPRESSION: 1. NG tube in stable position. 2. Persistent dilated loops of small bowel noted. Dilated loops of colon are noted on today's exam. Findings most consistent with adynamic ileus. Continued  follow-up exams suggested to exclude bowel obstruction. Electronically Signed   By: Maisie Fus  Register   On: 09/16/2015 07:19   Dg Chest Port 1 View  09/16/2015  CLINICAL DATA:  Acute respiratory failure. EXAM: PORTABLE CHEST 1 VIEW COMPARISON:   09/15/2015. FINDINGS: The heart remains normal in size. No significant change in bilateral patchy and linear densities. Poor inspiration. Endotracheal tube in satisfactory position. Nasogastric tube tip in the in the region of the gastric pylorus. The left jugular catheter tip in the superior vena cava. Unremarkable bones. IMPRESSION: Stable bilateral atelectasis without definite pneumonia. Electronically Signed   By: Beckie Salts M.D.   On: 09/16/2015 07:20   Dg Chest Port 1 View  09/15/2015  CLINICAL DATA:  Acute respiratory failure.  ALS. EXAM: PORTABLE CHEST 1 VIEW COMPARISON:  Chest radiograph from one day prior. FINDINGS: Endotracheal tube tip is 2.1 cm above the carina. Enteric tube terminates in the proximal stomach. Left internal jugular central venous catheter terminates in the lower third of the superior vena cava. Stable cardiomediastinal silhouette with normal heart size. No pneumothorax. No pleural effusion. Low lung volumes with linear and patchy opacities in the mid to lower lungs bilaterally, stable. IMPRESSION: 1. Support hardware as described. 2. No pneumothorax. 3. Stable low lung volumes with curvilinear and patchy opacities in the mid to lower lungs bilaterally, unchanged, favor atelectasis and/or pneumonia. Electronically Signed   By: Delbert Phenix M.D.   On: 09/15/2015 09:09    Assessment/Plan: Jennifer [name] is a 55 y.o. 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 Prior cxs with VRE and Klebsiella - Admit cx mixed but fu again with VRE Foley changed this admission - she was due to have suprapubic cath placed but declined. Has not been on suppressive abx, gets foley changed q 2 weeks at home.  She had CT scan in June to look for other reasons for recurrent UTI . Flu negative Her fever spiked again so changed to dapto and meropenem and improving some.  Repeat UCX with > 100 K VRE.  C diff + Sputum cx neg. Has ileus now  Recommendations Is  s/p 11 days treatment with dapto and linezolid for VRE in urine. Dced today  C diff test + - has ileus as well- add IV metronidazole, cont  oral vanco and start PR vanco if not passing BMs Due to ileus  Thank you very much for the consult. Will follow with you.  Candy Leverett   09/16/2015, 2:43 PM

## 2015-09-16 NOTE — Progress Notes (Addendum)
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 Developed labored breathing requiring BiPAP with subsequent intubation, had a bronchoscopy with BAL, currently being monitored in ICU.  SUBJECTIVE:  Hypotensive and anxious overnight. Precedex gtt increased but then she became hypotensive. IL bolus of NS given. BP improved with decreased sedation and fluids. Her MIP was -21 x 3.  Awake and nods in disapproval to pain  VITAL SIGNS: Temp:  [97.9 F (36.6 C)-98 F (36.7 C)] 98 F (36.7 C) (02/20 0800) Pulse Rate:  [68-150] 82 (02/20 1000) Resp:  [16-35] 18 (02/20 1000) BP: (64-155)/(49-131) 155/117 mmHg (02/20 1000) SpO2:  [97 %-100 %] 100 % (02/20 1000) FiO2 (%):  [30 %] 30 % (02/20 0850) Weight:  [122 lb 5.7 oz (55.5 kg)] 122 lb 5.7 oz (55.5 kg) (02/20 0532) HEMODYNAMICS: CVP:  [8 mmHg-60 mmHg] 12 mmHg VENTILATOR SETTINGS: Vent Mode:  [-] Spontaneous FiO2 (%):  [30 %] 30 % Set Rate:  [15 bmp] 15 bmp Vt Set:  [400 mL] 400 mL PEEP:  [5 cmH20] 5 cmH20 Pressure Support:  [5 cmH20] 5 cmH20 Plateau Pressure:  [21 cmH20] 21 cmH20 INTAKE / OUTPUT:  Intake/Output Summary (Last 24 hours) at 09/16/15 1254 Last data filed at 09/16/15 1000  Gross per 24 hour  Intake 3381.84 ml  Output    800 ml  Net 2581.84 ml    Review of Systems  Unable to perform ROS: intubated    Physical Exam  Constitutional: No distress.  Chronically ill looking  HENT:  Head: Normocephalic and atraumatic.  Right Ear: External ear normal.  Left Ear: External ear normal.  ETT  and OGT  Eyes: Conjunctivae and EOM are normal. Pupils are equal, round, and reactive to light.  Neck: Normal range of motion. Neck supple. No thyromegaly present.  Cardiovascular:  Normal rate, regular rhythm, normal heart sounds and intact distal pulses.   No murmur heard. Pulmonary/Chest: No respiratory distress. She has no wheezes. She has no rales.  Shallow breaths on PSV, Bilateral airflow with  diminished breathsounds at the bases  Abdominal: Soft. Bowel sounds are normal. She exhibits no distension.  Musculoskeletal: She exhibits no edema or tenderness.  Contractures in bilateral lower extremities  Neurological: She is alert.  Awake on the vent, following commands, moves upper extremities but responses are sluggist   Skin: Skin is warm and dry.  Nursing note and vitals reviewed.    LABS:  CBC  Recent Labs Lab 09/13/15 0505 09/15/15 0502 09/16/15 0443  WBC 17.1* 15.8* 13.4*  HGB 9.4* 8.0* 7.9*  HCT 28.8* 24.1* 24.3*  PLT 440 366 365   Coag's No results for input(s): APTT, INR in the last 168 hours. BMET  Recent Labs Lab 09/14/15 0500 09/15/15 0502 09/16/15 0443  NA 139 138 136  K 3.4* 3.1* 3.5  CL 111 110 109  CO2 25 22 24   BUN 14 15 13   CREATININE 0.32* 0.31* 0.39*  GLUCOSE 129* 115* 112*   Electrolytes  Recent Labs Lab 09/14/15 0500 09/15/15 0502 09/16/15 0443  CALCIUM 7.2* 7.2* 7.4*  MG 1.6* 2.2 1.9  PHOS 2.7 2.3* 3.4   Sepsis Markers No results for input(s): LATICACIDVEN, PROCALCITON, O2SATVEN in the last 168 hours. ABG  Recent Labs Lab 09/11/15 1759 09/14/15 0938 09/16/15 0500  PHART 7.36 7.48* 7.45  PCO2ART 43 27* 29*  PO2ART 67* 112* 108   Liver Enzymes  Recent Labs Lab 09/12/15 0343  ALBUMIN 1.7*   Cardiac Enzymes No results for input(s): TROPONINI, PROBNP in the last 168 hours. Glucose  Recent Labs Lab 09/15/15 1631 09/15/15 1950 09/15/15 2357 09/16/15 0343 09/16/15 0744 09/16/15 1140  GLUCAP 89 83 81 118* 101* 92    Imaging Dg Abd 1 View  09/16/2015  CLINICAL DATA:  Respiratory failure.  Ileus. EXAM: ABDOMEN - 1 VIEW COMPARISON:  09/13/2015. FINDINGS: NG tube in stable position.  Persistent dilated loops of small bowel noted. Dilated loops of colon noted on today's exam. These findings are again consistent with adynamic ileus. Continued follow-up exam suggested demonstrate resolution to exclude bowel obstruction. No pneumatosis or free air noted. Retained barium noted. No acute bony abnormality. IMPRESSION: 1. NG tube in stable position. 2. Persistent dilated loops of small bowel noted. Dilated loops of colon are noted on today's exam. Findings most consistent with adynamic ileus. Continued follow-up exams suggested to exclude bowel obstruction. Electronically Signed   By: Marcello Moores  Register   On: 09/16/2015 07:19   Dg Chest Port 1 View  09/16/2015  CLINICAL DATA:  Acute respiratory failure. EXAM: PORTABLE CHEST 1 VIEW COMPARISON:  09/15/2015. FINDINGS: The heart remains normal in size. No significant change in bilateral patchy and linear densities. Poor inspiration. Endotracheal tube in satisfactory position. Nasogastric tube tip in the in the region of the gastric pylorus. The left jugular catheter tip in the superior vena cava. Unremarkable bones. IMPRESSION: Stable bilateral atelectasis without definite pneumonia. Electronically Signed   By: Claudie Revering M.D.   On: 09/16/2015 07:20    Cultures: BCx2  UC 2/10-VRE Sputum MRSA PCR negative C. Difficile-antigen positive Influenza panel negative 02/15:BAL>NTD  STUDIES:   None   SIGNIFICANT EVENTS: 2/15>> intubated, bronchoscopy BAL-Cultures negative todate  Antibiotics: Vancomycin 02/14>>-Oral linezolid 02/15>> Zosyn 02/15>>   Lines [facility] CVL 2/15>> Foley  ASSESSMENT / PLAN: 55 year old female past medical history of ALS, chronic Foley catheter, recurrent UTI,initially admitted on 2/8 weakness, hallucination, secondary to possible UTI/sepsis, hospitalization complicated with C. Difficile, respiratory failure requiring intubation, now with slow respiratory improvement.   A: PULMONARY OETT 8.0 Respiratory  failure-ventilator dependent-difficult wean ALS stage 4 Right lower lobe atelectasis/pneumonia P:  -Continue mechanical ventilation, wean as tolerated, keep O2 saturations greater than 80%.  -Plan is to extubate tomorrow and tried on BiPAP and if she fails, she will be re-intubated and then trached/pegged.  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 Difficile antigen Sepsis 2/2 UTI P:  - Continue vancomycin, flagyl and d/c zosyn and zyvox -Culture if febrile  A: ENDOCRINE Elevated blood glucose without diabetes P -ICU hypo/ hyperglycemic protocol  A: NEUROLOGIC ALS Seizure disorder Chronic anxiety P:  RASS goal:0 to -1 -Continue with ALS medications, bed bound at baseline, able to sit without assistance, able to verbally communicate at baseline. -Resume nuedexta  at home dose. Husband is able to bring her home supply.   -Continue with antidepressants and muscle relaxants -Continue her Keppra -Low dose precedex for mild agitation  -Continue  home dose of valium -Avoid over sedation  Family update/Disposition: Dr. Leonidas Romberg and myself met with patient and her patient's husband at bedside. Subsequent need for trach reviewed. Plan is to keep her on PSV as tolerated and full vent support tonight with subsequent extubation tomorrow. If she fails  the extubation, she will be re-intubated and then pegged an trached before discharge. All questions answered and support provided.   Best Practice: Code Status:  Full. Diet: NPO GI prophylaxis:  Pepcid VTE prophylaxis:  SCD's / Lovenox.  CCM time 38 minutes  Magdalene S. Tukov ANP-BC Pulmonary and Critical Care Medicine [facility] Pager [phone]  PCCM ATTENDING ATTESTATION:  I have evaluated patient with ANP Patria Mane, reviewed database in its entirety and discussed care plan in detail. In addition, this patient was discussed on multidisciplinary rounds.   Important exam findings: VDRF CXR with low volumes, atelectasis Tolerated PS 5 cm H2O marginally this AM but with very limited reserve Clear chest anteriorly Tachy, reg Distended, tympanitic abdomen with diminished bowel sounds Agitation/discomfort  Major problems addressed by PCCM team: Advanced ALS UTI with VRE C diff Ileus VDRF  Her respiratory status is likely marginal on her best day. Presently, I am concerned that the abd distention prohibits extubation. We have started erythromycin for this. Would avoid metoclopramide due to spasticity.   PLAN/REC: See plan above I discussed goals of care with pt and her husband. We discussed whether we would re-intubate her if she fails extubation. I clarified that re-intubation would necessarily mean that she would require trach tube placement which would also include G tube placement and that she would not likely eat by mouth thereafter. In contrast, I offered that this is a choice that can reasonably be made depending on her values and priorities. Lastly, I emphasized that, if she chose to pursue trach tube placement, she would have the right to choose to discontinuation vent support in the future if she found her existence to be unsatisfactory. The pt was too agitated to express her wishes in a way that I could confidently understand. Her husband indicates that they have  considered these matters in the past and he will ponder them further before extubation.   CCM time: 45 mins The above time includes time spent in consultation with patient and/or family members and reviewing care plan on multidisciplinary rounds  Merton Border, MD PCCM service Mobile [phone] Pager [phone]

## 2015-09-17 ENCOUNTER — Inpatient Hospital Stay: Payer: BLUE CROSS/BLUE SHIELD

## 2015-09-17 LAB — BLOOD GAS, ARTERIAL
Acid-base deficit: 3.2 mmol/L — ABNORMAL HIGH (ref 0.0–2.0)
Allens test (pass/fail): POSITIVE — AB
BICARBONATE: 19.7 meq/L — AB (ref 21.0–28.0)
FIO2: 0.3
MODE: POSITIVE
O2 Saturation: 98 %
PCO2 ART: 29 mmHg — AB (ref 32.0–48.0)
Patient temperature: 37
Pressure support: 5 cmH2O
pH, Arterial: 7.44 (ref 7.350–7.450)
pO2, Arterial: 101 mmHg (ref 83.0–108.0)

## 2015-09-17 LAB — BASIC METABOLIC PANEL
Anion gap: 7 (ref 5–15)
BUN: 8 mg/dL (ref 6–20)
CALCIUM: 7.7 mg/dL — AB (ref 8.9–10.3)
CO2: 21 mmol/L — AB (ref 22–32)
CREATININE: 0.39 mg/dL — AB (ref 0.44–1.00)
Chloride: 111 mmol/L (ref 101–111)
GFR calc non Af Amer: >60 mL/min (ref >60)
GLUCOSE: 71 mg/dL (ref 65–99)
Potassium: 3.4 mmol/L — ABNORMAL LOW (ref 3.5–5.1)
Sodium: 139 mmol/L (ref 135–145)

## 2015-09-17 LAB — GLUCOSE, CAPILLARY
GLUCOSE-CAPILLARY: 75 mg/dL (ref 65–99)
GLUCOSE-CAPILLARY: 71 mg/dL (ref 65–99)
GLUCOSE-CAPILLARY: 66 mg/dL (ref 65–99)
Glucose-Capillary: 80 mg/dL (ref 65–99)
Glucose-Capillary: 56 mg/dL — ABNORMAL LOW (ref 65–99)
Glucose-Capillary: 79 mg/dL (ref 65–99)
Glucose-Capillary: 77 mg/dL (ref 65–99)

## 2015-09-17 LAB — PHOSPHORUS: PHOSPHORUS: 3.7 mg/dL (ref 2.5–4.6)

## 2015-09-17 LAB — MAGNESIUM: Magnesium: 1.8 mg/dL (ref 1.7–2.4)

## 2015-09-17 MED ORDER — DEXTROMETHORPHAN-QUINIDINE 20-10 MG PO CAPS
1.0000 | ORAL_CAPSULE | Freq: Two times a day (BID) | ORAL | Status: DC
  Administered 2015-09-17 – 2015-09-24 (×14): 1 via ORAL
  Filled 2015-09-17 (×2): qty 1

## 2015-09-17 MED ORDER — SENNOSIDES 8.8 MG/5ML PO SYRP
10.0000 mL | ORAL_SOLUTION | Freq: Two times a day (BID) | ORAL | Status: DC
  Administered 2015-09-17 – 2015-09-19 (×5): 10 mL via ORAL
  Filled 2015-09-17 (×7): qty 10

## 2015-09-17 MED ORDER — DEXTROSE 50 % IV SOLN
12.5000 g | Freq: Once | INTRAVENOUS | Status: AC
  Administered 2015-09-17: 12.5 g via INTRAVENOUS
  Filled 2015-09-17: qty 50

## 2015-09-17 MED ORDER — DEXTROSE-NACL 5-0.9 % IV SOLN
INTRAVENOUS | Status: DC
  Administered 2015-09-17 – 2015-09-20 (×4): via INTRAVENOUS

## 2015-09-17 MED ORDER — POTASSIUM CHLORIDE 10 MEQ/100ML IV SOLN
10.0000 meq | INTRAVENOUS | Status: AC
  Administered 2015-09-17 (×2): 10 meq via INTRAVENOUS
  Filled 2015-09-17 (×2): qty 100

## 2015-09-17 MED ORDER — BISACODYL 10 MG RE SUPP
10.0000 mg | Freq: Every day | RECTAL | Status: DC
  Administered 2015-09-18 – 2015-09-19 (×2): 10 mg via RECTAL
  Filled 2015-09-17: qty 1

## 2015-09-17 MED ORDER — DIAZEPAM 5 MG PO TABS
10.0000 mg | ORAL_TABLET | Freq: Once | ORAL | Status: AC
  Administered 2015-09-17: 10 mg via ORAL

## 2015-09-17 NOTE — Consult Note (Signed)
Patient ID: Jennifer [name], female   DOB: Feb 10, 1961, 55 y.o.   MRN: [id number] Reason for consult: Possible ileus HPI Jennifer [name] is a 55 y.o. female currently admitted to the medicine service where Dr. Elisabeth Pigeon has asked for a surgery consultation due to image findings of dilated colon. Patient has ALS and is unable to participate in the history or exam. There is no family at the bedside. Per nursing she is been receiving feeds through the NG tube up until yesterday. Patient was extubated from mechanical ventilation yesterday. She was admitted secondary to VRE sepsis from a urinary source. Also question if she had an aspiration event leading to pulmonary decline during this hospital stay. She's also tested positive for C. difficile.  HPI  Past Medical History  Diagnosis Date  . ALS (amyotrophic lateral sclerosis) (HCC)   . Depression   . GERD (gastroesophageal reflux disease)   . Urinary retention   . Dysphagia, oropharyngeal 11/28/2013  . CN (constipation) 11/28/2013    Past Surgical History  Procedure Laterality Date  . Cesarean section    . Nasal sinus surgery      Family History  Problem Relation Age of Onset  . COPD Mother   . Thyroid disease Mother   . Diverticulosis Mother   . Heart attack Mother   . Gout Father   . Depression Sister   . Thyroid disease Sister   . Cancer Brother     kidney  . Heart attack Maternal Grandmother   . Heart attack Maternal Grandfather   . Heart attack Paternal Grandfather   . Hyperlipidemia Sister   . Evelene Croon Parkinson White syndrome Sister 25  . Depression Brother   . Hypertension Brother     Social History Social History  Substance Use Topics  . Smoking status: Former Games developer  . Smokeless tobacco: Never Used  . Alcohol Use: No    Allergies  Allergen Reactions  . Bee Venom Anaphylaxis  . Hydrocodone Other (See Comments)    Reaction:  Muscle stiffness   . Lipitor [Atorvastatin] Other (See Comments)    Reaction:  Muscle cramps    . Shellfish Allergy Anaphylaxis  . Oxycodone Other (See Comments)    Reaction:  Muscle stiffness   . Prednisone Other (See Comments)    Reaction:  Psychosis   . Vicodin [Hydrocodone-Acetaminophen] Other (See Comments)    Reaction:  Muscle stiffness   . Oxycontin [Oxycodone Hcl] Other (See Comments)    Reaction:  Muscle stiffness   . Peanut-Containing Drug Products Diarrhea    Current Facility-Administered Medications  Medication Dose Route Frequency Provider Last Rate Last Dose  . acetaminophen (TYLENOL) suppository 650 mg  650 mg Rectal Q4H PRN Milagros Loll, MD   650 mg at 09/17/15 2026  . albuterol (PROVENTIL) (2.5 MG/3ML) 0.083% nebulizer solution 2.5 mg  2.5 mg Nebulization Q2H PRN Srikar Sudini, MD      . antiseptic oral rinse solution (CORINZ)  7 mL Mouth Rinse QID Vishal Mungal, MD   7 mL at 09/17/15 0400  . atropine 1 % ophthalmic solution 2 drop  2 drop Sublingual QID PRN Enid Baas, MD      . bisacodyl (DULCOLAX) suppository 10 mg  10 mg Rectal Daily Altamese Dilling, MD   10 mg at 09/17/15 1100  . Dextromethorphan-Quinidine 20-10 MG CAPS 1 capsule  1 capsule Oral BID Magadalene S Tukov, NP      . dextrose 5 %-0.9 % sodium chloride infusion   Intravenous Continuous Altamese Dilling, MD 50  mL/hr at 09/17/15 1900    . diazepam (VALIUM) tablet 10 mg  10 mg Oral Q6H PRN Stephanie Acre, MD   10 mg at 09/17/15 2026  . enoxaparin (LOVENOX) injection 40 mg  40 mg Subcutaneous Q24H Ramonita Lab, MD   40 mg at 09/17/15 2008  . erythromycin ethylsuccinate (EES) 200 MG/5ML suspension 200 mg  200 mg Per Tube 3 times per day Lewie Loron, NP   200 mg at 09/17/15 1429  . famotidine (PEPCID) IVPB 20 mg premix  20 mg Intravenous Q12H Lupita Leash, MD   20 mg at 09/17/15 0856  . ibuprofen (ADVIL,MOTRIN) tablet 400 mg  400 mg Oral Q6H PRN Auburn Bilberry, MD   400 mg at 09/06/15 1904  . levETIRAcetam (KEPPRA) 500 mg in sodium chloride 0.9 % 100 mL IVPB  500 mg Intravenous  Q12H Srikar Sudini, MD   500 mg at 09/17/15 2001  . metroNIDAZOLE (FLAGYL) IVPB 500 mg  500 mg Intravenous Q8H Clydie Braun, MD   500 mg at 09/17/15 1605  . ondansetron (ZOFRAN) tablet 4 mg  4 mg Oral Q6H PRN Milagros Loll, MD       Or  . ondansetron (ZOFRAN) injection 4 mg  4 mg Intravenous Q6H PRN Milagros Loll, MD   4 mg at 09/10/15 1901  . phenylephrine ((USE for PREPARATION-H)) 0.25 % suppository 1 suppository  1 suppository Rectal BID Lattie Haw, MD   1 suppository at 09/16/15 [phone]  . polyethylene glycol (MIRALAX / GLYCOLAX) packet 17 g  17 g Oral BID Shane Crutch, MD   17 g at 09/17/15 0859  . potassium chloride 20 MEQ/15ML (10%) solution 40 mEq  40 mEq Per Tube Once Zigmund Gottron, MD      . sennosides (SENOKOT) 8.8 MG/5ML syrup 10 mL  10 mL Oral BID Altamese Dilling, MD      . sodium chloride 0.9 % bolus 1,000 mL  1,000 mL Intravenous Once Coralyn Helling, MD      . vancomycin (VANCOCIN) 50 mg/mL oral solution 125 mg  125 mg Oral 4 times per day Clydie Braun, MD   125 mg at 09/17/15 1659  . vancomycin (VANCOCIN) 500 mg in sodium chloride irrigation 0.9 % 100 mL ENEMA  500 mg Rectal 4 times per day Clydie Braun, MD   500 mg at 09/17/15 1429     Review of Systems A review of systems is impossible to complete due to patient's mental status   Physical Exam Blood pressure 124/73, pulse 95, temperature 99.1 F (37.3 C), temperature source Axillary, resp. rate 31, height  (1.575 m), weight 57.4 kg (126 lb 8.7 oz), SpO2 97 %. CONSTITUTIONAL: No acute distress. EYES: Pupils are equal, round, and reactive to light, Sclera are non-icteric. EARS, NOSE, MOUTH AND THROAT: The oropharynx is clear. The oral mucosa is pink but dry. Hearing is impossible to assess due to her ALS. LYMPH NODES:  Lymph nodes in the neck are normal. RESPIRATORY:  Lungs are coarse in all fields. CARDIOVASCULAR: Heart is regular. GI: The abdomen is soft, and mildly distended and  tympanic. Impossible to assess for tenderness secondary to patient's mental status  GU: Rectal deferred.   MUSCULOSKELETAL: Patient has chronic contractures of her extremities.   SKIN: Turgor is good. NEUROLOGIC: Impossible to assess motor or sensation due to patient's neurologic condition. PSYCH:  ALS.  Data Reviewed I reviewed the patient's images which consist of multiple days of plain film x-rays. There appears to been  dilated bowel on every x-ray for the last 5 days. Today's films also showed dilatation of large and small bowel. Reviewing today's labs show a slight hypokalemia at 3.4 otherwise within normal limits. CBC from yesterday showed a mild leukocytosis of 13.4 and anemia of 7. 9/24.3. I have personally reviewed the patient's imaging, laboratory findings and medical records.    Assessment    55 year old female with likely adynamic ileus    Plan    55 year old female with ALS and likely adynamic ileus. Patient has had an NG tube for the last 5 days that has been used for feeds but has never been used for decompression. NG tube was clamped upon my evaluation. There is been no documentation of emesis. Difficult to assess bowel function secondary to patient's mental status. Recommend placing this NG tube to intermittent low suction to see whether or not this affects her likely chronic abdominal distention. The underlying cause of her ileus is either her residual, recurrent urinary tract infection, recent need for intubation secondary to possible aspiration, C. difficile colitis. Surgery will continue to follow along with you at this time.     Time spent with the patient was 30 minutes, with more than 50% of the time spent in face-to-face education, counseling and care coordination.     Ricarda Frame, MD FACS General Surgeon 09/17/2015, 11:18 PM

## 2015-09-17 NOTE — Progress Notes (Signed)
Spoke with Dr. Higinio Roger about patient anxiety level and being tearful. No PRN medications available to give at this time.  Nurse has attempted to console, reposition, and used therapeutic touch without success.

## 2015-09-17 NOTE — Progress Notes (Signed)
Dr. Thersa Salt notified of hypoglycemia, orders received.
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
## 2015-09-17 NOTE — Progress Notes (Signed)
Pt finally feeling better with pain control. Weaned off Precedex. Rectal tube for pressure relief per Dr. Sheryle Hail. Edematous and third spacing. Abdomen tight, distended and very taut. Family was concerned about pt condition at the beginning of the shift. Continue to monitor. Plan to wean off vent.

## 2015-09-17 NOTE — Progress Notes (Signed)
[facility] [place] [facility]   PATIENT NAME: Jennifer [name]    MR#:  [id number]  DATE OF BIRTH:  06/20/1961  SUBJECTIVE:  CHIEF COMPLAINT:   Chief Complaint  Patient presents with  . Altered Mental Status   - failing trial for last 2 days, tolerating for 2-3 hrs then gets tired.    Has bowel distension and constipation now. Feeding held. Patient is awake and alert    REVIEW OF SYSTEMS:  Review of Systems  Unable to perform ROS: other    She nodes No to any complains or pains, Detailed ROS not possible due to her being on ventilator.  DRUG ALLERGIES:   Allergies  Allergen Reactions  . Bee Venom Anaphylaxis  . Hydrocodone Other (See Comments)    Reaction:  Muscle stiffness   . Lipitor [Atorvastatin] Other (See Comments)    Reaction:  Muscle cramps   . Shellfish Allergy Anaphylaxis  . Oxycodone Other (See Comments)    Reaction:  Muscle stiffness   . Prednisone Other (See Comments)    Reaction:  Psychosis   . Vicodin [Hydrocodone-Acetaminophen] Other (See Comments)    Reaction:  Muscle stiffness   . Oxycontin [Oxycodone Hcl] Other (See Comments)    Reaction:  Muscle stiffness   . Peanut-Containing Drug Products Diarrhea    VITALS:  Blood pressure 136/83, pulse 102, temperature 97.8 F (36.6 C), temperature source Axillary, resp. rate 23, height  (1.575 m), weight 57.4 kg (126 lb 8.7 oz), SpO2 100 %.  PHYSICAL EXAMINATION:  Physical Exam  GENERAL:  55 y.o.-year-old patient lying in the bed with respiratory distress, on ventilator, Patient has ALS. Appears contracted in bed EYES: Pupils equal, round, reactive to light and accommodation. No scleral icterus. HEENT: Head atraumatic, normocephalic. Oropharynx and nasopharynx clear.  NECK:  Supple, no jugular venous distention. No thyroid enlargement, no tenderness.  LUNGS: Diminished and coarse breath sounds bilaterally, no use of accessory muscles of respiration. Minimal rales and  rhonchi , ETT , on vent support. CARDIOVASCULAR: S1, S2 normal. No murmurs, rubs, or gallops.  ABDOMEN: Soft, nontender, distended. Bowel sounds absent. No organomegaly or mass.  EXTREMITIES: No pedal edema, cyanosis, or clubbing.  NEUROLOGIC: Lying in bed, contracted appearing, .not following commands, PSYCHIATRIC: Could not be elicited as the patient is on ventilator support. SKIN: No obvious rash, lesion, or ulcer.    LABORATORY PANEL:   CBC  Recent Labs Lab 09/16/15 0443  WBC 13.4*  HGB 7.9*  HCT 24.3*  PLT 365   ------------------------------------------------------------------------------------------------------------------  Chemistries   Recent Labs Lab 09/17/15 0511  NA 139  K 3.4*  CL 111  CO2 21*  GLUCOSE 71  BUN 8  CREATININE 0.39*  CALCIUM 7.7*  MG 1.8   ------------------------------------------------------------------------------------------------------------------  Cardiac Enzymes No results for input(s): TROPONINI in the last 168 hours. ------------------------------------------------------------------------------------------------------------------  RADIOLOGY:  Dg Abd 1 View  09/17/2015  CLINICAL DATA:  Ileus. EXAM: ABDOMEN - 1 VIEW COMPARISON:  09/16/2015. FINDINGS: NG tube in stable position. Persistent distended loops of small and large bowel present. Air is noted within the rectum. Findings consistent adynamic ileus distal colonic partial obstruction cannot be excluded. Residual barium noted in the colon. No pneumatosis. No free air. Overall increased density of the abdomen noted. Ascites cannot be excluded. No acute bony abnormality. IMPRESSION: 1.  NG tube in stable position. 2. Persistent distention of small and large bowel suggesting adynamic ileus. Air is noted in the rectum. A partial colonic obstruction cannot be  excluded. No interim improvement. 3. Overall increased density of the abdomen noted. Ascites cannot be excluded. Electronically  Signed   By: Maisie Fus  Register   On: 09/17/2015 07:19   Dg Abd 1 View  09/16/2015  CLINICAL DATA:  Respiratory failure.  Ileus. EXAM: ABDOMEN - 1 VIEW COMPARISON:  09/13/2015. FINDINGS: NG tube in stable position. Persistent dilated loops of small bowel noted. Dilated loops of colon noted on today's exam. These findings are again consistent with adynamic ileus. Continued follow-up exam suggested demonstrate resolution to exclude bowel obstruction. No pneumatosis or free air noted. Retained barium noted. No acute bony abnormality. IMPRESSION: 1. NG tube in stable position. 2. Persistent dilated loops of small bowel noted. Dilated loops of colon are noted on today's exam. Findings most consistent with adynamic ileus. Continued follow-up exams suggested to exclude bowel obstruction. Electronically Signed   By: Maisie Fus  Register   On: 09/16/2015 07:19   Dg Chest Port 1 View  09/16/2015  CLINICAL DATA:  Acute respiratory failure. EXAM: PORTABLE CHEST 1 VIEW COMPARISON:  09/15/2015. FINDINGS: The heart remains normal in size. No significant change in bilateral patchy and linear densities. Poor inspiration. Endotracheal tube in satisfactory position. Nasogastric tube tip in the in the region of the gastric pylorus. The left jugular catheter tip in the superior vena cava. Unremarkable bones. IMPRESSION: Stable bilateral atelectasis without definite pneumonia. Electronically Signed   By: Beckie Salts M.D.   On: 09/16/2015 07:20     ASSESSMENT AND PLAN:   55 year old female with known history of ALS with chronic Foley catheter and prior history of multiple UTIs, depression, GERD was brought in secondary to altered mental status.   #Acute hypoxic respiratory failure probably secondary to aspiration pneumonia/sepsis and ALS  intubated 09/11/2015 by intensivist Status post bronchoscopy and BAL 09/11/2015-cultures negative for bacteria, but growing yeast. SBT trials failed again. Central line is placed 2/15 On  tube feeds- which is held now Continue current antibiotics- ID on case. Appreciate pulmonologist input Dr. Bard Herbert discussed with husband and pt about further options, and possibility of reintubation or trach.  # Metabolic encephalopathy-secondary to sepsis -Leukocytosis is trending down -Continue current IV antibiotic Zyvox, zosyn  and  Oral Vancomycin, per ID recommendations  Sepsis- VRE UTI , C. difficile and pneumonia now. -ID consult appreciated.  -- Influenza test is negative.   # c diff   On vanc via tube. Now diarrhea resolved, have constipation   Also added IV flagyl and PR vanc due to ileus.  # hypokalemia-known history of chronic hypokalemia and takes potassium supplements at home. Replace as needed   # seizure disorder-continue Keppra    # ALS-following with neurologist at [facility]. -Bed bound at baseline, but still able to sit with assistance and feed herself and alert and interactive. -Continue to monitor as patient is not close to her baseline. Also takes antidepressants and muscle relaxants for the same.  # Dysphagia- was evaluated by speech therapy and was on dysphagia diet Currently nothing by mouth, ON tube feeds hold due to ileus  # Severe Malnutrition- dietary consulted Started tube feeds while patient is nothing by mouth and on ventilator  # DVT prophylaxis-on Lovenox  # Pneumonia, probably from aspiration   Linezolid , zosyn Discussed with Dr. Sampson Goon ID, appreciate his recommendations  All the records are reviewed and case discussed with Care Management/Social Workerr. Management plans discussed with the patient's husband at bedside, aware of the plan  CODE STATUS: Full Code  TOTAL CRITICAL CARE TIME TAKING  CARE OF THIS PATIENT: 35 minutes.   POSSIBLE D/C IN ? DAYS, DEPENDING ON CLINICAL CONDITION.   Altamese Dilling M.D on 09/17/2015 at 8:22 AM  Between 7am to 6pm - Pager - [phone]  After 6pm go to [email or web address] - password [email or web address]  [facility]  [facility]  Office  [phone]  CC: Primary care physician; Ruthe Mannan, MD

## 2015-09-17 NOTE — Progress Notes (Signed)
Nurse went into patient room to give meds and patient was crying and when asked if she was hurting she said yes and localized pain to her abdomen.  Abdomen assessed with no changes from previous assessment but patient was noted to have stool between legs.  Patient was rolled with nursing assistance, upon rolling patient rectal tube was out.  Patient cleaned up and nurse explained need and process of inserting rectal tube again, but patient refused.  MD to be notified.

## 2015-09-17 NOTE — Progress Notes (Signed)
Patient extubated per order by respiratory therapist.  Patient placed on 2L nasal cannula.  Patient with raspy voice but able to state name and has good cough on command.  VSS.  Patient tolerated extubation well.

## 2015-09-17 NOTE — Progress Notes (Signed)
Nurse concerned about patient with undocumented BM, KUB abnormal and showing no improvements, abdomin is distended with hypoactive bowel sounds, patient had rectal tube placed on previous shift for decompression and patient reports no relief.  Patient also with history of prolapsed rectum and hemorrhoids.  Dr. Elisabeth Pigeon notified of concerns.  Verbal orders obtained.  Tube feeds d/c, surgical consult obtained, OG to be changed to NG. Per Dr. Elisabeth Pigeon he will speak with dietitian about possible TPN.

## 2015-09-17 NOTE — Progress Notes (Signed)
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 Developed labored breathing requiring BiPAP with subsequent intubation, had a bronchoscopy with BAL, currently being monitored in ICU. Course has been complicated by development of ileus and C. difficile, which may be complicating her weaning from the ventilator.    ASSESSMENT / PLAN: 56 year old female past medical history of ALS, chronic Foley catheter, recurrent UTI,initially admitted on 2/8 weakness, hallucination, secondary to possible UTI/sepsis, hospitalization complicated with C. Difficile, respiratory failure requiring intubation, now with slow respiratory improvement.   A: PULMONARY OETT 8.0 Respiratory failure-ventilator dependent-difficult wean ALS stage 4 Right lower lobe atelectasis/pneumonia P:  -The patient's MIP's have generally not been satisfactory, though she has tolerated weaning trial yesterday. We'll attempt again today and consider extubation if tolerated. If  the patient requires intubation will  likely require tracheostomy.   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 Difficile antigen Sepsis 2/2 UTI P:  - Continue vancomycin, flagyl and d/c  zosyn and zyvox -Culture if febrile  A: ENDOCRINE Elevated blood glucose without diabetes P -ICU hypo/ hyperglycemic protocol  A: NEUROLOGIC ALS Seizure disorder Chronic anxiety P:   -Continue with ALS medications, bed bound at baseline, able to sit without assistance, able to verbally communicate at baseline. -continue  nuedexta at home dose. -Continue with antidepressants  -Continue her Keppra -Low dose precedex for mild agitation  -Continue  home dose of valium -Avoid over sedation, hold zanaflex.    Cultures: BCx2  UC 2/10-VRE Sputum MRSA PCR negative C. Difficile-antigen positive Influenza panel negative 02/15:BAL>NTD  STUDIES:   None   SIGNIFICANT EVENTS: 2/15>> intubated, bronchoscopy BAL-Cultures negative todate  Discussed with husband will extubate if the weaning trial tolerated.  SUBJECTIVE:  No acute events overnight, rectal tube was placed. XRay showed ileus.   VITAL SIGNS: Temp:  [97.8 F (36.6 C)-98.6 F (37 C)] 97.8 F (36.6 C) (02/21 0400) Pulse Rate:  [81-116] 102 (02/21 0700) Resp:  [18-37] 23 (02/21 0700) BP: (66-166)/(47-117) 136/83 mmHg (02/21 0700) SpO2:  [88 %-100 %] 100 % (02/21 0700) FiO2 (%):  [30 %] 30 % (02/21 0600) Weight:  [126 lb 8.7 oz (57.4 kg)] 126 lb 8.7 oz (57.4 kg) (02/21 0359) HEMODYNAMICS: CVP:  [5 mmHg-23 mmHg] 8 mmHg VENTILATOR SETTINGS: Vent Mode:  [-] PRVC FiO2 (%):  [30 %] 30 % Set Rate:  [15 bmp] 15 bmp Vt Set:  [400 mL] 400 mL PEEP:  [5 cmH20] 5 cmH20 Pressure Support:  [5 cmH20] 5 cmH20 Plateau Pressure:  [20 cmH20] 20 cmH20 INTAKE / OUTPUT:  Intake/Output Summary (Last 24 hours) at 09/17/15 4098 Last data filed at 09/17/15 0200  Gross per 24 hour  Intake 1191.12 ml  Output   1170 ml  Net  21.12 ml    Review of Systems  Unable to perform ROS: intubated    Physical Exam  Constitutional:  No distress.  Chronically ill looking  HENT:  Head: Normocephalic and atraumatic.  Right Ear:  External ear normal.  Left Ear: External ear normal.  ETT  and OGT  Eyes: Conjunctivae and EOM are normal. Pupils are equal, round, and reactive to light.  Neck: Normal range of motion. Neck supple. No thyromegaly present.  Cardiovascular: Normal rate, regular rhythm, normal heart sounds and intact distal pulses.   No murmur heard. Pulmonary/Chest: No respiratory distress. She has no wheezes. She has no rales.  Shallow breaths on PSV, Bilateral airflow with  diminished breathsounds at the bases  Abdominal: Soft. Bowel sounds are normal. She exhibits no distension.  Musculoskeletal: She exhibits no edema or tenderness.  Contractures in bilateral lower extremities  Neurological: She is alert.  Awake on the vent, following commands, moves upper extremities but responses are sluggist   Skin: Skin is warm and dry.  Nursing note and vitals reviewed.    LABS:  CBC  Recent Labs Lab 09/13/15 0505 09/15/15 0502 09/16/15 0443  WBC 17.1* 15.8* 13.4*  HGB 9.4* 8.0* 7.9*  HCT 28.8* 24.1* 24.3*  PLT 440 366 365   Coag's No results for input(s): APTT, INR in the last 168 hours. BMET  Recent Labs Lab 09/15/15 0502 09/16/15 0443 09/17/15 0511  NA 138 136 139  K 3.1* 3.5 3.4*  CL 110 109 111  CO2 22 24 21*  BUN CREATININE 0.31* 0.39* 0.39*  GLUCOSE 115* 112* 71   Electrolytes  Recent Labs Lab 09/15/15 0502 09/16/15 0443 09/17/15 0511  CALCIUM 7.2* 7.4* 7.7*  MG 2.2 1.9 1.8  PHOS 2.3* 3.4 3.7   Sepsis Markers No results for input(s): LATICACIDVEN, PROCALCITON, O2SATVEN in the last 168 hours. ABG  Recent Labs Lab 09/11/15 1759 09/14/15 0938 09/16/15 0500  PHART 7.36 7.48* 7.45  PCO2ART 43 27* 29*  PO2ART 67* 112* 108   Liver Enzymes  Recent Labs Lab 09/12/15 0343  ALBUMIN 1.7*   Cardiac Enzymes No results for input(s): TROPONINI, PROBNP in the last 168 hours. Glucose  Recent Labs Lab 09/16/15 1140 09/16/15 1628 09/16/15 1947 09/16/15 2334  09/17/15 0340 09/17/15 0726  GLUCAP 92 90 90 89 80 75    Imaging Dg Abd 1 View  09/17/2015  CLINICAL DATA:  Ileus. EXAM: ABDOMEN - 1 VIEW COMPARISON:  09/16/2015. FINDINGS: NG tube in stable position. Persistent distended loops of small and large bowel present. Air is noted within the rectum. Findings consistent adynamic ileus distal colonic partial obstruction cannot be excluded. Residual barium noted in the colon. No pneumatosis. No free air. Overall increased density of the abdomen noted. Ascites cannot be excluded. No acute bony abnormality. IMPRESSION: 1.  NG tube in stable position. 2. Persistent distention of small and large bowel suggesting adynamic ileus. Air is noted in the rectum. A partial colonic obstruction cannot be excluded. No interim improvement. 3. Overall increased density of the abdomen noted. Ascites cannot be excluded. Electronically Signed   By: Maisie Fus  Register   On: 09/17/2015 07:19      Deep Nicholos Johns, M.D.   Critical Care Attestation.  I have personally obtained a history, examined the patient, evaluated laboratory and imaging results, formulated the assessment and plan and placed orders. The Patient requires high complexity decision making for assessment and support, frequent evaluation and titration of therapies, application of advanced monitoring technologies and extensive interpretation of multiple databases. The patient has critical illness that could lead imminently to failure of 1 or more organ  systems and requires the highest level of physician preparedness to intervene.  Critical Care Time devoted to patient care services described in this note is 35 minutes and is exclusive of time spent in procedures.

## 2015-09-17 NOTE — Progress Notes (Signed)
[facility] [place] [facility]   PATIENT NAME: Jennifer [name]    MR#:  [id number]  DATE OF BIRTH:  1960-10-11  SUBJECTIVE:  CHIEF COMPLAINT:   Chief Complaint  Patient presents with  . Altered Mental Status   - failing trial for last 2 days, tolerating for 2-3 hrs then gets tired.- finally extubated 09/17/15    Has bowel distension and constipation now. Feeding held. Patient is awake and alert - had good BM 09/17/15- and rectal tube came out.   REVIEW OF SYSTEMS:  Review of Systems  Unable to perform ROS: other    She nodes No to any complains or pains, Detailed ROS not possible due to her being non verbal  DRUG ALLERGIES:   Allergies  Allergen Reactions  . Bee Venom Anaphylaxis  . Hydrocodone Other (See Comments)    Reaction:  Muscle stiffness   . Lipitor [Atorvastatin] Other (See Comments)    Reaction:  Muscle cramps   . Shellfish Allergy Anaphylaxis  . Oxycodone Other (See Comments)    Reaction:  Muscle stiffness   . Prednisone Other (See Comments)    Reaction:  Psychosis   . Vicodin [Hydrocodone-Acetaminophen] Other (See Comments)    Reaction:  Muscle stiffness   . Oxycontin [Oxycodone Hcl] Other (See Comments)    Reaction:  Muscle stiffness   . Peanut-Containing Drug Products Diarrhea    VITALS:  Blood pressure 113/82, pulse 101, temperature 98.5 F (36.9 C), temperature source Oral, resp. rate 32, height 5\' 2"  (1.575 m), weight 57.4 kg (126 lb 8.7 oz), SpO2 100 %.  PHYSICAL EXAMINATION:  Physical Exam  GENERAL:  55 y.o.-year-old patient lying in the bed with no respiratory distress, Patient has ALS. Appears contracted in bed EYES: Pupils equal, round, reactive to light and accommodation. No scleral icterus. HEENT: Head atraumatic, normocephalic. Oropharynx and nasopharynx clear.  NECK:  Supple, no jugular venous distention. No thyroid enlargement, no tenderness.  LUNGS: Diminished and coarse breath sounds bilaterally, no use of  accessory muscles of respiration. Minimal rales and rhonchi , Off ventilator now. CARDIOVASCULAR: S1, S2 normal. No murmurs, rubs, or gallops.  ABDOMEN: Soft, nontender, distended. Bowel sounds present. No organomegaly or mass.  EXTREMITIES: No pedal edema, cyanosis, or clubbing.  NEUROLOGIC: Lying in bed, contracted appearing, .not following commands,but nodes her head, non verbal. PSYCHIATRIC: Could not be elicited as the patient is non verbal. SKIN: pressure ulcer.    LABORATORY PANEL:   CBC  Recent Labs Lab 09/16/15 0443  WBC 13.4*  HGB 7.9*  HCT 24.3*  PLT 365   ------------------------------------------------------------------------------------------------------------------  Chemistries   Recent Labs Lab 09/17/15 0511  NA 139  K 3.4*  CL 111  CO2 21*  GLUCOSE 71  BUN 8  CREATININE 0.39*  CALCIUM 7.7*  MG 1.8   ------------------------------------------------------------------------------------------------------------------  Cardiac Enzymes No results for input(s): TROPONINI in the last 168 hours. ------------------------------------------------------------------------------------------------------------------  RADIOLOGY:  Dg Abd 1 View  09/17/2015  CLINICAL DATA:  55 year old female status post NG tube placement. Initial encounter. EXAM: ABDOMEN - 1 VIEW COMPARISON:  0547 hours today and earlier. FINDINGS: KUB view of the abdomen at 1333 hours. Worsening gaseous distension of mid in left abdominal bowel loops since 09/13/2015. The pelvis is not included on this image. Increase gray hazy opacity in the abdomen over the recent series of exams raises the possibility of ascites. Enteric tube is in placed with side hole at the level of the stomach. Abnormal lung bases appear stable since 09/16/2015.  IMPRESSION: 1. Abnormal bowel gas pattern, worsening since 09/13/2015, suspicious for Acute Bowel Obstruction and Ascites. CT Abdomen and Pelvis (IV and oral contrast  preferred) would evaluate further. 2. NG tube tip and side hole at the level of the stomach. Electronically Signed   By: Odessa Fleming M.D.   On: 09/17/2015 14:08   Dg Abd 1 View  09/17/2015  CLINICAL DATA:  Ileus. EXAM: ABDOMEN - 1 VIEW COMPARISON:  09/16/2015. FINDINGS: NG tube in stable position. Persistent distended loops of small and large bowel present. Air is noted within the rectum. Findings consistent adynamic ileus distal colonic partial obstruction cannot be excluded. Residual barium noted in the colon. No pneumatosis. No free air. Overall increased density of the abdomen noted. Ascites cannot be excluded. No acute bony abnormality. IMPRESSION: 1.  NG tube in stable position. 2. Persistent distention of small and large bowel suggesting adynamic ileus. Air is noted in the rectum. A partial colonic obstruction cannot be excluded. No interim improvement. 3. Overall increased density of the abdomen noted. Ascites cannot be excluded. Electronically Signed   By: Maisie Fus  Register   On: 09/17/2015 07:19   Dg Abd 1 View  09/16/2015  CLINICAL DATA:  Respiratory failure.  Ileus. EXAM: ABDOMEN - 1 VIEW COMPARISON:  09/13/2015. FINDINGS: NG tube in stable position. Persistent dilated loops of small bowel noted. Dilated loops of colon noted on today's exam. These findings are again consistent with adynamic ileus. Continued follow-up exam suggested demonstrate resolution to exclude bowel obstruction. No pneumatosis or free air noted. Retained barium noted. No acute bony abnormality. IMPRESSION: 1. NG tube in stable position. 2. Persistent dilated loops of small bowel noted. Dilated loops of colon are noted on today's exam. Findings most consistent with adynamic ileus. Continued follow-up exams suggested to exclude bowel obstruction. Electronically Signed   By: Maisie Fus  Register   On: 09/16/2015 07:19   Dg Chest Port 1 View  09/16/2015  CLINICAL DATA:  Acute respiratory failure. EXAM: PORTABLE CHEST 1 VIEW COMPARISON:   09/15/2015. FINDINGS: The heart remains normal in size. No significant change in bilateral patchy and linear densities. Poor inspiration. Endotracheal tube in satisfactory position. Nasogastric tube tip in the in the region of the gastric pylorus. The left jugular catheter tip in the superior vena cava. Unremarkable bones. IMPRESSION: Stable bilateral atelectasis without definite pneumonia. Electronically Signed   By: Beckie Salts M.D.   On: 09/16/2015 07:20     ASSESSMENT AND PLAN:   55 year old female with known history of ALS with chronic Foley catheter and prior history of multiple UTIs, depression, GERD was brought in secondary to altered mental status.   #Acute hypoxic respiratory failure probably secondary to aspiration pneumonia/sepsis and ALS  intubated 09/11/2015 by intensivist Status post bronchoscopy and BAL 09/11/2015-cultures negative for bacteria, but growing yeast. SBT trials failed for few days. Central line is placed 2/15 Continue current antibiotics- ID on case. Appreciate pulmonologist input Extubated on 09/17/15  # Metabolic encephalopathy-secondary to sepsis -Leukocytosis is trending down -Continue current IV antibiotic Zyvox, zosyn  and  Oral Vancomycin, per ID recommendations  Sepsis- VRE UTI , C. difficile and pneumonia now. -ID consult appreciated.  -- Influenza test is negative.   # c diff   On vanc via tube. Now diarrhea resolved, have constipation   Also added IV flagyl and PR vanc due to ileus.   But noe rectal tube came out. ileus is improving.  # hypokalemia-known history of chronic hypokalemia and takes potassium supplements at home. Replace  as needed   # seizure disorder-continue Keppra    # ALS-[facility] neurolog[facility] -Bed bound at baseline, but still able to sit with assistance and feed herself and alert and interactive. -Continue to monitor as patient is not close to her baseline. Also takes antidepressants and muscle relaxants for  the same.  # Dysphagia- was evaluated by speech therapy and was on dysphagia diet Currently nothing by mouth, ON tube feeds hold due to ileus  # Severe Malnutrition- dietary consulted Started tube feeds while patient is nothing by mouth  She may be started soon on tube feeds. ( we were almost thinking of TPN, but now she had good BM on 09/17/15)  # hypoglycemia   IV D5NS. As she is NPO for now.  # DVT prophylaxis-on Lovenox  # Pneumonia, probably from aspiration   Linezolid , zosyn Discussed with Dr. Sampson Goon ID, appreciate his recommendations  All the records are reviewed and case discussed with Care Management/Social Workerr. Management plans discussed with the patient's husband at bedside, aware of the plan  CODE STATUS: Full Code  TOTAL CRITICAL CARE TIME TAKING CARE OF THIS PATIENT: 35 minutes.  Discussed with her husband in room.  POSSIBLE D/C IN ? DAYS, DEPENDING ON CLINICAL CONDITION.   Altamese Dilling M.D on 09/17/2015 at 7:17 PM  Between 7am to 6pm - Pager - [phone]  After 6pm go to [email or web address] - password [email or web address] [facility]  [facility]  Office  [phone]  CC: Primary care physician; Ruthe Mannan, MD

## 2015-09-17 NOTE — Progress Notes (Signed)
PHARMACY - CRITICAL CARE PROGRESS NOTE  Pharmacy Consult for electrolyte management      Allergies  Allergen Reactions  . Bee Venom Anaphylaxis  . Hydrocodone Other (See Comments)    Reaction:  Muscle stiffness   . Lipitor [Atorvastatin] Other (See Comments)    Reaction:  Muscle cramps   . Shellfish Allergy Anaphylaxis  . Oxycodone Other (See Comments)    Reaction:  Muscle stiffness   . Prednisone Other (See Comments)    Reaction:  Psychosis   . Vicodin [Hydrocodone-Acetaminophen] Other (See Comments)    Reaction:  Muscle stiffness   . Oxycontin [Oxycodone Hcl] Other (See Comments)    Reaction:  Muscle stiffness   . Peanut-Containing Drug Products Diarrhea    Patient Measurements: Height:  (157.5 cm) Weight: 126 lb 8.7 oz (57.4 kg) IBW/kg (Calculated) : 50.1  Vital Signs: Temp: 97.8 F (36.6 C) (02/21 0400) Temp Source: Axillary (02/21 0400) BP: 136/83 mmHg (02/21 0700) Pulse Rate: 102 (02/21 0700) Intake/Output from previous day: 02/20 0701 - 02/21 0700 In: 1417.6 [I.V.:712.6; IV Piggyback:705] Out: 1170 [Urine:1170] Intake/Output from this shift:   Vent settings for last 24 hours: Vent Mode:  [-] PRVC FiO2 (%):  [30 %] 30 % Set Rate:  [15 bmp] 15 bmp Vt Set:  [400 mL] 400 mL PEEP:  [5 cmH20] 5 cmH20 Pressure Support:  [5 cmH20] 5 cmH20 Plateau Pressure:  [20 cmH20] 20 cmH20  Labs:  Recent Labs  09/15/15 0502 09/16/15 0443 09/17/15 0511  WBC 15.8* 13.4*  --   HGB 8.0* 7.9*  --   HCT 24.1* 24.3*  --   PLT 366 365  --   CREATININE 0.31* 0.39* 0.39*  MG 2.2 1.9 1.8  PHOS 2.3* 3.4 3.7   Estimated Creatinine Clearance: 63.6 mL/min (by C-G formula based on Cr of 0.39).   Recent Labs  09/16/15 2334 09/17/15 0340 09/17/15 0726  GLUCAP 89 80 75    Microbiology: Recent Results (from the past 720 hour(s))  Urine culture     Status: None   Collection Time: 09/04/15  9:01 PM  Result Value Ref Range Status   Specimen Description URINE, RANDOM   Final   Special Requests NONE  Final   Culture MULTIPLE SPECIES PRESENT, SUGGEST RECOLLECTION  Final   Report Status 09/06/2015 FINAL  Final  Blood Culture (routine x 2)     Status: None   Collection Time: 09/04/15  9:41 PM  Result Value Ref Range Status   Specimen Description BLOOD RIGHT ARM  Final   Special Requests BOTTLES DRAWN AEROBIC AND ANAEROBIC  Final   Culture NO GROWTH 5 DAYS  Final   Report Status 09/09/2015 FINAL  Final  Blood Culture (routine x 2)     Status: None   Collection Time: 09/04/15  9:41 PM  Result Value Ref Range Status   Specimen Description BLOOD LEFT ARM  Final   Special Requests BOTTLES DRAWN AEROBIC AND ANAEROBIC  Final   Culture NO GROWTH 5 DAYS  Final   Report Status 09/09/2015 FINAL  Final  Urine culture     Status: None   Collection Time: 09/06/15  4:58 PM  Result Value Ref Range Status   Specimen Description URINE, CATHETERIZED  Final   Special Requests ertapenem and vanco Normal  Final   Culture   Final    >=100,000 COLONIES/mL VANCOMYCIN RESISTANT ENTEROCOCCUS ENTEROCOCCUS FAECIUM VRE HAVE INTRINSIC RESISTANCE TO MOST COMMONLY USED ANTIBIOTICS AND THE ABILITY TO ACQUIRE RESISTANCE TO  MOST AVAILABLE ANTIBIOTICS. WITH MIXED BACTERIAL ORGANISMS    Report Status 09/10/2015 FINAL  Final   Organism ID, Bacteria VANCOMYCIN RESISTANT ENTEROCOCCUS  Final      Susceptibility   Vancomycin resistant enterococcus - MIC*    AMPICILLIN >=32 RESISTANT Resistant     LEVOFLOXACIN >=8 RESISTANT Resistant     NITROFURANTOIN 128 RESISTANT Resistant     VANCOMYCIN >=32 RESISTANT Resistant     LINEZOLID 2 SENSITIVE Sensitive     * >=100,000 COLONIES/mL VANCOMYCIN RESISTANT ENTEROCOCCUS  C difficile quick scan w PCR reflex     Status: Abnormal   Collection Time: 09/09/15  4:14 PM  Result Value Ref Range Status   C Diff antigen POSITIVE (A) NEGATIVE Final   C Diff toxin NEGATIVE NEGATIVE Final   C Diff interpretation   Final    Positive for toxigenic  C. difficile, active toxin production not detected. Patient has toxigenic C. difficile organisms present in the bowel, but toxin was not detected. The patient may be a carrier or the level of toxin in the sample was below the limit  of detection. This information should be used in conjunction with the patient's clinical history when deciding on possible therapy.     Comment: CRITICAL RESULT CALLED TO, READ BACK BY AND VERIFIED WITH: Burnett Corrente ON 09/09/15 AT 2019 BY TLB   Clostridium Difficile by PCR     Status: Abnormal   Collection Time: 09/09/15  4:14 PM  Result Value Ref Range Status   Toxigenic C Difficile by pcr POSITIVE (A) NEGATIVE Final    Comment: CRITICAL RESULT CALLED TO, READ BACK BY AND VERIFIED WITH: Burnett Corrente ON 09/09/15 AT 2019 BY TLB   MRSA PCR Screening     Status: None   Collection Time: 09/11/15  6:52 AM  Result Value Ref Range Status   MRSA by PCR NEGATIVE NEGATIVE Final    Comment:        The GeneXpert MRSA Assay (FDA approved for NASAL specimens only), is one component of a comprehensive MRSA colonization surveillance program. It is not intended to diagnose MRSA infection nor to guide or monitor treatment for MRSA infections.   Culture, bal-quantitative     Status: None   Collection Time: 09/11/15  2:21 PM  Result Value Ref Range Status   Specimen Description BRONCHIAL ALVEOLAR LAVAGE  Final   Special Requests Normal BRONCHIAL BRUSHING  Final   Gram Stain   Final    FEW WBC SEEN RARE SQUAMOUS EPITHELIAL CELLS PRESENT NO ORGANISMS SEEN    Culture Consistent with normal respiratory flora.  Final   Report Status 09/13/2015 FINAL  Final  Fungus Culture with Smear     Status: None (Preliminary result)   Collection Time: 09/11/15  2:21 PM  Result Value Ref Range Status   Specimen Description BRONCHIAL BRUSHING  Final   Special Requests Normal  Final   Culture CANDIDA GLABRATA  Final   Report Status PENDING  Incomplete    Medications:   Scheduled:  . antiseptic oral rinse  7 mL Mouth Rinse QID  . chlorhexidine gluconate  15 mL Mouth Rinse BID  . Dextromethorphan-Quinidine  1 capsule Oral BID  . enoxaparin (LOVENOX) injection  40 mg Subcutaneous Q24H  . erythromycin ethylsuccinate  200 mg Per Tube 3 times per day  . famotidine (PEPCID) IV  20 mg Intravenous Q12H  . feeding supplement (VITAL HIGH PROTEIN)  1,000 mL Per Tube Q24H  . free water  150 mL Per Tube  4 times per day  . levETIRAcetam  500 mg Intravenous Q12H  . metronidazole  500 mg Intravenous Q8H  . midazolam  4 mg Intravenous Once  . phenylephrine  1 suppository Rectal BID  . polyethylene glycol  17 g Oral BID  . potassium chloride  10 mEq Intravenous Q1 Hr x 2  . potassium chloride  40 mEq Per Tube Once  . sennosides  5 mL Oral BID  . sodium chloride  1,000 mL Intravenous Once  . tiZANidine  2 mg Oral QHS  . vancomycin  125 mg Oral 4 times per day  . vancomycin (VANCOCIN) rectal ENEMA  500 mg Rectal 4 times per day   Infusions:  . dexmedetomidine Stopped (09/16/15 2200)  . norepinephrine (LEVOPHED) Adult infusion Stopped (09/13/15 1200)    Assessment: Pharmacy consulted for electrolyte management for 55 yo female ICU patient requiring mechanical ventilation.     Plan:  Every electrolyte wnl except K  Is slightly below goal @  3.4. Will give KCl 10 mEq x 2 and will recheck with am labs.    Darrik Richman D, Pharm.D., BCPS Clinical Pharmacist 09/17/2015,7:30 AM

## 2015-09-17 NOTE — Care Management (Signed)
Abdominal work up in progress for lack of BM and distention.  Tube feeding currently on hold

## 2015-09-17 NOTE — Progress Notes (Signed)
Nutrition Follow-up  DOCUMENTATION CODES:   Severe malnutrition in context of chronic illness  INTERVENTION:   Coordination of Care: TF remains on hold, pt met criteria for severe malnutrition in context of chronic illness on admission. Pt with persistent adynamic ileus of small and large bowel, possible partial bowel obstruction. Possible extubation today. If unable to progress diet or utilize gut via EN, may need to initiate TPN. Will continue to assess  NUTRITION DIAGNOSIS:   Inadequate oral intake related to acute illness as evidenced by NPO status.  GOAL:   Patient will meet greater than or equal to 90% of their needs  MONITOR:    (Energy intake)  REASON FOR ASSESSMENT:   Consult Enteral/tube feeding initiation and management  ASSESSMENT:    Pt remains on vent, possible extubation today  Diet Order:   NPO  EN: TF remains on hold  Digestive System: abdomen remains distended and taut, NG clamped, rectal tube in place, abdominal xray from this AM with no improvement, per xray report pt with persistent distention of small and large bowel suggesting adynamic ileus. Air is noted in the rectum. A partial colonic obstruction cannot be excluded   Skin:   (stage I pressure ulcer)   Recent Labs Lab 09/15/15 0502 09/16/15 0443 09/17/15 0511  NA 138 136 139  K 3.1* 3.5 3.4*  CL 110 109 111  CO2 22 24 21*  BUN 15 13 8   CREATININE 0.31* 0.39* 0.39*  CALCIUM 7.2* 7.4* 7.7*  MG 2.2 1.9 1.8  PHOS 2.3* 3.4 3.7  GLUCOSE 115* 112* 71    Glucose Profile:  Recent Labs  09/16/15 2334 09/17/15 0340 09/17/15 0726  GLUCAP 89 80 75   Meds: potassium chloride, precedex  Height:   Ht Readings from Last 1 Encounters:  09/11/15 5' 2"  (1.575 m)    Weight:   Wt Readings from Last 1 Encounters:  09/17/15 126 lb 8.7 oz (57.4 kg)   Filed Weights   09/15/15 0716 09/16/15 0532 09/17/15 0359  Weight: 115 lb 11.9 oz (52.5 kg) 122 lb 5.7 oz (55.5 kg) 126 lb 8.7 oz (57.4  kg)    BMI:  Body mass index is 23.14 kg/(m^2).  Estimated Nutritional Needs:   Kcal:  1123kcals, (BEE: 1011kcals, Ve: 6, Tmax: 37) using [employer] Equation and current weight of 45.8kg  Protein:  69-92g protein (1.5-2.0g/kg)  Fluid:  (25-60m//kg) 900-10831md  EDUCATION NEEDS:   No education needs identified at this time  HIGH Care Level  [facility], RD, LDN ([phone]  ([phone]/On-Call Pager

## 2015-09-18 ENCOUNTER — Inpatient Hospital Stay: Payer: BLUE CROSS/BLUE SHIELD

## 2015-09-18 DIAGNOSIS — K567 Ileus, unspecified: Secondary | ICD-10-CM | POA: Insufficient documentation

## 2015-09-18 DIAGNOSIS — J9601 Acute respiratory failure with hypoxia: Secondary | ICD-10-CM

## 2015-09-18 LAB — GLUCOSE, CAPILLARY
GLUCOSE-CAPILLARY: 101 mg/dL — AB (ref 65–99)
GLUCOSE-CAPILLARY: 145 mg/dL — AB (ref 65–99)
GLUCOSE-CAPILLARY: 99 mg/dL (ref 65–99)
Glucose-Capillary: 121 mg/dL — ABNORMAL HIGH (ref 65–99)
Glucose-Capillary: 55 mg/dL — ABNORMAL LOW (ref 65–99)
Glucose-Capillary: 68 mg/dL (ref 65–99)
Glucose-Capillary: 77 mg/dL (ref 65–99)
Glucose-Capillary: 90 mg/dL (ref 65–99)

## 2015-09-18 LAB — BASIC METABOLIC PANEL
Anion gap: 5 (ref 5–15)
CALCIUM: 7.6 mg/dL — AB (ref 8.9–10.3)
CO2: 24 mmol/L (ref 22–32)
CREATININE: 0.32 mg/dL — AB (ref 0.44–1.00)
Chloride: 112 mmol/L — ABNORMAL HIGH (ref 101–111)
GFR calc Af Amer: >60 mL/min (ref >60)
GFR calc non Af Amer: >60 mL/min (ref >60)
GLUCOSE: 92 mg/dL (ref 65–99)
Potassium: 3.3 mmol/L — ABNORMAL LOW (ref 3.5–5.1)
Sodium: 141 mmol/L (ref 135–145)

## 2015-09-18 LAB — CBC
HEMATOCRIT: 26.1 % — AB (ref 35.0–47.0)
Hemoglobin: 8.7 g/dL — ABNORMAL LOW (ref 12.0–16.0)
MCH: 31.1 pg (ref 26.0–34.0)
MCHC: 33.4 g/dL (ref 32.0–36.0)
MCV: 93.2 fL (ref 80.0–100.0)
PLATELETS: 494 10*3/uL — AB (ref 150–440)
RBC: 2.8 MIL/uL — ABNORMAL LOW (ref 3.80–5.20)
RDW: 15.6 % — AB (ref 11.5–14.5)
WBC: 8.6 10*3/uL (ref 3.6–11.0)

## 2015-09-18 LAB — MAGNESIUM: Magnesium: 1.8 mg/dL (ref 1.7–2.4)

## 2015-09-18 MED ORDER — METOCLOPRAMIDE HCL 5 MG/ML IJ SOLN
10.0000 mg | Freq: Three times a day (TID) | INTRAMUSCULAR | Status: AC
  Administered 2015-09-18 – 2015-09-20 (×5): 10 mg via INTRAVENOUS
  Filled 2015-09-18 (×5): qty 2

## 2015-09-18 MED ORDER — MAGNESIUM SULFATE 2 GM/50ML IV SOLN
2.0000 g | Freq: Once | INTRAVENOUS | Status: AC
  Administered 2015-09-18: 2 g via INTRAVENOUS
  Filled 2015-09-18: qty 50

## 2015-09-18 MED ORDER — POTASSIUM CHLORIDE 10 MEQ/100ML IV SOLN
10.0000 meq | INTRAVENOUS | Status: AC
Start: 2015-09-18 — End: 2015-09-18
  Administered 2015-09-18 (×2): 10 meq via INTRAVENOUS
  Filled 2015-09-18 (×2): qty 100

## 2015-09-18 MED ORDER — GI COCKTAIL ~~LOC~~
30.0000 mL | Freq: Once | ORAL | Status: AC
  Administered 2015-09-18: 30 mL
  Filled 2015-09-18: qty 30

## 2015-09-18 MED ORDER — LACTULOSE 10 GM/15ML PO SOLN
10.0000 g | Freq: Two times a day (BID) | ORAL | Status: DC
  Administered 2015-09-18 – 2015-09-23 (×9): 10 g via ORAL
  Filled 2015-09-18 (×10): qty 30

## 2015-09-18 MED ORDER — DEXTROSE 50 % IV SOLN
1.0000 | Freq: Once | INTRAVENOUS | Status: AC
Start: 2015-09-18 — End: 2015-09-18
  Administered 2015-09-18: 50 mL via INTRAVENOUS

## 2015-09-18 MED ORDER — NEOSTIGMINE METHYLSULFATE 10 MG/10ML IV SOLN
0.5000 mg | Freq: Once | INTRAVENOUS | Status: AC
  Administered 2015-09-18: 0.5 mg via INTRAVENOUS
  Filled 2015-09-18: qty 0.5

## 2015-09-18 MED ORDER — ATROPINE SULFATE 0.1 MG/ML IJ SOLN
INTRAMUSCULAR | Status: AC
  Administered 2015-09-18: 0.6 mg
  Filled 2015-09-18: qty 10

## 2015-09-18 MED ORDER — DOCUSATE SODIUM 50 MG/5ML PO LIQD
100.0000 mg | Freq: Two times a day (BID) | ORAL | Status: DC
  Administered 2015-09-18 – 2015-09-19 (×4): 100 mg via ORAL
  Filled 2015-09-18 (×6): qty 10

## 2015-09-18 MED ORDER — FAMOTIDINE 20 MG PO TABS
20.0000 mg | ORAL_TABLET | Freq: Two times a day (BID) | ORAL | Status: DC
  Administered 2015-09-18 – 2015-09-24 (×13): 20 mg via ORAL
  Filled 2015-09-18 (×13): qty 1

## 2015-09-18 MED ORDER — BUPROPION HCL 100 MG PO TABS
100.0000 mg | ORAL_TABLET | Freq: Two times a day (BID) | ORAL | Status: DC
  Administered 2015-09-18 – 2015-09-24 (×10): 100 mg via ORAL
  Filled 2015-09-18 (×12): qty 1

## 2015-09-18 MED ORDER — LACTULOSE 10 GM/15ML PO SOLN
30.0000 g | Freq: Once | ORAL | Status: AC
  Administered 2015-09-18: 30 g via ORAL
  Filled 2015-09-18: qty 60

## 2015-09-18 MED ORDER — DEXTROSE 50 % IV SOLN
INTRAVENOUS | Status: AC
  Administered 2015-09-18: 50 mL via INTRAVENOUS
  Filled 2015-09-18: qty 50

## 2015-09-18 MED ORDER — FENTANYL CITRATE (PF) 100 MCG/2ML IJ SOLN
50.0000 ug | Freq: Once | INTRAMUSCULAR | Status: AC
  Administered 2015-09-18: 50 ug via INTRAVENOUS
  Filled 2015-09-18: qty 2

## 2015-09-18 NOTE — Progress Notes (Signed)
CC: Ileus Subjective: No history obtainable from demented patient in the ICU who awoke an open eyes to voice  Objective: Vital signs in last 24 hours: Temp:  [97.8 F (36.6 C)-99.1 F (37.3 C)] 98.1 F (36.7 C) (02/22 0757) Pulse Rate:  [81-105] 85 (02/22 0800) Resp:  [18-41] 22 (02/22 0800) BP: (109-153)/(57-96) 117/66 mmHg (02/22 0800) SpO2:  [90 %-100 %] 90 % (02/22 0800) Weight:  [128 lb 4.9 oz (58.2 kg)] 128 lb 4.9 oz (58.2 kg) (02/22 0545) Last BM Date: 09/17/15  Intake/Output from previous day: 02/21 0701 - 02/22 0700 In: 1829.2 [I.V.:719.2; NG/GT:400; IV Piggyback:710] Out: 1225 [Urine:1225] Intake/Output this shift:    Physical exam:  Abdomen is distended slightly tympanitic nontender NG tube is in place  Lab Results: CBC   Recent Labs  09/16/15 0443 09/18/15 0455  WBC 13.4* 8.6  HGB 7.9* 8.7*  HCT 24.3* 26.1*  PLT 365 494*   BMET  Recent Labs  09/17/15 0511 09/18/15 0455  NA 139 141  K 3.4* 3.3*  CL 111 112*  CO2 21* 24  GLUCOSE 71 92  BUN 8 <5*  CREATININE 0.39* 0.32*  CALCIUM 7.7* 7.6*   PT/INR No results for input(s): LABPROT, INR in the last 72 hours. ABG  Recent Labs  09/16/15 0500 09/17/15 1045  PHART 7.45 7.44  HCO3 20.2* 19.7*    Studies/Results: Dg Abd 1 View  09/18/2015  CLINICAL DATA:  Follow-up ileus.  Subsequent encounter. EXAM: ABDOMEN - 1 VIEW COMPARISON:  Abdominal radiograph performed 09/17/2015 FINDINGS: The patient's enteric tube is noted ending overlying the antrum of the stomach. There is persistent diffuse distention of small and large bowel loops, perhaps slightly improved from the prior study, compatible with ileus. Scattered high-density foci are noted about the lower abdomen. No free intra-abdominal air is seen, though evaluation for free air is noted on a single supine view. No acute osseous abnormalities are identified. IMPRESSION: 1. Enteric tube noted ending overlying the antrum of the stomach. 2. Diffuse  distention of small and large bowel loops is perhaps slightly improved, though still compatible with ileus. No free intra-abdominal air seen. Electronically Signed   By: Garald Balding M.D.   On: 09/18/2015 02:58   Dg Abd 1 View  09/17/2015  CLINICAL DATA:  55 year old female status post NG tube placement. Initial encounter. EXAM: ABDOMEN - 1 VIEW COMPARISON:  0547 hours today and earlier. FINDINGS: KUB view of the abdomen at 1333 hours. Worsening gaseous distension of mid in left abdominal bowel loops since 09/13/2015. The pelvis is not included on this image. Increase gray hazy opacity in the abdomen over the recent series of exams raises the possibility of ascites. Enteric tube is in placed with side hole at the level of the stomach. Abnormal lung bases appear stable since 09/16/2015. IMPRESSION: 1. Abnormal bowel gas pattern, worsening since 09/13/2015, suspicious for Acute Bowel Obstruction and Ascites. CT Abdomen and Pelvis (IV and oral contrast preferred) would evaluate further. 2. NG tube tip and side hole at the level of the stomach. Electronically Signed   By: Genevie Ann M.D.   On: 09/17/2015 14:08   Dg Abd 1 View  09/17/2015  CLINICAL DATA:  Ileus. EXAM: ABDOMEN - 1 VIEW COMPARISON:  09/16/2015. FINDINGS: NG tube in stable position. Persistent distended loops of small and large bowel present. Air is noted within the rectum. Findings consistent adynamic ileus distal colonic partial obstruction cannot be excluded. Residual barium noted in the colon. No pneumatosis. No free air.  Overall increased density of the abdomen noted. Ascites cannot be excluded. No acute bony abnormality. IMPRESSION: 1.  NG tube in stable position. 2. Persistent distention of small and large bowel suggesting adynamic ileus. Air is noted in the rectum. A partial colonic obstruction cannot be excluded. No interim improvement. 3. Overall increased density of the abdomen noted. Ascites cannot be excluded. Electronically Signed   By:  Marcello Moores  Register   On: 09/17/2015 07:19    Anti-infectives: Anti-infectives    Start     Dose/Rate Route Frequency Ordered Stop   09/16/15 1800  vancomycin (VANCOCIN) 500 mg in sodium chloride irrigation 0.9 % 100 mL ENEMA     500 mg Rectal 4 times per day 09/16/15 1535     09/16/15 1630  erythromycin ethylsuccinate (EES) 200 MG/5ML suspension 200 mg  Status:  Discontinued    Comments:  For GI motility   200 mg Per Tube 3 times per day 09/16/15 1629 09/18/15 1026   09/16/15 1600  metroNIDAZOLE (FLAGYL) IVPB 500 mg     500 mg 100 mL/hr over 60 Minutes Intravenous Every 8 hours 09/16/15 1535     09/11/15 2200  linezolid (ZYVOX) tablet 600 mg  Status:  Discontinued     600 mg Oral Every 12 hours 09/10/15 1502 09/10/15 2216   09/11/15 2200  linezolid (ZYVOX) tablet 600 mg  Status:  Discontinued     600 mg Oral Every 12 hours 09/10/15 2314 09/10/15 2317   09/11/15 1530  piperacillin-tazobactam (ZOSYN) IVPB 3.375 g  Status:  Discontinued     3.375 g 12.5 mL/hr over 240 Minutes Intravenous 3 times per day 09/11/15 1515 09/16/15 1310   09/11/15 1445  linezolid (ZYVOX) IVPB 600 mg  Status:  Discontinued     600 mg 300 mL/hr over 60 Minutes Intravenous Every 12 hours 09/11/15 1435 09/16/15 1310   09/11/15 1000  linezolid (ZYVOX) tablet 600 mg  Status:  Discontinued     600 mg Oral Every 12 hours 09/10/15 2317 09/11/15 1435   09/10/15 2230  linezolid (ZYVOX) tablet 600 mg  Status:  Discontinued     600 mg Oral Every 12 hours 09/10/15 2216 09/10/15 2314   09/10/15 2200  linezolid (ZYVOX) tablet 600 mg  Status:  Discontinued     600 mg Oral Every 12 hours 09/10/15 1453 09/10/15 1502   09/10/15 1800  DAPTOmycin (CUBICIN) 200 mg in sodium chloride 0.9 % IVPB  Status:  Discontinued     200 mg 208 mL/hr over 30 Minutes Intravenous Every 24 hours 09/10/15 1502 09/10/15 1551   09/10/15 1445  vancomycin (VANCOCIN) 50 mg/mL oral solution 125 mg     125 mg Oral 4 times per day 09/10/15 1436     09/10/15  1445  linezolid (ZYVOX) tablet 600 mg  Status:  Discontinued     600 mg Oral Every 12 hours 09/10/15 1436 09/10/15 1453   09/09/15 2030  metroNIDAZOLE (FLAGYL) IVPB 500 mg  Status:  Discontinued     500 mg 100 mL/hr over 60 Minutes Intravenous Every 8 hours 09/09/15 2025 09/10/15 1436   09/09/15 1800  DAPTOmycin (CUBICIN) 200 mg in sodium chloride 0.9 % IVPB  Status:  Discontinued     200 mg 208 mL/hr over 30 Minutes Intravenous Every 24 hours 09/09/15 0921 09/10/15 1436   09/09/15 1400  meropenem (MERREM) 500 mg in sodium chloride 0.9 % 50 mL IVPB  Status:  Discontinued     500 mg 100 mL/hr over 30 Minutes  Intravenous 3 times per day 09/09/15 0922 09/09/15 1448   09/06/15 2200  meropenem (MERREM) 1 g in sodium chloride 0.9 % 100 mL IVPB  Status:  Discontinued     1 g 200 mL/hr over 30 Minutes Intravenous Every 12 hours 09/06/15 1850 09/09/15 0922   09/06/15 1900  DAPTOmycin (CUBICIN) 200 mg in sodium chloride 0.9 % IVPB  Status:  Discontinued     200 mg 208 mL/hr over 30 Minutes Intravenous Every 24 hours 09/06/15 1850 09/09/15 0921   09/06/15 1000  ertapenem (INVANZ) 1 g in sodium chloride 0.9 % 50 mL IVPB  Status:  Discontinued     1 g 100 mL/hr over 30 Minutes Intravenous Every 24 hours 09/04/15 2358 09/06/15 1714   09/05/15 1400  vancomycin (VANCOCIN) IVPB 750 mg/150 ml premix  Status:  Discontinued     750 mg 150 mL/hr over 60 Minutes Intravenous Every 18 hours 09/05/15 0249 09/06/15 1458   09/05/15 0300  ertapenem (INVANZ) 1 g in sodium chloride 0.9 % 50 mL IVPB     1 g 100 mL/hr over 30 Minutes Intravenous  Once 09/05/15 0253 09/05/15 0725   09/04/15 2200  piperacillin-tazobactam (ZOSYN) IVPB 3.375 g     3.375 g 100 mL/hr over 30 Minutes Intravenous  Once 09/04/15 2157 09/04/15 2243   09/04/15 2200  vancomycin (VANCOCIN) IVPB 1000 mg/200 mL premix     1,000 mg 200 mL/hr over 60 Minutes Intravenous  Once 09/04/15 2157 09/04/15 2312      Assessment/Plan:  Patient with ileus  following respiratory distress. NG tube is in place.  Doubt bowel obstruction however patient does have risk factors for bowel obstruction. Of note however I had met the patient last week and discussed with her daughters repair of a perirectal wound or a colostomy. The daughters were adamant that she should not undergo any sort of surgery. The need for ongoing surgical consultation is not clear as no surgery would be planned. I will follow the patient until nasogastric tube can be removed. Florene Glen, MD, FACS  09/18/2015

## 2015-09-18 NOTE — Care Management (Signed)
patient has remained extubated.  Pulled out nasogastric tube and it has been reinserted to LIS.  Being treated for ileus.  Discussed possible 02 needs at time of discharge if is able to discharge back home and discussed  if patient could benefit from long term  acute hospital.  Tube feedings remain on hold

## 2015-09-18 NOTE — Progress Notes (Signed)
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 S/p VDRF.  Course has been complicated by development of ileus and C. Difficile.     ASSESSMENT / PLAN:  A: PULMONARY  Respiratory failure-ventilator dependent-difficult wean ALS stage 4 Right lower lobe atelectasis/pneumonia; extubated 2/21 P:  Continue to monitor respiratory status, appears better at this time. We'll need to continue to monitor, in terms of whether the patient might require or benefit from home BiPAP.  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  -Avoid nephrotoxic drugs  A: GASTROINTESTINAL GERD Protein calorie malnutrition Dysphagia due to disease progression X-ray images of the abdomen reviewed today showed continued Ileus P -Continue PPI, senna-docusate; dulcolax suppository, miralax. We'll stop erythromycin, started on IV Reglan scheduled. Also given a dose today of neostigmine. -Hold TF -Continue free water flushes -NPO, NGT to LiS.  -Patient pulled out NG tube today, will replace.   A: INFECTIOUS DISEASE UTI-VRE Positive for C. Difficile antigen Sepsis 2/2 UTI P:  - Continue vancomycin, flagyl  -ID following.   A: ENDOCRINE Hypoglycemia, pt is NPO.  P -Continue D5, continue monitoring.   A: NEUROLOGIC ALS Seizure disorder Chronic anxiety P:  -continue  nuedexta at home dose. -Continue with antidepressants  -Continue her Keppra -Low dose precedex for mild agitation  -Continue  home dose of valium -Avoid over sedation, hold zanaflex.  -May require home bipap.     Cultures: BCx2  UC 2/10-VRE Sputum MRSA PCR negative C. Difficile-antigen positive Influenza panel negative 02/15:BAL>NTD  STUDIES:   None   SIGNIFICANT EVENTS: 2/15>> intubated, bronchoscopy BAL-Cultures negative todate -----------------------------------------------  SUBJECTIVE:  No acute events overnight, rectal tube was removed. The patient pulled out her NG tube. XRay 2/22 reviewed, showed continued ileus.   VITAL SIGNS: Temp:  [97.8 F (36.6 C)-99.1 F (37.3 C)] 98.1 F (36.7 C) (02/22 0757) Pulse Rate:  [81-107] 85 (02/22 0800) Resp:  [18-41] 22 (02/22 0800) BP: (109-153)/(57-96) 117/66 mmHg (02/22 0800) SpO2:  [90 %-100 %] 90 % (02/22 0800) FiO2 (%):  [30 %] 30 % (02/21 1100) Weight:  [128 lb 4.9 oz (58.2 kg)] 128 lb 4.9 oz (58.2 kg) (02/22 0545) HEMODYNAMICS:   VENTILATOR SETTINGS: Vent Mode:  [-]  FiO2 (%):  [30 %] 30 % INTAKE / OUTPUT:  Intake/Output Summary (Last 24 hours) at 09/18/15 0919 Last data filed at 09/18/15 0700  Gross per 24 hour  Intake 1414.17 ml  Output   1225 ml  Net 189.17 ml    Review of Systems  Unable to perform ROS: intubated    Physical Exam  Constitutional: No distress.  Chronically ill looking  HENT:  Head: Normocephalic and atraumatic.  Right Ear: External ear normal.  Left Ear: External ear normal.  ETT  and OGT  Eyes: Conjunctivae and EOM are normal. Pupils are equal, round, and reactive to light.  Neck: Normal range of motion. Neck supple. No thyromegaly present.  Cardiovascular: Normal rate, regular rhythm, normal heart sounds and intact distal pulses.   No murmur heard. Pulmonary/Chest: No respiratory distress. She has no wheezes. She has no rales.  Shallow breaths on PSV, Bilateral airflow with  diminished breathsounds at the bases  Abdominal: Soft. Bowel sounds are normal. She exhibits no distension.  Musculoskeletal: She exhibits no edema or tenderness.  Contractures in bilateral lower extremities   Neurological: She is alert.  Awake on the vent, following commands, moves upper extremities but responses are sluggist   Skin: Skin is warm and dry.  Nursing note and vitals reviewed.    LABS:  CBC  Recent Labs Lab 09/15/15 0502 09/16/15 0443 09/18/15 0455  WBC 15.8* 13.4* 8.6  HGB 8.0* 7.9* 8.7*  HCT 24.1* 24.3* 26.1*  PLT 366 365 494*   Coag's No results for input(s): APTT, INR in the last 168 hours. BMET  Recent Labs Lab 09/16/15 0443 09/17/15 0511 09/18/15 0455  NA 136 139 141  K 3.5 3.4* 3.3*  CL 109 111 112*  CO2 24 21* 24  BUN 13 8 <5*  CREATININE 0.39* 0.39* 0.32*  GLUCOSE 112* 71 92   Electrolytes  Recent Labs Lab 09/15/15 0502 09/16/15 0443 09/17/15 0511 09/18/15 0455  CALCIUM 7.2* 7.4* 7.7* 7.6*  MG 2.2 1.9 1.8 1.8  PHOS 2.3* 3.4 3.7  --    Sepsis Markers No results for input(s): LATICACIDVEN, PROCALCITON, O2SATVEN in the last 168 hours. ABG  Recent Labs Lab 09/14/15 0938 09/16/15 0500 09/17/15 1045  PHART 7.48* 7.45 7.44  PCO2ART 27* 29* 29*  PO2ART 112* 108 101   Liver Enzymes  Recent Labs Lab 09/12/15 0343  ALBUMIN 1.7*   Cardiac Enzymes No results for input(s): TROPONINI, PROBNP in the last 168 hours. Glucose  Recent Labs Lab 09/17/15 1947 09/18/15 0005 09/18/15 0048 09/18/15 0408 09/18/15 0731 09/18/15 0859  GLUCAP 77 55* 77 90 68 121*    Imaging Dg Abd 1 View  09/18/2015  CLINICAL DATA:  Follow-up ileus.  Subsequent encounter. EXAM: ABDOMEN - 1 VIEW COMPARISON:  Abdominal radiograph performed 09/17/2015 FINDINGS: The patient's enteric tube is noted ending overlying the antrum of the stomach. There is persistent diffuse distention of small and large bowel loops, perhaps slightly improved from the prior study, compatible with ileus. Scattered high-density foci are noted about the lower abdomen. No free intra-abdominal air is seen, though evaluation for free air is noted on a single supine view. No acute osseous  abnormalities are identified. IMPRESSION: 1. Enteric tube noted ending overlying the antrum of the stomach. 2. Diffuse distention of small and large bowel loops is perhaps slightly improved, though still compatible with ileus. No free intra-abdominal air seen. Electronically Signed   By: Roanna Raider M.D.   On: 09/18/2015 02:58   Dg Abd 1 View  09/17/2015  CLINICAL DATA:  55 year old female status post NG tube placement. Initial encounter. EXAM: ABDOMEN - 1 VIEW COMPARISON:  0547 hours today and earlier. FINDINGS: KUB view of the abdomen at 1333 hours. Worsening gaseous distension of mid in left abdominal bowel loops since 09/13/2015. The pelvis is not included on this image. Increase gray hazy opacity in the abdomen over the recent series of exams raises the possibility of ascites. Enteric tube is in placed with side hole at the level of the stomach. Abnormal lung bases appear stable since 09/16/2015. IMPRESSION: 1. Abnormal bowel gas pattern, worsening since 09/13/2015, suspicious for Acute Bowel Obstruction and Ascites. CT Abdomen and Pelvis (IV and oral contrast preferred) would evaluate further. 2. NG tube tip and side hole at the level of the stomach. Electronically Signed   By: Odessa Fleming M.D.   On: 09/17/2015 14:08      Deep  Nicholos Johns, M.D.   Critical Care Attestation.  I have personally obtained a history, examined the patient, evaluated laboratory and imaging results, formulated the assessment and plan and placed orders. The Patient requires high complexity decision making for assessment and support, frequent evaluation and titration of therapies, application of advanced monitoring technologies and extensive interpretation of multiple databases. The patient has critical illness that could lead imminently to failure of 1 or more organ systems and requires the highest level of physician preparedness to intervene.  Critical Care Time devoted to patient care services described in this note is 35  minutes and is exclusive of time spent in procedures.

## 2015-09-18 NOTE — Progress Notes (Signed)
Per pts husband, pt has been off Wellbutrin since admission. This was reported to [place], Charity fundraiser.

## 2015-09-18 NOTE — Progress Notes (Signed)
Pt HR decreased into 30, pt given ordered MD, Pharmacy called for consult to counter-act med, 0.6mg  Atropine given per pharmacy consult, HR 70s, pt resting comfortably

## 2015-09-18 NOTE — Progress Notes (Signed)
Nutrition Follow-up  DOCUMENTATION CODES:   Severe malnutrition in context of chronic illness  INTERVENTION:   Coordination of Care: discussed nutritional poc during ICU rounds, TF on hold for several days, no oral diet at present. Pt with persistent ileus, also with severe malnutrition on admission, hx of ALS and dysphagia. Discussed possibility of initiation of TPN at this time with MD Nicholos Johns during ICU rounds; MD did not want to initiate TPN at this time, will continue to assess  NUTRITION DIAGNOSIS:   Inadequate oral intake related to acute illness as evidenced by NPO status.   GOAL:   Patient will meet greater than or equal to 90% of their needs   MONITOR:    (Energy intake)  REASON FOR ASSESSMENT:   Consult Enteral/tube feeding initiation and management  ASSESSMENT:    Pt s/p extubation yesterday, currently on 2L [place], on visit this AM, NG out and lying in pt bed, Mardene Celeste RN made aware, abdominal xray with persistent ileus, plan for NG to be replaced for decompression  Diet Order:   NPO  Skin:   (stage I pressure ulcer)   Recent Labs Lab 09/15/15 0502 09/16/15 0443 09/17/15 0511 09/18/15 0455  NA 138 136 139 141  K 3.1* 3.5 3.4* 3.3*  CL 110 109 111 112*  CO2 22 24 21* 24  BUN <5*  CREATININE 0.31* 0.39* 0.39* 0.32*  CALCIUM 7.2* 7.4* 7.7* 7.6*  MG 2.2 1.9 1.8 1.8  PHOS 2.3* 3.4 3.7  --   GLUCOSE 115* 112* 71 92    Glucose Profile:  Recent Labs  09/18/15 0731 09/18/15 0859 09/18/15 1228  GLUCAP 68 121* 101*   Meds: reviewed  Height:   Ht Readings from Last 1 Encounters:  09/11/15  (1.575 m)    Weight:   Wt Readings from Last 1 Encounters:  09/18/15 128 lb 4.9 oz (58.2 kg)    Filed Weights   09/16/15 0532 09/17/15 0359 09/18/15 0545  Weight: 122 lb 5.7 oz (55.5 kg) 126 lb 8.7 oz (57.4 kg) 128 lb 4.9 oz (58.2 kg)    BMI:  Body mass index is 23.46 kg/(m^2).  Estimated Nutritional Needs:   Kcal:  1123kcals, (BEE:  1011kcals, Ve: 6, Tmax: 37) using [employer] Equation and current weight of 45.8kg  Protein:  69-92g protein (1.5-2.0g/kg)  Fluid:  (25-87ml//kg) 900-1091ml/d  EDUCATION NEEDS:   No education needs identified at this time  HIGH Care Level  [facility] MS, RD, LDN [phone] Pager  [phone] Weekend/On-Call Pager

## 2015-09-18 NOTE — Progress Notes (Signed)
[facility] ADULT ICU REPLACEMENT PROTOCOL FOR AM LAB REPLACEMENT ONLY  The patient does apply for the [facility] Adult ICU Electrolyte Replacment Protocol based on the criteria listed below:   1. Is GFR >/= 40 ml/min? Yes.    Patient's GFR today is >60 2. Is urine output >/= 0.5 ml/kg/hr for the last 6 hours? Yes.   Patient's UOP is 2.2 ml/kg/hr 3. Is BUN < 60 mg/dL? Yes.    Patient's BUN today is <5 4. Abnormal electrolyte(s): K3.3, Mg1.8 5. Ordered repletion with: per protocol 6. If a panic level lab has been reported, has the CCM MD in charge been notified? Yes.  .   Physician:  Holland Commons, MD  Melrose Nakayama 09/18/2015 6:18 AM

## 2015-09-18 NOTE — Progress Notes (Signed)
eLink Physician-Brief Progress Note Patient Name: Jennifer [name] DOB: Feb 15, 1961 MRN: [id number]   Date of Service  09/18/2015  HPI/Events of Note  Call from nurse reporting that pain has continued to have abd pain.  Camera check shows patient in pain.  HD stable.  Has NGT to suction concern for ileus.  eICU Interventions  Plan: One time dose of GI cocktail One time dose of fentanyl 50 mcg IV     Intervention Category Intermediate Interventions: Pain - evaluation and management  Marlis Oldaker 09/18/2015, 1:45 AM

## 2015-09-18 NOTE — Progress Notes (Signed)
Patient ID: Jennifer [name], female   DOB: 09/07/1960, 56 y.o.   MRN: [id number] [facility] PROGRESS NOTE  Jennifer [name] WUJ:[id number] DOB: [birth year] DOA: 09/04/2015 PCP: Ruthe Mannan, MD  HPI/Subjective: Patient unable to talk with me. The shake her head to yes or no questions. Shakes her head yes to abdominal pain.  Objective: Filed Vitals:   09/18/15 1200 09/18/15 1300  BP: 93/70 115/69  Pulse:    Temp:    Resp: 25 17    Filed Weights   09/16/15 0532 09/17/15 0359 09/18/15 0545  Weight: 55.5 kg (122 lb 5.7 oz) 57.4 kg (126 lb 8.7 oz) 58.2 kg (128 lb 4.9 oz)    ROS: Review of Systems  Respiratory: Positive for shortness of breath. Negative for cough.   Cardiovascular: Negative for chest pain.  Gastrointestinal: Positive for abdominal pain. Negative for nausea, vomiting, diarrhea and constipation.   Limited because she is not talking with me. Exam: Physical Exam  Constitutional: She is oriented to person, place, and time.  HENT:  Nose: No mucosal edema.  Mouth/Throat: No oropharyngeal exudate or posterior oropharyngeal edema.  Eyes: Conjunctivae, EOM and lids are normal. Pupils are equal, round, and reactive to light.  Neck: No JVD present. Carotid bruit is not present. No edema present. No thyroid mass and no thyromegaly present.  Cardiovascular: S1 normal and S2 normal.  Exam reveals no gallop.   No murmur heard. Pulses:      Dorsalis pedis pulses are 2+ on the right side, and 2+ on the left side.  Respiratory: Accessory muscle usage present. No respiratory distress. She has no wheezes. She has rhonchi in the right lower field and the left lower field. She has no rales.  GI: Soft. Bowel sounds are normal. There is no tenderness.  Musculoskeletal:       Right ankle: She exhibits swelling.       Left ankle: She exhibits swelling.  Lymphadenopathy:    She has no cervical adenopathy.  Neurological: She is alert and oriented to person, place, and time. No  cranial nerve deficit.  Skin: Skin is warm. No rash noted. Nails show no clubbing.  Psychiatric: She has a normal mood and affect.      Data Reviewed: Basic Metabolic Panel:  Recent Labs Lab 09/12/15 0343  09/14/15 0500 09/15/15 0502 09/16/15 0443 09/17/15 0511 09/18/15 0455  NA 140  < > 139 138 136 139 141  K 4.2  < > 3.4* 3.1* 3.5 3.4* 3.3*  CL 113*  < > 111 110 109 111 112*  CO2 22  < > 21* 24  GLUCOSE 118*  < > 129* 115* 112* 71 92  BUN 11  < > <5*  CREATININE 0.38*  < > 0.32* 0.31* 0.39* 0.39* 0.32*  CALCIUM 7.5*  < > 7.2* 7.2* 7.4* 7.7* 7.6*  MG 2.0  --  1.6* 2.2 1.9 1.8 1.8  PHOS 2.6  --  2.7 2.3* 3.4 3.7  --   < > = values in this interval not displayed. Liver Function Tests:  Recent Labs Lab 09/12/15 0343  ALBUMIN 1.7*   CBC:  Recent Labs Lab 09/12/15 0343 09/13/15 0505 09/15/15 0502 09/16/15 0443 09/18/15 0455  WBC 26.7* 17.1* 15.8* 13.4* 8.6  HGB 9.7* 9.4* 8.0* 7.9* 8.7*  HCT 30.3* 28.8* 24.1* 24.3* 26.1*  MCV 95.2 94.3 93.9 93.2 93.2  PLT 433 440 366 365 494*    CBG:  Recent Labs Lab 09/18/15  1610 09/18/15 0408 09/18/15 0731 09/18/15 0859 09/18/15 1228  GLUCAP 77 90 68 121* 101*    Recent Results (from the past 240 hour(s))  C difficile quick scan w PCR reflex     Status: Abnormal   Collection Time: 09/09/15  4:14 PM  Result Value Ref Range Status   C Diff antigen POSITIVE (A) NEGATIVE Final   C Diff toxin NEGATIVE NEGATIVE Final   C Diff interpretation   Final    Positive for toxigenic C. difficile, active toxin production not detected. Patient has toxigenic C. difficile organisms present in the bowel, but toxin was not detected. The patient may be a carrier or the level of toxin in the sample was below the limit  of detection. This information should be used in conjunction with the patient's clinical history when deciding on possible therapy.     Comment: CRITICAL RESULT CALLED TO, READ BACK BY AND VERIFIED  WITH: Burnett Corrente ON 09/09/15 AT 2019 BY TLB   Clostridium Difficile by PCR     Status: Abnormal   Collection Time: 09/09/15  4:14 PM  Result Value Ref Range Status   Toxigenic C Difficile by pcr POSITIVE (A) NEGATIVE Final    Comment: CRITICAL RESULT CALLED TO, READ BACK BY AND VERIFIED WITH: Burnett Corrente ON 09/09/15 AT 2019 BY TLB   MRSA PCR Screening     Status: None   Collection Time: 09/11/15  6:52 AM  Result Value Ref Range Status   MRSA by PCR NEGATIVE NEGATIVE Final    Comment:        The GeneXpert MRSA Assay (FDA approved for NASAL specimens only), is one component of a comprehensive MRSA colonization surveillance program. It is not intended to diagnose MRSA infection nor to guide or monitor treatment for MRSA infections.   Culture, bal-quantitative     Status: None   Collection Time: 09/11/15  2:21 PM  Result Value Ref Range Status   Specimen Description BRONCHIAL ALVEOLAR LAVAGE  Final   Special Requests Normal BRONCHIAL BRUSHING  Final   Gram Stain   Final    FEW WBC SEEN RARE SQUAMOUS EPITHELIAL CELLS PRESENT NO ORGANISMS SEEN    Culture Consistent with normal respiratory flora.  Final   Report Status 09/13/2015 FINAL  Final  Fungus Culture with Smear     Status: None (Preliminary result)   Collection Time: 09/11/15  2:21 PM  Result Value Ref Range Status   Specimen Description BRONCHIAL BRUSHING  Final   Special Requests Normal  Final   Culture CANDIDA GLABRATA  Final   Report Status PENDING  Incomplete     Studies: Dg Abd 1 View  09/18/2015  CLINICAL DATA:  Follow-up ileus.  Subsequent encounter. EXAM: ABDOMEN - 1 VIEW COMPARISON:  Abdominal radiograph performed 09/17/2015 FINDINGS: The patient's enteric tube is noted ending overlying the antrum of the stomach. There is persistent diffuse distention of small and large bowel loops, perhaps slightly improved from the prior study, compatible with ileus. Scattered high-density foci are noted about the  lower abdomen. No free intra-abdominal air is seen, though evaluation for free air is noted on a single supine view. No acute osseous abnormalities are identified. IMPRESSION: 1. Enteric tube noted ending overlying the antrum of the stomach. 2. Diffuse distention of small and large bowel loops is perhaps slightly improved, though still compatible with ileus. No free intra-abdominal air seen. Electronically Signed   By: Roanna Raider M.D.   On: 09/18/2015 02:58   Dg Abd  1 View  09/17/2015  CLINICAL DATA:  55 year old female status post NG tube placement. Initial encounter. EXAM: ABDOMEN - 1 VIEW COMPARISON:  0547 hours today and earlier. FINDINGS: KUB view of the abdomen at 1333 hours. Worsening gaseous distension of mid in left abdominal bowel loops since 09/13/2015. The pelvis is not included on this image. Increase gray hazy opacity in the abdomen over the recent series of exams raises the possibility of ascites. Enteric tube is in placed with side hole at the level of the stomach. Abnormal lung bases appear stable since 09/16/2015. IMPRESSION: 1. Abnormal bowel gas pattern, worsening since 09/13/2015, suspicious for Acute Bowel Obstruction and Ascites. CT Abdomen and Pelvis (IV and oral contrast preferred) would evaluate further. 2. NG tube tip and side hole at the level of the stomach. Electronically Signed   By: Odessa Fleming M.D.   On: 09/17/2015 14:08   Dg Abd 1 View  09/17/2015  CLINICAL DATA:  Ileus. EXAM: ABDOMEN - 1 VIEW COMPARISON:  09/16/2015. FINDINGS: NG tube in stable position. Persistent distended loops of small and large bowel present. Air is noted within the rectum. Findings consistent adynamic ileus distal colonic partial obstruction cannot be excluded. Residual barium noted in the colon. No pneumatosis. No free air. Overall increased density of the abdomen noted. Ascites cannot be excluded. No acute bony abnormality. IMPRESSION: 1.  NG tube in stable position. 2. Persistent distention of small  and large bowel suggesting adynamic ileus. Air is noted in the rectum. A partial colonic obstruction cannot be excluded. No interim improvement. 3. Overall increased density of the abdomen noted. Ascites cannot be excluded. Electronically Signed   By: Maisie Fus  Register   On: 09/17/2015 07:19   Dg Abd Portable 1v  09/18/2015  CLINICAL DATA:  Nasogastric tube placement EXAM: PORTABLE ABDOMEN - 1 VIEW COMPARISON:  September 18, 2015 FINDINGS: Nasogastric tube tip and side port are in the stomach. Bowel dilatation remains without appreciable change from earlier in the day. No free air is evident on this supine examination. IMPRESSION: Nasogastric tube tip and side port in stomach. Generalized bowel dilatation remains. Suspect ileus, although a degree of obstruction cannot be excluded. Electronically Signed   By: Bretta Bang III M.D.   On: 09/18/2015 12:17    Scheduled Meds: . antiseptic oral rinse  7 mL Mouth Rinse QID  . bisacodyl  10 mg Rectal Daily  . buPROPion  100 mg Oral BID  . Dextromethorphan-Quinidine  1 capsule Oral BID  . docusate  100 mg Oral BID  . enoxaparin (LOVENOX) injection  40 mg Subcutaneous Q24H  . famotidine  20 mg Oral BID  . lactulose  10 g Oral BID  . levETIRAcetam  500 mg Intravenous Q12H  . metoCLOPramide (REGLAN) injection  10 mg Intravenous 3 times per day  . metronidazole  500 mg Intravenous Q8H  . neostigmine  0.5 mg Intravenous Once  . phenylephrine  1 suppository Rectal BID  . polyethylene glycol  17 g Oral BID  . sennosides  10 mL Oral BID  . sodium chloride  1,000 mL Intravenous Once  . vancomycin  125 mg Oral 4 times per day  . vancomycin (VANCOCIN) rectal ENEMA  500 mg Rectal 4 times per day   Continuous Infusions: . dextrose 5 % and 0.9% NaCl 75 mL/hr at 09/18/15 1300    Assessment/Plan:  1. Acute hypoxic respiratory failure, aspiration pneumonia and sepsis, ALS.  The patient was intubated 09/11/15 and extubated 09/17/2015. The patient is using  accessory muscles. Continue to monitor respiratory status closely. 2. Acute metabolic encephalopathy secondary to sepsis. VRE UTI,  Aspiration pneumonia and finished antibiotics for this. Clostridium difficile positive on oral vancomycin and Flagyl 3. Ileus- NG tube feeding on hold 4. Hypokalemia replaced via tube 5. Seizure disorder on Keppra 6. ALS- bedbound.  7. Dysphagia 8. Severe malnutrition 9. Hypoglycemia- on D5NS  Code Status:     Code Status Orders        Start     Ordered   09/04/15 2348  Full code   Continuous     09/04/15 2348    Code Status History    Date Active Date Inactive Code Status Order ID Comments User Context   06/26/2015  7:02 PM 06/29/2015  7:16 PM Partial Code [id number]  Milagros Loll, MD ED   01/04/2015  7:34 PM 01/06/2015  7:24 PM Full Code [id number]  Ramonita Lab, MD Inpatient   12/31/2014 11:17 PM 01/03/2015  9:35 PM Full Code [id number]  Shaune Pollack, MD Inpatient   09/15/2014 12:58 PM 09/16/2014  3:59 PM Full Code [id number]  Suzi Roots, MD ED     Disposition Plan: To be determined  Consultants:  Critical care specialist  Gen. Surgery  Infectious disease  Antibiotics:  Oral vancomycin  Oral Flagyl  Time spent: 20 minutes  Alford Highland  [facility]

## 2015-09-19 ENCOUNTER — Inpatient Hospital Stay: Payer: BLUE CROSS/BLUE SHIELD

## 2015-09-19 DIAGNOSIS — K567 Ileus, unspecified: Secondary | ICD-10-CM

## 2015-09-19 LAB — BASIC METABOLIC PANEL
ANION GAP: 3 — AB (ref 5–15)
CO2: 24 mmol/L (ref 22–32)
Calcium: 7.3 mg/dL — ABNORMAL LOW (ref 8.9–10.3)
Chloride: 114 mmol/L — ABNORMAL HIGH (ref 101–111)
Creatinine, Ser: 0.39 mg/dL — ABNORMAL LOW (ref 0.44–1.00)
Glucose, Bld: 178 mg/dL — ABNORMAL HIGH (ref 65–99)
POTASSIUM: 2.8 mmol/L — AB (ref 3.5–5.1)
SODIUM: 141 mmol/L (ref 135–145)

## 2015-09-19 LAB — BLOOD GAS, ARTERIAL
Acid-base deficit: 0.1 mmol/L (ref 0.0–2.0)
Allens test (pass/fail): POSITIVE — AB
Bicarbonate: 22.3 mEq/L (ref 21.0–28.0)
FIO2: 0.28
O2 Saturation: 98.6 %
Patient temperature: 37
pCO2 arterial: 28 mmHg — ABNORMAL LOW (ref 32.0–48.0)
pH, Arterial: 7.51 — ABNORMAL HIGH (ref 7.350–7.450)
pO2, Arterial: 107 mmHg (ref 83.0–108.0)

## 2015-09-19 LAB — GLUCOSE, CAPILLARY
GLUCOSE-CAPILLARY: 103 mg/dL — AB (ref 65–99)
GLUCOSE-CAPILLARY: 115 mg/dL — AB (ref 65–99)
GLUCOSE-CAPILLARY: 117 mg/dL — AB (ref 65–99)
GLUCOSE-CAPILLARY: 121 mg/dL — AB (ref 65–99)
GLUCOSE-CAPILLARY: 96 mg/dL (ref 65–99)
Glucose-Capillary: 65 mg/dL (ref 65–99)
Glucose-Capillary: 122 mg/dL — ABNORMAL HIGH (ref 65–99)

## 2015-09-19 LAB — MAGNESIUM: MAGNESIUM: 1.9 mg/dL (ref 1.7–2.4)

## 2015-09-19 MED ORDER — VITAL 1.5 CAL PO LIQD
1000.0000 mL | ORAL | Status: DC
  Administered 2015-09-19: 1000 mL

## 2015-09-19 MED ORDER — POTASSIUM CHLORIDE 20 MEQ PO PACK
40.0000 meq | PACK | Freq: Three times a day (TID) | ORAL | Status: AC
  Administered 2015-09-19 (×3): 40 meq via ORAL
  Filled 2015-09-19 (×3): qty 2

## 2015-09-19 MED ORDER — DEXTROSE 50 % IV SOLN
1.0000 | Freq: Once | INTRAVENOUS | Status: AC
Start: 2015-09-19 — End: 2015-09-19
  Administered 2015-09-19: 50 mL via INTRAVENOUS
  Filled 2015-09-19: qty 50

## 2015-09-19 MED ORDER — MAGNESIUM SULFATE 2 GM/50ML IV SOLN
2.0000 g | Freq: Once | INTRAVENOUS | Status: AC
  Administered 2015-09-19: 2 g via INTRAVENOUS
  Filled 2015-09-19: qty 50

## 2015-09-19 MED ORDER — MAGNESIUM SULFATE 2 GM/50ML IV SOLN
2.0000 g | Freq: Once | INTRAVENOUS | Status: DC
  Filled 2015-09-19: qty 50

## 2015-09-19 MED ORDER — FREE WATER
200.0000 mL | Freq: Three times a day (TID) | Status: DC
  Administered 2015-09-19 – 2015-09-23 (×12): 200 mL

## 2015-09-19 NOTE — Progress Notes (Signed)
[facility] CLINIC INFECTIOUS DISEASE PROGRESS NOTE Date of Admission:  09/04/2015     ID: Jennifer [name] is a 55 y.o. female with recurrent UTI, C diff, resp failure Active Problems:   UTI (lower urinary tract infection)   Pressure ulcer   Protein-calorie malnutrition, severe   Hemorrhoid prolapse   Hypoxia   Sepsis (HCC)   Acute respiratory failure (HCC)   Breathing difficult   Respiratory failure requiring intubation (HCC)   Ileus (HCC)  Subjective: Extubated Moving bowels now, xray with improving ileus  ROS  Unable to obtain  Medications:  Antibiotics Given (last 72 hours)    Date/Time Action Medication Dose Rate   09/16/15 1600 Given   metroNIDAZOLE (FLAGYL) IVPB 500 mg 500 mg 100 mL/hr   09/16/15 1828 Given   vancomycin (VANCOCIN) 50 mg/mL oral solution 125 mg 125 mg    09/16/15 1828 Given   vancomycin (VANCOCIN) 500 mg in sodium chloride irrigation 0.9 % 100 mL ENEMA 500 mg    09/16/15 1828 Given   erythromycin ethylsuccinate (EES) 200 MG/5ML suspension 200 mg 200 mg    09/16/15 2157 Given   erythromycin ethylsuccinate (EES) 200 MG/5ML suspension 200 mg 200 mg    09/17/15 0041 Given   vancomycin (VANCOCIN) 50 mg/mL oral solution 125 mg 125 mg    09/17/15 0041 Given   metroNIDAZOLE (FLAGYL) IVPB 500 mg 500 mg 100 mL/hr   09/17/15 0515 Given   vancomycin (VANCOCIN) 50 mg/mL oral solution 125 mg 125 mg    09/17/15 0515 Given   erythromycin ethylsuccinate (EES) 200 MG/5ML suspension 200 mg 200 mg    09/17/15 0858 Given   metroNIDAZOLE (FLAGYL) IVPB 500 mg 500 mg 100 mL/hr   09/17/15 1429 Given   vancomycin (VANCOCIN) 50 mg/mL oral solution 125 mg 125 mg    09/17/15 1429 Given   vancomycin (VANCOCIN) 500 mg in sodium chloride irrigation 0.9 % 100 mL ENEMA 500 mg    09/17/15 1429 Given   erythromycin ethylsuccinate (EES) 200 MG/5ML suspension 200 mg 200 mg    09/17/15 1605 Given   metroNIDAZOLE (FLAGYL) IVPB 500 mg 500 mg 100 mL/hr   09/17/15 1659 Given   vancomycin  (VANCOCIN) 50 mg/mL oral solution 125 mg 125 mg    09/17/15 2326 Given   erythromycin ethylsuccinate (EES) 200 MG/5ML suspension 200 mg 200 mg    09/17/15 2327 Given   metroNIDAZOLE (FLAGYL) IVPB 500 mg 500 mg 100 mL/hr   09/17/15 2327 Given   vancomycin (VANCOCIN) 500 mg in sodium chloride irrigation 0.9 % 100 mL ENEMA 500 mg    09/17/15 2328 Given   vancomycin (VANCOCIN) 50 mg/mL oral solution 125 mg 125 mg    09/18/15 0507 Given   vancomycin (VANCOCIN) 50 mg/mL oral solution 125 mg 125 mg    09/18/15 0507 Given   erythromycin ethylsuccinate (EES) 200 MG/5ML suspension 200 mg 200 mg    09/18/15 2841 Given   vancomycin (VANCOCIN) 500 mg in sodium chloride irrigation 0.9 % 100 mL ENEMA 500 mg    09/18/15 0900 Given   metroNIDAZOLE (FLAGYL) IVPB 500 mg 500 mg 100 mL/hr   09/18/15 1259 Given   vancomycin (VANCOCIN) 50 mg/mL oral solution 125 mg 125 mg    09/18/15 1533 Given   metroNIDAZOLE (FLAGYL) IVPB 500 mg 500 mg 100 mL/hr   09/18/15 1833 Given   vancomycin (VANCOCIN) 50 mg/mL oral solution 125 mg 125 mg    09/18/15 2351 Given   metroNIDAZOLE (FLAGYL) IVPB 500 mg 500  mg 100 mL/hr   09/19/15 0006 Given   vancomycin (VANCOCIN) 500 mg in sodium chloride irrigation 0.9 % 100 mL ENEMA 500 mg    09/19/15 0019 Given   vancomycin (VANCOCIN) 50 mg/mL oral solution 125 mg 125 mg    09/19/15 0534 Given   vancomycin (VANCOCIN) 50 mg/mL oral solution 125 mg 125 mg    09/19/15 0534 Given   vancomycin (VANCOCIN) 500 mg in sodium chloride irrigation 0.9 % 100 mL ENEMA 500 mg    09/19/15 0826 Given   metroNIDAZOLE (FLAGYL) IVPB 500 mg 500 mg 100 mL/hr   09/19/15 1310 Given   vancomycin (VANCOCIN) 50 mg/mL oral solution 125 mg 125 mg    09/19/15 1311 Given   vancomycin (VANCOCIN) 500 mg in sodium chloride irrigation 0.9 % 100 mL ENEMA 500 mg      . antiseptic oral rinse  7 mL Mouth Rinse QID  . bisacodyl  10 mg Rectal Daily  . buPROPion  100 mg Oral BID  . Dextromethorphan-Quinidine  1  capsule Oral BID  . docusate  100 mg Oral BID  . enoxaparin (LOVENOX) injection  40 mg Subcutaneous Q24H  . famotidine  20 mg Oral BID  . lactulose  10 g Oral BID  . levETIRAcetam  500 mg Intravenous Q12H  . magnesium sulfate 1 - 4 g bolus IVPB  2 g Intravenous Once  . metoCLOPramide (REGLAN) injection  10 mg Intravenous 3 times per day  . metronidazole  500 mg Intravenous Q8H  . phenylephrine  1 suppository Rectal BID  . polyethylene glycol  17 g Oral BID  . potassium chloride  40 mEq Oral TID  . sennosides  10 mL Oral BID  . sodium chloride  1,000 mL Intravenous Once  . vancomycin  125 mg Oral 4 times per day  . vancomycin (VANCOCIN) rectal ENEMA  500 mg Rectal 4 times per day    Objective: Vital signs in last 24 hours: Temp:  [97.4 F (36.3 C)-98.7 F (37.1 C)] 97.4 F (36.3 C) (02/23 0800) Pulse Rate:  [65-195] 91 (02/23 0900) Resp:  [8-30] 14 (02/23 1100) BP: (96-143)/(55-87) 113/66 mmHg (02/23 1100) SpO2:  [64 %-100 %] 97 % (02/23 0900) Weight:  [55.7 kg (122 lb 12.7 oz)] 55.7 kg (122 lb 12.7 oz) (02/23 0435) GENERAL: chronically ill appearing  awake EYES: Pupils equal, round, reactive to light and accommodation. No scleral icterus.  HEENT: ETT NECK: Supple, no jugular venous distention. No thyroid enlargement, no tenderness.  LUNGS:Rhonchi   CARDIOVASCULAR: S1, S2 normal.  ABDOMEN: Soft, nontender, somewhat distended . Bowel sounds quiet . No organomegaly or mass.  EXTREMITIES: 1+ edema to thigh NEUROLOGIC: Slumped to the right side. Does not follow commands. PSYCHIATRIC: The patient is sedated  SKIN: No obvious rash, lesion, or ulcer. Foley in place with concentrated urine   Lab Results  Recent Labs  09/18/15 0455 09/19/15 0424  WBC 8.6  --   HGB 8.7*  --   HCT 26.1*  --   NA 141 141  K 3.3* 2.8*  CL 112* 114*  CO2 24 24  BUN <5* <5*  CREATININE 0.32* 0.39*    Microbiology: Results for orders placed or performed during the hospital  encounter of 09/04/15  Urine culture     Status: None   Collection Time: 09/04/15  9:01 PM  Result Value Ref Range Status   Specimen Description URINE, RANDOM  Final   Special Requests NONE  Final   Culture MULTIPLE SPECIES  PRESENT, SUGGEST RECOLLECTION  Final   Report Status 09/06/2015 FINAL  Final  Blood Culture (routine x 2)     Status: None   Collection Time: 09/04/15  9:41 PM  Result Value Ref Range Status   Specimen Description BLOOD RIGHT ARM  Final   Special Requests BOTTLES DRAWN AEROBIC AND ANAEROBIC  Final   Culture NO GROWTH 5 DAYS  Final   Report Status 09/09/2015 FINAL  Final  Blood Culture (routine x 2)     Status: None   Collection Time: 09/04/15  9:41 PM  Result Value Ref Range Status   Specimen Description BLOOD LEFT ARM  Final   Special Requests BOTTLES DRAWN AEROBIC AND ANAEROBIC  Final   Culture NO GROWTH 5 DAYS  Final   Report Status 09/09/2015 FINAL  Final  Urine culture     Status: None   Collection Time: 09/06/15  4:58 PM  Result Value Ref Range Status   Specimen Description URINE, CATHETERIZED  Final   Special Requests ertapenem and vanco Normal  Final   Culture   Final    >=100,000 COLONIES/mL VANCOMYCIN RESISTANT ENTEROCOCCUS ENTEROCOCCUS FAECIUM VRE HAVE INTRINSIC RESISTANCE TO MOST COMMONLY USED ANTIBIOTICS AND THE ABILITY TO ACQUIRE RESISTANCE TO MOST AVAILABLE ANTIBIOTICS. WITH MIXED BACTERIAL ORGANISMS    Report Status 09/10/2015 FINAL  Final   Organism ID, Bacteria VANCOMYCIN RESISTANT ENTEROCOCCUS  Final      Susceptibility   Vancomycin resistant enterococcus - MIC*    AMPICILLIN >=32 RESISTANT Resistant     LEVOFLOXACIN >=8 RESISTANT Resistant     NITROFURANTOIN 128 RESISTANT Resistant     VANCOMYCIN >=32 RESISTANT Resistant     LINEZOLID 2 SENSITIVE Sensitive     * >=100,000 COLONIES/mL VANCOMYCIN RESISTANT ENTEROCOCCUS  C difficile quick scan w PCR reflex     Status: Abnormal   Collection Time: 09/09/15  4:14 PM  Result Value  Ref Range Status   C Diff antigen POSITIVE (A) NEGATIVE Final   C Diff toxin NEGATIVE NEGATIVE Final   C Diff interpretation   Final    Positive for toxigenic C. difficile, active toxin production not detected. Patient has toxigenic C. difficile organisms present in the bowel, but toxin was not detected. The patient may be a carrier or the level of toxin in the sample was below the limit  of detection. This information should be used in conjunction with the patient's clinical history when deciding on possible therapy.     Comment: CRITICAL RESULT CALLED TO, READ BACK BY AND VERIFIED WITH: Burnett Corrente ON 09/09/15 AT 2019 BY TLB   Clostridium Difficile by PCR     Status: Abnormal   Collection Time: 09/09/15  4:14 PM  Result Value Ref Range Status   Toxigenic C Difficile by pcr POSITIVE (A) NEGATIVE Final    Comment: CRITICAL RESULT CALLED TO, READ BACK BY AND VERIFIED WITH: Burnett Corrente ON 09/09/15 AT 2019 BY TLB   MRSA PCR Screening     Status: None   Collection Time: 09/11/15  6:52 AM  Result Value Ref Range Status   MRSA by PCR NEGATIVE NEGATIVE Final    Comment:        The GeneXpert MRSA Assay (FDA approved for NASAL specimens only), is one component of a comprehensive MRSA colonization surveillance program. It is not intended to diagnose MRSA infection nor to guide or monitor treatment for MRSA infections.   Culture, bal-quantitative     Status: None   Collection Time: 09/11/15  2:21 PM  Result Value Ref Range Status   Specimen Description BRONCHIAL ALVEOLAR LAVAGE  Final   Special Requests Normal BRONCHIAL BRUSHING  Final   Gram Stain   Final    FEW WBC SEEN RARE SQUAMOUS EPITHELIAL CELLS PRESENT NO ORGANISMS SEEN    Culture Consistent with normal respiratory flora.  Final   Report Status 09/13/2015 FINAL  Final  Fungus Culture with Smear     Status: None (Preliminary result)   Collection Time: 09/11/15  2:21 PM  Result Value Ref Range Status   Specimen  Description BRONCHIAL BRUSHING  Final   Special Requests Normal  Final   Culture CANDIDA GLABRATA  Final   Report Status PENDING  Incomplete    Studies/Results: Dg Abd 1 View  09/19/2015  CLINICAL DATA:  Ileus EXAM: ABDOMEN - 1 VIEW COMPARISON:  09/18/2015 FINDINGS: Improved bowel gas pattern. Decrease in bowel gas in the large and small bowel compared with the prior study. Minimal air in the rectum. NG tube in the stomach. IMPRESSION: Decreased large and small bowel gas and distention compared with prior study. Improving ileus. Electronically Signed   By: Marlan Palau M.D.   On: 09/19/2015 07:08   Dg Abd 1 View  09/18/2015  CLINICAL DATA:  Follow-up ileus.  Subsequent encounter. EXAM: ABDOMEN - 1 VIEW COMPARISON:  Abdominal radiograph performed 09/17/2015 FINDINGS: The patient's enteric tube is noted ending overlying the antrum of the stomach. There is persistent diffuse distention of small and large bowel loops, perhaps slightly improved from the prior study, compatible with ileus. Scattered high-density foci are noted about the lower abdomen. No free intra-abdominal air is seen, though evaluation for free air is noted on a single supine view. No acute osseous abnormalities are identified. IMPRESSION: 1. Enteric tube noted ending overlying the antrum of the stomach. 2. Diffuse distention of small and large bowel loops is perhaps slightly improved, though still compatible with ileus. No free intra-abdominal air seen. Electronically Signed   By: Roanna Raider M.D.   On: 09/18/2015 02:58   Dg Abd 1 View  09/17/2015  CLINICAL DATA:  55 year old female status post NG tube placement. Initial encounter. EXAM: ABDOMEN - 1 VIEW COMPARISON:  0547 hours today and earlier. FINDINGS: KUB view of the abdomen at 1333 hours. Worsening gaseous distension of mid in left abdominal bowel loops since 09/13/2015. The pelvis is not included on this image. Increase gray hazy opacity in the abdomen over the recent series  of exams raises the possibility of ascites. Enteric tube is in placed with side hole at the level of the stomach. Abnormal lung bases appear stable since 09/16/2015. IMPRESSION: 1. Abnormal bowel gas pattern, worsening since 09/13/2015, suspicious for Acute Bowel Obstruction and Ascites. CT Abdomen and Pelvis (IV and oral contrast preferred) would evaluate further. 2. NG tube tip and side hole at the level of the stomach. Electronically Signed   By: Odessa Fleming M.D.   On: 09/17/2015 14:08   Dg Abd Portable 1v  09/18/2015  CLINICAL DATA:  Nasogastric tube placement EXAM: PORTABLE ABDOMEN - 1 VIEW COMPARISON:  September 18, 2015 FINDINGS: Nasogastric tube tip and side port are in the stomach. Bowel dilatation remains without appreciable change from earlier in the day. No free air is evident on this supine examination. IMPRESSION: Nasogastric tube tip and side port in stomach. Generalized bowel dilatation remains. Suspect ileus, although a degree of obstruction cannot be excluded. Electronically Signed   By: Bretta Bang III M.D.   On:  09/18/2015 12:17    Assessment/Plan: Shawnte Winton is a 55 y.o. 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 Prior cxs with VRE and Klebsiella - Admit cx mixed but fu again with VRE Foley changed this admission - she was due to have suprapubic cath placed but declined. Has not been on suppressive abx, gets foley changed q 2 weeks at home.  She had CT scan in June to look for other reasons for recurrent UTI . Flu negative Her fever spiked again so changed to dapto and meropenem and improving some.  Repeat UCX with > 100 K VRE.  C diff + Sputum cx neg.  Moving bowels again after ileus  Recommendations Is s/p 11 days treatment with dapto and linezolid for VRE in urine. Remain off bx for this  C diff test + - had ileus as well and so was treated also with IV metronidazole, and  PR vanco Ileus resolving so can change to just  po  vanco  Stop date for PO vanco will be 14 days after stopping the linezolid/zosyn - stop date 09/30/15  I will sign off but please call with questions  Desaray Marschner   09/19/2015, 1:51 PM

## 2015-09-19 NOTE — Progress Notes (Signed)
NIF -22 

## 2015-09-19 NOTE — Progress Notes (Signed)
Report called to [facility] on 2c.  Pt is going to room 215.  Pt is alert.  No complaints of pain.  Orderly was called, phone is busy will call again.

## 2015-09-19 NOTE — Progress Notes (Signed)
Patient's husband in for a visit and is requesting patient get a valium he says she is asking for one.

## 2015-09-19 NOTE — Progress Notes (Signed)
Nutrition Follow-up  DOCUMENTATION CODES:   Severe malnutrition in context of chronic illness  INTERVENTION:   EN: received verbal order from MD Ramachandran to start TF via NG; recommend starting trial of Vital 1.5 at low rate of 20 ml/hr, if tolerates goal rate is 40 ml/hr meeting 100% calorie needs, recommend addition of Prostat daily to better meet protein needs. Continue to assess   NUTRITION DIAGNOSIS:   Inadequate oral intake related to acute illness as evidenced by NPO status. Being addressed via TF   GOAL:   Patient will meet greater than or equal to 90% of their needs  MONITOR:    (Energy intake)  REASON FOR ASSESSMENT:   Consult Enteral/tube feeding initiation and management  ASSESSMENT:    NG remains in place, on D5 infusion for hypoglycemia  Diet Order:   NPO  Digestive System: pt with very large BM during then night, abdomen softer, abdominal xray this AM showing ileus improving, NG with minimal output  Skin:   (stage I pressure ulcer)   Recent Labs Lab 09/15/15 0502 09/16/15 0443 09/17/15 0511 09/18/15 0455 09/19/15 0424  NA 138 136 139 141 141  K 3.1* 3.5 3.4* 3.3* 2.8*  CL 110 109 111 112* 114*  CO2 22 24 21* 24 24  BUN <5* <5*  CREATININE 0.31* 0.39* 0.39* 0.32* 0.39*  CALCIUM 7.2* 7.4* 7.7* 7.6* 7.3*  MG 2.2 1.9 1.8 1.8 1.9  PHOS 2.3* 3.4 3.7  --   --   GLUCOSE 115* 112* 71 92 178*    Glucose Profile:   Recent Labs  09/19/15 0356 09/19/15 0754 09/19/15 1139  GLUCAP 121* 122* 115*   Meds: D5-NS at 100 ml/hr (408 kcals), reglan  Height:   Ht Readings from Last 1 Encounters:  09/11/15  (1.575 m)    Weight:   Wt Readings from Last 1 Encounters:  09/19/15 122 lb 12.7 oz (55.7 kg)    BMI:  Body mass index is 22.45 kg/(m^2).  Estimated Nutritional Needs:   Kcal:  1465-1732 kcals (BEE 1110, 1.2 AF, 1.1-1.3 IF)   Protein:  64-75 g (1.1-1.3 g/kg)   Fluid:  1450-1740 mL (25-30 ml/kg)   EDUCATION NEEDS:    No education needs identified at this time  HIGH Care Level  Romelle Starcher MS, RD, LDN [phone] Pager  [phone] Weekend/On-Call Pager

## 2015-09-19 NOTE — Progress Notes (Signed)
Pt rested well during the night.  She was more alert at the start of the shift, but will wake to voice and follow commands.  Pt is not complaining of belly pain as much as she did at the beginning of the shift.  Pt had a very large lose watery brown stool.  NG tube to left nare continues to be hooked up to low intermittent suction.A total of 100cc out in canister.  Pt remains NPO.   Pt remains on 2L nasal cannula.  Lungs are diminished.Foley remains in place with 425cc out.  Pt's blood sugar dropped to 65 during the night.  1amp of D50 was given.  [facility] was called and per MD, Dr. Isaiah Serge, change the rate of the D5 drip to 100cc/hr.

## 2015-09-19 NOTE — Progress Notes (Signed)
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 S/p VDRF.  Course has been complicated by development of ileus and C. Difficile.   SUBJECTIVE:  No acute events overnight, patient has no complaints, she has been feeling well. She had bowel movements overnight.  ASSESSMENT / PLAN:  A: PULMONARY  Respiratory failure-s/p ventilator dependent- ALS stage 4 Right lower lobe atelectasis/pneumonia; extubated 2/21 P:  Continue to monitor respiratory status, appears better at this time. We'll need to continue to monitor, thus far, the patient has done well with nasal cannula, and has not required bipap.   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  -Avoid nephrotoxic drugs  A: GASTROINTESTINAL GERD Protein calorie malnutrition Dysphagia due to disease progression X-ray images of the abdomen reviewed today showed continued Ileus P -Continue PPI, senna-docusate; dulcolax suppository, miralax. Continue reglan.  -Trial of tube feeds at low rate.  -Continue free water flushes  X-ray abdomen images reviewed, continues to show bowel gas patterns similar from previous days consistent with adynamic ileus.  A: INFECTIOUS DISEASE UTI-VRE Positive for C. Difficile antigen Sepsis 2/2 UTI P:  - Continue vancomycin, flagyl  -ID following.   A: ENDOCRINE Hypoglycemia, pt is NPO.  P -Continue D5, continue monitoring.   A: NEUROLOGIC ALS Seizure disorder Chronic anxiety P:  -continue  nuedexta at home dose. -Continue with antidepressants  -Continue her  Keppra -Low dose precedex for mild agitation  -Continue  home dose of valium -Avoid over sedation, hold zanaflex.  -May require home bipap.    Cultures: BCx2  UC 2/10-VRE Sputum MRSA PCR negative C. Difficile-antigen positive Influenza panel negative 02/15:BAL>NTD  STUDIES:   None   SIGNIFICANT EVENTS: 2/15>> intubated, bronchoscopy BAL-Cultures negative todate -----------------------------------------------    VITAL SIGNS: Temp:  [97.7 F (36.5 C)-98.7 F (37.1 C)] 97.7 F (36.5 C) (02/23 0400) Pulse Rate:  [65-195] 71 (02/23 0600) Resp:  [16-30] 17 (02/23 0600) BP: (93-143)/(58-87) 133/63 mmHg (02/23 0600) SpO2:  [64 %-100 %] 100 % (02/23 0600) Weight:  [122 lb 12.7 oz (55.7 kg)] 122 lb 12.7 oz (55.7 kg) (02/23 0435) HEMODYNAMICS:   VENTILATOR SETTINGS:   INTAKE / OUTPUT:  Intake/Output Summary (Last 24 hours) at 09/19/15 0836 Last data filed at 09/19/15 0500  Gross per 24 hour  Intake 1758.42 ml  Output   1525 ml  Net 233.42 ml    Review of Systems  Unable to perform ROS: medical condition    Physical Exam  Constitutional: No distress.  Chronically ill looking  HENT:  Head: Normocephalic and atraumatic.  Right Ear: External ear normal.  Left Ear: External ear normal.  ETT  and OGT  Eyes: Conjunctivae and EOM are normal. Pupils are equal, round, and reactive to light.  Neck: Normal range of motion. Neck supple. No thyromegaly present.  Cardiovascular: Normal rate, regular rhythm, normal heart sounds and intact distal pulses.   No murmur heard. Pulmonary/Chest: Breath sounds normal. No respiratory distress. She has no wheezes. She has no rales.  Shallow breaths , Bilateral airflow with  diminished breathsounds at the bases  Abdominal: Soft. Bowel sounds are normal. She exhibits no distension.  Musculoskeletal:  She exhibits no edema or tenderness.  Contractures in bilateral lower extremities  Neurological: She is alert.  Awake , following  commands, moves upper extremities but responses are sluggish   Skin: Skin is warm and dry.  Nursing note and vitals reviewed.    LABS:  CBC  Recent Labs Lab 09/15/15 0502 09/16/15 0443 09/18/15 0455  WBC 15.8* 13.4* 8.6  HGB 8.0* 7.9* 8.7*  HCT 24.1* 24.3* 26.1*  PLT 366 365 494*   Coag's No results for input(s): APTT, INR in the last 168 hours. BMET  Recent Labs Lab 09/17/15 0511 09/18/15 0455 09/19/15 0424  NA 139 141 141  K 3.4* 3.3* 2.8*  CL 111 112* 114*  CO2 21* 24 24  BUN 8 <5* <5*  CREATININE 0.39* 0.32* 0.39*  GLUCOSE 71 92 178*   Electrolytes  Recent Labs Lab 09/15/15 0502 09/16/15 0443 09/17/15 0511 09/18/15 0455 09/19/15 0424  CALCIUM 7.2* 7.4* 7.7* 7.6* 7.3*  MG 2.2 1.9 1.8 1.8 1.9  PHOS 2.3* 3.4 3.7  --   --    Sepsis Markers No results for input(s): LATICACIDVEN, PROCALCITON, O2SATVEN in the last 168 hours. ABG  Recent Labs Lab 09/14/15 0938 09/16/15 0500 09/17/15 1045  PHART 7.48* 7.45 7.44  PCO2ART 27* 29* 29*  PO2ART 112* 108 101   Liver Enzymes No results for input(s): AST, ALT, ALKPHOS, BILITOT, ALBUMIN in the last 168 hours. Cardiac Enzymes No results for input(s): TROPONINI, PROBNP in the last 168 hours. Glucose  Recent Labs Lab 09/18/15 1612 09/18/15 1957 09/18/15 2353 09/19/15 0306 09/19/15 0356 09/19/15 0754  GLUCAP 145* 99 96 65 121* 122*    Imaging Dg Abd 1 View  09/19/2015  CLINICAL DATA:  Ileus EXAM: ABDOMEN - 1 VIEW COMPARISON:  09/18/2015 FINDINGS: Improved bowel gas pattern. Decrease in bowel gas in the large and small bowel compared with the prior study. Minimal air in the rectum. NG tube in the stomach. IMPRESSION: Decreased large and small bowel gas and distention compared with prior study. Improving ileus. Electronically Signed   By: Marlan Palau M.D.   On: 09/19/2015 07:08   Dg Abd Portable 1v  09/18/2015  CLINICAL DATA:  Nasogastric tube placement EXAM: PORTABLE ABDOMEN - 1 VIEW COMPARISON:   September 18, 2015 FINDINGS: Nasogastric tube tip and side port are in the stomach. Bowel dilatation remains without appreciable change from earlier in the day. No free air is evident on this supine examination. IMPRESSION: Nasogastric tube tip and side port in stomach. Generalized bowel dilatation remains. Suspect ileus, although a degree of obstruction cannot be excluded. Electronically Signed   By: Bretta Bang III M.D.   On: 09/18/2015 12:17      Deep Nicholos Johns, M.D.

## 2015-09-19 NOTE — Progress Notes (Signed)
eLink Physician-Brief Progress Note Patient Name: Jennifer [name] DOB: Mar 28, 1961 MRN: [id number]   Date of Service  09/19/2015  HPI/Events of Note  Hypoglycemia. BS 65  eICU Interventions  1 amp D50, Increase D5 drip to 100cc/hr     Intervention Category Intermediate Interventions: Other:  [name] 09/19/2015, 3:26 AM

## 2015-09-19 NOTE — Progress Notes (Signed)
PHARMACY - CRITICAL CARE PROGRESS NOTE  Pharmacy Consult for electrolyte management      Allergies  Allergen Reactions  . Bee Venom Anaphylaxis  . Hydrocodone Other (See Comments)    Reaction:  Muscle stiffness   . Lipitor [Atorvastatin] Other (See Comments)    Reaction:  Muscle cramps   . Shellfish Allergy Anaphylaxis  . Oxycodone Other (See Comments)    Reaction:  Muscle stiffness   . Prednisone Other (See Comments)    Reaction:  Psychosis   . Vicodin [Hydrocodone-Acetaminophen] Other (See Comments)    Reaction:  Muscle stiffness   . Oxycontin [Oxycodone Hcl] Other (See Comments)    Reaction:  Muscle stiffness   . Peanut-Containing Drug Products Diarrhea    Patient Measurements: Height:  (157.5 cm) Weight: 122 lb 12.7 oz (55.7 kg) IBW/kg (Calculated) : 50.1  Vital Signs: Temp: 97.9 F (36.6 C) (02/23 1613) Temp Source: Axillary (02/23 1613) BP: 123/80 mmHg (02/23 1500) Pulse Rate: 84 (02/23 1500) Intake/Output from previous day: 02/22 0701 - 02/23 0700 In: 2063.4 [I.V.:1353.4; IV Piggyback:710] Out: 1525 [Urine:1425; Emesis/NG output:100] Intake/Output from this shift: Total I/O In: 1005 [I.V.:800; IV Piggyback:205] Out: 350 [Urine:350] Vent settings for last 24 hours:    Labs:  Recent Labs  09/17/15 0511 09/18/15 0455 09/19/15 0424  WBC  --  8.6  --   HGB  --  8.7*  --   HCT  --  26.1*  --   PLT  --  494*  --   CREATININE 0.39* 0.32* 0.39*  MG 1.8 1.8 1.9  PHOS 3.7  --   --    Estimated Creatinine Clearance: 63.6 mL/min (by C-G formula based on Cr of 0.39).   Recent Labs  09/19/15 0356 09/19/15 0754 09/19/15 1139  GLUCAP 121* 122* 115*    Microbiology: Recent Results (from the past 720 hour(s))  Urine culture     Status: None   Collection Time: 09/04/15  9:01 PM  Result Value Ref Range Status   Specimen Description URINE, RANDOM  Final   Special Requests NONE  Final   Culture MULTIPLE SPECIES PRESENT, SUGGEST RECOLLECTION  Final    Report Status 09/06/2015 FINAL  Final  Blood Culture (routine x 2)     Status: None   Collection Time: 09/04/15  9:41 PM  Result Value Ref Range Status   Specimen Description BLOOD RIGHT ARM  Final   Special Requests BOTTLES DRAWN AEROBIC AND ANAEROBIC  Final   Culture NO GROWTH 5 DAYS  Final   Report Status 09/09/2015 FINAL  Final  Blood Culture (routine x 2)     Status: None   Collection Time: 09/04/15  9:41 PM  Result Value Ref Range Status   Specimen Description BLOOD LEFT ARM  Final   Special Requests BOTTLES DRAWN AEROBIC AND ANAEROBIC  Final   Culture NO GROWTH 5 DAYS  Final   Report Status 09/09/2015 FINAL  Final  Urine culture     Status: None   Collection Time: 09/06/15  4:58 PM  Result Value Ref Range Status   Specimen Description URINE, CATHETERIZED  Final   Special Requests ertapenem and vanco Normal  Final   Culture   Final    >=100,000 COLONIES/mL VANCOMYCIN RESISTANT ENTEROCOCCUS ENTEROCOCCUS FAECIUM VRE HAVE INTRINSIC RESISTANCE TO MOST COMMONLY USED ANTIBIOTICS AND THE ABILITY TO ACQUIRE RESISTANCE TO MOST AVAILABLE ANTIBIOTICS. WITH MIXED BACTERIAL ORGANISMS    Report Status 09/10/2015 FINAL  Final   Organism ID, Bacteria VANCOMYCIN  RESISTANT ENTEROCOCCUS  Final      Susceptibility   Vancomycin resistant enterococcus - MIC*    AMPICILLIN >=32 RESISTANT Resistant     LEVOFLOXACIN >=8 RESISTANT Resistant     NITROFURANTOIN 128 RESISTANT Resistant     VANCOMYCIN >=32 RESISTANT Resistant     LINEZOLID 2 SENSITIVE Sensitive     * >=100,000 COLONIES/mL VANCOMYCIN RESISTANT ENTEROCOCCUS  C difficile quick scan w PCR reflex     Status: Abnormal   Collection Time: 09/09/15  4:14 PM  Result Value Ref Range Status   C Diff antigen POSITIVE (A) NEGATIVE Final   C Diff toxin NEGATIVE NEGATIVE Final   C Diff interpretation   Final    Positive for toxigenic C. difficile, active toxin production not detected. Patient has toxigenic C. difficile organisms present  in the bowel, but toxin was not detected. The patient may be a carrier or the level of toxin in the sample was below the limit  of detection. This information should be used in conjunction with the patient's clinical history when deciding on possible therapy.     Comment: CRITICAL RESULT CALLED TO, READ BACK BY AND VERIFIED WITH: Burnett Corrente ON 09/09/15 AT 2019 BY TLB   Clostridium Difficile by PCR     Status: Abnormal   Collection Time: 09/09/15  4:14 PM  Result Value Ref Range Status   Toxigenic C Difficile by pcr POSITIVE (A) NEGATIVE Final    Comment: CRITICAL RESULT CALLED TO, READ BACK BY AND VERIFIED WITH: Burnett Corrente ON 09/09/15 AT 2019 BY TLB   MRSA PCR Screening     Status: None   Collection Time: 09/11/15  6:52 AM  Result Value Ref Range Status   MRSA by PCR NEGATIVE NEGATIVE Final    Comment:        The GeneXpert MRSA Assay (FDA approved for NASAL specimens only), is one component of a comprehensive MRSA colonization surveillance program. It is not intended to diagnose MRSA infection nor to guide or monitor treatment for MRSA infections.   Culture, bal-quantitative     Status: None   Collection Time: 09/11/15  2:21 PM  Result Value Ref Range Status   Specimen Description BRONCHIAL ALVEOLAR LAVAGE  Final   Special Requests Normal BRONCHIAL BRUSHING  Final   Gram Stain   Final    FEW WBC SEEN RARE SQUAMOUS EPITHELIAL CELLS PRESENT NO ORGANISMS SEEN    Culture Consistent with normal respiratory flora.  Final   Report Status 09/13/2015 FINAL  Final  Fungus Culture with Smear     Status: None (Preliminary result)   Collection Time: 09/11/15  2:21 PM  Result Value Ref Range Status   Specimen Description BRONCHIAL BRUSHING  Final   Special Requests Normal  Final   Culture CANDIDA GLABRATA  Final   Report Status PENDING  Incomplete    Medications:  Scheduled:  . antiseptic oral rinse  7 mL Mouth Rinse QID  . bisacodyl  10 mg Rectal Daily  . buPROPion  100  mg Oral BID  . Dextromethorphan-Quinidine  1 capsule Oral BID  . docusate  100 mg Oral BID  . enoxaparin (LOVENOX) injection  40 mg Subcutaneous Q24H  . famotidine  20 mg Oral BID  . free water  200 mL Per Tube 3 times per day  . lactulose  10 g Oral BID  . levETIRAcetam  500 mg Intravenous Q12H  . metoCLOPramide (REGLAN) injection  10 mg Intravenous 3 times per day  . phenylephrine  1 suppository Rectal BID  . polyethylene glycol  17 g Oral BID  . potassium chloride  40 mEq Oral TID  . sennosides  10 mL Oral BID  . sodium chloride  1,000 mL Intravenous Once  . vancomycin  125 mg Oral 4 times per day   Infusions:  . dextrose 5 % and 0.9% NaCl 100 mL/hr at 09/19/15 0700  . feeding supplement (VITAL 1.5 CAL) 1,000 mL (09/19/15 1612)    Assessment: Pharmacy consulted for electrolyte management for 55 yo female ICU patient requiring mechanical ventilation.     Plan:  Patient received potassium VT TID x 3 doses and magnesium 2g IV x 1. Will recheck electrolytes with am labs.   Pharmacy will continue to monitor and adjust per consult.    Deniesha Stenglein L, Pharm.D. 09/19/2015,4:35 PM

## 2015-09-19 NOTE — Progress Notes (Signed)
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 Unable to obtain any sort of history from the patient as she lives. She shook her head no when I asked her about abdominal pain this afternoon and that is different than Dr. Chrystine Oiler exam earlier today.  Vital signs are reviewed Abdomen is soft and minimally distended nontender NG in place  Recommend continuing NG for ileus at this point until we can be clear that she is not obstructed. I am reminded that long discussion with her daughters earlier in her hospitalization concerning perirectal problem resulted in the adamant relief from the daughters that she should not have any surgery for surgical intervention is not indicated in this patient. We'll be available as needed

## 2015-09-19 NOTE — Progress Notes (Signed)
Pt appears to be a little less alert and prime doc was paged to make them aware.  Per Dr. Sheryle Hail, get ABG and ask respiratory to do NIF test.  Orders were placed.

## 2015-09-19 NOTE — Progress Notes (Signed)
Patient ID: Jennifer [name], female   DOB: 24-Jan-1961, 55 y.o.   MRN: [id number] [facility] PROGRESS NOTE  Jennifer [name] WUJ:[id number] DOB: 1960/08/01 DOA: 09/04/2015 PCP: Ruthe Mannan, MD  HPI/Subjective: Patient asked me for water. Shook her head yes to abdominal pain. Did shake her head yes or no to some questions.  Objective: Filed Vitals:   09/19/15 1000 09/19/15 1100  BP: 116/55 113/66  Pulse:    Temp:    Resp: 15 14    Filed Weights   09/17/15 0359 09/18/15 0545 09/19/15 0435  Weight: 57.4 kg (126 lb 8.7 oz) 58.2 kg (128 lb 4.9 oz) 55.7 kg (122 lb 12.7 oz)    ROS: Review of Systems  Respiratory: Negative for cough and shortness of breath.   Cardiovascular: Negative for chest pain.  Gastrointestinal: Positive for abdominal pain. Negative for nausea, vomiting, diarrhea and constipation.   Limited because she only spoke one word to me Exam:  Physical Exam  Constitutional: She is oriented to person, place, and time.  HENT:  Nose: No mucosal edema.  Mouth/Throat: No oropharyngeal exudate or posterior oropharyngeal edema.  Eyes: Conjunctivae, EOM and lids are normal. Pupils are equal, round, and reactive to light.  Neck: No JVD present. Carotid bruit is not present. No edema present. No thyroid mass and no thyromegaly present.  Cardiovascular: S1 normal and S2 normal.  Exam reveals no gallop.   No murmur heard. Pulses:      Dorsalis pedis pulses are 2+ on the right side, and 2+ on the left side.  Respiratory: No accessory muscle usage. No respiratory distress. She has no wheezes. She has rhonchi in the right lower field and the left lower field. She has no rales.  GI: Soft. Bowel sounds are normal. There is no tenderness.  Musculoskeletal:       Right ankle: She exhibits swelling.       Left ankle: She exhibits swelling.  Lymphadenopathy:    She has no cervical adenopathy.  Neurological: She is alert and oriented to person, place, and time. No cranial nerve  deficit.  Skin: Skin is warm. No rash noted. Nails show no clubbing.  Psychiatric: She has a normal mood and affect.      Data Reviewed: Basic Metabolic Panel:  Recent Labs Lab 09/14/15 0500 09/15/15 0502 09/16/15 0443 09/17/15 0511 09/18/15 0455 09/19/15 0424  NA 139 138 136 139 141 141  K 3.4* 3.1* 3.5 3.4* 3.3* 2.8*  CL 111 110 109 111 112* 114*  CO2 25 22 24  21* 24 24  GLUCOSE 129* 115* 112* 71 92 178*  BUN 14 15 13 8  <5* <5*  CREATININE 0.32* 0.31* 0.39* 0.39* 0.32* 0.39*  CALCIUM 7.2* 7.2* 7.4* 7.7* 7.6* 7.3*  MG 1.6* 2.2 1.9 1.8 1.8 1.9  PHOS 2.7 2.3* 3.4 3.7  --   --    CBC:  Recent Labs Lab 09/13/15 0505 09/15/15 0502 09/16/15 0443 09/18/15 0455  WBC 17.1* 15.8* 13.4* 8.6  HGB 9.4* 8.0* 7.9* 8.7*  HCT 28.8* 24.1* 24.3* 26.1*  MCV 94.3 93.9 93.2 93.2  PLT 440 366 365 494*    CBG:  Recent Labs Lab 09/18/15 2353 09/19/15 0306 09/19/15 0356 09/19/15 0754 09/19/15 1139  GLUCAP 96 65 121* 122* 115*    Recent Results (from the past 240 hour(s))  C difficile quick scan w PCR reflex     Status: Abnormal   Collection Time: 09/09/15  4:14 PM  Result Value Ref Range Status   C  Diff antigen POSITIVE (A) NEGATIVE Final   C Diff toxin NEGATIVE NEGATIVE Final   C Diff interpretation   Final    Positive for toxigenic C. difficile, active toxin production not detected. Patient has toxigenic C. difficile organisms present in the bowel, but toxin was not detected. The patient may be a carrier or the level of toxin in the sample was below the limit  of detection. This information should be used in conjunction with the patient's clinical history when deciding on possible therapy.     Comment: CRITICAL RESULT CALLED TO, READ BACK BY AND VERIFIED WITH: Burnett Corrente ON 09/09/15 AT 2019 BY TLB   Clostridium Difficile by PCR     Status: Abnormal   Collection Time: 09/09/15  4:14 PM  Result Value Ref Range Status   Toxigenic C Difficile by pcr POSITIVE (A)  NEGATIVE Final    Comment: CRITICAL RESULT CALLED TO, READ BACK BY AND VERIFIED WITH: Burnett Corrente ON 09/09/15 AT 2019 BY TLB   MRSA PCR Screening     Status: None   Collection Time: 09/11/15  6:52 AM  Result Value Ref Range Status   MRSA by PCR NEGATIVE NEGATIVE Final    Comment:        The GeneXpert MRSA Assay (FDA approved for NASAL specimens only), is one component of a comprehensive MRSA colonization surveillance program. It is not intended to diagnose MRSA infection nor to guide or monitor treatment for MRSA infections.   Culture, bal-quantitative     Status: None   Collection Time: 09/11/15  2:21 PM  Result Value Ref Range Status   Specimen Description BRONCHIAL ALVEOLAR LAVAGE  Final   Special Requests Normal BRONCHIAL BRUSHING  Final   Gram Stain   Final    FEW WBC SEEN RARE SQUAMOUS EPITHELIAL CELLS PRESENT NO ORGANISMS SEEN    Culture Consistent with normal respiratory flora.  Final   Report Status 09/13/2015 FINAL  Final  Fungus Culture with Smear     Status: None (Preliminary result)   Collection Time: 09/11/15  2:21 PM  Result Value Ref Range Status   Specimen Description BRONCHIAL BRUSHING  Final   Special Requests Normal  Final   Culture CANDIDA GLABRATA  Final   Report Status PENDING  Incomplete     Studies: Dg Abd 1 View  09/19/2015  CLINICAL DATA:  Ileus EXAM: ABDOMEN - 1 VIEW COMPARISON:  09/18/2015 FINDINGS: Improved bowel gas pattern. Decrease in bowel gas in the large and small bowel compared with the prior study. Minimal air in the rectum. NG tube in the stomach. IMPRESSION: Decreased large and small bowel gas and distention compared with prior study. Improving ileus. Electronically Signed   By: Marlan Palau M.D.   On: 09/19/2015 07:08   Dg Abd 1 View  09/18/2015  CLINICAL DATA:  Follow-up ileus.  Subsequent encounter. EXAM: ABDOMEN - 1 VIEW COMPARISON:  Abdominal radiograph performed 09/17/2015 FINDINGS: The patient's enteric tube is noted  ending overlying the antrum of the stomach. There is persistent diffuse distention of small and large bowel loops, perhaps slightly improved from the prior study, compatible with ileus. Scattered high-density foci are noted about the lower abdomen. No free intra-abdominal air is seen, though evaluation for free air is noted on a single supine view. No acute osseous abnormalities are identified. IMPRESSION: 1. Enteric tube noted ending overlying the antrum of the stomach. 2. Diffuse distention of small and large bowel loops is perhaps slightly improved, though still compatible with ileus.  No free intra-abdominal air seen. Electronically Signed   By: Roanna Raider M.D.   On: 09/18/2015 02:58   Dg Abd Portable 1v  09/18/2015  CLINICAL DATA:  Nasogastric tube placement EXAM: PORTABLE ABDOMEN - 1 VIEW COMPARISON:  September 18, 2015 FINDINGS: Nasogastric tube tip and side port are in the stomach. Bowel dilatation remains without appreciable change from earlier in the day. No free air is evident on this supine examination. IMPRESSION: Nasogastric tube tip and side port in stomach. Generalized bowel dilatation remains. Suspect ileus, although a degree of obstruction cannot be excluded. Electronically Signed   By: Bretta Bang III M.D.   On: 09/18/2015 12:17    Scheduled Meds: . antiseptic oral rinse  7 mL Mouth Rinse QID  . bisacodyl  10 mg Rectal Daily  . buPROPion  100 mg Oral BID  . Dextromethorphan-Quinidine  1 capsule Oral BID  . docusate  100 mg Oral BID  . enoxaparin (LOVENOX) injection  40 mg Subcutaneous Q24H  . famotidine  20 mg Oral BID  . free water  200 mL Per Tube 3 times per day  . lactulose  10 g Oral BID  . levETIRAcetam  500 mg Intravenous Q12H  . magnesium sulfate 1 - 4 g bolus IVPB  2 g Intravenous Once  . metoCLOPramide (REGLAN) injection  10 mg Intravenous 3 times per day  . phenylephrine  1 suppository Rectal BID  . polyethylene glycol  17 g Oral BID  . potassium chloride   40 mEq Oral TID  . sennosides  10 mL Oral BID  . sodium chloride  1,000 mL Intravenous Once  . vancomycin  125 mg Oral 4 times per day   Continuous Infusions: . dextrose 5 % and 0.9% NaCl 100 mL/hr at 09/19/15 0700  . feeding supplement (VITAL 1.5 CAL)      Assessment/Plan:  1. Acute hypoxic respiratory failure, aspiration pneumonia and sepsis, ALS.  The patient was intubated 09/11/15 and extubated 09/17/2015. Patient breathing comfortably on nasal cannula. I will try to get out of the ICU today. 2. Acute metabolic encephalopathy secondary to sepsis. VRE UTI,  Aspiration pneumonia and finished antibiotics for this. Clostridium difficile positive on oral vancomycin and Flagyl 3. Ileus resolved on last x-ray. Try to start tube feeds. 4. Hypokalemia, hypomagnesemia- replace IV magnesium today and NG tube potassium supplementation. Restarting tube feeds hopefully electrolytes will become less of an issue. 5. Seizure disorder on Keppra 6. ALS- bedbound.  7. Dysphagia- speech evaluation. Patient asking for water 8. Severe malnutrition 9. Hypoglycemia- on D5NS  Code Status:     Code Status Orders        Start     Ordered   09/04/15 2348  Full code   Continuous     09/04/15 2348    Code Status History    Date Active Date Inactive Code Status Order ID Comments User Context   06/26/2015  7:02 PM 06/29/2015  7:16 PM Partial Code [id number]  Milagros Loll, MD ED   01/04/2015  7:34 PM 01/06/2015  7:24 PM Full Code [id number]  Ramonita Lab, MD Inpatient   12/31/2014 11:17 PM 01/03/2015  9:35 PM Full Code [id number]  Shaune Pollack, MD Inpatient   09/15/2014 12:58 PM 09/16/2014  3:59 PM Full Code [id number]  Suzi Roots, MD ED     Disposition Plan: To be determined, spoke with the husband on the phone.  Consultants:  Critical care specialist  Gen. Surgery  Infectious disease  Antibiotics:  Oral vancomycin  Oral Flagyl  Time spent: 20 minutes  Alford Highland  [facility]  Hospitalists

## 2015-09-20 ENCOUNTER — Inpatient Hospital Stay: Payer: BLUE CROSS/BLUE SHIELD

## 2015-09-20 LAB — CBC
HEMATOCRIT: 24.4 % — AB (ref 35.0–47.0)
Hemoglobin: 8.3 g/dL — ABNORMAL LOW (ref 12.0–16.0)
MCH: 31.5 pg (ref 26.0–34.0)
MCHC: 34 g/dL (ref 32.0–36.0)
MCV: 92.6 fL (ref 80.0–100.0)
PLATELETS: 476 10*3/uL — AB (ref 150–440)
RBC: 2.64 MIL/uL — ABNORMAL LOW (ref 3.80–5.20)
RDW: 15.5 % — AB (ref 11.5–14.5)
WBC: 10.7 10*3/uL (ref 3.6–11.0)

## 2015-09-20 LAB — GLUCOSE, CAPILLARY
GLUCOSE-CAPILLARY: 85 mg/dL (ref 65–99)
Glucose-Capillary: 115 mg/dL — ABNORMAL HIGH (ref 65–99)
Glucose-Capillary: 112 mg/dL — ABNORMAL HIGH (ref 65–99)
Glucose-Capillary: 99 mg/dL (ref 65–99)
Glucose-Capillary: 62 mg/dL — ABNORMAL LOW (ref 65–99)
Glucose-Capillary: 84 mg/dL (ref 65–99)

## 2015-09-20 LAB — BASIC METABOLIC PANEL
Anion gap: 4 — ABNORMAL LOW (ref 5–15)
CALCIUM: 7.6 mg/dL — AB (ref 8.9–10.3)
CHLORIDE: 117 mmol/L — AB (ref 101–111)
CO2: 23 mmol/L (ref 22–32)
Glucose, Bld: 125 mg/dL — ABNORMAL HIGH (ref 65–99)
Potassium: 4 mmol/L (ref 3.5–5.1)
SODIUM: 144 mmol/L (ref 135–145)

## 2015-09-20 LAB — PHOSPHORUS: Phosphorus: 2.2 mg/dL — ABNORMAL LOW (ref 2.5–4.6)

## 2015-09-20 LAB — MAGNESIUM: MAGNESIUM: 2 mg/dL (ref 1.7–2.4)

## 2015-09-20 MED ORDER — VITAL 1.5 CAL PO LIQD
1000.0000 mL | ORAL | Status: DC
  Administered 2015-09-20 – 2015-09-22 (×3): 1000 mL

## 2015-09-20 MED ORDER — DEXTROSE 50 % IV SOLN
1.0000 | Freq: Once | INTRAVENOUS | Status: AC
  Administered 2015-09-20: 50 mL via INTRAVENOUS
  Filled 2015-09-20: qty 50

## 2015-09-20 MED ORDER — POTASSIUM & SODIUM PHOSPHATES 280-160-250 MG PO PACK
1.0000 | PACK | ORAL | Status: AC
  Administered 2015-09-20 (×2): 1 via ORAL
  Filled 2015-09-20 (×6): qty 1

## 2015-09-20 NOTE — Progress Notes (Signed)
NG tube placed. Awaiting Xray to confirm placement.

## 2015-09-20 NOTE — Progress Notes (Signed)
[facility] - [place] at [facility]   PATIENT NAME: Jennifer [name]    MR#:  [id number]  DATE OF BIRTH:  Nov 12, 1960  SUBJECTIVE:  No overnight events, NG-tube fell out this morning Patient unable to provide meaningful information given mental status medical condition  REVIEW OF SYSTEMS:  Unable to obtain at this time given patient's mental status medical condition  DRUG ALLERGIES:   Allergies  Allergen Reactions  . Bee Venom Anaphylaxis  . Hydrocodone Other (See Comments)    Reaction:  Muscle stiffness   . Lipitor [Atorvastatin] Other (See Comments)    Reaction:  Muscle cramps   . Shellfish Allergy Anaphylaxis  . Oxycodone Other (See Comments)    Reaction:  Muscle stiffness   . Prednisone Other (See Comments)    Reaction:  Psychosis   . Vicodin [Hydrocodone-Acetaminophen] Other (See Comments)    Reaction:  Muscle stiffness   . Oxycontin [Oxycodone Hcl] Other (See Comments)    Reaction:  Muscle stiffness   . Peanut-Containing Drug Products Diarrhea    VITALS:  Blood pressure 147/69, pulse 81, temperature 98.4 F (36.9 C), temperature source Axillary, resp. rate 16, height  (1.575 m), weight 59.829 kg (131 lb 14.4 oz), SpO2 99 %.  PHYSICAL EXAMINATION:  VITAL SIGNS: Filed Vitals:   09/20/15 0524 09/20/15 1150  BP: 105/73 147/69  Pulse: 84 81  Temp: 98.4 F (36.9 C)   Resp: 17 16   GENERAL:55 y.o.female currently chronically ill-appearing HEAD: Normocephalic, atraumatic.  EYES: Pupils equal, round, reactive to light. Extraocular muscles intact. No scleral icterus.  MOUTH: Moist mucosal membrane. Dentition intact. No abscess noted.  EAR, NOSE, THROAT: Clear without exudates. No external lesions.  NECK: Supple. No thyromegaly. No nodules. No JVD.  PULMONARY: Diminished breath sounds secondary to effort without wheeze rails or rhonci. No use of accessory muscles, poor respiratory effort. Poor air entry bilaterally CHEST: Nontender to  palpation.  CARDIOVASCULAR: S1 and S2. Regular rate and rhythm. No murmurs, rubs, or gallops. No edema. Pedal pulses 2+ bilaterally.  GASTROINTESTINAL: Soft, nontender, nondistended. No masses. Positive bowel sounds. No hepatosplenomegaly.  MUSCULOSKELETAL: No swelling, clubbing, or edema. Range of motion full in all extremities.  NEUROLOGIC: Unable to fully assess given patient's mental status medical condition SKIN: No ulceration, lesions, rashes, or cyanosis. Skin warm and dry. Turgor intact.  PSYCHIATRIC: Unable to fully assess given patient's mental status medical condition     LABORATORY PANEL:   CBC  Recent Labs Lab 09/20/15 0533  WBC 10.7  HGB 8.3*  HCT 24.4*  PLT 476*   ------------------------------------------------------------------------------------------------------------------  Chemistries   Recent Labs Lab 09/20/15 0533  NA 144  K 4.0  CL 117*  CO2 23  GLUCOSE 125*  BUN <5*  CREATININE <0.30*  CALCIUM 7.6*  MG 2.0   ------------------------------------------------------------------------------------------------------------------  Cardiac Enzymes No results for input(s): TROPONINI in the last 168 hours. ------------------------------------------------------------------------------------------------------------------  RADIOLOGY:  Dg Abd 1 View  09/19/2015  CLINICAL DATA:  Ileus EXAM: ABDOMEN - 1 VIEW COMPARISON:  09/18/2015 FINDINGS: Improved bowel gas pattern. Decrease in bowel gas in the large and small bowel compared with the prior study. Minimal air in the rectum. NG tube in the stomach. IMPRESSION: Decreased large and small bowel gas and distention compared with prior study. Improving ileus. Electronically Signed   By: Marlan Palau M.D.   On: 09/19/2015 07:08    EKG:   Orders placed or performed during the hospital encounter of 09/04/15  . ED EKG 12-Lead  .  ED EKG 12-Lead  . EKG 12-Lead  . EKG 12-Lead  . EKG 12-Lead  . EKG 12-Lead     ASSESSMENT AND PLAN:  55 year old Caucasian female history of ALS admitted 09/04/15 with altered mental status and sepsis. She accompanied hospital course thus far including biliary dependent respiratory failure successfully extubated 09/17/15  1. Acute hypoxic respiratory failure, aspiration pneumonia and sepsis, ALS.-Extubated 09/17/15 2. Acute metabolic encephalopathy secondary to sepsis.  -VRE UTI,  -Aspiration pneumonia and finished antibiotics for this.  -Clostridium difficile positive on oral vancomycin and Flagyl 3. Ileus resolved on last x-ray. Try to start tube feeds. 4. Hypokalemia, hypomagnesemia- replace IV magnesium today and NG tube potassium supplementation. Restarting tube feeds hopefully electrolytes will become less of an issue. 5. Seizure disorder on Keppra 6. ALS- bedbound.  7. Dysphagia- patient failed speech eval with both oral pharyngeal and esophageal motility issues 8. Severe malnutrition 9. Hypoglycemia- on D5NS     All the records are reviewed and case discussed with Care Management/Social Workerr. Management plans discussed with the patient, family and they are in agreement.  CODE STATUS: Full  TOTAL TIME TAKING CARE OF THIS PATIENT: 28 minutes.   POSSIBLE D/C IN 3-4 DAYS, DEPENDING ON CLINICAL CONDITION.   Hower,  Mardi Mainland.D on 09/20/2015 at 12:37 PM  Between 7am to 6pm - Pager - [phone]  After 6pm: House Pager: - [phone]  [facility]  Office  [phone]  CC: Primary care physician; Ruthe Mannan, MD

## 2015-09-20 NOTE — Progress Notes (Addendum)
Discussed case with patient's husband [name] [phone].  About failing speech evaluation.  Gen. concern NG tube placement with lead to continued aspiration, surgical G-tube will also presents similar issues with aspiration given esophageal dysmotility. The husband seems to understand this but at the same time he is hoping she makes a meaningful recovery and would not need tube feedings in the long-term. I tried to express this may indeed be her new baseline at that time he stated this she would want surgical intervention as required. We'll reinsert NG tube and continue feeding Time spent 25 minutes

## 2015-09-20 NOTE — Progress Notes (Signed)
PHARMACY - MEDICATION RELATED PROGRESS NOTE  Pharmacy Consult for electrolyte management    Patient Measurements: Height:  (157.5 cm) Weight: 131 lb 14.4 oz (59.829 kg) IBW/kg (Calculated) : 50.1  Vital Signs: Temp: 98.4 F (36.9 C) (02/24 0524) Temp Source: Axillary (02/24 0524) BP: 105/73 mmHg (02/24 0524) Pulse Rate: 84 (02/24 0524) Intake/Output from previous day: 02/23 0701 - 02/24 0700 In: 3154 [I.V.:2171; NG/GT:598; IV Piggyback:385] Out: 900 [Urine:900] Intake/Output from this shift:   Vent settings for last 24 hours:    Labs:  Recent Labs  09/18/15 0455 09/19/15 0424 09/20/15 0533  WBC 8.6  --  10.7  HGB 8.7*  --  8.3*  HCT 26.1*  --  24.4*  PLT 494*  --  476*  CREATININE 0.32* 0.39* <0.30*  MG 1.8 1.9 2.0  PHOS  --   --  2.2*   CrCl cannot be calculated (Patient has no serum creatinine result on file.).   Recent Labs  09/19/15 2057 09/20/15 0055 09/20/15 0525  GLUCAP 117* 115* 112*    Assessment: Pharmacy consulted for electrolyte management for 55 yo female with ALS and c. diff.   K & Mg WNL @ 4.0 & 2.0 respectively Phos of 2.2 is low  Plan:  K-Phos packet every 4 hours x 4 doses Will recheck electrolytes with AM labs tomorrow  Thank you for allowing pharmacy to be part of this patient's care.   Cindi Carbon, Pharm.D. Clinical Pharmacist 09/20/2015,7:57 AM

## 2015-09-20 NOTE — Progress Notes (Signed)
NT and I were changing the pt, Tube feeds were on hold and pt sneezed twice which then caused her NG tube to expel out. MD paged. Awaiting a call back.

## 2015-09-20 NOTE — Progress Notes (Signed)
MD acknowledged NG tube out, Ordered to leave it out and D/C IV fluids.

## 2015-09-20 NOTE — Progress Notes (Signed)
Pt not able to perform NIF. 

## 2015-09-20 NOTE — Progress Notes (Signed)
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 S/p VDRF.    SUBJECTIVE:  No acute events overnight, patient has no complaints, she has been feeling well.   ASSESSMENT / PLAN:  A: PULMONARY  Respiratory failure-s/p ventilator dependent- ALS stage 4 Right lower lobe atelectasis/pneumonia; extubated 2/21 P:  Continue to monitor respiratory status, appears better at this time. Continue to wean down oxygen as tolerated, currently the patient does not appear to require nocturnal bipap. Pt needs to follow up with her ALS clinic at [facility] for continued monitoring of her respiratory status to determine if/when she would require this.  She can follow up with [place] on a prn basis.     A: NEUROLOGIC ALS Chronic anxiety P:  -continue nuedexta at home dose. -Continue with antidepressants  -Continue her Keppra -Low dose precedex for mild agitation  -Continue home dose of valium -Avoid over sedation, hold zanaflex.  -May require home bipap.     SIGNIFICANT EVENTS: 2/15>> intubated, bronchoscopy BAL-Cultures negative todate  Pulmonary service will sign off for now. Please call if there are any further questions or concerns. The patient needs to follow up with the ALS clinic at [facility], the patient can follow up with [place] in our office on an  as needed basis.  Date: 09/20/2015  MRN# [id number] Jennifer [name] 1961/05/15   Jennifer [name] is a 55 y.o. old female seen in follow up for chief complaint of  Chief Complaint  Patient presents with  . Altered Mental Status     HPI:   The is awake and alert today, currently on nasal cannula.   Allergies:  Bee venom; Hydrocodone; Lipitor; Shellfish allergy; Oxycodone; Prednisone; Vicodin; Oxycontin; and Peanut-containing drug products  Review of Systems: Gen:  Denies  fever, sweats. HEENT:  Denies blurred vision. Cvc:  No dizziness, chest pain or heaviness Resp:   Denies cough or sputum porduction. Gi: Denies swallowing difficulty, stomach pain.  Gu:  Denies bladder incontinence, burning urine Ext:   No Joint pain, stiffness. Skin: No skin rash, easy bruising. Endoc:  No polyuria, polydipsia. Psych: No depression, insomnia. Other:  All other systems were reviewed and found to be negative other than what is mentioned in the HPI.   Physical Examination:   VS: BP 147/69 mmHg  Pulse 81  Temp(Src) 98.4 F (36.9 C) (Axillary)  Resp 16  Ht  (1.575 m)  Wt 131 lb 14.4 oz (59.829 kg)  BMI 24.12 kg/m2  SpO2 99%  General Appearance: No distress  Neuro:without focal findings,  speech normal,  HEENT: PERRLA, EOM intact. Pulmonary: normal breath sounds, No wheezing.   CardiovascularNormal S1,S2.  No m/r/g.   Abdomen: Benign, Soft, non-tender. Renal:  No costovertebral tenderness  GU:  Not performed at this time. Endoc: No evident thyromegaly, no signs of acromegaly. Skin:   warm, no rash. Extremities: normal, no cyanosis, clubbing.   LABORATORY PANEL:   CBC  Recent Labs Lab 09/20/15 0533  WBC 10.7  HGB 8.3*  HCT 24.4*  PLT 476*   ------------------------------------------------------------------------------------------------------------------  Chemistries   Recent Labs Lab 09/20/15 0533  NA 144  K 4.0  CL 117*  CO2 23  GLUCOSE 125*  BUN <5*  CREATININE <0.30*  CALCIUM 7.6*  MG 2.0   ------------------------------------------------------------------------------------------------------------------  Cardiac Enzymes No results for input(s): TROPONINI in the last 168 hours. ------------------------------------------------------------  RADIOLOGY:  No results found for this or any previous visit. No results found for this or any previous  visit. ------------------------------------------------------------------------------------------------------------------  Thank  you for allowing [facility] to assist in the care of your patient. Our recommendations are noted above.  Please contact us if we can be of further service.   Wells Guiles, MD.  [facility] Office Number: [phone]  Santiago Glad, M.D.  Stephanie Acre, M.D.  Billy Fischer, M.D

## 2015-09-20 NOTE — Progress Notes (Signed)
Nutrition Follow-up  DOCUMENTATION CODES:   Severe malnutrition in context of chronic illness  INTERVENTION:   EN: recommend restarting Vital 1.5 at rate of 20 ml/hr, goal rate of 40 ml/hr with Prostat daily  NUTRITION DIAGNOSIS:   Inadequate oral intake related to acute illness as evidenced by NPO status.  GOAL:   Patient will meet greater than or equal to 90% of their needs  MONITOR:    (Energy intake)  REASON FOR ASSESSMENT:   Consult Enteral/tube feeding initiation and management  ASSESSMENT:    NG tube came out while pt coughing this AM, MD discussed nutritional plan with husband and NG to be replaced  Diet Order:   NPO  EN: pt tolerating Vital 1.5 TF at rate of 20 ml/hr previously, on hold at present due to no access  Digestive System: NG just placed by RN, awaiting xray confirmation; +large loose BM today  Skin:   (stage I pressure ulcer)   Recent Labs Lab 09/16/15 0443 09/17/15 0511 09/18/15 0455 09/19/15 0424 09/20/15 0533  NA 136 139 141 141 144  K 3.5 3.4* 3.3* 2.8* 4.0  CL 109 111 112* 114* 117*  CO2 24 21* BUN 13 8 <5* <5* <5*  CREATININE 0.39* 0.39* 0.32* 0.39* <0.30*  CALCIUM 7.4* 7.7* 7.6* 7.3* 7.6*  MG 1.9 1.8 1.8 1.9 2.0  PHOS 3.4 3.7  --   --  2.2*  GLUCOSE 112* 71 92 178* 125*    Glucose Profile:  Recent Labs  09/20/15 0055 09/20/15 0525 09/20/15 0818  GLUCAP 115* 112* 99   Meds: reviewed  Height:   Ht Readings from Last 1 Encounters:  09/11/15  (1.575 m)    Weight:   Wt Readings from Last 1 Encounters:  09/20/15 131 lb 14.4 oz (59.829 kg)    Filed Weights   09/18/15 0545 09/19/15 0435 09/20/15 0500  Weight: 128 lb 4.9 oz (58.2 kg) 122 lb 12.7 oz (55.7 kg) 131 lb 14.4 oz (59.829 kg)    BMI:  Body mass index is 24.12 kg/(m^2).  Estimated Nutritional Needs:   Kcal:  1465-1732 kcals (BEE 1110, 1.2 AF, 1.1-1.3 IF)   Protein:  64-75 g (1.1-1.3 g/kg)   Fluid:  1450-1740 mL (25-30 ml/kg)    EDUCATION NEEDS:   No education needs identified at this time  HIGH Care Level  Romelle Starcher MS, RD, LDN [phone] Pager  [phone] Weekend/On-Call Pager

## 2015-09-20 NOTE — Progress Notes (Signed)
Speech Therapy Note: reviewed chart notes; discussed w/ NSG. Consulted primary MD re: pt's po feeding status and the concern for high risk of prandial aspiration sec. to moderate-severe oropharyngeal phase dysphagia as well as risk for aspiration of Reflux d/t moderate-severe Esophageal phase deficits per MBSS performed on 2.10.17. Due to pt's declined medical status and weakness overall from this extended hospitalization including oral intubation on vent for ~1 week (and w/ consideration of impact of ALS), pt would be at high risk for aspiration as well as high risk for not meeting her nutrition/hydration needs adequately d/t the work of po intake. Rec. Consideration of more permanent alternative means of feeding (PEG) if appropriate at this time.  MD agreed and will meet w/ the husband to discuss pt's POC as it pertains to safe nutrition/hydration. ST will be available as needed.

## 2015-09-21 LAB — GLUCOSE, CAPILLARY
GLUCOSE-CAPILLARY: 72 mg/dL (ref 65–99)
GLUCOSE-CAPILLARY: 92 mg/dL (ref 65–99)
Glucose-Capillary: 76 mg/dL (ref 65–99)
Glucose-Capillary: 63 mg/dL — ABNORMAL LOW (ref 65–99)
Glucose-Capillary: 86 mg/dL (ref 65–99)
Glucose-Capillary: 95 mg/dL (ref 65–99)
Glucose-Capillary: 98 mg/dL (ref 65–99)

## 2015-09-21 LAB — BLOOD GAS, ARTERIAL
ALLENS TEST (PASS/FAIL): POSITIVE — AB
Acid-Base Excess: 0.3 mmol/L (ref 0.0–3.0)
Bicarbonate: 23.5 mEq/L (ref 21.0–28.0)
FIO2: 0.28
O2 SAT: 97.7 %
PCO2 ART: 33 mmHg (ref 32.0–48.0)
PO2 ART: 94 mmHg (ref 83.0–108.0)
Patient temperature: 37
pH, Arterial: 7.46 — ABNORMAL HIGH (ref 7.350–7.450)

## 2015-09-21 LAB — BASIC METABOLIC PANEL
Anion gap: 1 — ABNORMAL LOW (ref 5–15)
CALCIUM: 7.4 mg/dL — AB (ref 8.9–10.3)
CHLORIDE: 111 mmol/L (ref 101–111)
CO2: 24 mmol/L (ref 22–32)
Glucose, Bld: 106 mg/dL — ABNORMAL HIGH (ref 65–99)
Potassium: 3.7 mmol/L (ref 3.5–5.1)
SODIUM: 136 mmol/L (ref 135–145)

## 2015-09-21 LAB — PHOSPHORUS: PHOSPHORUS: 2.9 mg/dL (ref 2.5–4.6)

## 2015-09-21 LAB — MAGNESIUM: MAGNESIUM: 1.8 mg/dL (ref 1.7–2.4)

## 2015-09-21 MED ORDER — PRO-STAT SUGAR FREE PO LIQD
30.0000 mL | Freq: Every day | ORAL | Status: DC
  Administered 2015-09-21 – 2015-09-22 (×2): 30 mL via ORAL

## 2015-09-21 NOTE — Progress Notes (Signed)
[facility] [place] [facility]   PATIENT NAME: Jennifer [name]    MR#:  [id number]  DATE OF BIRTH:  1961/06/25  SUBJECTIVE:  Tolerating NG tube feedings well, appears to be more alert this morning Husband-Don-present at bedside  REVIEW OF SYSTEMS:  Unable to obtain at this time given patient's mental status medical condition  DRUG ALLERGIES:   Allergies  Allergen Reactions  . Bee Venom Anaphylaxis  . Hydrocodone Other (See Comments)    Reaction:  Muscle stiffness   . Lipitor [Atorvastatin] Other (See Comments)    Reaction:  Muscle cramps   . Shellfish Allergy Anaphylaxis  . Oxycodone Other (See Comments)    Reaction:  Muscle stiffness   . Prednisone Other (See Comments)    Reaction:  Psychosis   . Vicodin [Hydrocodone-Acetaminophen] Other (See Comments)    Reaction:  Muscle stiffness   . Oxycontin [Oxycodone Hcl] Other (See Comments)    Reaction:  Muscle stiffness   . Peanut-Containing Drug Products Diarrhea    VITALS:  Blood pressure 150/64, pulse 88, temperature 98.7 F (37.1 C), temperature source Axillary, resp. rate 18, height  (1.575 m), weight 59.829 kg (131 lb 14.4 oz), SpO2 100 %.  PHYSICAL EXAMINATION:  VITAL SIGNS: Filed Vitals:   09/20/15 2003 09/21/15 0548  BP: 145/73 150/64  Pulse: 95 88  Temp: 97.8 F (36.6 C) 98.7 F (37.1 C)  Resp: 20 18   GENERAL:55 y.o.female currently chronically ill-appearing HEAD: Normocephalic, atraumatic.  EYES: Pupils equal, round, reactive to light. Extraocular muscles intact. No scleral icterus.  MOUTH: Moist mucosal membrane. Dentition intact. No abscess noted.  EAR, NOSE, THROAT: Clear without exudates. No external lesions.  NECK: Supple. No thyromegaly. No nodules. No JVD.  PULMONARY: Diminished breath sounds secondary to effort without wheeze rails or rhonci. No use of accessory muscles, poor respiratory effort. Poor air entry bilaterally CHEST: Nontender to palpation.   CARDIOVASCULAR: S1 and S2. Regular rate and rhythm. No murmurs, rubs, or gallops. No edema. Pedal pulses 2+ bilaterally.  GASTROINTESTINAL: Soft, nontender, nondistended. No masses. Positive bowel sounds. No hepatosplenomegaly.  MUSCULOSKELETAL: No swelling, clubbing, or edema. Range of motion full in all extremities.  NEUROLOGIC: Unable to fully assess given patient's mental status medical condition SKIN: No ulceration, lesions, rashes, or cyanosis. Skin warm and dry. Turgor intact.  PSYCHIATRIC: Unable to fully assess given patient's mental status medical condition     LABORATORY PANEL:   CBC  Recent Labs Lab 09/20/15 0533  WBC 10.7  HGB 8.3*  HCT 24.4*  PLT 476*   ------------------------------------------------------------------------------------------------------------------  Chemistries   Recent Labs Lab 09/21/15 0608  NA 136  K 3.7  CL 111  CO2 24  GLUCOSE 106*  BUN <5*  CREATININE <0.30*  CALCIUM 7.4*  MG 1.8   ------------------------------------------------------------------------------------------------------------------  Cardiac Enzymes No results for input(s): TROPONINI in the last 168 hours. ------------------------------------------------------------------------------------------------------------------  RADIOLOGY:  Dg Abd 1 View  09/20/2015  CLINICAL DATA:  Nasogastric tube placement EXAM: ABDOMEN - 1 VIEW COMPARISON:  September 19, 2015 FINDINGS: Nasogastric tube tip and side port are in the stomach. The bowel gas pattern is unremarkable. No free air evident. There is atelectatic change in the lung bases. IMPRESSION: Feeding tube tip and side port in stomach. Bowel gas pattern unremarkable. Electronically Signed   By: Bretta Bang III M.D.   On: 09/20/2015 14:23    EKG:   Orders placed or performed during the hospital encounter of 09/04/15  . ED EKG 12-Lead  .  ED EKG 12-Lead  . EKG 12-Lead  . EKG 12-Lead  . EKG 12-Lead  . EKG 12-Lead     ASSESSMENT AND PLAN:  55 year old Caucasian Caucasian female history of ALS admitted 09/04/15 with altered mental status and sepsis. She accompanied hospital course thus far including biliary dependent respiratory failure successfully extubated 09/17/15  1. Acute hypoxic respiratory failure, aspiration pneumonia and sepsis, ALS.-Extubated 09/17/15 2. Acute metabolic encephalopathy secondary to sepsis.  -VRE UTI,  -Aspiration pneumonia and finished antibiotics for this.  -Clostridium difficile positive on oral vancomycin and Flagyl 3. Ileus resolved tolerating tube feeds 4. Severe malnutrition: complicated by dysphagia :This remains a major issue-continue discussion with family and patient regarding surgical gtube. They wish to hold off until Monday to see if she has meaningful recovery as they are hopeful by her more alert mental status.This is a reasonable approach continue NG tube feedings, rediscus risk of aspiration 5. ALS- bedbound.  6. Dysphagia- patient failed speech eval with both oral pharyngeal and esophageal motility issues 7. Severe malnutrition 8. Hypoglycemia- on D5NS     All the records are reviewed and case discussed with Care Management/Social Workerr. Management plans discussed with the patient, family and they are in agreement.  CODE STATUS: Full  TOTAL TIME TAKING CARE OF THIS PATIENT: 28 minutes.   POSSIBLE D/C IN 3-4 DAYS, DEPENDING ON CLINICAL CONDITION.   Maricella Filyaw,  Mardi Mainland.D on 09/21/2015 at 12:34 PM  Between 7am to 6pm - Pager - [phone]  After 6pm: House Pager: - [phone]  [facility]  Office  [phone]  CC: Primary care physician; Ruthe Mannan, MD

## 2015-09-21 NOTE — Progress Notes (Signed)
PHARMACY - MEDICATION RELATED PROGRESS NOTE  Pharmacy Consult for electrolyte management    Patient Measurements: Height:  (157.5 cm) Weight: 131 lb 14.4 oz (59.829 kg) IBW/kg (Calculated) : 50.1  Vital Signs: Temp: 98.7 F (37.1 C) (02/25 0548) BP: 150/64 mmHg (02/25 0548) Pulse Rate: 88 (02/25 0548) Intake/Output from previous day: 02/24 0701 - 02/25 0700 In: 560 [I.V.:560] Out: 650 [Urine:650] Intake/Output from this shift:   Vent settings for last 24 hours:    Labs:  Recent Labs  09/19/15 0424 09/20/15 0533 09/21/15 0608  WBC  --  10.7  --   HGB  --  8.3*  --   HCT  --  24.4*  --   PLT  --  476*  --   CREATININE 0.39* <0.30* <0.30*  MG 1.9 2.0 1.8  PHOS  --  2.2* 2.9   CrCl cannot be calculated (Patient has no serum creatinine result on file.).   Recent Labs  09/21/15 0531 09/21/15 0543 09/21/15 0755  GLUCAP 63* 72 86    Assessment: Pharmacy consulted for electrolyte management for 55 yo female with ALS and c. diff.   K & Mg WNL @ 3.7 & 1.8 respectively Phos is 2.9  Plan:  Will recheck electrolytes with AM labs tomorrow  Thank you for allowing pharmacy to be part of this patient's care.   Luisa Hart, PharmD Clinical Pharmacist   09/21/2015,8:21 AM

## 2015-09-21 NOTE — Progress Notes (Signed)
Patient unable to do NIF.

## 2015-09-21 NOTE — Progress Notes (Signed)
Pt is unable to perform NIF.

## 2015-09-21 NOTE — Progress Notes (Signed)
Nutrition Follow-up  DOCUMENTATION CODES:   Severe malnutrition in context of chronic illness  INTERVENTION:  EN: Continue enteral nutrition with plans to increase to goal rate this am of 68ml/hr.     NUTRITION DIAGNOSIS:   Inadequate oral intake related to acute illness as evidenced by NPO status.    GOAL:   Patient will meet greater than or equal to 90% of their needs    MONITOR:    (Energy intake)  REASON FOR ASSESSMENT:   Consult Enteral/tube feeding initiation and management  ASSESSMENT:      Current Nutrition: NG tube replaced and tube feeding infusing without issues per RN, Ree Kida.    Gastrointestinal Profile: Last BM: 2/24   Scheduled Medications:  . antiseptic oral rinse  7 mL Mouth Rinse QID  . buPROPion  100 mg Oral BID  . Dextromethorphan-Quinidine  1 capsule Oral BID  . enoxaparin (LOVENOX) injection  40 mg Subcutaneous Q24H  . famotidine  20 mg Oral BID  . feeding supplement (PRO-STAT SUGAR FREE 64)  30 mL Oral Daily  . free water  200 mL Per Tube 3 times per day  . lactulose  10 g Oral BID  . levETIRAcetam  500 mg Intravenous Q12H  . phenylephrine  1 suppository Rectal BID  . sodium chloride  1,000 mL Intravenous Once  . vancomycin  125 mg Oral 4 times per day    Continuous Medications:  . feeding supplement (VITAL 1.5 CAL) 1,000 mL (09/20/15 1534)     Electrolyte/Renal Profile and Glucose Profile:   Recent Labs Lab 09/17/15 0511  09/19/15 0424 09/20/15 0533 09/21/15 0608  NA 139  < > 141 144 136  K 3.4*  < > 2.8* 4.0 3.7  CL 111  < > 114* 117* 111  CO2 21*  < > BUN 8  < > <5* <5* <5*  CREATININE 0.39*  < > 0.39* <0.30* <0.30*  CALCIUM 7.7*  < > 7.3* 7.6* 7.4*  MG 1.8  < > 1.9 2.0 1.8  PHOS 3.7  --   --  2.2* 2.9  GLUCOSE 71  < > 178* 125* 106*  < > = values in this interval not displayed.    Weight Trend since Admission: Filed Weights   09/18/15 0545 09/19/15 0435 09/20/15 0500  Weight: 128 lb 4.9 oz (58.2  kg) 122 lb 12.7 oz (55.7 kg) 131 lb 14.4 oz (59.829 kg)      Diet Order:   NPO  Skin:   (stage I pressure ulcer)   Height:   Ht Readings from Last 1 Encounters:  09/11/15  (1.575 m)    Weight:   Wt Readings from Last 1 Encounters:  09/20/15 131 lb 14.4 oz (59.829 kg)    Ideal Body Weight:     BMI:  Body mass index is 24.12 kg/(m^2).  Estimated Nutritional Needs:   Kcal:  1465-1732 kcals (BEE 1110, 1.2 AF, 1.1-1.3 IF)   Protein:  64-75 g (1.1-1.3 g/kg)   Fluid:  1450-1740 mL (25-30 ml/kg)   EDUCATION NEEDS:   No education needs identified at this time  HIGH Care Level  Shamarra Warda B. Freida Busman, RD, LDN [phone] (pager) Weekend/On-Call pager [phone])

## 2015-09-21 NOTE — Progress Notes (Signed)
CC: Ileus Subjective: Patient seems more alert this morning and will nod yes or no to questions. Denies any pain on questioning. She has been started on tube feeds without any difficulty  Objective: Vital signs in last 24 hours: Temp:  [97.8 F (36.6 C)-98.7 F (37.1 C)] 98 F (36.7 C) (02/25 1316) Pulse Rate:  [87-95] 87 (02/25 1316) Resp:  [18-20] 20 (02/25 1316) BP: (145-153)/(64-91) 153/91 mmHg (02/25 1316) SpO2:  [100 %] 100 % (02/25 1316) Last BM Date: 09/20/15  Intake/Output from previous day: 02/24 0701 - 02/25 0700 In: 560 [I.V.:560] Out: 650 [Urine:650] Intake/Output this shift: Total I/O In: 1498.5 [NG/GT:1183.5; IV Piggyback:315] Out: 200 [Urine:200]  Physical exam:  Gen. no acute distress Chest: Clear to auscultation  heart: Regular rhythm Abdomen: Soft, nontender, nondistended  Lab Results: CBC   Recent Labs  09/20/15 0533  WBC 10.7  HGB 8.3*  HCT 24.4*  PLT 476*   BMET  Recent Labs  09/20/15 0533 09/21/15 0608  NA 144 136  K 4.0 3.7  CL 117* 111  CO2 23 24  GLUCOSE 125* 106*  BUN <5* <5*  CREATININE <0.30* <0.30*  CALCIUM 7.6* 7.4*   PT/INR No results for input(s): LABPROT, INR in the last 72 hours. ABG  Recent Labs  09/19/15 0903 09/21/15 0840  PHART 7.51* 7.46*  HCO3 22.3 23.5    Studies/Results: Dg Abd 1 View  09/20/2015  CLINICAL DATA:  Nasogastric tube placement EXAM: ABDOMEN - 1 VIEW COMPARISON:  September 19, 2015 FINDINGS: Nasogastric tube tip and side port are in the stomach. The bowel gas pattern is unremarkable. No free air evident. There is atelectatic change in the lung bases. IMPRESSION: Feeding tube tip and side port in stomach. Bowel gas pattern unremarkable. Electronically Signed   By: Bretta Bang III M.D.   On: 09/20/2015 14:23    Anti-infectives: Anti-infectives    Start     Dose/Rate Route Frequency Ordered Stop   09/16/15 1800  vancomycin (VANCOCIN) 500 mg in sodium chloride irrigation 0.9 % 100 mL  ENEMA  Status:  Discontinued     500 mg Rectal 4 times per day 09/16/15 1535 09/19/15 1415   09/16/15 1630  erythromycin ethylsuccinate (EES) 200 MG/5ML suspension 200 mg  Status:  Discontinued    Comments:  For GI motility   200 mg Per Tube 3 times per day 09/16/15 1629 09/18/15 1026   09/16/15 1600  metroNIDAZOLE (FLAGYL) IVPB 500 mg  Status:  Discontinued     500 mg 100 mL/hr over 60 Minutes Intravenous Every 8 hours 09/16/15 1535 09/19/15 1415   09/11/15 2200  linezolid (ZYVOX) tablet 600 mg  Status:  Discontinued     600 mg Oral Every 12 hours 09/10/15 1502 09/10/15 2216   09/11/15 2200  linezolid (ZYVOX) tablet 600 mg  Status:  Discontinued     600 mg Oral Every 12 hours 09/10/15 2314 09/10/15 2317   09/11/15 1530  piperacillin-tazobactam (ZOSYN) IVPB 3.375 g  Status:  Discontinued     3.375 g 12.5 mL/hr over 240 Minutes Intravenous 3 times per day 09/11/15 1515 09/16/15 1310   09/11/15 1445  linezolid (ZYVOX) IVPB 600 mg  Status:  Discontinued     600 mg 300 mL/hr over 60 Minutes Intravenous Every 12 hours 09/11/15 1435 09/16/15 1310   09/11/15 1000  linezolid (ZYVOX) tablet 600 mg  Status:  Discontinued     600 mg Oral Every 12 hours 09/10/15 2317 09/11/15 1435   09/10/15 2230  linezolid (ZYVOX) tablet 600 mg  Status:  Discontinued     600 mg Oral Every 12 hours 09/10/15 2216 09/10/15 2314   09/10/15 2200  linezolid (ZYVOX) tablet 600 mg  Status:  Discontinued     600 mg Oral Every 12 hours 09/10/15 1453 09/10/15 1502   09/10/15 1800  DAPTOmycin (CUBICIN) 200 mg in sodium chloride 0.9 % IVPB  Status:  Discontinued     200 mg 208 mL/hr over 30 Minutes Intravenous Every 24 hours 09/10/15 1502 09/10/15 1551   09/10/15 1445  vancomycin (VANCOCIN) 50 mg/mL oral solution 125 mg     125 mg Oral 4 times per day 09/10/15 1436     09/10/15 1445  linezolid (ZYVOX) tablet 600 mg  Status:  Discontinued     600 mg Oral Every 12 hours 09/10/15 1436 09/10/15 1453   09/09/15 2030  metroNIDAZOLE  (FLAGYL) IVPB 500 mg  Status:  Discontinued     500 mg 100 mL/hr over 60 Minutes Intravenous Every 8 hours 09/09/15 2025 09/10/15 1436   09/09/15 1800  DAPTOmycin (CUBICIN) 200 mg in sodium chloride 0.9 % IVPB  Status:  Discontinued     200 mg 208 mL/hr over 30 Minutes Intravenous Every 24 hours 09/09/15 0921 09/10/15 1436   09/09/15 1400  meropenem (MERREM) 500 mg in sodium chloride 0.9 % 50 mL IVPB  Status:  Discontinued     500 mg 100 mL/hr over 30 Minutes Intravenous 3 times per day 09/09/15 0922 09/09/15 1448   09/06/15 2200  meropenem (MERREM) 1 g in sodium chloride 0.9 % 100 mL IVPB  Status:  Discontinued     1 g 200 mL/hr over 30 Minutes Intravenous Every 12 hours 09/06/15 1850 09/09/15 0922   09/06/15 1900  DAPTOmycin (CUBICIN) 200 mg in sodium chloride 0.9 % IVPB  Status:  Discontinued     200 mg 208 mL/hr over 30 Minutes Intravenous Every 24 hours 09/06/15 1850 09/09/15 0921   09/06/15 1000  ertapenem (INVANZ) 1 g in sodium chloride 0.9 % 50 mL IVPB  Status:  Discontinued     1 g 100 mL/hr over 30 Minutes Intravenous Every 24 hours 09/04/15 2358 09/06/15 1714   09/05/15 1400  vancomycin (VANCOCIN) IVPB 750 mg/150 ml premix  Status:  Discontinued     750 mg 150 mL/hr over 60 Minutes Intravenous Every 18 hours 09/05/15 0249 09/06/15 1458   09/05/15 0300  ertapenem (INVANZ) 1 g in sodium chloride 0.9 % 50 mL IVPB     1 g 100 mL/hr over 30 Minutes Intravenous  Once 09/05/15 0253 09/05/15 0725   09/04/15 2200  piperacillin-tazobactam (ZOSYN) IVPB 3.375 g     3.375 g 100 mL/hr over 30 Minutes Intravenous  Once 09/04/15 2157 09/04/15 2243   09/04/15 2200  vancomycin (VANCOCIN) IVPB 1000 mg/200 mL premix     1,000 mg 200 mL/hr over 60 Minutes Intravenous  Once 09/04/15 2157 09/04/15 2312      Assessment/Plan:  Patient's ileus appears resolved. She is tolerating tube feeds. Please call again if surgery can be of any further assistance with this patient's care.  Angellynn Kimberlin T.  Tonita Cong, MD, FACS  09/21/2015

## 2015-09-22 LAB — BASIC METABOLIC PANEL
Anion gap: 3 — ABNORMAL LOW (ref 5–15)
BUN: 6 mg/dL (ref 6–20)
CHLORIDE: 110 mmol/L (ref 101–111)
CO2: 26 mmol/L (ref 22–32)
Calcium: 7.7 mg/dL — ABNORMAL LOW (ref 8.9–10.3)
Glucose, Bld: 103 mg/dL — ABNORMAL HIGH (ref 65–99)
Potassium: 3.9 mmol/L (ref 3.5–5.1)
SODIUM: 139 mmol/L (ref 135–145)

## 2015-09-22 LAB — GLUCOSE, CAPILLARY
GLUCOSE-CAPILLARY: 103 mg/dL — AB (ref 65–99)
Glucose-Capillary: 122 mg/dL — ABNORMAL HIGH (ref 65–99)
Glucose-Capillary: 107 mg/dL — ABNORMAL HIGH (ref 65–99)
Glucose-Capillary: 93 mg/dL (ref 65–99)
Glucose-Capillary: 86 mg/dL (ref 65–99)
Glucose-Capillary: 117 mg/dL — ABNORMAL HIGH (ref 65–99)

## 2015-09-22 NOTE — Progress Notes (Signed)
Patient is unable to do NIF

## 2015-09-22 NOTE — Progress Notes (Signed)
PHARMACY - MEDICATION RELATED PROGRESS NOTE  Pharmacy Consult for electrolyte management    Patient Measurements: Height:  (157.5 cm) Weight: 132 lb 6.4 oz (60.056 kg) IBW/kg (Calculated) : 50.1  Vital Signs: Temp: 98.1 F (36.7 C) (02/26 0618) Temp Source: Oral (02/26 0618) BP: 127/73 mmHg (02/26 0618) Pulse Rate: 79 (02/26 0618) Intake/Output from previous day: 02/25 0701 - 02/26 0700 In: 3029.5 [NG/GT:2714.5; IV Piggyback:315] Out: 651 [Urine:650; Stool:1] Intake/Output from this shift:   Vent settings for last 24 hours:    Labs:  Recent Labs  09/20/15 0533 09/21/15 0608 09/22/15 0620  WBC 10.7  --   --   HGB 8.3*  --   --   HCT 24.4*  --   --   PLT 476*  --   --   CREATININE <0.30* <0.30* <0.30*  MG 2.0 1.8  --   PHOS 2.2* 2.9  --    CrCl cannot be calculated (Patient has no serum creatinine result on file.).   Recent Labs  09/22/15 0015 09/22/15 0429 09/22/15 0731  GLUCAP 103* 122* 107*    Assessment: Pharmacy consulted for electrolyte management for 55 yo female with ALS and c. diff.   K= 3.9  Plan:  Will recheck electrolytes with AM labs tomorrow  Thank you for allowing pharmacy to be part of this patient's care.   Luisa Hart, PharmD Clinical Pharmacist   09/22/2015,8:10 AM

## 2015-09-22 NOTE — Progress Notes (Signed)
[facility] - [place] at [facility]   PATIENT NAME: Jennifer [name]    MR#:  [id number]  DATE OF BIRTH:  1961/03/03  SUBJECTIVE:  Tolerating NG tube feedings well, continues to be more alert this morning Husband-Don-present at bedside  REVIEW OF SYSTEMS:  Unable to obtain at this time given patient's mental status medical condition  DRUG ALLERGIES:   Allergies  Allergen Reactions  . Bee Venom Anaphylaxis  . Hydrocodone Other (See Comments)    Reaction:  Muscle stiffness   . Lipitor [Atorvastatin] Other (See Comments)    Reaction:  Muscle cramps   . Shellfish Allergy Anaphylaxis  . Oxycodone Other (See Comments)    Reaction:  Muscle stiffness   . Prednisone Other (See Comments)    Reaction:  Psychosis   . Vicodin [Hydrocodone-Acetaminophen] Other (See Comments)    Reaction:  Muscle stiffness   . Oxycontin [Oxycodone Hcl] Other (See Comments)    Reaction:  Muscle stiffness   . Peanut-Containing Drug Products Diarrhea    VITALS:  Blood pressure 127/73, pulse 79, temperature 98.1 F (36.7 C), temperature source Oral, resp. rate 20, height  (1.575 m), weight 60.056 kg (132 lb 6.4 oz), SpO2 100 %.  PHYSICAL EXAMINATION:  VITAL SIGNS: Filed Vitals:   09/21/15 2124 09/22/15 0618  BP: 146/72 127/73  Pulse: 103 79  Temp: 98.5 F (36.9 C) 98.1 F (36.7 C)  Resp: 24 20   GENERAL:55 y.o.female currently chronically ill-appearing HEAD: Normocephalic, atraumatic.  EYES: Pupils equal, round, reactive to light. Extraocular muscles intact. No scleral icterus.  MOUTH: Moist mucosal membrane. Dentition intact. No abscess noted.  EAR, NOSE, THROAT: Clear without exudates. No external lesions.  NECK: Supple. No thyromegaly. No nodules. No JVD.  PULMONARY: Diminished breath sounds secondary to effort without wheeze rails or rhonci. No use of accessory muscles, poor respiratory effort. Poor air entry bilaterally CHEST: Nontender to palpation.   CARDIOVASCULAR: S1 and S2. Regular rate and rhythm. No murmurs, rubs, or gallops. No edema. Pedal pulses 2+ bilaterally.  GASTROINTESTINAL: Soft, nontender, nondistended. No masses. Positive bowel sounds. No hepatosplenomegaly.  MUSCULOSKELETAL: No swelling, clubbing, or edema. Range of motion full in all extremities.  NEUROLOGIC: Unable to fully assess given patient's mental status medical condition SKIN: No ulceration, lesions, rashes, or cyanosis. Skin warm and dry. Turgor intact.  PSYCHIATRIC: Unable to fully assess given patient's mental status medical condition     LABORATORY PANEL:   CBC  Recent Labs Lab 09/20/15 0533  WBC 10.7  HGB 8.3*  HCT 24.4*  PLT 476*   ------------------------------------------------------------------------------------------------------------------  Chemistries   Recent Labs Lab 09/21/15 0608 09/22/15 0620  NA 136 139  K 3.7 3.9  CL 111 110  CO2 24 26  GLUCOSE 106* 103*  BUN <5* 6  CREATININE <0.30* <0.30*  CALCIUM 7.4* 7.7*  MG 1.8  --    ------------------------------------------------------------------------------------------------------------------  Cardiac Enzymes No results for input(s): TROPONINI in the last 168 hours. ------------------------------------------------------------------------------------------------------------------  RADIOLOGY:  Dg Abd 1 View  09/20/2015  CLINICAL DATA:  Nasogastric tube placement EXAM: ABDOMEN - 1 VIEW COMPARISON:  September 19, 2015 FINDINGS: Nasogastric tube tip and side port are in the stomach. The bowel gas pattern is unremarkable. No free air evident. There is atelectatic change in the lung bases. IMPRESSION: Feeding tube tip and side port in stomach. Bowel gas pattern unremarkable. Electronically Signed   By: Bretta Bang III M.D.   On: 09/20/2015 14:23    EKG:   Orders placed or  performed during the hospital encounter of 09/04/15  . ED EKG 12-Lead  . ED EKG 12-Lead  . EKG  12-Lead  . EKG 12-Lead  . EKG 12-Lead  . EKG 12-Lead    ASSESSMENT AND PLAN:  55 year old Caucasian female history of ALS admitted 09/04/15 with altered mental status and sepsis. She accompanied hospital course thus far including biliary dependent respiratory failure successfully extubated 09/17/15  1. Acute hypoxic respiratory failure, aspiration pneumonia and sepsis, ALS.-Extubated 09/17/15 2. Acute metabolic encephalopathy secondary to sepsis.  -VRE UTI,  -Aspiration pneumonia and finished antibiotics for this.  -Clostridium difficile positive on oral vancomycin -stop date 09/30/15 3. Ileus resolved tolerating tube feeds 4. Severe malnutrition: complicated by dysphagia :This remains a major issue-continue discussion with family and patient regarding surgical gtube. They wish to hold off until Monday to see if she has meaningful recovery. no speech reevaluate as she has made some improvement still remain doubtful she will be able to meet all nutritional requirements 5. ALS- bedbound.  6. Pressure ulcer: Documented on admission, wound therapy     All the records are reviewed and case discussed with Care Management/Social Workerr. Management plans discussed with the patient, family and they are in agreement.  CODE STATUS: Full  TOTAL TIME TAKING CARE OF THIS PATIENT: 28 minutes.   POSSIBLE D/C IN 3-4 DAYS, DEPENDING ON CLINICAL CONDITION.   Aarion Metzgar,  Mardi Mainland.D on 09/22/2015 at 1:05 PM  Between 7am to 6pm - Pager - [phone]  After 6pm: House Pager: - [phone]  [facility]  Office  [phone]  CC: Primary care physician; Ruthe Mannan, MD

## 2015-09-23 ENCOUNTER — Inpatient Hospital Stay: Payer: BLUE CROSS/BLUE SHIELD

## 2015-09-23 LAB — CBC
HCT: 23.2 % — ABNORMAL LOW (ref 35.0–47.0)
Hemoglobin: 7.9 g/dL — ABNORMAL LOW (ref 12.0–16.0)
MCH: 31.3 pg (ref 26.0–34.0)
MCHC: 34.1 g/dL (ref 32.0–36.0)
MCV: 91.8 fL (ref 80.0–100.0)
Platelets: 339 10*3/uL (ref 150–440)
RBC: 2.53 MIL/uL — ABNORMAL LOW (ref 3.80–5.20)
RDW: 16.3 % — AB (ref 11.5–14.5)
WBC: 7.8 10*3/uL (ref 3.6–11.0)

## 2015-09-23 LAB — COMPREHENSIVE METABOLIC PANEL
ALT: 15 U/L (ref 14–54)
AST: 23 U/L (ref 15–41)
Albumin: 1.9 g/dL — ABNORMAL LOW (ref 3.5–5.0)
Alkaline Phosphatase: 40 U/L (ref 38–126)
Anion gap: 4 — ABNORMAL LOW (ref 5–15)
BUN: 7 mg/dL (ref 6–20)
CO2: 26 mmol/L (ref 22–32)
Calcium: 7.6 mg/dL — ABNORMAL LOW (ref 8.9–10.3)
Chloride: 107 mmol/L (ref 101–111)
Creatinine, Ser: <0.3 mg/dL — ABNORMAL LOW (ref 0.44–1.00)
Glucose, Bld: 115 mg/dL — ABNORMAL HIGH (ref 65–99)
Potassium: 3.3 mmol/L — ABNORMAL LOW (ref 3.5–5.1)
Sodium: 137 mmol/L (ref 135–145)
Total Bilirubin: 0.2 mg/dL — ABNORMAL LOW (ref 0.3–1.2)
Total Protein: 4.1 g/dL — ABNORMAL LOW (ref 6.5–8.1)

## 2015-09-23 LAB — PHOSPHORUS: PHOSPHORUS: 2.9 mg/dL (ref 2.5–4.6)

## 2015-09-23 LAB — GLUCOSE, CAPILLARY
GLUCOSE-CAPILLARY: 106 mg/dL — AB (ref 65–99)
Glucose-Capillary: 101 mg/dL — ABNORMAL HIGH (ref 65–99)
Glucose-Capillary: 99 mg/dL (ref 65–99)
Glucose-Capillary: 83 mg/dL (ref 65–99)

## 2015-09-23 LAB — MAGNESIUM: MAGNESIUM: 1.6 mg/dL — AB (ref 1.7–2.4)

## 2015-09-23 MED ORDER — POTASSIUM CHLORIDE 10 MEQ/100ML IV SOLN
10.0000 meq | INTRAVENOUS | Status: AC
  Administered 2015-09-23 (×4): 10 meq via INTRAVENOUS
  Filled 2015-09-23 (×4): qty 100

## 2015-09-23 MED ORDER — HALOPERIDOL 0.5 MG PO TABS: 0.5000 mg | ORAL_TABLET | ORAL | Status: DC | PRN

## 2015-09-23 MED ORDER — MAGNESIUM SULFATE 4 GM/100ML IV SOLN
4.0000 g | Freq: Once | INTRAVENOUS | Status: AC
  Administered 2015-09-23: 4 g via INTRAVENOUS
  Filled 2015-09-23: qty 100

## 2015-09-23 MED ORDER — VANCOMYCIN 50 MG/ML ORAL SOLUTION
125.0000 mg | Freq: Four times a day (QID) | ORAL | Status: DC
  Administered 2015-09-23 – 2015-09-24 (×4): 125 mg via ORAL
  Filled 2015-09-23 (×7): qty 2.5

## 2015-09-23 MED ORDER — HALOPERIDOL LACTATE 2 MG/ML PO CONC: 0.5000 mg | ORAL | Status: DC | PRN

## 2015-09-23 MED ORDER — HALOPERIDOL LACTATE 5 MG/ML IJ SOLN: 0.5000 mg | INTRAMUSCULAR | Status: DC | PRN

## 2015-09-23 MED ORDER — LEVETIRACETAM 100 MG/ML PO SOLN
500.0000 mg | Freq: Two times a day (BID) | ORAL | Status: DC
  Administered 2015-09-23 – 2015-09-24 (×2): 500 mg via ORAL
  Filled 2015-09-23 (×5): qty 5

## 2015-09-23 NOTE — Progress Notes (Signed)
[facility] - [place] at [facility]   PATIENT NAME: Jennifer [name]    MR#:  [id number]  DATE OF BIRTH:  September 03, 1960  SUBJECTIVE:  Tolerating NG tube feedings well, husband done at bedside Fever this morning no further complaints  REVIEW OF SYSTEMS:  Unable to obtain at this time given patient's mental status medical condition  DRUG ALLERGIES:   Allergies  Allergen Reactions  . Bee Venom Anaphylaxis  . Hydrocodone Other (See Comments)    Reaction:  Muscle stiffness   . Lipitor [Atorvastatin] Other (See Comments)    Reaction:  Muscle cramps   . Shellfish Allergy Anaphylaxis  . Oxycodone Other (See Comments)    Reaction:  Muscle stiffness   . Prednisone Other (See Comments)    Reaction:  Psychosis   . Vicodin [Hydrocodone-Acetaminophen] Other (See Comments)    Reaction:  Muscle stiffness   . Oxycontin [Oxycodone Hcl] Other (See Comments)    Reaction:  Muscle stiffness   . Peanut-Containing Drug Products Diarrhea    VITALS:  Blood pressure 134/59, pulse 110, temperature 101.8 F (38.8 C), temperature source Oral, resp. rate 16, height  (1.575 m), weight 59.285 kg (130 lb 11.2 oz), SpO2 100 %.  PHYSICAL EXAMINATION:  VITAL SIGNS: Filed Vitals:   09/23/15 0808 09/23/15 0833  BP:    Pulse:    Temp: 101.5 F (38.6 C) 101.8 F (38.8 C)  Resp:     GENERAL:54 y.o.female currently chronically ill-appearing HEAD: Normocephalic, atraumatic.  EYES: Pupils equal, round, reactive to light. Extraocular muscles intact. No scleral icterus.  MOUTH: Moist mucosal membrane. Dentition intact. No abscess noted.  EAR, NOSE, THROAT: Clear without exudates. No external lesions.  NECK: Supple. No thyromegaly. No nodules. No JVD.  PULMONARY: Diminished breath sounds secondary to effort without wheeze rails or rhonci. No use of accessory muscles, poor respiratory effort. Poor air entry bilaterally CHEST: Nontender to palpation.  CARDIOVASCULAR: S1 and S2.  Regular rate and rhythm. No murmurs, rubs, or gallops. No edema. Pedal pulses 2+ bilaterally.  GASTROINTESTINAL: Soft, nontender, nondistended. No masses. Positive bowel sounds. No hepatosplenomegaly.  MUSCULOSKELETAL: No swelling, clubbing, or edema. Passive Range of motion full in all extremities.  NEUROLOGIC: Unable to fully assess given patient's mental status medical condition SKIN: No ulceration, lesions, rashes, or cyanosis. Skin warm and dry. Turgor intact.  PSYCHIATRIC: Unable to fully assess given patient's mental status medical condition     LABORATORY PANEL:   CBC  Recent Labs Lab 09/23/15 0609  WBC 7.8  HGB 7.9*  HCT 23.2*  PLT 339   ------------------------------------------------------------------------------------------------------------------  Chemistries   Recent Labs Lab 09/23/15 0609  NA 137  K 3.3*  CL 107  CO2 26  GLUCOSE 115*  BUN 7  CREATININE <0.30*  CALCIUM 7.6*  MG 1.6*  AST 23  ALT 15  ALKPHOS 40  BILITOT 0.2*   ------------------------------------------------------------------------------------------------------------------  Cardiac Enzymes No results for input(s): TROPONINI in the last 168 hours. ------------------------------------------------------------------------------------------------------------------  RADIOLOGY:  Dg Chest Port 1 View  09/23/2015  CLINICAL DATA:  Shortness of breath and fever. Aspiration. Inpatient. EXAM: PORTABLE CHEST 1 VIEW COMPARISON:  09/16/2015 chest radiograph. FINDINGS: Left internal jugular central venous catheter terminates in the lower third of the superior vena cava. Very low lung volumes. Stable cardiomediastinal silhouette with normal heart size. No pneumothorax. Small bilateral pleural effusions, right greater than left, increased bilaterally. No overt pulmonary edema. Patchy and curvilinear bibasilar lung opacities, increased on the right. Oral contrast is seen in the gastric  fundus. IMPRESSION:  1. Very low lung volumes. Patchy and curvilinear bibasilar lung opacities, increased on the right, likely atelectasis, cannot exclude a component of aspiration or pneumonia. 2. Small bilateral pleural effusions, right greater the left, increased bilaterally. Electronically Signed   By: Delbert Phenix M.D.   On: 09/23/2015 11:14    EKG:   Orders placed or performed during the hospital encounter of 09/04/15  . ED EKG 12-Lead  . ED EKG 12-Lead  . EKG 12-Lead  . EKG 12-Lead  . EKG 12-Lead  . EKG 12-Lead    ASSESSMENT AND PLAN:  55 year old Caucasian female history of ALS admitted 09/04/15 with altered mental status and sepsis. She accompanied hospital course thus far including biliary dependent respiratory failure successfully extubated 09/17/15  1. Fever: We'll get urinalysis check chest x-ray, blood cultures and insertion for further etiology patient currently on oral vancomycin has been followed extensively by infectious disease in the past if he continues to be febrile will reconsult. 1. Acute hypoxic respiratory failure, aspiration pneumonia and sepsis, ALS.-Extubated 09/17/15 2. Acute metabolic encephalopathy secondary to sepsis.  -VRE UTI,  -Aspiration pneumonia and finished antibiotics for this.  -Clostridium difficile positive on oral vancomycin -stop date 09/30/15 3. Ileus resolved tolerating tube feeds 4. Severe malnutrition: complicated by dysphagia : She has had a repeat speech evaluation and will be able to tolerate pured diet and nectar thick liquids, discussed with husband still high possibility of aspiration she will be unlikely to meet all nutritional requirements. With this information he does not wish to pursue surgical feeding tube at this time and continue with oral intake 5. ALS- bedbound.  6. Pressure ulcer: Documented on admission, wound therapy  Disposition: Now the nutrition standpoint is taking care of his afebrile can be discharged tomorrow   All the records are  reviewed and case discussed with Care Management/Social Workerr. Management plans discussed with the patient, family and they are in agreement.  CODE STATUS: Full  TOTAL TIME TAKING CARE OF THIS PATIENT: 33 minutes.   POSSIBLE D/C IN 1-2 DAYS, DEPENDING ON CLINICAL CONDITION.   Mayur Duman,  Mardi Mainland.D on 09/23/2015 at 1:43 PM  Between 7am to 6pm - Pager - [phone]  After 6pm: House Pager: - [phone]  [facility]  Office  [phone]  CC: Primary care physician; Ruthe Mannan, MD

## 2015-09-23 NOTE — Progress Notes (Signed)
Pt. Unable to perform NIF at this time.

## 2015-09-23 NOTE — Care Management (Signed)
Anticipate discharge to home tomorrow.  Patient had MBS, and husband has declined surgical intervention for PEG tube.  Patient open with [facility].  Will need  Resumption of care orders.  Patient on acute O2.  RN to wean.  Speech to deliver thickened liquids prior to discharge.  Husband states that he private pays for a care giver 7 days a week, 8 hours a day however his caregiver is sick.  Will provide husband with PCS list.  Husband states that if he is unable to arrange sitter services he will stay with her after discharge.  RNCM following

## 2015-09-23 NOTE — Progress Notes (Signed)
Nutrition Follow-up  DOCUMENTATION CODES:   Severe malnutrition in context of chronic illness  INTERVENTION:  Meals and snacks: SLP able to place pt on po diet following modified this am Medical Nutrition Supplement Therapy: Recommend adding mightyshake (nectar thick) TID and magic cup BID for added nutrition   NUTRITION DIAGNOSIS:   Inadequate oral intake related to acute illness as evidenced by NPO status.    GOAL:   Patient will meet greater than or equal to 90% of their needs    MONITOR:    (Energy intake)  REASON FOR ASSESSMENT:   Consult Enteral/tube feeding initiation and management  ASSESSMENT:      Pt s/p MBSS this am.  NG tube removed and tube feeding stopped    Gastrointestinal Profile: Last BM: 2/26   Scheduled Medications:  . antiseptic oral rinse  7 mL Mouth Rinse QID  . buPROPion  100 mg Oral BID  . Dextromethorphan-Quinidine  1 capsule Oral BID  . enoxaparin (LOVENOX) injection  40 mg Subcutaneous Q24H  . famotidine  20 mg Oral BID  . lactulose  10 g Oral BID  . levETIRAcetam  500 mg Intravenous Q12H  . magnesium sulfate 1 - 4 g bolus IVPB  4 g Intravenous Once  . phenylephrine  1 suppository Rectal BID  . sodium chloride  1,000 mL Intravenous Once  . vancomycin  125 mg Oral 4 times per day       Electrolyte/Renal Profile and Glucose Profile:   Recent Labs Lab 09/20/15 0533 09/21/15 0608 09/22/15 0620 09/23/15 0609  NA 144 136 139 137  K 4.0 3.7 3.9 3.3*  CL 117* 111 110 107  CO2 BUN <5* <5* 6 7  CREATININE <0.30* <0.30* <0.30* <0.30*  CALCIUM 7.6* 7.4* 7.7* 7.6*  MG 2.0 1.8  --  1.6*  PHOS 2.2* 2.9  --  2.9  GLUCOSE 125* 106* 103* 115*   Protein Profile:  Recent Labs Lab 09/23/15 0609  ALBUMIN 1.9*      Weight Trend since Admission: Filed Weights   09/20/15 0500 09/22/15 0500 09/23/15 0500  Weight: 131 lb 14.4 oz (59.829 kg) 132 lb 6.4 oz (60.056 kg) 130 lb 11.2 oz (59.285 kg)      Diet  Order:  DIET - DYS 1 Room service appropriate?: Yes with Assist; Fluid consistency:: Nectar Thick  Skin:   (stage I pressure ulcer)   Height:   Ht Readings from Last 1 Encounters:  09/11/15  (1.575 m)    Weight:   Wt Readings from Last 1 Encounters:  09/23/15 130 lb 11.2 oz (59.285 kg)    Ideal Body Weight:     BMI:  Body mass index is 23.9 kg/(m^2).  Estimated Nutritional Needs:   Kcal:  1465-1732 kcals (BEE 1110, 1.2 AF, 1.1-1.3 IF)   Protein:  64-75 g (1.1-1.3 g/kg)   Fluid:  1450-1740 mL (25-30 ml/kg)   EDUCATION NEEDS:   No education needs identified at this time  HIGH Care Level  Maritza Hosterman B. Freida Busman, RD, LDN [phone] (pager) Weekend/On-Call pager [phone])

## 2015-09-23 NOTE — Evaluation (Addendum)
Objective Swallowing Evaluation: Type of Study: MBS-Modified Barium Swallow Study  Patient Details  Name: Jennifer [name] MRN: [id number] Date of Birth: [birth year]  Today's Date: 09/23/2015 Time: SLP Start Time (ACUTE ONLY): 0915-SLP Stop Time (ACUTE ONLY): 1015 SLP Time Calculation (min) (ACUTE ONLY): 60 min  Past Medical History:  Past Medical History  Diagnosis Date  . ALS (amyotrophic lateral sclerosis) (HCC)   . Depression   . GERD (gastroesophageal reflux disease)   . Urinary retention   . Dysphagia, oropharyngeal 11/28/2013  . CN (constipation) 11/28/2013   Past Surgical History:  Past Surgical History  Procedure Laterality Date  . Cesarean section    . Nasal sinus surgery     HPI: Pt is a 55 y.o. female with a known history of ALS, chronic Foley catheter is brought to the emergency room by husband due to hallucinations, weakness and confusion. Patient does have psychiatric issues and generally gets agitated due to those. admitted for UTI with sepsis and acute encephalopathy; pt has had UTIs before per report. During this admission, pt was orally intubated for ~1 week d/t declined respiratory status; MD was concerned for aspiration. MBSS on 09/06/15 revealed concern for high risk of prandial aspiration sec. to moderate-severe oropharyngeal phase dysphagia as well as risk for aspiration of Reflux d/t moderate-severe Esophageal phase deficits per the MBSS. Due to pt's declined medical status and weakness overall from this extended hospitalization including oral intubation on vent for ~1 week (and w/ consideration of impact of ALS), pt is at increased risk for aspriation of po's. Primary MD has discussed this w/ husband who requests another MBSS prior to consideration of a PEG placement d/t pt "feeling stronger now". Of note, NSG removed pt's NG tube in preparation for this repeat MBSS today.   Subjective: pt awake, effortful respirations noted but no increased RR noted. NSG removed pt's NG  tube for this study per MD order.   Objective:  Radiological Procedure: A videoflouroscopic evaluation of oral-preparatory, reflex initiation, and pharyngeal phases of the swallow was performed; as well as a screening of the upper esophageal phase.  I. POSTURE: upright II. VIEW: lateral III. COMPENSATORY STRATEGIES: time b/t bolus trials; TSP presentation of liquids and puree IV. BOLUSES ADMINISTERED:  Thin Liquid: 1 TSP trial  Nectar-thick Liquid: 6 TSP trials  Honey-thick Liquid: NT  Puree: 5 TSP trials  Mechanical Soft: NT V. RESULTS OF EVALUATION: A. ORAL PREPARATORY PHASE: (The lips, tongue, and velum are observed for strength and coordination)       **Overall Severity Rating: grossly WFL; adequate oral control for timely A-P transfer; adequate lingual/labial strength and ROM for receiving bolus and for oral clearing. Of note, a delay in transfer and swallow occurred as po trials continued - at the same time Esophageal retrograde activity was noted.   B. SWALLOW INITIATION/REFLEX: (The reflex is normal if "triggered" by the time the bolus reached the base of the tongue)  **Overall Severity Rating: MODERATE; delayed pharyngeal swallow initiation w/ liquids w/ thin liquids spilling into the pyriform sinuses b/f pharyngeal swallow was initiated(laryngeal penetration occurred). TSP trials of the Nectar liquids spilled from the valleculae to the pyriform sinuses as the pharyngeal swallow was initiated; similar w/ the puree trials.    C. PHARYNGEAL PHASE: (Pharyngeal function is normal if the bolus shows rapid, smooth, and continuous transit through the pharynx and there is no pharyngeal residue after the swallow)  **Overall Severity Rating: grossly WFL; no immediate pharyngeal residue noted w/ initial bolus trials; no buildup  of bolus residue as trials continued. Intermittent, min. valleculae residue was noted during last 1-2 trials as the Esophageal residue was noted.    D. LARYNGEAL  PENETRATION: (Material entering into the laryngeal inlet/vestibule but not aspirated): x1 w/ TSP of thin liquids (DEEP) E. ASPIRATION: none F. ESOPHAGEAL PHASE: (Screening of the upper esophagus): retrograde activity of bolus material was noted from the lower Esophagus(out of view) superior to the level of the lower, Cervical Esophagus - below the shadow of the shoulders. It was particularly noted when pt took deep breaths.   ASSESSMENT: Pt appears to present w/ minimally improved oropharyngeal swallow function from previous swallow study but continues to present w/ Moderate pharyngeal phase dysphagia and Esophageal phase dysmotility/dysphagia w/ potential impact on the timing of the Oral phase during A-P transfer and swallow. Pt's delayed pharyngeal swallow initiation greatly increases risk for aspiration; deep laryngeal penetration was noted w/ TSP presentation of thin liquids x1 during this study. Pt exhibited best oropharyngeal control for timing of transfer and swallow initiation w/ TSP trials of Nectar liquids and purees. However, d/t pt's increased Respiratory effort/rate w/ any exertion, as well as pt's Esophageal phase dysmotility/dysphagia, pt is at increased risk for both prandial aspiration and aspiration of Reflux material.  Results and recommendations were discussed w/ MD post study to include discussion for alternative means of feeding for nutrition/hydration needs(PEG) w/ consideration of pleasure po's w/ strict aspiration and Reflux precautions in light of pt's baseline dx of ALS and its impact overall. MD to discuss w/ pt and husband. ST will be available for further education as needed.  PLAN/RECOMMENDATIONS:  A. Diet: TBD by MD - would rec. Pleasure po's of Nectar consistency liquids, puree (ALL by TSP)  B. Swallowing Precautions: strict aspiration precautions including all po's by TSP, feeding assistance, feed slowly, rest breaks frequently during po's to allow for Esophageal  clearing  C. Recommended consultation to: GI; dietician  D. Therapy recommendations: education. Rec. f/u at [facility] where pt is followed   E. Results and recommendations were discussed w/ MD, NSG      CHL IP TREATMENT RECOMMENDATION 09/23/2015  Treatment Recommendations Therapy as outlined in treatment plan below     Prognosis 09/23/2015  Prognosis for Safe Diet Advancement Guarded  Barriers to Reach Goals Severity of deficits  Barriers/Prognosis Comment progressive nature of dis.    CHL IP DIET RECOMMENDATION 09/23/2015  SLP Diet Recommendations Alternative means - long-term;Ice chips PRN after oral care;Dysphagia 1 (Puree) solids;Nectar thick liquid  Liquid Administration via Spoon  Medication Administration Via alternative means  Compensations Minimize environmental distractions;Slow rate;Small sips/bites;Follow solids with liquid  Postural Changes Seated upright at 90 degrees;Remain semi-upright after after feeds/meals (Comment)      CHL IP OTHER RECOMMENDATIONS 09/23/2015  Recommended Consults Consider GI evaluation  Oral Care Recommendations Staff/trained caregiver to provide oral care;Oral care BID  Other Recommendations Order thickener from pharmacy;Prohibited food (jello, ice cream, thin soups);Remove water pitcher      CHL IP FOLLOW UP RECOMMENDATIONS 09/23/2015  Follow up Recommendations Skilled Nursing facility;Home health SLP      CHL IP FREQUENCY AND DURATION 09/23/2015  Speech Therapy Frequency (ACUTE ONLY) min 3x week  Treatment Duration 2 weeks                  CHL IP GO 01/01/2015  Functional Assessment Tool Used (No Data)  Functional Limitations Swallowing  Swallow Current Status (H4765) CJ  Swallow Goal Status (Y6503) CJ  Swallow Discharge Status (  Dabnie.Chihuahua) CJ  Motor Speech Current Status [phone]) (None)  Motor Speech Goal Status [phone]) (None)  Motor Speech Goal Status [phone]) (None)  Spoken Language Comprehension Current Status [phone])  (None)  Spoken Language Comprehension Goal Status (G9562) (None)  Spoken Language Comprehension Discharge Status [phone]) (None)  Spoken Language Expression Current Status [phone]) (None)  Spoken Language Expression Goal Status [phone]) (None)  Spoken Language Expression Discharge Status [phone]) (None)  Attention Current Status (K4401) (None)  Attention Goal Status (U2725) (None)  Attention Discharge Status [phone]) (None)  Memory Current Status (I3474) (None)  Memory Goal Status (Q5956) (None)  Memory Discharge Status (L8756) (None)  Voice Current Status (E3329) (None)  Voice Goal Status (J1884) (None)  Voice Discharge Status (Z6606) (None)  Other Speech-Language Pathology Functional Limitation [phone]) (None)  Other Speech-Language Pathology Functional Limitation Goal Status (F0932) (None)  Other Speech-Language Pathology Functional Limitation Discharge Status [phone]) (None)        Jerilynn Som, MS, CCC-SLP  [name] 09/23/2015, 11:56 AM

## 2015-09-23 NOTE — Care Management (Signed)
PCS service list left at bedside for husband

## 2015-09-23 NOTE — Progress Notes (Signed)
PHARMACY - MEDICATION RELATED PROGRESS NOTE  Pharmacy Consult for electrolyte management    Patient Measurements: Height:  (157.5 cm) Weight: 130 lb 11.2 oz (59.285 kg) IBW/kg (Calculated) : 50.1  Vital Signs: Temp: 99.1 F (37.3 C) (02/27 0602) Temp Source: Oral (02/27 0602) BP: 134/59 mmHg (02/27 0602) Pulse Rate: 110 (02/27 0602) Intake/Output from previous day: 02/26 0701 - 02/27 0700 In: 1577.7 [NG/GT:1367.7; IV Piggyback:210] Out: 1501 [Urine:1500; Stool:1] Intake/Output from this shift:   Vent settings for last 24 hours:    Labs:  Recent Labs  09/21/15 0608 09/22/15 0620 09/23/15 0609  WBC  --   --  7.8  HGB  --   --  7.9*  HCT  --   --  23.2*  PLT  --   --  339  CREATININE <0.30* <0.30* <0.30*  MG 1.8  --  1.6*  PHOS 2.9  --  2.9  ALBUMIN  --   --  1.9*  PROT  --   --  4.1*  AST  --   --  23  ALT  --   --  15  ALKPHOS  --   --  40  BILITOT  --   --  0.2*   CrCl cannot be calculated (Patient has no serum creatinine result on file.).  Lab Results  Component Value Date   K 3.3* 09/23/2015    Recent Labs  09/22/15 2149 09/23/15 0106 09/23/15 0732  GLUCAP 117* 101* 99    Assessment: Pharmacy consulted for electrolyte management for 55 yo female with ALS and c. Diff on Tube feeds, aspiration PNA  K= 3.3   Mag= 1.6  Plan:  KCL IV Ordered by MD. Will order Magnesium sulfate 4 gram IV x 1. Will recheck electrolytes with AM labs tomorrow  Thank you for allowing pharmacy to be part of this patient's care.   Bari Mantis PharmD Clinical Pharmacist 09/23/2015

## 2015-09-24 LAB — BASIC METABOLIC PANEL
Anion gap: 2 — ABNORMAL LOW (ref 5–15)
BUN: 5 mg/dL — ABNORMAL LOW (ref 6–20)
CALCIUM: 7.4 mg/dL — AB (ref 8.9–10.3)
CO2: 29 mmol/L (ref 22–32)
Chloride: 108 mmol/L (ref 101–111)
GLUCOSE: 87 mg/dL (ref 65–99)
Potassium: 3.6 mmol/L (ref 3.5–5.1)
Sodium: 139 mmol/L (ref 135–145)

## 2015-09-24 LAB — VIRUS CULTURE

## 2015-09-24 LAB — CBC
HCT: 22.7 % — ABNORMAL LOW (ref 35.0–47.0)
Hemoglobin: 7.6 g/dL — ABNORMAL LOW (ref 12.0–16.0)
MCH: 31.5 pg (ref 26.0–34.0)
MCHC: 33.5 g/dL (ref 32.0–36.0)
MCV: 94.1 fL (ref 80.0–100.0)
PLATELETS: 265 10*3/uL (ref 150–440)
RBC: 2.41 MIL/uL — ABNORMAL LOW (ref 3.80–5.20)
RDW: 16.5 % — AB (ref 11.5–14.5)
WBC: 3.2 10*3/uL — ABNORMAL LOW (ref 3.6–11.0)

## 2015-09-24 LAB — MAGNESIUM: Magnesium: 2.1 mg/dL (ref 1.7–2.4)

## 2015-09-24 MED ORDER — VANCOMYCIN 50 MG/ML ORAL SOLUTION: 125.0000 mg | Freq: Four times a day (QID) | ORAL | Status: DC

## 2015-09-24 NOTE — Progress Notes (Signed)
Patient performed NIF =  - 25cmH2O

## 2015-09-24 NOTE — Discharge Summary (Signed)
[facility] - [facility]   PATIENT NAME: Jennifer [name]    MR#:  [id number]  DATE OF BIRTH:  1960/09/18  DATE OF ADMISSION:  09/04/2015 ADMITTING PHYSICIAN: Milagros Loll, MD  DATE OF DISCHARGE: No discharge date for patient encounter.  PRIMARY CARE PHYSICIAN: Ruthe Mannan, MD    ADMISSION DIAGNOSIS:  UTI (lower urinary tract infection) [N39.0] Sepsis, due to unspecified organism (HCC) [A41.9]  DISCHARGE DIAGNOSIS:  Sepsis resolved Urinary tract infection resolved Aspiration pneumonia resolved  Acute on chronic respiratory failure with hypoxia resolved Pressure ulcer Aspiration risk Severe protein malnutrition  SECONDARY DIAGNOSIS:   Past Medical History  Diagnosis Date  . ALS (amyotrophic lateral sclerosis) (HCC)   . Depression   . GERD (gastroesophageal reflux disease)   . Urinary retention   . Dysphagia, oropharyngeal 11/28/2013  . CN (constipation) 11/28/2013    HOSPITAL COURSE:  Jennifer [name]  is a 55 y.o. female admitted 09/04/2015 with chief complaint of altered mental status. Please see H&P performed by Dr. Elpidio Anis for further information. On admission she is on dopamine septic criteria secondary to urinary tract infection started on broad antibiotics given history of ESBL organisms including vancomycin and Invanz. She continued to be febrile during the early stages of her course antibiotics adjusted to daptomycin and meropenem. Her course was further complicated by C. difficile infection documented 09/10/2015 for which she started on oral vancomycin. 09/11/15 her course was again uncomplicated by her respiratory distress requiring intubation and mechanical ventilation. She had a prolonged course in the intensive care unit and was successfully extubated 09/17/15. Since that time she has been making slow progressions. The issue that remains at the end of her hospitalization was the issue and concern of aspiration risk she originally failed  swallow study evaluation requiring NG tube however after 2-3 days feeding and improving strength she was able to tolerate nectar thick liquids and pured solids by teaspoon. Discussed with patient and husband general concern to be unable to meet nutritional requirements at this time did not wish to pursue surgical feeding tubes.  DISCHARGE CONDITIONS:   Stable/improved  CONSULTS OBTAINED:  Treatment Team:  Ramonita Lab, MD  DRUG ALLERGIES:   Allergies  Allergen Reactions  . Bee Venom Anaphylaxis  . Hydrocodone Other (See Comments)    Reaction:  Muscle stiffness   . Lipitor [Atorvastatin] Other (See Comments)    Reaction:  Muscle cramps   . Shellfish Allergy Anaphylaxis  . Oxycodone Other (See Comments)    Reaction:  Muscle stiffness   . Prednisone Other (See Comments)    Reaction:  Psychosis   . Vicodin [Hydrocodone-Acetaminophen] Other (See Comments)    Reaction:  Muscle stiffness   . Oxycontin [Oxycodone Hcl] Other (See Comments)    Reaction:  Muscle stiffness   . Peanut-Containing Drug Products Diarrhea    DISCHARGE MEDICATIONS:   Current Discharge Medication List    START taking these medications   Details  vancomycin (VANCOCIN) 50 mg/mL oral solution Take 2.5 mLs (125 mg total) by mouth every 6 (six) hours. Qty: 70 mL, Refills: 0      CONTINUE these medications which have CHANGED   Details  lactulose (CHRONULAC) 10 GM/15ML solution Take 15 mLs (10 g total) by mouth 3 (three) times daily as needed for mild constipation. Qty: 240 mL, Refills: 0      CONTINUE these medications which have NOT CHANGED   Details  ARIPiprazole (ABILIFY) 5 MG tablet TAKE 1 TABLET (5 MG TOTAL) BY MOUTH  DAILY. Qty: 30 tablet, Refills: 0    atropine 1 % ophthalmic solution Place 2 drops under the tongue as needed (for excessive secretions).    buPROPion (WELLBUTRIN SR) 150 MG 12 hr tablet Take 300 mg by mouth 2 (two) times daily.    cholecalciferol (VITAMIN D) 1000 UNITS tablet Take  1,000 Units by mouth daily.    Dextromethorphan-Quinidine (NUEDEXTA) 20-10 MG CAPS Take 1 capsule by mouth 2 (two) times daily.     diazepam (VALIUM) 10 MG tablet Take 1 tablet (10 mg total) by mouth every 6 (six) hours as needed. Qty: 90 tablet, Refills: 0    fluticasone (FLONASE) 50 MCG/ACT nasal spray Place 2 sprays into both nostrils daily as needed for rhinitis.    fosfomycin (MONUROL) 3 G PACK Take 3 g by mouth every other day. Qty: 15 g, Refills: 0    ibuprofen (ADVIL,MOTRIN) 200 MG tablet Take 800 mg by mouth 3 (three) times daily as needed for mild pain.     levETIRAcetam (KEPPRA) 500 MG tablet Take 500 mg by mouth 2 (two) times daily.     loratadine (CLARITIN) 10 MG tablet Take 10 mg by mouth daily as needed for allergies.     omeprazole (PRILOSEC) 40 MG capsule Take 40 mg by mouth daily.     potassium chloride 20 MEQ/15ML (10%) SOLN Take 20 mEq by mouth daily.    ranitidine (ZANTAC) 150 MG tablet Take 150 mg by mouth daily as needed for heartburn.    tizanidine (ZANAFLEX) 2 MG capsule Take 2 mg by mouth at bedtime.       STOP taking these medications     ciprofloxacin (CIPRO) 500 MG tablet          DISCHARGE INSTRUCTIONS:  Continue oral vancomycin for Clostridium difficile infection stop date 09/30/2015   DIET:  Pured solid, nectar thickened liquid by teaspoon as tolerated-strict aspiration precaution  DISCHARGE CONDITION:  Stable  ACTIVITY:  Activity as tolerated  OXYGEN:  Home Oxygen: No.   Oxygen Delivery: room air  DISCHARGE LOCATION:  home   If you experience worsening of your admission symptoms, develop shortness of breath, life threatening emergency, suicidal or homicidal thoughts you must seek medical attention immediately by calling 911 or calling your MD immediately  if symptoms less severe.  You Must read complete instructions/literature along with all the possible adverse reactions/side effects for all the Medicines you take and that  have been prescribed to you. Take any new Medicines after you have completely understood and accpet all the possible adverse reactions/side effects.   Please note  You were cared for by a hospitalist during your hospital stay. If you have any questions about your discharge medications or the care you received while you were in the hospital after you are discharged, you can call the unit and asked to speak with the hospitalist on call if the hospitalist that took care of you is not available. Once you are discharged, your primary care physician will handle any further medical issues. Please note that NO REFILLS for any discharge medications will be authorized once you are discharged, as it is imperative that you return to your primary care physician (or establish a relationship with a primary care physician if you do not have one) for your aftercare needs so that they can reassess your need for medications and monitor your lab values.    On the day of Discharge:   VITAL SIGNS:  Blood pressure 117/73, pulse 85, temperature 98.4 F (  36.9 C), temperature source Oral, resp. rate 16, height 5\' 2"  (1.575 m), weight 59.285 kg (130 lb 11.2 oz), SpO2 95 %.  I/O:   Intake/Output Summary (Last 24 hours) at 09/24/15 1011 Last data filed at 09/24/15 0929  Gross per 24 hour  Intake    175 ml  Output   3425 ml  Net  -3250 ml    PHYSICAL EXAMINATION:  GENERAL:  55 y.o.-year-old patient lying in the bed with no acute distress.  EYES: Pupils equal, round, reactive to light and accommodation. No scleral icterus. Extraocular muscles intact.  HEENT: Head atraumatic, normocephalic. Oropharynx and nasopharynx clear.  NECK:  Supple, no jugular venous distention. No thyroid enlargement, no tenderness.  LUNGS: Normal breath sounds bilaterally, no wheezing, rales,rhonchi or crepitation. No use of accessory muscles of respiration.  CARDIOVASCULAR: S1, S2 normal. No murmurs, rubs, or gallops.  ABDOMEN: Soft,  non-tender, non-distended. Bowel sounds present. No organomegaly or mass.  EXTREMITIES: No pedal edema, cyanosis, or clubbing.  NEUROLOGIC: Cranial nerves II through XII are intact. Muscle strength 5/5 in all extremities. Sensation intact. Gait not checked.  PSYCHIATRIC: The patient is alert and oriented x 3.  SKIN: No obvious rash, lesion, or ulcer.   DATA REVIEW:   CBC  Recent Labs Lab 09/24/15 0442  WBC 3.2*  HGB 7.6*  HCT 22.7*  PLT 265    Chemistries   Recent Labs Lab 09/23/15 0609 09/24/15 0442  NA 137 139  K 3.3* 3.6  CL 107 108  CO2 26 29  GLUCOSE 115* 87  BUN 7 5*  CREATININE <0.30* <0.30*  CALCIUM 7.6* 7.4*  MG 1.6* 2.1  AST 23  --   ALT 15  --   ALKPHOS 40  --   BILITOT 0.2*  --     Cardiac Enzymes No results for input(s): TROPONINI in the last 168 hours.  Microbiology Results  Results for orders placed or performed during the hospital encounter of 09/04/15  Urine culture     Status: None   Collection Time: 09/04/15  9:01 PM  Result Value Ref Range Status   Specimen Description URINE, RANDOM  Final   Special Requests NONE  Final   Culture MULTIPLE SPECIES PRESENT, SUGGEST RECOLLECTION  Final   Report Status 09/06/2015 FINAL  Final  Blood Culture (routine x 2)     Status: None   Collection Time: 09/04/15  9:41 PM  Result Value Ref Range Status   Specimen Description BLOOD RIGHT ARM  Final   Special Requests BOTTLES DRAWN AEROBIC AND ANAEROBIC  Final   Culture NO GROWTH 5 DAYS  Final   Report Status 09/09/2015 FINAL  Final  Blood Culture (routine x 2)     Status: None   Collection Time: 09/04/15  9:41 PM  Result Value Ref Range Status   Specimen Description BLOOD LEFT ARM  Final   Special Requests BOTTLES DRAWN AEROBIC AND ANAEROBIC  Final   Culture NO GROWTH 5 DAYS  Final   Report Status 09/09/2015 FINAL  Final  Urine culture     Status: None   Collection Time: 09/06/15  4:58 PM  Result Value Ref Range Status   Specimen  Description URINE, CATHETERIZED  Final   Special Requests ertapenem and vanco Normal  Final   Culture   Final    >=100,000 COLONIES/mL VANCOMYCIN RESISTANT ENTEROCOCCUS ENTEROCOCCUS FAECIUM VRE HAVE INTRINSIC RESISTANCE TO MOST COMMONLY USED ANTIBIOTICS AND THE ABILITY TO ACQUIRE RESISTANCE TO MOST AVAILABLE ANTIBIOTICS. WITH MIXED  BACTERIAL ORGANISMS    Report Status 09/10/2015 FINAL  Final   Organism ID, Bacteria VANCOMYCIN RESISTANT ENTEROCOCCUS  Final      Susceptibility   Vancomycin resistant enterococcus - MIC*    AMPICILLIN >=32 RESISTANT Resistant     LEVOFLOXACIN >=8 RESISTANT Resistant     NITROFURANTOIN 128 RESISTANT Resistant     VANCOMYCIN >=32 RESISTANT Resistant     LINEZOLID 2 SENSITIVE Sensitive     * >=100,000 COLONIES/mL VANCOMYCIN RESISTANT ENTEROCOCCUS  C difficile quick scan w PCR reflex     Status: Abnormal   Collection Time: 09/09/15  4:14 PM  Result Value Ref Range Status   C Diff antigen POSITIVE (A) NEGATIVE Final   C Diff toxin NEGATIVE NEGATIVE Final   C Diff interpretation   Final    Positive for toxigenic C. difficile, active toxin production not detected. Patient has toxigenic C. difficile organisms present in the bowel, but toxin was not detected. The patient may be a carrier or the level of toxin in the sample was below the limit  of detection. This information should be used in conjunction with the patient's clinical history when deciding on possible therapy.     Comment: CRITICAL RESULT CALLED TO, READ BACK BY AND VERIFIED WITH: Burnett Corrente ON 09/09/15 AT 2019 BY TLB   Clostridium Difficile by PCR     Status: Abnormal   Collection Time: 09/09/15  4:14 PM  Result Value Ref Range Status   Toxigenic C Difficile by pcr POSITIVE (A) NEGATIVE Final    Comment: CRITICAL RESULT CALLED TO, READ BACK BY AND VERIFIED WITH: Burnett Corrente ON 09/09/15 AT 2019 BY TLB   MRSA PCR Screening     Status: None   Collection Time: 09/11/15  6:52 AM  Result Value  Ref Range Status   MRSA by PCR NEGATIVE NEGATIVE Final    Comment:        The GeneXpert MRSA Assay (FDA approved for NASAL specimens only), is one component of a comprehensive MRSA colonization surveillance program. It is not intended to diagnose MRSA infection nor to guide or monitor treatment for MRSA infections.   Culture, bal-quantitative     Status: None   Collection Time: 09/11/15  2:21 PM  Result Value Ref Range Status   Specimen Description BRONCHIAL ALVEOLAR LAVAGE  Final   Special Requests Normal BRONCHIAL BRUSHING  Final   Gram Stain   Final    FEW WBC SEEN RARE SQUAMOUS EPITHELIAL CELLS PRESENT NO ORGANISMS SEEN    Culture Consistent with normal respiratory flora.  Final   Report Status 09/13/2015 FINAL  Final  Fungus Culture with Smear     Status: None (Preliminary result)   Collection Time: 09/11/15  2:21 PM  Result Value Ref Range Status   Specimen Description BRONCHIAL BRUSHING  Final   Special Requests Normal  Final   Culture CANDIDA GLABRATA  Final   Report Status PENDING  Incomplete  Virus culture     Status: None   Collection Time: 09/11/15  2:21 PM  Result Value Ref Range Status   Viral Culture No virus isolated.  Final    Comment: (NOTE) Performed At: [facility] [address] [place], [place] [id number] Mila Homer MD WU:[id number]    Source of Sample BRONCHIAL BRUSHING  Final    RADIOLOGY:  Dg Chest Port 1 View  09/23/2015  CLINICAL DATA:  Shortness of breath and fever. Aspiration. Inpatient. EXAM: PORTABLE CHEST 1 VIEW COMPARISON:  09/16/2015 chest radiograph.  FINDINGS: Left internal jugular central venous catheter terminates in the lower third of the superior vena cava. Very low lung volumes. Stable cardiomediastinal silhouette with normal heart size. No pneumothorax. Small bilateral pleural effusions, right greater than left, increased bilaterally. No overt pulmonary edema. Patchy and curvilinear bibasilar lung opacities,  increased on the right. Oral contrast is seen in the gastric fundus. IMPRESSION: 1. Very low lung volumes. Patchy and curvilinear bibasilar lung opacities, increased on the right, likely atelectasis, cannot exclude a component of aspiration or pneumonia. 2. Small bilateral pleural effusions, right greater the left, increased bilaterally. Electronically Signed   By: Delbert Phenix M.D.   On: 09/23/2015 11:14     Management plans discussed with the patient, family and they are in agreement.  CODE STATUS:     Code Status Orders        Start     Ordered   09/23/15 1627  Full code   Continuous     09/23/15 1627    Code Status History    Date Active Date Inactive Code Status Order ID Comments User Context   09/23/2015  3:28 PM 09/23/2015  3:29 PM DNR [id number]  Wyatt Haste, MD Inpatient   09/04/2015 11:49 PM 09/23/2015  3:28 PM Full Code [id number]  Milagros Loll, MD ED   06/26/2015  7:02 PM 06/29/2015  7:16 PM Partial Code [id number]  Milagros Loll, MD ED   01/04/2015  7:34 PM 01/06/2015  7:24 PM Full Code [id number]  Ramonita Lab, MD Inpatient   12/31/2014 11:17 PM 01/03/2015  9:35 PM Full Code [id number]  Shaune Pollack, MD Inpatient   09/15/2014 12:58 PM 09/16/2014  3:59 PM Full Code [id number]  Suzi Roots, MD ED      TOTAL TIME TAKING CARE OF THIS PATIENT: 33 minutes.    Hower,  Mardi Mainland.D on 09/24/2015 at 10:11 AM  Between 7am to 6pm - Pager - [phone]  After 6pm go to [email or web address] - password [email or web address] [facility]  [place] [facility]  Office  [phone]  CC: Primary care physician; Ruthe Mannan, MD

## 2015-09-24 NOTE — Progress Notes (Signed)
Speech Language Pathology Treatment: Dysphagia (education and handouts/info/supplies)  Patient Details Name: Jennifer [name] MRN: [id number] DOB: 1960/08/29 Today's Date: 09/24/2015 Time: 6213-0865 SLP Time Calculation (min) (ACUTE ONLY): 45 min  Assessment / Plan / Recommendation Clinical Impression  Pt appears to be tolerating her modified dysphagia diet of Dys. 1 w/ Nectar liquids as rec'd per results of recent MBSS. Unable to meet w/ husband so far today, so education and information was reviewed w/ pt. Pt acknowledged she "liked" the Nectar consistency liquids and agreed she did not cough when drinking these liquids. Pt was able to recall 2 aspiration precautions. Information given on diet preparation and consistency (dys. 1 - puree); education on Reflux and Esophageal dysmotility given as well w/ strategies to lessen the "full" feelings and reduce risk for regurgitation and aspiration of such. Pt nodded and agreed verbally but understood this was much information to review and comprehend at one time. Supplies and information/handout material gathered and left in room for husband; pt is scheduled to be followed by [facility] ST services for further education on Aspiration precautions, diet prep/options, and thickening of liquids/ordering supplies (no dysphagia tx indicated sec. to pt's baseline dx of ALS) once discharged per MD order.    HPI HPI: Pt is a 55 y.o. female with a known history of ALS, chronic Foley catheter is brought to the emergency room by husband due to hallucinations, weakness and confusion. Patient does have psychiatric issues and generally gets agitated due to those. admitted for UTI with sepsis and acute encephalopathy; pt has had UTIs before per report. During this admission, pt was orally intubated for ~1 week d/t declined respiratory status; MD was concerned for aspiration. MBSS on 09/06/15 revealed concern for high risk of prandial aspiration sec. to moderate-severe oropharyngeal phase  dysphagia as well as risk for aspiration of Reflux d/t moderate-severe Esophageal phase deficits per the MBSS. Due to pt's declined medical status and weakness overall from this extended hospitalization including oral intubation on vent for ~1 week (and w/ consideration of impact of ALS), pt is at increased risk for aspriation of po's. Primary MD has discussed this w/ husband who requests another MBSS prior to consideration of a PEG placement d/t pt "feeling stronger now". MBSS completed w/ min. improvement in her swallowing; MD and husband discussed results and POC and pt is now on a Dys. 1 w/ Nectar liquids w/ altenrative means of feeding put on hold for now. Pt is d/t discharge today home w/ Husband. NSG reported adequate toleration of current dysphagia diet w/ aspiration precautions.       SLP Plan  Continue with current plan of care (pt is d/t d/c today home w/ husband)     Recommendations  Diet recommendations: Dysphagia 1 (puree);Nectar-thick liquid Liquids provided via: Teaspoon;Cup Medication Administration: Crushed with puree Supervision: Full supervision/cueing for compensatory strategies;Trained caregiver to feed patient Compensations: Minimize environmental distractions;Slow rate;Small sips/bites;Follow solids with liquid (rest breaks during meals) Postural Changes and/or Swallow Maneuvers: Seated upright 90 degrees;Upright 30-60 min after meal (reflux precautions)             General recommendations:  (dietician f/u for any supplements) Oral Care Recommendations: Oral care BID;Staff/trained caregiver to provide oral care Follow up Recommendations: Home health SLP (for continued education on diet/prep/thickened liquids) Plan: Continue with current plan of care (pt is d/t d/c today home w/ husband)     GO               Jennifer Som, MS, CCC-SLP  Jennifer [name],Jennifer [name] 09/24/2015, 3:18 PM

## 2015-09-24 NOTE — Clinical Social Work Note (Signed)
Patient's husband requesting EMS ride for patient to return home today. CSW has completed the form and patient's nurse will verify address and ensure husband is home for EMS to transport. York Spaniel MSW,LCSW [phone]

## 2015-09-24 NOTE — Progress Notes (Signed)
Left IJ catheter was removed per Dr. Clint Guy order. Pt is getting ready for discharge and awaiting EMS and husband arrival.  Jennifer [name]

## 2015-09-24 NOTE — Care Management (Signed)
Plan to discharge today with home health.   MD has entered resumption of care orders for RN and aide and added SLP.  Barbara Cower with [facility] notified of discharge plan.  Patient has been weaned to [facility].  Patient to be transported by EMS RNCM signing off

## 2015-09-24 NOTE — Progress Notes (Signed)
PHARMACY - MEDICATION RELATED PROGRESS NOTE  Pharmacy Consult for electrolyte management    Patient Measurements: Height:  (157.5 cm) Weight: 130 lb 11.2 oz (59.285 kg) IBW/kg (Calculated) : 50.1  Vital Signs: Temp: 98.4 F (36.9 C) (02/28 0544) Temp Source: Oral (02/28 0544) BP: 117/73 mmHg (02/28 0544) Pulse Rate: 85 (02/28 0544) Intake/Output from previous day: 02/27 0701 - 02/28 0700 In: 175 [P.O.:175] Out: 2500 [Urine:2500] Intake/Output from this shift:   Vent settings for last 24 hours:    Labs:  Recent Labs  09/22/15 0620 09/23/15 0609 09/24/15 0442  WBC  --  7.8 3.2*  HGB  --  7.9* 7.6*  HCT  --  23.2* 22.7*  PLT  --  339 265  CREATININE <0.30* <0.30* <0.30*  MG  --  1.6* 2.1  PHOS  --  2.9  --   ALBUMIN  --  1.9*  --   PROT  --  4.1*  --   AST  --  23  --   ALT  --  15  --   ALKPHOS  --  40  --   BILITOT  --  0.2*  --    CrCl cannot be calculated (Patient has no serum creatinine result on file.).  Lab Results  Component Value Date   K 3.6 09/24/2015    Recent Labs  09/23/15 0732 09/23/15 1229 09/23/15 1646  GLUCAP 99 83 106*    Assessment: Pharmacy consulted for electrolyte management for 55 yo female with ALS and c. Diff on Tube feeds, aspiration PNA  Electrolytes: WNL    Plan:  No supplementation warranted today.  Thank you for allowing pharmacy to be part of this patient's care.   Demetrius Charity, PharmD  Clinical Pharmacist 09/24/2015

## 2015-09-24 NOTE — Progress Notes (Addendum)
Tried calling patient's husband-Don unable to get ahold of him this morning however, plan remains patient able for discharge we'll attempt to recontact later today  home

## 2015-09-24 NOTE — Progress Notes (Signed)
Patient discharge teaching given, including activity, diet, follow-up appoints, and medications. Patient and husband verbalized understanding of all discharge instructions. IJ access was d/c'd. Vitals are stable. Skin is intact except as charted in most recent assessments. Pt to be escorted out by EMS, to be driven home.  Jennifer [name]

## 2015-09-25 ENCOUNTER — Telehealth: Payer: Self-pay | Admitting: Family Medicine

## 2015-09-25 NOTE — Telephone Encounter (Signed)
[facility]enn[name] from [facility] called to get orders for skilled nursing to change catheter once a month  cb number is [phone]  She also wants an order to use foam for her stage 2 crushed area on her bottom  She also has swelling in legs and arms wants dr to be aware

## 2015-09-26 NOTE — Telephone Encounter (Signed)
Lm on Jennifer [name]'s vm and provided verbal order. Advised to contact office back with any additional questions/orders

## 2015-09-26 NOTE — Telephone Encounter (Signed)
Ok to give verbal orders as requested. 

## 2015-09-27 ENCOUNTER — Telehealth: Payer: Self-pay

## 2015-09-27 NOTE — Telephone Encounter (Signed)
Spoke to pts husband and advised. Pt has upcoming 3/7 appt

## 2015-09-27 NOTE — Telephone Encounter (Addendum)
Amy speech therapist with [facility] is at pts home, O2 sats 79-89 but mostly running in mid 80's at rest; pt is in no distress and no complaint of SOB. Pt has [facility] F/u with Dr Dayton MartesAron on 10/01/15. Amy wanted physician to be aware and are there any instructions. Spoke with Dr Reece AgarG and he advised pt should go to ED for eval. Amy voiced understanding and will let pts husband know. Amy will cb if needed.

## 2015-09-27 NOTE — Telephone Encounter (Signed)
Anticipate reliable readings by home health. Recent recorded O2 sats 95-99% H/o ALS rec evaluation. Will cc PCP as fyi.

## 2015-09-27 NOTE — Telephone Encounter (Signed)
Mr Jennifer [name] left v/m; pt was recently d/c from hospital and Dr Dayton MartesAron is aware of pt condition; request prescription for hospital bed faxed to [facility] Fax # [phone]. Don request cb when done.

## 2015-09-27 NOTE — Telephone Encounter (Signed)
Spoke to ChupaderoRBaity, who confirmed with Dr Reece AgarG, pt must have an OV for hospital bed orders. Pt must have OV note to accompany order

## 2015-09-27 NOTE — Telephone Encounter (Signed)
Noted.  Please make sure pt's husband is aware

## 2015-09-27 NOTE — Telephone Encounter (Signed)
I am out of the office until Monday.  Can you please ask one of my partners to do this for me so she does not have to wait?  Thanks so much.

## 2015-09-28 ENCOUNTER — Other Ambulatory Visit: Payer: Self-pay | Admitting: Family Medicine

## 2015-09-28 LAB — CULTURE, BLOOD (ROUTINE X 2)
Culture: NO GROWTH
Culture: NO GROWTH

## 2015-09-30 NOTE — Telephone Encounter (Signed)
Last f/u 12/2014 

## 2015-09-30 NOTE — Telephone Encounter (Signed)
Rx called in to requested pharmacy 

## 2015-10-01 ENCOUNTER — Ambulatory Visit: Payer: Self-pay | Admitting: Family Medicine

## 2015-10-02 LAB — FUNGUS CULTURE W SMEAR: SPECIAL REQUESTS: NORMAL

## 2015-10-03 ENCOUNTER — Encounter: Payer: Self-pay | Admitting: Family Medicine

## 2015-10-03 ENCOUNTER — Ambulatory Visit (INDEPENDENT_AMBULATORY_CARE_PROVIDER_SITE_OTHER): Payer: BLUE CROSS/BLUE SHIELD | Admitting: Family Medicine

## 2015-10-03 VITALS — BP 131/85 | HR 86 | Temp 97.5°F

## 2015-10-03 DIAGNOSIS — A0472 Enterocolitis due to Clostridium difficile, not specified as recurrent: Secondary | ICD-10-CM | POA: Insufficient documentation

## 2015-10-03 DIAGNOSIS — A047 Enterocolitis due to Clostridium difficile: Secondary | ICD-10-CM

## 2015-10-03 DIAGNOSIS — G1221 Amyotrophic lateral sclerosis: Secondary | ICD-10-CM | POA: Diagnosis not present

## 2015-10-03 DIAGNOSIS — A419 Sepsis, unspecified organism: Secondary | ICD-10-CM

## 2015-10-03 DIAGNOSIS — J9601 Acute respiratory failure with hypoxia: Secondary | ICD-10-CM

## 2015-10-03 DIAGNOSIS — L8990 Pressure ulcer of unspecified site, unspecified stage: Secondary | ICD-10-CM

## 2015-10-03 DIAGNOSIS — N390 Urinary tract infection, site not specified: Secondary | ICD-10-CM

## 2015-10-03 MED ORDER — FENTANYL 12 MCG/HR TD PT72: 12.5000 ug | MEDICATED_PATCH | TRANSDERMAL | Status: DC

## 2015-10-03 NOTE — Progress Notes (Signed)
Pre visit review using our clinic review tool, if applicable. No additional management support is needed unless otherwise documented below in the visit note. 

## 2015-10-03 NOTE — Assessment & Plan Note (Signed)
Repeat UA and urine cx and s today.

## 2015-10-03 NOTE — Assessment & Plan Note (Signed)
FAx order for hospital bed to [facility]. Tylenol and vicodin not helping with progressive pain. Rx for fentanyl given to her husband but advised he talk with her ALS doctor prior to filling this due to risk of respiratory depression. He agrees.  >45 minutes spent in face to face time with patient, >50% spent in counselling or coordination of care discussing UTI, respiratory failure, C diff, ALS, sacral decub and pain.

## 2015-10-03 NOTE — Patient Instructions (Signed)
Great to see you. Please let me know what Dr .Zannie KehrBedlack says about the Fentanyl.

## 2015-10-03 NOTE — Assessment & Plan Note (Signed)
Finish course of oral vanc

## 2015-10-03 NOTE — Progress Notes (Signed)
Subjective:    Patient ID: Jennifer [name], female    DOB: [birth year], 55 y.o.   MRN: [id number]  HPI    55 y.o. female with a known history of ALS, chronic Foley catheter, hx of recurrent resistant UTI infections here with her husband for hospital follow up.   Admitted to [facility] 09/04/15 for AMS. Notes reviewed.  She was found to be septic believed to be secondary to UTI.  H/o ESBL UTI. Course was further complicated with C Diff- started on oral vancomycin on 09/11/15.  She then developed respiratory distress which required intubation/mechanical ventilation- extubated on 09/17/15.   She has a urinary catheter in place.   urine culture  grew Vanc resistant enterococcus.  ID was again consulted.  Treated with dapto and linezolid, zosyn and oral vanc- stop date was 09/30/15.  Since discharge, still having diarrhea and abdominal pain.  Still taking oral vanc.  Now has two sacral decubitus ulcers that are painful but just dressed by home health RN>   Current Outpatient Prescriptions on File Prior to Visit  Medication Sig Dispense Refill  . ARIPiprazole (ABILIFY) 5 MG tablet TAKE 1 TABLET (5 MG TOTAL) BY MOUTH DAILY. 30 tablet 0  . atropine 1 % ophthalmic solution Place 2 drops under the tongue as needed (for excessive secretions).    [name] buPROPion (WELLBUTRIN SR) 150 MG 12 hr tablet Take 300 mg by mouth 2 (two) times daily.    . cholecalciferol (VITAMIN D) 1000 UNITS tablet Take 1,000 Units by mouth daily.    [name] Dextromethorphan-Quinidine (NUEDEXTA) 20-10 MG CAPS Take 1 capsule by mouth 2 (two) times daily.     . diazepam (VALIUM) 10 MG tablet TAKE 1 TABLET BY MOUTH EVERY 6 HOURS AS NEEDED 90 tablet 0  . fluticasone (FLONASE) 50 MCG/ACT nasal spray Place 2 sprays into both nostrils daily as needed for rhinitis.    . fosfomycin (MONUROL) 3 G PACK Take 3 g by mouth every other day. 15 g 0  . ibuprofen (ADVIL,MOTRIN) 200 MG tablet Take 800 mg by mouth 3 (three) times daily as needed for mild  pain.     [name] lactulose (CHRONULAC) 10 GM/15ML solution Take 15 mLs (10 g total) by mouth 3 (three) times daily as needed for mild constipation. 240 mL 0  . levETIRAcetam (KEPPRA) 500 MG tablet Take 500 mg by mouth 2 (two) times daily.     [name] loratadine (CLARITIN) 10 MG tablet Take 10 mg by mouth daily as needed for allergies.     [name] omeprazole (PRILOSEC) 40 MG capsule Take 40 mg by mouth daily.     . potassium chloride 20 MEQ/15ML (10%) SOLN Take 20 mEq by mouth daily.    . ranitidine (ZANTAC) 150 MG tablet Take 150 mg by mouth daily as needed for heartburn.    . tizanidine (ZANAFLEX) 2 MG capsule Take 2 mg by mouth at bedtime.     . vancomycin (VANCOCIN) 50 mg/mL oral solution Take 2.5 mLs (125 mg total) by mouth every 6 (six) hours. 70 mL 0   No current facility-administered medications on file prior to visit.    Allergies  Allergen Reactions  . Bee Venom Anaphylaxis  . Hydrocodone Other (See Comments)    Reaction:  Muscle stiffness   . Lipitor [Atorvastatin] Other (See Comments)    Reaction:  Muscle cramps   . Shellfish Allergy Anaphylaxis  . Oxycodone Other (See Comments)    Reaction:  Muscle stiffness   . Prednisone Other (See  Comments)    Reaction:  Psychosis   . Vicodin [Hydrocodone-Acetaminophen] Other (See Comments)    Reaction:  Muscle stiffness   . Oxycontin [Oxycodone Hcl] Other (See Comments)    Reaction:  Muscle stiffness   . Peanut-Containing Drug Products Diarrhea    Past Medical History  Diagnosis Date  . ALS (amyotrophic lateral sclerosis) (HCC)   . Depression   . GERD (gastroesophageal reflux disease)   . Urinary retention   . Dysphagia, oropharyngeal 11/28/2013  . CN (constipation) 11/28/2013    Past Surgical History  Procedure Laterality Date  . Cesarean section    . Nasal sinus surgery      Family History  Problem Relation Age of Onset  . COPD Mother   . Thyroid disease Mother   . Diverticulosis Mother   . Heart attack Mother   . Gout Father   .  Depression Sister   . Thyroid disease Sister   . Cancer Brother     kidney  . Heart attack Maternal Grandmother   . Heart attack Maternal Grandfather   . Heart attack Paternal Grandfather   . Hyperlipidemia Sister   . Evelene CroonWolff Parkinson White syndrome Sister 25  . Depression Brother   . Hypertension Brother     Social History   Social History  . Marital Status: Married    Spouse Name: N/A  . Number of Children: 1  . Years of Education: N/A   Occupational History  . home    Social History Main Topics  . Smoking status: Former Games developermoker  . Smokeless tobacco: Never Used  . Alcohol Use: No  . Drug Use: No  . Sexual Activity: No   Other Topics Concern  . Not on file   Social History Narrative   Daughter has severe reflux since infancy   The PMH, PSH, Social History, Family History, Medications, and allergies have been reviewed in [facility], and have been updated if relevant.   Review of Systems  Constitutional: Negative for fever.  Respiratory: Negative.   Cardiovascular: Negative.   Gastrointestinal: Positive for abdominal pain and diarrhea. Negative for nausea, vomiting, constipation, blood in stool, anal bleeding and rectal pain.  Neurological: Negative.   Hematological: Negative.   Psychiatric/Behavioral: Positive for agitation.  All other systems reviewed and are negative.      Objective:   Physical Exam  Constitutional: Vital signs are normal. She appears well-developed and well-nourished. She is cooperative.  Non-toxic appearance. She does not appear ill. No distress.  Pt unable to speak well given ALS, in wheelchair, poor muscle tone.  HENT:  Head: Normocephalic.  Right Ear: Hearing, tympanic membrane, external ear and ear canal normal. Tympanic membrane is not erythematous, not retracted and not bulging.  Left Ear: Hearing, tympanic membrane, external ear and ear canal normal. Tympanic membrane is not erythematous, not retracted and not bulging.  Nose: No mucosal  edema or rhinorrhea. Right sinus exhibits no maxillary sinus tenderness and no frontal sinus tenderness. Left sinus exhibits no maxillary sinus tenderness and no frontal sinus tenderness.  Mouth/Throat: Uvula is midline, oropharynx is clear and moist and mucous membranes are normal.  Eyes: Conjunctivae, EOM and lids are normal. Pupils are equal, round, and reactive to light. Lids are everted and swept, no foreign bodies found.  Neck: Trachea normal and normal range of motion. Neck supple. Carotid bruit is not present. No thyroid mass and no thyromegaly present.  Cardiovascular: Normal rate, regular rhythm, S1 normal, S2 normal, normal heart sounds, intact distal pulses  and normal pulses.  Exam reveals no gallop and no friction rub.   No murmur heard. Pulmonary/Chest: Effort normal and breath sounds normal. No tachypnea. No respiratory distress. She has no decreased breath sounds. She has no wheezes. She has no rhonchi. She has no rales.  Abdominal: Soft. Normal appearance and bowel sounds are normal. There is tenderness in the right lower quadrant. There is CVA tenderness.  right  Musculoskeletal: She exhibits edema.  Neurological: She is alert.  Skin: Skin is warm, dry and intact. No rash noted.  Psychiatric: Her speech is normal and behavior is normal. Judgment and thought content normal. Her mood appears not anxious. Cognition and memory are normal. She does not exhibit a depressed mood.    BP 131/85 mmHg  Pulse 86  Temp(Src) 97.5 F (36.4 C) (Oral)  Wt   SpO2 99%       Assessment & Plan:

## 2015-10-04 NOTE — Addendum Note (Signed)
Addended by: Alvina ChouWALSH, TERRI J on: 10/04/2015 08:40 AM   Modules accepted: Orders

## 2015-10-10 ENCOUNTER — Telehealth: Payer: Self-pay | Admitting: Family Medicine

## 2015-10-10 NOTE — Telephone Encounter (Signed)
Jennifer [name] @ [facility] needs order for pt to have hospital bed @ home  Semi automatic and add dob of patient She faxed order sheet over to get signature

## 2015-10-10 NOTE — Telephone Encounter (Signed)
Will await form for completion 

## 2015-10-10 NOTE — Telephone Encounter (Signed)
This will have to be done by Dr. Dayton MartesAron upon her return. [facility] requires MD signature

## 2015-10-18 ENCOUNTER — Encounter: Payer: Self-pay | Admitting: Family Medicine

## 2015-10-25 LAB — ACID FAST SMEAR+CULTURE W/RFLX (ARMC ONLY)
ACID FAST SMEAR - AFSCU2: NEGATIVE
Acid Fast Culture: NEGATIVE

## 2015-10-29 ENCOUNTER — Other Ambulatory Visit: Payer: Self-pay | Admitting: Family Medicine

## 2015-10-30 NOTE — Telephone Encounter (Signed)
Rx called in to requested pharmacy 

## 2015-10-31 ENCOUNTER — Telehealth: Payer: Self-pay

## 2015-10-31 NOTE — Telephone Encounter (Signed)
Jennifer LaymanLindsey nurse with [facility] left v/m requesting verbal orders  to extend home health orders for another 60 days; pt has ALS and needs assistance with catheter care.

## 2015-11-01 NOTE — Telephone Encounter (Signed)
[facility] on Lindsey's vm [facility]

## 2015-11-01 NOTE — Telephone Encounter (Signed)
Ok to give verbal orders as requested. 

## 2015-11-04 DIAGNOSIS — G1221 Amyotrophic lateral sclerosis: Secondary | ICD-10-CM | POA: Diagnosis not present

## 2015-11-04 DIAGNOSIS — F3289 Other specified depressive episodes: Secondary | ICD-10-CM | POA: Diagnosis not present

## 2015-11-04 DIAGNOSIS — L89152 Pressure ulcer of sacral region, stage 2: Secondary | ICD-10-CM | POA: Diagnosis not present

## 2015-11-04 DIAGNOSIS — Z87440 Personal history of urinary (tract) infections: Secondary | ICD-10-CM | POA: Diagnosis not present

## 2015-11-05 ENCOUNTER — Telehealth: Payer: Self-pay | Admitting: Family Medicine

## 2015-11-05 ENCOUNTER — Encounter: Payer: Self-pay | Admitting: Family Medicine

## 2015-11-05 MED ORDER — CIPROFLOXACIN HCL 500 MG PO TABS: 500.0000 mg | ORAL_TABLET | Freq: Two times a day (BID) | ORAL | Status: DC

## 2015-11-05 NOTE — Telephone Encounter (Signed)
[place]oke to [place] and advised per Dr Dayton MartesAron

## 2015-11-05 NOTE — Telephone Encounter (Signed)
Urine cx grew >100,000 Citrobacter sensitive to Cipro, Bactrim, tetracycline, imipenem. eRx sent for cipro to her pharmacy.

## 2015-11-05 NOTE — Telephone Encounter (Signed)
Please call Mardella LaymanLindsey back with instructions from lab report done on 10/29/15.

## 2015-11-14 ENCOUNTER — Emergency Department
Admission: EM | Admit: 2015-11-14 | Discharge: 2015-11-14 | Disposition: A | Discharge status: Home / Self Care | Payer: BLUE CROSS/BLUE SHIELD | Attending: Emergency Medicine | Admitting: Emergency Medicine

## 2015-11-14 ENCOUNTER — Encounter: Payer: Self-pay | Admitting: Emergency Medicine

## 2015-11-14 DIAGNOSIS — J9600 Acute respiratory failure, unspecified whether with hypoxia or hypercapnia: Secondary | ICD-10-CM | POA: Diagnosis not present

## 2015-11-14 DIAGNOSIS — F329 Major depressive disorder, single episode, unspecified: Secondary | ICD-10-CM | POA: Diagnosis not present

## 2015-11-14 DIAGNOSIS — Z91010 Allergy to peanuts: Secondary | ICD-10-CM | POA: Insufficient documentation

## 2015-11-14 DIAGNOSIS — N3090 Cystitis, unspecified without hematuria: Secondary | ICD-10-CM | POA: Diagnosis not present

## 2015-11-14 DIAGNOSIS — Z87891 Personal history of nicotine dependence: Secondary | ICD-10-CM | POA: Insufficient documentation

## 2015-11-14 DIAGNOSIS — M79605 Pain in left leg: Secondary | ICD-10-CM | POA: Insufficient documentation

## 2015-11-14 DIAGNOSIS — F4320 Adjustment disorder, unspecified: Secondary | ICD-10-CM | POA: Diagnosis not present

## 2015-11-14 DIAGNOSIS — M79604 Pain in right leg: Secondary | ICD-10-CM | POA: Diagnosis present

## 2015-11-14 DIAGNOSIS — E785 Hyperlipidemia, unspecified: Secondary | ICD-10-CM | POA: Insufficient documentation

## 2015-11-14 DIAGNOSIS — Z79899 Other long term (current) drug therapy: Secondary | ICD-10-CM | POA: Insufficient documentation

## 2015-11-14 LAB — URINALYSIS COMPLETE WITH MICROSCOPIC (ARMC ONLY)
BILIRUBIN URINE: NEGATIVE
Bacteria, UA: NONE SEEN
GLUCOSE, UA: NEGATIVE mg/dL
HGB URINE DIPSTICK: NEGATIVE
KETONES UR: NEGATIVE mg/dL
NITRITE: NEGATIVE
PH: 6 (ref 5.0–8.0)
Protein, ur: NEGATIVE mg/dL
Specific Gravity, Urine: 1.017 (ref 1.005–1.030)

## 2015-11-14 LAB — CBC WITH DIFFERENTIAL/PLATELET
BASOS PCT: 1 %
Basophils Absolute: 0 10*3/uL (ref 0–0.1)
EOS ABS: 0.1 10*3/uL (ref 0–0.7)
Eosinophils Relative: 1 %
HEMATOCRIT: 39.4 % (ref 35.0–47.0)
HEMOGLOBIN: 13.4 g/dL (ref 12.0–16.0)
LYMPHS ABS: 2.1 10*3/uL (ref 1.0–3.6)
Lymphocytes Relative: 29 %
MCH: 31.4 pg (ref 26.0–34.0)
MCHC: 33.9 g/dL (ref 32.0–36.0)
MCV: 92.4 fL (ref 80.0–100.0)
MONOS PCT: 3 %
Monocytes Absolute: 0.2 10*3/uL (ref 0.2–0.9)
NEUTROS ABS: 4.7 10*3/uL (ref 1.4–6.5)
NEUTROS PCT: 66 %
Platelets: 229 10*3/uL (ref 150–440)
RBC: 4.27 MIL/uL (ref 3.80–5.20)
RDW: 14.2 % (ref 11.5–14.5)
WBC: 7.2 10*3/uL (ref 3.6–11.0)

## 2015-11-14 LAB — BASIC METABOLIC PANEL
Anion gap: 7 (ref 5–15)
BUN: 9 mg/dL (ref 6–20)
CHLORIDE: 104 mmol/L (ref 101–111)
CO2: 27 mmol/L (ref 22–32)
Calcium: 10 mg/dL (ref 8.9–10.3)
Creatinine, Ser: 0.58 mg/dL (ref 0.44–1.00)
GFR calc Af Amer: >60 mL/min (ref >60)
GFR calc non Af Amer: >60 mL/min (ref >60)
Glucose, Bld: 76 mg/dL (ref 65–99)
POTASSIUM: 4.1 mmol/L (ref 3.5–5.1)
Sodium: 138 mmol/L (ref 135–145)

## 2015-11-14 MED ORDER — FOSFOMYCIN TROMETHAMINE 3 G PO PACK
3.0000 g | PACK | Freq: Once | ORAL | Status: AC
  Administered 2015-11-14: 3 g via ORAL
  Filled 2015-11-14: qty 3

## 2015-11-14 MED ORDER — CEPHALEXIN 500 MG PO CAPS: 500.0000 mg | ORAL_CAPSULE | Freq: Three times a day (TID) | ORAL | Status: DC

## 2015-11-14 MED ORDER — ACETAMINOPHEN 500 MG PO TABS
ORAL_TABLET | ORAL | Status: AC
  Filled 2015-11-14: qty 1

## 2015-11-14 MED ORDER — CEPHALEXIN 500 MG PO CAPS
500.0000 mg | ORAL_CAPSULE | Freq: Once | ORAL | Status: AC
  Administered 2015-11-14: 500 mg via ORAL
  Filled 2015-11-14: qty 1

## 2015-11-14 NOTE — Discharge Instructions (Signed)
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
## 2015-11-14 NOTE — ED Notes (Signed)
[facility] Per [facility] pt woke up this morning screaming from bilateral leg pain. Pt has hx of ALS. Per EMS pt was transported at her PCP request for medication adjustment. Pt's daughter states they gave her 10mg  of Valium at 0700 with no relief. EMS also states that patient is being treated for a UTI with cipro.

## 2015-11-15 ENCOUNTER — Other Ambulatory Visit: Payer: Self-pay | Admitting: Family Medicine

## 2015-11-16 LAB — URINE CULTURE

## 2015-11-23 ENCOUNTER — Other Ambulatory Visit: Payer: Self-pay | Admitting: Family Medicine

## 2015-11-24 ENCOUNTER — Emergency Department
Admission: EM | Admit: 2015-11-24 | Discharge: 2015-11-25 | Disposition: A | Discharge status: Home / Self Care | Payer: BLUE CROSS/BLUE SHIELD | Attending: Emergency Medicine | Admitting: Emergency Medicine

## 2015-11-24 DIAGNOSIS — Z87891 Personal history of nicotine dependence: Secondary | ICD-10-CM | POA: Diagnosis not present

## 2015-11-24 DIAGNOSIS — A047 Enterocolitis due to Clostridium difficile: Secondary | ICD-10-CM | POA: Diagnosis not present

## 2015-11-24 DIAGNOSIS — E876 Hypokalemia: Secondary | ICD-10-CM

## 2015-11-24 DIAGNOSIS — R300 Dysuria: Secondary | ICD-10-CM

## 2015-11-24 DIAGNOSIS — F329 Major depressive disorder, single episode, unspecified: Secondary | ICD-10-CM | POA: Insufficient documentation

## 2015-11-24 DIAGNOSIS — A0472 Enterocolitis due to Clostridium difficile, not specified as recurrent: Secondary | ICD-10-CM

## 2015-11-24 DIAGNOSIS — R109 Unspecified abdominal pain: Secondary | ICD-10-CM | POA: Diagnosis present

## 2015-11-24 LAB — URINALYSIS COMPLETE WITH MICROSCOPIC (ARMC ONLY)
Bacteria, UA: NONE SEEN
Bilirubin Urine: NEGATIVE
GLUCOSE, UA: NEGATIVE mg/dL
Ketones, ur: NEGATIVE mg/dL
Nitrite: NEGATIVE
PH: 5 (ref 5.0–8.0)
PROTEIN: 30 mg/dL — AB
Specific Gravity, Urine: 1.023 (ref 1.005–1.030)

## 2015-11-24 LAB — CBC
HCT: 36.1 % (ref 35.0–47.0)
Hemoglobin: 12.3 g/dL (ref 12.0–16.0)
MCH: 31.5 pg (ref 26.0–34.0)
MCHC: 34.1 g/dL (ref 32.0–36.0)
MCV: 92.6 fL (ref 80.0–100.0)
PLATELETS: 258 10*3/uL (ref 150–440)
RBC: 3.9 MIL/uL (ref 3.80–5.20)
RDW: 13.5 % (ref 11.5–14.5)
WBC: 6.8 10*3/uL (ref 3.6–11.0)

## 2015-11-24 LAB — COMPREHENSIVE METABOLIC PANEL
ALT: 13 U/L — AB (ref 14–54)
AST: 21 U/L (ref 15–41)
Albumin: 4 g/dL (ref 3.5–5.0)
Alkaline Phosphatase: 52 U/L (ref 38–126)
Anion gap: 9 (ref 5–15)
BUN: 12 mg/dL (ref 6–20)
CO2: 27 mmol/L (ref 22–32)
CREATININE: 0.64 mg/dL (ref 0.44–1.00)
Calcium: 9.5 mg/dL (ref 8.9–10.3)
Chloride: 105 mmol/L (ref 101–111)
GFR calc Af Amer: >60 mL/min (ref >60)
Glucose, Bld: 91 mg/dL (ref 65–99)
Potassium: 3.3 mmol/L — ABNORMAL LOW (ref 3.5–5.1)
Sodium: 141 mmol/L (ref 135–145)
TOTAL PROTEIN: 7.1 g/dL (ref 6.5–8.1)
Total Bilirubin: 0.6 mg/dL (ref 0.3–1.2)

## 2015-11-24 LAB — C DIFFICILE QUICK SCREEN W PCR REFLEX
C DIFFICILE (CDIFF) TOXIN: NEGATIVE
C Diff antigen: POSITIVE — AB

## 2015-11-24 LAB — CLOSTRIDIUM DIFFICILE BY PCR: Toxigenic C. Difficile by PCR: POSITIVE — AB

## 2015-11-24 LAB — LIPASE, BLOOD: Lipase: 24 U/L (ref 11–51)

## 2015-11-24 MED ORDER — VANCOMYCIN 50 MG/ML ORAL SOLUTION: 125.0000 mg | Freq: Four times a day (QID) | ORAL | Status: DC

## 2015-11-24 MED ORDER — VANCOMYCIN 50 MG/ML ORAL SOLUTION
125.0000 mg | Freq: Four times a day (QID) | ORAL | Status: DC
  Administered 2015-11-24: 125 mg via ORAL
  Filled 2015-11-24 (×6): qty 2.5

## 2015-11-24 NOTE — ED Notes (Signed)
Positive c diff result called from lab. Dr. Shaune PollackLord notified.

## 2015-11-24 NOTE — ED Notes (Signed)
Pt and visitors were given something to drink with RN approval. Pt. Was repostitioned. Nothing else was needed of staff.

## 2015-11-24 NOTE — ED Notes (Signed)
Pt assisted with bedpan with production of soft brown stool. Sample sent to lab.

## 2015-11-24 NOTE — ED Notes (Signed)
Pt provided with meal tray and po fluids per request.
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
## 2015-11-24 NOTE — ED Notes (Signed)
Pt from home via EMS, reports for the past 4 months she hasnt been feeling well but it has gotten worse the past couple days. C/O lower abd pain, pt has chronic indwelling foley cath, was last changed 1 week ago

## 2015-11-24 NOTE — Discharge Instructions (Signed)
You were evaluated for decreased intake, and found to have mildly low potassium and given a supplement, as well as positive for C. difficile infection. You're being started back on oral antibiotics vancomycin for C. difficile.  We discussed, you must eat and drink in order to stay hydrated.  Return to emergency department for any worsening condition including confusion altered mental status, fever, abdominal pain, black or bloody stools, or concern for dehydration such as not making urine, or dry mouth.   Clostridium Difficile Infection Clostridium difficile (C. difficile or C. diff) is a bacterium normally found in the intestinal tract or colon. C. difficile infection causes diarrhea and sometimes a severe disease called pseudomembranous colitis (C. difficile colitis). C. difficile colitis can damage the lining of the colon or cause the colon to become very large (toxic megacolon). Older adults and people with certain medical conditions have a greater risk of getting C. difficile infections. CAUSES The balance of bacteria in your colon can change when you are sick, especially when taking antibiotic medicine. Taking antibiotics may allow the C. difficile to grow, multiply, and make a toxin that causes C. difficile infection.  SYMPTOMS  Diarrhea.  Fever.  Fatigue.  Loss of appetite.  Nausea.  Abdominal swelling, pain, or tenderness.  Dehydration. DIAGNOSIS Your health care provider may suspect C. difficile infection based on your symptoms and if you have taken antibiotics recently. Your health care provider may also order:  A lab test that can detect the toxin in your stool.  A sigmoidoscopy or colonoscopy to look at the appearance of your colon. These procedures involve passing an instrument through your rectum to look at the inside of your colon. Your health care provider will help determine if these tests are necessary. TREATMENT Treatment may include:  Taking antibiotics that  keep C. difficile from growing.  Stopping the antibiotics you were on before the C. difficile infection began. Only do this if instructed to do so by your health care provider.  IV fluids and correction of electrolyte imbalance.  Surgery to remove the infected part of the intestines. This is rare. HOME CARE INSTRUCTIONS  Drink enough fluids to keep your urine clear or pale yellow. Avoid milk, caffeine, and alcohol.  Ask your health care provider for specific rehydration instructions.  Eat small, frequent meals rather than large meals.  Take your antibiotics as directed. Finish them even if you start to feel better.  Do not use medicines to slow diarrhea. This could delay healing or cause problems.  Wash your hands thoroughly after using the bathroom and before preparing food. Make sure people who live with you wash their hands often, too.  Clean all surfaces with a product that contains chlorine bleach. SEEK MEDICAL CARE IF:  Your diarrhea lasts longer than expected or comes back after you finish your antibiotic medicine for the C. difficile infection.  You have trouble staying hydrated.  You have a fever. SEEK IMMEDIATE MEDICAL CARE IF:  You have increasing abdominal pain or tenderness.  You have blood in your stools, or your stools look dark black and tarry.  You cannot eat or drink without vomiting.   This information is not intended to replace advice given to you by your health care provider. Make sure you discuss any questions you have with your health care provider.   Document Released: 04/22/2005 Document Revised: 08/03/2014 Document Reviewed: 01/14/2015 Elsevier Interactive Patient Education [employer]2016 [employer] Inc[employer]

## 2015-11-24 NOTE — ED Notes (Signed)
Pt asked if it was okay to take her 10mg  valium that she normally takes. MD notified and said it was okay. Pt given 10mg  valium.

## 2015-11-24 NOTE — ED Notes (Signed)
Pt updated on c diff result turn around time.

## 2015-11-24 NOTE — ED Notes (Signed)
Pt and family updated on turn around time for c diff results.

## 2015-11-25 NOTE — Telephone Encounter (Signed)
Last f/u 09/2015 

## 2015-11-25 NOTE — Telephone Encounter (Signed)
Rx called in to requested pharmacy 

## 2015-11-26 LAB — URINE CULTURE: CULTURE: NO GROWTH

## 2015-12-03 ENCOUNTER — Ambulatory Visit: Payer: Self-pay | Admitting: Family Medicine

## 2015-12-07 ENCOUNTER — Emergency Department: Payer: BLUE CROSS/BLUE SHIELD

## 2015-12-07 ENCOUNTER — Encounter: Payer: Self-pay | Admitting: Emergency Medicine

## 2015-12-07 ENCOUNTER — Emergency Department
Admission: EM | Admit: 2015-12-07 | Discharge: 2015-12-07 | Disposition: A | Discharge status: Home / Self Care | Payer: BLUE CROSS/BLUE SHIELD | Attending: Emergency Medicine | Admitting: Emergency Medicine

## 2015-12-07 DIAGNOSIS — F329 Major depressive disorder, single episode, unspecified: Secondary | ICD-10-CM | POA: Insufficient documentation

## 2015-12-07 DIAGNOSIS — M5432 Sciatica, left side: Secondary | ICD-10-CM | POA: Diagnosis not present

## 2015-12-07 DIAGNOSIS — M549 Dorsalgia, unspecified: Secondary | ICD-10-CM | POA: Diagnosis present

## 2015-12-07 DIAGNOSIS — M5431 Sciatica, right side: Secondary | ICD-10-CM | POA: Insufficient documentation

## 2015-12-07 DIAGNOSIS — T7411XA Adult physical abuse, confirmed, initial encounter: Secondary | ICD-10-CM | POA: Insufficient documentation

## 2015-12-07 DIAGNOSIS — Z87891 Personal history of nicotine dependence: Secondary | ICD-10-CM | POA: Insufficient documentation

## 2015-12-07 DIAGNOSIS — T7491XA Unspecified adult maltreatment, confirmed, initial encounter: Secondary | ICD-10-CM

## 2015-12-07 DIAGNOSIS — Z79899 Other long term (current) drug therapy: Secondary | ICD-10-CM | POA: Diagnosis not present

## 2015-12-07 MED ORDER — KETOROLAC TROMETHAMINE 30 MG/ML IJ SOLN
30.0000 mg | Freq: Once | INTRAMUSCULAR | Status: AC
  Administered 2015-12-07: 30 mg via INTRAMUSCULAR
  Filled 2015-12-07: qty 1
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
## 2015-12-07 NOTE — ED Notes (Signed)
[facility] pain

## 2015-12-07 NOTE — ED Notes (Signed)
Social work at bedside.  
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
## 2015-12-07 NOTE — ED Notes (Signed)
Patient transported to X-ray 
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
## 2015-12-07 NOTE — Progress Notes (Signed)
[facility] [facility] [place]: [phone] left message, then called after hours Extended services [phone] and spoke to [facility] she will have one of her social workers contact me ASAP and call me on the cell phone.Awaiting a call back  Aubrey Voong HintonBandi LCSW [phone]

## 2015-12-09 ENCOUNTER — Ambulatory Visit: Payer: Self-pay | Admitting: Family Medicine

## 2015-12-12 ENCOUNTER — Encounter: Payer: Self-pay | Admitting: Family Medicine

## 2015-12-12 ENCOUNTER — Ambulatory Visit (INDEPENDENT_AMBULATORY_CARE_PROVIDER_SITE_OTHER): Payer: BLUE CROSS/BLUE SHIELD | Admitting: Family Medicine

## 2015-12-12 VITALS — BP 120/50 | HR 91 | Temp 97.6°F

## 2015-12-12 DIAGNOSIS — G1221 Amyotrophic lateral sclerosis: Secondary | ICD-10-CM

## 2015-12-12 DIAGNOSIS — S335XXD Sprain of ligaments of lumbar spine, subsequent encounter: Secondary | ICD-10-CM | POA: Diagnosis not present

## 2015-12-12 DIAGNOSIS — A0472 Enterocolitis due to Clostridium difficile, not specified as recurrent: Secondary | ICD-10-CM

## 2015-12-12 DIAGNOSIS — E876 Hypokalemia: Secondary | ICD-10-CM

## 2015-12-12 DIAGNOSIS — Z7189 Other specified counseling: Secondary | ICD-10-CM | POA: Insufficient documentation

## 2015-12-12 DIAGNOSIS — R339 Retention of urine, unspecified: Secondary | ICD-10-CM

## 2015-12-12 DIAGNOSIS — A047 Enterocolitis due to Clostridium difficile: Secondary | ICD-10-CM | POA: Diagnosis not present

## 2015-12-12 DIAGNOSIS — Z515 Encounter for palliative care: Secondary | ICD-10-CM

## 2015-12-12 NOTE — Addendum Note (Signed)
Addended by: Dianne DunARON, TALIA M on: 12/12/2015 01:30 PM   Modules accepted: Orders
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
## 2015-12-12 NOTE — Assessment & Plan Note (Signed)
S/p vancomycin and seems to be doing well.

## 2015-12-12 NOTE — Progress Notes (Signed)
Pre visit review using our clinic review tool, if applicable. No additional management support is needed unless otherwise documented below in the visit note. 

## 2015-12-12 NOTE — Assessment & Plan Note (Signed)
Continue current rxs- tylenol and Ibuprofen seem to help more than any other rx.

## 2015-12-12 NOTE — Assessment & Plan Note (Signed)
Family is now agreeing to hospice referral. Referral placed.

## 2015-12-12 NOTE — Patient Instructions (Signed)
Good to see you. Please call Dr. Marvel PlanMcGowan to schedule a follow up appointment.

## 2015-12-12 NOTE — Assessment & Plan Note (Signed)
She wants to get a suprapubic cath. Husband will call urology to set up an appointment.

## 2015-12-20 ENCOUNTER — Other Ambulatory Visit: Payer: Self-pay | Admitting: Family Medicine

## 2015-12-24 NOTE — Telephone Encounter (Signed)
Last f/u 11/2015 

## 2015-12-24 NOTE — Telephone Encounter (Signed)
Rx called in to requested pharmacy 

## 2015-12-25 ENCOUNTER — Telehealth: Payer: Self-pay | Admitting: Family Medicine

## 2015-12-25 NOTE — Telephone Encounter (Signed)
Ok to social work referral. I'm not clear as to what else she's asking for.

## 2015-12-25 NOTE — Telephone Encounter (Signed)
Spoke to [place] and provided verbal orders. Pt was wanting PT and social work

## 2015-12-25 NOTE — Telephone Encounter (Signed)
Mardella LaymanLindsey from [facility] called -  Pt Wants to be eval by pt, also requesting social worker referral Also wants to see if we received the fax  cb number is [phone]

## 2015-12-26 ENCOUNTER — Other Ambulatory Visit: Payer: Self-pay | Admitting: Family Medicine

## 2015-12-26 NOTE — Telephone Encounter (Signed)
Mr Jennifer [name] request refill diazepam; spoke with Alcario DroughtErica at [facility]  And rx ready for pick up now. Mr Jennifer [name] voiced understanding.

## 2015-12-27 ENCOUNTER — Telehealth: Payer: Self-pay | Admitting: Family Medicine

## 2015-12-27 NOTE — Telephone Encounter (Signed)
Ok to give verbal orders are requested.

## 2015-12-27 NOTE — Telephone Encounter (Signed)
Lm on Jennifer [name]'s vm and provided verbal orders as requested

## 2015-12-27 NOTE — Telephone Encounter (Signed)
Mardella LaymanLindsey from [facility] -called  requesting verbal orders for home nursing care extended for 60 days and verbal order to add as needed visits  cb number is [phone] Thank you

## 2016-01-01 ENCOUNTER — Telehealth: Payer: Self-pay

## 2016-01-01 NOTE — Telephone Encounter (Signed)
Mardella LaymanLindsey nurse with [facility] left v/m requesting status of urine culture [facility] Lindsey request cb; thinks pt may have UTI and request cb about possible abx.[facility].

## 2016-01-02 MED ORDER — CIPROFLOXACIN HCL 500 MG PO TABS: 500.0000 mg | ORAL_TABLET | Freq: Two times a day (BID) | ORAL | Status: DC

## 2016-01-02 NOTE — Telephone Encounter (Signed)
Results are not in Dr. Elmer SowAron's in box

## 2016-01-02 NOTE — Telephone Encounter (Signed)
Results received, UC positive pt should start Cipro 500mg  BID x 5 days #10---will send to pharmacy

## 2016-01-02 NOTE — Telephone Encounter (Signed)
Lm on Jennifer [name]'s vm and informed her results not yet received. Provided direct fax at [phone] to resend for review

## 2016-01-02 NOTE — Addendum Note (Signed)
Addended by: Roena MaladyEVONTENNO, Dakhari Zuver Y on: 01/02/2016 05:29 PM   Modules accepted: Orders

## 2016-01-11 ENCOUNTER — Inpatient Hospital Stay
Admission: EM | Admit: 2016-01-11 | Discharge: 2016-01-25 | DRG: 698 | Disposition: E | Payer: BLUE CROSS/BLUE SHIELD | Attending: Internal Medicine | Admitting: Internal Medicine

## 2016-01-11 ENCOUNTER — Emergency Department: Payer: BLUE CROSS/BLUE SHIELD

## 2016-01-11 ENCOUNTER — Other Ambulatory Visit: Payer: Self-pay

## 2016-01-11 DIAGNOSIS — N390 Urinary tract infection, site not specified: Secondary | ICD-10-CM | POA: Diagnosis present

## 2016-01-11 DIAGNOSIS — E869 Volume depletion, unspecified: Secondary | ICD-10-CM | POA: Diagnosis present

## 2016-01-11 DIAGNOSIS — Z1621 Resistance to vancomycin: Secondary | ICD-10-CM | POA: Diagnosis present

## 2016-01-11 DIAGNOSIS — R140 Abdominal distension (gaseous): Secondary | ICD-10-CM

## 2016-01-11 DIAGNOSIS — Z91013 Allergy to seafood: Secondary | ICD-10-CM

## 2016-01-11 DIAGNOSIS — E871 Hypo-osmolality and hyponatremia: Secondary | ICD-10-CM | POA: Diagnosis present

## 2016-01-11 DIAGNOSIS — A414 Sepsis due to anaerobes: Secondary | ICD-10-CM | POA: Diagnosis present

## 2016-01-11 DIAGNOSIS — A047 Enterocolitis due to Clostridium difficile: Secondary | ICD-10-CM | POA: Diagnosis present

## 2016-01-11 DIAGNOSIS — Z8249 Family history of ischemic heart disease and other diseases of the circulatory system: Secondary | ICD-10-CM

## 2016-01-11 DIAGNOSIS — Z79899 Other long term (current) drug therapy: Secondary | ICD-10-CM

## 2016-01-11 DIAGNOSIS — Z515 Encounter for palliative care: Secondary | ICD-10-CM | POA: Diagnosis not present

## 2016-01-11 DIAGNOSIS — G1221 Amyotrophic lateral sclerosis: Secondary | ICD-10-CM | POA: Diagnosis present

## 2016-01-11 DIAGNOSIS — E785 Hyperlipidemia, unspecified: Secondary | ICD-10-CM | POA: Diagnosis present

## 2016-01-11 DIAGNOSIS — A4189 Other specified sepsis: Secondary | ICD-10-CM | POA: Diagnosis not present

## 2016-01-11 DIAGNOSIS — J690 Pneumonitis due to inhalation of food and vomit: Secondary | ICD-10-CM | POA: Diagnosis not present

## 2016-01-11 DIAGNOSIS — Z885 Allergy status to narcotic agent status: Secondary | ICD-10-CM

## 2016-01-11 DIAGNOSIS — Z888 Allergy status to other drugs, medicaments and biological substances status: Secondary | ICD-10-CM

## 2016-01-11 DIAGNOSIS — Z66 Do not resuscitate: Secondary | ICD-10-CM

## 2016-01-11 DIAGNOSIS — F329 Major depressive disorder, single episode, unspecified: Secondary | ICD-10-CM | POA: Diagnosis present

## 2016-01-11 DIAGNOSIS — R6521 Severe sepsis with septic shock: Secondary | ICD-10-CM | POA: Diagnosis present

## 2016-01-11 DIAGNOSIS — G9341 Metabolic encephalopathy: Secondary | ICD-10-CM | POA: Diagnosis present

## 2016-01-11 DIAGNOSIS — T83511A Infection and inflammatory reaction due to indwelling urethral catheter, initial encounter: Principal | ICD-10-CM | POA: Diagnosis present

## 2016-01-11 DIAGNOSIS — E43 Unspecified severe protein-calorie malnutrition: Secondary | ICD-10-CM | POA: Diagnosis present

## 2016-01-11 DIAGNOSIS — E875 Hyperkalemia: Secondary | ICD-10-CM | POA: Diagnosis not present

## 2016-01-11 DIAGNOSIS — E870 Hyperosmolality and hypernatremia: Secondary | ICD-10-CM | POA: Diagnosis not present

## 2016-01-11 DIAGNOSIS — R338 Other retention of urine: Secondary | ICD-10-CM | POA: Diagnosis present

## 2016-01-11 DIAGNOSIS — A4181 Sepsis due to Enterococcus: Secondary | ICD-10-CM | POA: Diagnosis present

## 2016-01-11 DIAGNOSIS — Z87440 Personal history of urinary (tract) infections: Secondary | ICD-10-CM

## 2016-01-11 DIAGNOSIS — R7989 Other specified abnormal findings of blood chemistry: Secondary | ICD-10-CM

## 2016-01-11 DIAGNOSIS — A419 Sepsis, unspecified organism: Secondary | ICD-10-CM | POA: Diagnosis not present

## 2016-01-11 DIAGNOSIS — K219 Gastro-esophageal reflux disease without esophagitis: Secondary | ICD-10-CM | POA: Diagnosis present

## 2016-01-11 DIAGNOSIS — Z87891 Personal history of nicotine dependence: Secondary | ICD-10-CM

## 2016-01-11 DIAGNOSIS — J9602 Acute respiratory failure with hypercapnia: Secondary | ICD-10-CM | POA: Diagnosis not present

## 2016-01-11 DIAGNOSIS — J9600 Acute respiratory failure, unspecified whether with hypoxia or hypercapnia: Secondary | ICD-10-CM

## 2016-01-11 DIAGNOSIS — E874 Mixed disorder of acid-base balance: Secondary | ICD-10-CM | POA: Diagnosis not present

## 2016-01-11 DIAGNOSIS — R778 Other specified abnormalities of plasma proteins: Secondary | ICD-10-CM

## 2016-01-11 DIAGNOSIS — I248 Other forms of acute ischemic heart disease: Secondary | ICD-10-CM | POA: Diagnosis present

## 2016-01-11 DIAGNOSIS — Z681 Body mass index (BMI) 19 or less, adult: Secondary | ICD-10-CM | POA: Diagnosis not present

## 2016-01-11 DIAGNOSIS — R6520 Severe sepsis without septic shock: Secondary | ICD-10-CM

## 2016-01-11 DIAGNOSIS — R109 Unspecified abdominal pain: Secondary | ICD-10-CM | POA: Insufficient documentation

## 2016-01-11 DIAGNOSIS — N170 Acute kidney failure with tubular necrosis: Secondary | ICD-10-CM | POA: Diagnosis present

## 2016-01-11 DIAGNOSIS — M6249 Contracture of muscle, multiple sites: Secondary | ICD-10-CM | POA: Diagnosis present

## 2016-01-11 DIAGNOSIS — J9690 Respiratory failure, unspecified, unspecified whether with hypoxia or hypercapnia: Secondary | ICD-10-CM

## 2016-01-11 DIAGNOSIS — K567 Ileus, unspecified: Secondary | ICD-10-CM | POA: Diagnosis not present

## 2016-01-11 DIAGNOSIS — A0472 Enterocolitis due to Clostridium difficile, not specified as recurrent: Secondary | ICD-10-CM

## 2016-01-11 DIAGNOSIS — Z825 Family history of asthma and other chronic lower respiratory diseases: Secondary | ICD-10-CM

## 2016-01-11 DIAGNOSIS — N179 Acute kidney failure, unspecified: Secondary | ICD-10-CM

## 2016-01-11 DIAGNOSIS — T17908A Unspecified foreign body in respiratory tract, part unspecified causing other injury, initial encounter: Secondary | ICD-10-CM

## 2016-01-11 DIAGNOSIS — J9601 Acute respiratory failure with hypoxia: Secondary | ICD-10-CM | POA: Diagnosis not present

## 2016-01-11 DIAGNOSIS — L89152 Pressure ulcer of sacral region, stage 2: Secondary | ICD-10-CM | POA: Diagnosis not present

## 2016-01-11 DIAGNOSIS — IMO0002 Reserved for concepts with insufficient information to code with codable children: Secondary | ICD-10-CM

## 2016-01-11 DIAGNOSIS — I959 Hypotension, unspecified: Secondary | ICD-10-CM

## 2016-01-11 DIAGNOSIS — Z818 Family history of other mental and behavioral disorders: Secondary | ICD-10-CM

## 2016-01-11 DIAGNOSIS — L89153 Pressure ulcer of sacral region, stage 3: Secondary | ICD-10-CM | POA: Diagnosis present

## 2016-01-11 DIAGNOSIS — Y846 Urinary catheterization as the cause of abnormal reaction of the patient, or of later complication, without mention of misadventure at the time of the procedure: Secondary | ICD-10-CM | POA: Diagnosis present

## 2016-01-11 DIAGNOSIS — R1312 Dysphagia, oropharyngeal phase: Secondary | ICD-10-CM | POA: Diagnosis present

## 2016-01-11 DIAGNOSIS — R0989 Other specified symptoms and signs involving the circulatory and respiratory systems: Secondary | ICD-10-CM

## 2016-01-11 DIAGNOSIS — Z7401 Bed confinement status: Secondary | ICD-10-CM

## 2016-01-11 DIAGNOSIS — E876 Hypokalemia: Secondary | ICD-10-CM | POA: Diagnosis not present

## 2016-01-11 DIAGNOSIS — D638 Anemia in other chronic diseases classified elsewhere: Secondary | ICD-10-CM | POA: Diagnosis present

## 2016-01-11 DIAGNOSIS — F3289 Other specified depressive episodes: Secondary | ICD-10-CM | POA: Diagnosis not present

## 2016-01-11 LAB — CBC WITH DIFFERENTIAL/PLATELET
BASOS ABS: 0 10*3/uL (ref 0–0.1)
EOS ABS: 0.1 10*3/uL (ref 0–0.7)
Eosinophils Relative: 1 %
HCT: 33.9 % — ABNORMAL LOW (ref 35.0–47.0)
Hemoglobin: 11.3 g/dL — ABNORMAL LOW (ref 12.0–16.0)
Lymphocytes Relative: 5 %
Lymphs Abs: 0.6 10*3/uL — ABNORMAL LOW (ref 1.0–3.6)
MCH: 31.3 pg (ref 26.0–34.0)
MCHC: 33.4 g/dL (ref 32.0–36.0)
MCV: 93.7 fL (ref 80.0–100.0)
Monocytes Absolute: 0.2 10*3/uL (ref 0.2–0.9)
Monocytes Relative: 2 %
NEUTROS ABS: 10.9 10*3/uL — AB (ref 1.4–6.5)
PLATELETS: 183 10*3/uL (ref 150–440)
RBC: 3.61 MIL/uL — AB (ref 3.80–5.20)
RDW: 13.3 % (ref 11.5–14.5)
WBC: 11.9 10*3/uL — AB (ref 3.6–11.0)

## 2016-01-11 LAB — COMPREHENSIVE METABOLIC PANEL
ALBUMIN: 2.5 g/dL — AB (ref 3.5–5.0)
ALT: 43 U/L (ref 14–54)
ANION GAP: 14 (ref 5–15)
AST: 39 U/L (ref 15–41)
Alkaline Phosphatase: 58 U/L (ref 38–126)
BILIRUBIN TOTAL: 1 mg/dL (ref 0.3–1.2)
BUN: 51 mg/dL — ABNORMAL HIGH (ref 6–20)
CHLORIDE: 103 mmol/L (ref 101–111)
CO2: 14 mmol/L — ABNORMAL LOW (ref 22–32)
Calcium: 8 mg/dL — ABNORMAL LOW (ref 8.9–10.3)
Creatinine, Ser: 2.71 mg/dL — ABNORMAL HIGH (ref 0.44–1.00)
GFR calc Af Amer: 22 mL/min — ABNORMAL LOW (ref >60)
GFR, EST NON AFRICAN AMERICAN: 19 mL/min — AB (ref >60)
Glucose, Bld: 81 mg/dL (ref 65–99)
POTASSIUM: 5.1 mmol/L (ref 3.5–5.1)
Sodium: 131 mmol/L — ABNORMAL LOW (ref 135–145)
TOTAL PROTEIN: 5.1 g/dL — AB (ref 6.5–8.1)

## 2016-01-11 LAB — URINALYSIS COMPLETE WITH MICROSCOPIC (ARMC ONLY)
Bilirubin Urine: NEGATIVE
Glucose, UA: NEGATIVE mg/dL
Hgb urine dipstick: NEGATIVE
Nitrite: NEGATIVE
PROTEIN: 30 mg/dL — AB
SPECIFIC GRAVITY, URINE: 1.026 (ref 1.005–1.030)
pH: 5 (ref 5.0–8.0)

## 2016-01-11 LAB — PROTIME-INR
INR: 1.16
PROTHROMBIN TIME: 15 s (ref 11.4–15.0)

## 2016-01-11 LAB — LACTIC ACID, PLASMA
LACTIC ACID, VENOUS: 1.9 mmol/L (ref 0.5–2.0)
LACTIC ACID, VENOUS: 2 mmol/L (ref 0.5–2.0)

## 2016-01-11 LAB — GLUCOSE, CAPILLARY: Glucose-Capillary: 88 mg/dL (ref 65–99)

## 2016-01-11 LAB — LIPASE, BLOOD: LIPASE: 16 U/L (ref 11–51)

## 2016-01-11 LAB — APTT: APTT: 36 s (ref 24–36)

## 2016-01-11 LAB — TROPONIN I: Troponin I: 0.16 ng/mL — ABNORMAL HIGH (ref <0.031)

## 2016-01-11 MED ORDER — DEXTROMETHORPHAN-QUINIDINE 20-10 MG PO CAPS
1.0000 | ORAL_CAPSULE | Freq: Two times a day (BID) | ORAL | Status: DC
  Administered 2016-01-14: 1 via ORAL
  Filled 2016-01-11 (×4): qty 1

## 2016-01-11 MED ORDER — PIPERACILLIN-TAZOBACTAM 3.375 G IVPB
INTRAVENOUS | Status: AC
  Filled 2016-01-11: qty 50

## 2016-01-11 MED ORDER — FAMOTIDINE 20 MG PO TABS: 10.0000 mg | ORAL_TABLET | Freq: Every day | ORAL | Status: DC

## 2016-01-11 MED ORDER — SODIUM CHLORIDE 0.9 % IV SOLN
Freq: Once | INTRAVENOUS | Status: AC
  Administered 2016-01-11: 21:00:00 via INTRAVENOUS

## 2016-01-11 MED ORDER — TIZANIDINE HCL 4 MG PO TABS
2.0000 mg | ORAL_TABLET | Freq: Every day | ORAL | Status: DC
  Administered 2016-01-13 – 2016-01-14 (×2): 2 mg via ORAL
  Filled 2016-01-11 (×2): qty 1

## 2016-01-11 MED ORDER — NOREPINEPHRINE BITARTRATE 1 MG/ML IV SOLN
0.0000 ug/min | Freq: Once | INTRAVENOUS | Status: AC
  Administered 2016-01-11: 5 ug/min via INTRAVENOUS
  Filled 2016-01-11: qty 4

## 2016-01-11 MED ORDER — BUPROPION HCL ER (SR) 150 MG PO TB12
300.0000 mg | ORAL_TABLET | Freq: Two times a day (BID) | ORAL | Status: DC
  Administered 2016-01-13: 300 mg via ORAL
  Filled 2016-01-11: qty 2

## 2016-01-11 MED ORDER — SODIUM CHLORIDE 0.9 % IV BOLUS (SEPSIS)
1000.0000 mL | Freq: Once | INTRAVENOUS | Status: AC
  Administered 2016-01-11: 1000 mL via INTRAVENOUS

## 2016-01-11 MED ORDER — SODIUM CHLORIDE 0.9 % IV BOLUS (SEPSIS)
250.0000 mL | Freq: Once | INTRAVENOUS | Status: AC
  Administered 2016-01-11: 250 mL via INTRAVENOUS

## 2016-01-11 MED ORDER — LEVETIRACETAM 500 MG PO TABS: 500.0000 mg | ORAL_TABLET | Freq: Two times a day (BID) | ORAL | Status: DC

## 2016-01-11 MED ORDER — ATROPINE SULFATE 1 % OP SOLN
2.0000 [drp] | OPHTHALMIC | Status: DC | PRN
  Filled 2016-01-11: qty 2

## 2016-01-11 MED ORDER — PIPERACILLIN-TAZOBACTAM 3.375 G IVPB
3.3750 g | Freq: Two times a day (BID) | INTRAVENOUS | Status: DC
  Administered 2016-01-12: 3.375 g via INTRAVENOUS

## 2016-01-11 MED ORDER — DIAZEPAM 5 MG PO TABS: 10.0000 mg | ORAL_TABLET | Freq: Four times a day (QID) | ORAL | Status: DC | PRN

## 2016-01-11 MED ORDER — SODIUM CHLORIDE 0.9 % IV SOLN
INTRAVENOUS | Status: DC
  Administered 2016-01-11: 23:00:00 via INTRAVENOUS

## 2016-01-11 MED ORDER — VANCOMYCIN HCL 500 MG IV SOLR
500.0000 mg | INTRAVENOUS | Status: DC
  Administered 2016-01-12: 500 mg via INTRAVENOUS
  Filled 2016-01-11: qty 500

## 2016-01-11 MED ORDER — PANTOPRAZOLE SODIUM 40 MG PO TBEC: 40.0000 mg | DELAYED_RELEASE_TABLET | Freq: Every day | ORAL | Status: DC

## 2016-01-11 MED ORDER — METRONIDAZOLE 250 MG PO TABS: 500.0000 mg | ORAL_TABLET | Freq: Four times a day (QID) | ORAL | Status: DC

## 2016-01-11 MED ORDER — PIPERACILLIN-TAZOBACTAM 3.375 G IVPB 30 MIN
3.3750 g | Freq: Once | INTRAVENOUS | Status: AC
  Administered 2016-01-11: 3.375 g via INTRAVENOUS

## 2016-01-11 MED ORDER — HEPARIN SODIUM (PORCINE) 5000 UNIT/ML IJ SOLN
5000.0000 [IU] | Freq: Three times a day (TID) | INTRAMUSCULAR | Status: DC
  Administered 2016-01-12: 5000 [IU] via SUBCUTANEOUS
  Filled 2016-01-11: qty 1

## 2016-01-11 MED ORDER — PIPERACILLIN-TAZOBACTAM 3.375 G IVPB
3.3750 g | Freq: Three times a day (TID) | INTRAVENOUS | Status: DC
  Filled 2016-01-11 (×2): qty 50

## 2016-01-11 MED ORDER — VANCOMYCIN HCL 500 MG IV SOLR
500.0000 mg | Freq: Once | INTRAVENOUS | Status: AC
  Administered 2016-01-11: 500 mg via INTRAVENOUS
  Filled 2016-01-11: qty 500

## 2016-01-11 NOTE — ED Notes (Signed)
Dr. Letitia LibraJohnston in to speak with pt and family regarding vasopressor and central line.

## 2016-01-11 NOTE — Consult Note (Addendum)
Pharmacy Antibiotic Note  Jennifer [name] is a 55 y.o. female admitted on 01/15/2016 with sepsis.  Pharmacy has been consulted for vancomycin and zosyn dosing.  Plan: Pt received 500mg  in the ED. Will give next dose in 8 hours for stacked dosing Vancomycin 500 IV every 48 hours.  Goal trough 15-20 mcg/mL. Zosyn 3.375g IV q12h (4 hour infusion). Will draw trough at steady state 6/26 @ 0230 Will recheck renal function in the AM, will adjust dose if renal function improves   Weight: 81 lb 2 oz (36.798 kg)  Temp (24hrs), Avg:97.3 F (36.3 C), Min:97.3 F (36.3 C), Max:97.3 F (36.3 C)   Recent Labs Lab 01/19/2016 1909  WBC 11.9*  CREATININE 2.71*  LATICACIDVEN 1.9    Estimated Creatinine Clearance: 13.6 mL/min (by C-G formula based on Cr of 2.71).    Allergies  Allergen Reactions  . Bee Venom Anaphylaxis  . Hydrocodone Other (See Comments)    Reaction:  Muscle stiffness   . Lipitor [Atorvastatin] Other (See Comments)    Reaction:  Muscle cramps   . Shellfish Allergy Anaphylaxis  . Oxycodone Other (See Comments)    Reaction:  Muscle stiffness   . Prednisone Other (See Comments)    Reaction:  Psychosis   . Vicodin [Hydrocodone-Acetaminophen] Other (See Comments)    Reaction:  Muscle stiffness   . Oxycontin [Oxycodone Hcl] Other (See Comments)    Reaction:  Muscle stiffness   . Peanut-Containing Drug Products Diarrhea    Antimicrobials this admission: vancomycin 6/17 >>  zosyn 6/17 >>   Dose adjustments this admission:   Microbiology results: 6/17 BCx: pending 6/17 UCx: pending  6/17: cdiff 6/17: GI panel  Thank you for allowing pharmacy to be a part of this patient's care.  Olene FlossMelissa D Maccia 01/04/2016 9:09 PM

## 2016-01-11 NOTE — ED Notes (Signed)
Pt, family and dr. Letitia LibraJohnston continue to discuss pressors and central line placement.

## 2016-01-11 NOTE — ED Notes (Signed)
Spoke with dr. Laural BenesJohnson regarding pt's hypotension, order for ns at 11650mL/hr received. md to place order in computer. Ns initiated at this time at 17450ml/hr.

## 2016-01-11 NOTE — ED Notes (Signed)
Pt now hypertensive with levophed infusion. Dr. Letitia LibraJohnston notified, states to decrease levophed to 852mcg/min. Rate changed.

## 2016-01-11 NOTE — ED Notes (Addendum)
Patient observed resting in bed. HYPOtensive with a reading of 67/48; primary nurse aware. Patient currently receiving a fluid bolus; no plans to start vasopressors at this time per report from primary nurse. Patient does not appear to be in any distress. CCM in place. SR at a rate of 76 noted on the monitor. Family member at bedside.

## 2016-01-11 NOTE — H&P (Signed)
Jennifer [name] is an 55 y.o. female.   Chief Complaint: Low blood pressure and less responsive HPI: Presents with 1- 2 days of being less responsive. Noted to have low BP with SBP in 60's today. Patient is arousable but most history comes from husband. Found to have UTI in ED. Pt had hx of posative c. Dif antigen but negative toxin about 2 months ago treated with vanc. Husband states she started having diarrhea today.  Past Medical History  Diagnosis Date  . ALS (amyotrophic lateral sclerosis) ([place])   . Depression   . GERD (gastroesophageal reflux disease)   . Urinary retention   . Dysphagia, oropharyngeal 11/28/2013  . CN (constipation) 11/28/2013    Past Surgical History  Procedure Laterality Date  . Cesarean section    . Nasal sinus surgery      Family History  Problem Relation Age of Onset  . COPD Mother   . Thyroid disease Mother   . Diverticulosis Mother   . Heart attack Mother   . Gout Father   . Depression Sister   . Thyroid disease Sister   . Cancer Brother     kidney  . Heart attack Maternal Grandmother   . Heart attack Maternal Grandfather   . Heart attack Paternal Grandfather   . Hyperlipidemia Sister   . Yves Dill Parkinson White syndrome Sister 25  . Depression Brother   . Hypertension Brother    Social History:  reports that she quit smoking about 8 years ago. She has never used smokeless tobacco. She reports that she does not drink alcohol or use illicit drugs.  Allergies:  Allergies  Allergen Reactions  . Bee Venom Anaphylaxis  . Hydrocodone Other (See Comments)    Reaction:  Muscle stiffness   . Lipitor [Atorvastatin] Other (See Comments)    Reaction:  Muscle cramps   . Shellfish Allergy Anaphylaxis  . Oxycodone Other (See Comments)    Reaction:  Muscle stiffness   . Prednisone Other (See Comments)    Reaction:  Psychosis   . Vicodin [Hydrocodone-Acetaminophen] Other (See Comments)    Reaction:  Muscle stiffness   . Oxycontin [Oxycodone Hcl] Other  (See Comments)    Reaction:  Muscle stiffness   . Peanut-Containing Drug Products Diarrhea     (Not in a hospital admission)  Results for orders placed or performed during the hospital encounter of 01/05/2016 (from the past 48 hour(s))  Lactic acid, plasma     Status: None   Collection Time: 01/17/2016  7:09 PM  Result Value Ref Range   Lactic Acid, Venous 1.9 0.5 - 2.0 mmol/L  Comprehensive metabolic panel     Status: Abnormal   Collection Time: 01/05/2016  7:09 PM  Result Value Ref Range   Sodium 131 (L) 135 - 145 mmol/L   Potassium 5.1 3.5 - 5.1 mmol/L    Comment: HEMOLYSIS AT THIS LEVEL MAY AFFECT RESULT   Chloride 103 101 - 111 mmol/L   CO2 14 (L) 22 - 32 mmol/L   Glucose, Bld 81 65 - 99 mg/dL   BUN 51 (H) 6 - 20 mg/dL   Creatinine, Ser 2.71 (H) 0.44 - 1.00 mg/dL   Calcium 8.0 (L) 8.9 - 10.3 mg/dL   Total Protein 5.1 (L) 6.5 - 8.1 g/dL   Albumin 2.5 (L) 3.5 - 5.0 g/dL   AST 39 15 - 41 U/L    Comment: HEMOLYSIS AT THIS LEVEL MAY AFFECT RESULT   ALT 43 14 - 54 U/L  Comment: HEMOLYSIS AT THIS LEVEL MAY AFFECT RESULT   Alkaline Phosphatase 58 38 - 126 U/L   Total Bilirubin 1.0 0.3 - 1.2 mg/dL    Comment: HEMOLYSIS AT THIS LEVEL MAY AFFECT RESULT   GFR calc non Af Amer 19 (L) >60 mL/min   GFR calc Af Amer 22 (L) >60 mL/min    Comment: (NOTE) The eGFR has been calculated using the CKD EPI equation. This calculation has not been validated in all clinical situations. eGFR's persistently <60 mL/min signify possible Chronic Kidney Disease.    Anion gap 14 5 - 15  Lipase, blood     Status: None   Collection Time: 01/02/2016  7:09 PM  Result Value Ref Range   Lipase 16 11 - 51 U/L  Troponin I     Status: Abnormal   Collection Time: 01/19/2016  7:09 PM  Result Value Ref Range   Troponin I 0.16 (H) <0.031 ng/mL    Comment: READ BACK AND VERIFIED WITH [facility] RN AT 1945 12/28/2015 MSS.        PERSISTENTLY INCREASED TROPONIN VALUES IN THE RANGE OF 0.04-0.49 ng/mL CAN BE SEEN  IN:       -UNSTABLE ANGINA       -CONGESTIVE HEART FAILURE       -MYOCARDITIS       -CHEST TRAUMA       -ARRYHTHMIAS       -LATE PRESENTING MYOCARDIAL INFARCTION       -COPD   CLINICAL FOLLOW-UP RECOMMENDED.   CBC WITH DIFFERENTIAL     Status: Abnormal   Collection Time: 01/07/2016  7:09 PM  Result Value Ref Range   WBC 11.9 (H) 3.6 - 11.0 K/uL   RBC 3.61 (L) 3.80 - 5.20 MIL/uL   Hemoglobin 11.3 (L) 12.0 - 16.0 g/dL   HCT 33.9 (L) 35.0 - 47.0 %   MCV 93.7 80.0 - 100.0 fL   MCH 31.3 26.0 - 34.0 pg   MCHC 33.4 32.0 - 36.0 g/dL   RDW 13.3 11.5 - 14.5 %   Platelets 183 150 - 440 K/uL   Neutrophils Relative % 92% %   Neutro Abs 10.9 (H) 1.4 - 6.5 K/uL   Lymphocytes Relative 5% %   Lymphs Abs 0.6 (L) 1.0 - 3.6 K/uL   Monocytes Relative 2% %   Monocytes Absolute 0.2 0.2 - 0.9 K/uL   Eosinophils Relative 1% %   Eosinophils Absolute 0.1 0 - 0.7 K/uL   Basophils Relative 0% %   Basophils Absolute 0.0 0 - 0.1 K/uL   Smear Review MORPHOLOGY UNREMARKABLE   APTT     Status: None   Collection Time: 12/26/2015  7:09 PM  Result Value Ref Range   aPTT 36 24 - 36 seconds  Protime-INR     Status: None   Collection Time: 01/18/2016  7:09 PM  Result Value Ref Range   Prothrombin Time 15.0 11.4 - 15.0 seconds   INR 1.16   Urinalysis complete, with microscopic ([facility] only)     Status: Abnormal   Collection Time: 01/03/2016  7:20 PM  Result Value Ref Range   Color, Urine AMBER (A) YELLOW   APPearance HAZY (A) CLEAR   Glucose, UA NEGATIVE NEGATIVE mg/dL   Bilirubin Urine NEGATIVE NEGATIVE   Ketones, ur TRACE (A) NEGATIVE mg/dL   Specific Gravity, Urine 1.026 1.005 - 1.030   Hgb urine dipstick NEGATIVE NEGATIVE   pH 5.0 5.0 - 8.0   Protein, ur 30 (A) NEGATIVE mg/dL  Nitrite NEGATIVE NEGATIVE   Leukocytes, UA 2+ (A) NEGATIVE   RBC / HPF 6-30 0 - 5 RBC/hpf   WBC, UA 6-30 0 - 5 WBC/hpf   Bacteria, UA FEW (A) NONE SEEN   Squamous Epithelial / LPF 0-5 (A) NONE SEEN   Trans Epithel, UA <1     Mucous PRESENT    Hyaline Casts, UA PRESENT    Ca Oxalate Crys, UA PRESENT    Dg Chest Port 1 View  01/06/2016  CLINICAL DATA:  Sepsis.  Hypotension. EXAM: PORTABLE CHEST 1 VIEW COMPARISON:  09/23/2015 FINDINGS: Lung volumes are low. Diffuse patchy opacity throughout the left hemithorax. Improved right basilar aeration from prior exam. Minimal right infrahilar atelectasis. No definite pleural effusion. Heart size and mediastinal contours are unchanged. No pneumothorax. Gaseous distention of bowel loops in the upper abdomen, similar to prior. Bones appear under mineralized. IMPRESSION: Patchy opacities throughout the left lung, pneumonia favored over asymmetric pulmonary edema but Electronically Signed   By: Jeb Levering M.D.   On: 01/24/2016 20:05    Review of Systems  Constitutional: Positive for fever.  HENT: Negative for hearing loss.   Eyes: Negative for blurred vision.  Respiratory: Negative for cough.   Cardiovascular: Negative for chest pain.  Gastrointestinal: Positive for diarrhea. Negative for nausea.  Genitourinary: Positive for dysuria.  Musculoskeletal: Negative for joint pain.  Skin: Negative for rash.  Neurological: Positive for sensory change.    Blood pressure 75/62, pulse 70, temperature 97.3 F (36.3 C), temperature source Axillary, resp. rate 20, weight 36.798 kg (81 lb 2 oz), SpO2 96 %. Physical Exam  Constitutional:  Cachetic looking critically ill appearing female  HENT:  Head: Normocephalic.  Oral mucosa dry  Eyes: EOM are normal. Pupils are equal, round, and reactive to light.  Neck: Neck supple. No JVD present. No tracheal deviation present. No thyromegaly present.  Cardiovascular: Normal rate.   No murmur heard. Respiratory:  CTA No dullness to percusion. No use of accessary muscles  GI: Soft. Bowel sounds are normal. She exhibits no distension and no mass.  Musculoskeletal: She exhibits no edema.  Lymphadenopathy:    She has no cervical  adenopathy.  Neurological:  Lethargic. Mild contractions in upper and lower ext.     Assessment/Plan 1. Septic Shock: Patient has been give 3 liters of fluids as per sepsis protocal in the ED. SBP remains in 70's. Pt only weighes 75 pounds. Patient is DNR and husband states she does not want any central lines or pressures. Will continue IV abx, restart c. Diff treatment. Continue aggressive fluid resusitation but have to be cautious of fluid overload given her size.   2. Acute Renal failure: Secondary to sepsis. Hydrate aggressively and repeat renal fx panel in am.  3. UTI: Culture urine. On IV zosyn and vanc.  4. Possible C. Diff Colitis: Check stool. Statr PO flagyl:  5. Hyperkalemia: Stop K. Recheck  Time= 45 min  Baxter Hire, MD 01/20/2016, 8:45 PM

## 2016-01-11 NOTE — ED Notes (Signed)
Per EMS , BP was 66/40 initially, given 500 mL bolus

## 2016-01-11 NOTE — ED Notes (Signed)
Notified Dr. York CeriseForbach of critical lab value - Troponin 0.16.  Acknowledged.

## 2016-01-11 NOTE — ED Notes (Signed)
Pt with continued hypotension post triage. Dr. York Ceriseforbach at bedside discussing care with pt's spouse. Plan per md is for continued NS bolus, no plans for vasopressors at this time per request of pt's spouse per dr. York CeriseForbach.

## 2016-01-11 NOTE — ED Notes (Signed)
Pt from home via EMS, reports recurrent UTI shes currently being treated for and C.diff. Pt has indwelling catheter present
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
## 2016-01-11 NOTE — Consult Note (Signed)
PULMONARY / CRITICAL CARE MEDICINE   Name: Keni Wafer MRN: [id number] DOB: 1961-04-09    ADMISSION DATE:  12/27/2015   CONSULTATION DATE:  01/03/2016  REFERRING MD:  Hospitalist  CHIEF COMPLAINT:  Hypotension  HISTORY OF PRESENT ILLNESS:   This is a 55 year old Caucasian female with a history of amyotrophic lateral sclerosis (ALS), depression, GERD, dysphagia, and frequent UTIs who presents with fever, altered mental status and hypotension. Patient's husband states that it all started on Thursday when she became less responsive and was found to have a UTI. She was started on ciprofloxacin by her PCP and on 01/01/2016 she became more weak and less arousable hence she was brought to the ED for evaluation. At the ED, she was given IV fluids, but she remained hypotensive hence PCCM was consulted for further management. Husband states that patient has also been having intermittent loose stools. She was recently treated for C. difficile with oral vancomycin. She continues to have loose stools and complaining of abdominal cramping.  PAST MEDICAL HISTORY :  She  has a past medical history of ALS (amyotrophic lateral sclerosis) (HCC); Depression; GERD (gastroesophageal reflux disease); Urinary retention; Dysphagia, oropharyngeal (11/28/2013); and CN (constipation) (11/28/2013).  PAST SURGICAL HISTORY: She  has past surgical history that includes Cesarean section and Nasal sinus surgery.  Allergies  Allergen Reactions  . Bee Venom Anaphylaxis  . Hydrocodone Other (See Comments)    Reaction:  Muscle stiffness   . Lipitor [Atorvastatin] Other (See Comments)    Reaction:  Muscle cramps   . Shellfish Allergy Anaphylaxis  . Oxycodone Other (See Comments)    Reaction:  Muscle stiffness   . Prednisone Other (See Comments)    Reaction:  Psychosis   . Vicodin [Hydrocodone-Acetaminophen] Other (See Comments)    Reaction:  Muscle stiffness   . Oxycontin [Oxycodone Hcl] Other (See Comments)   Reaction:  Muscle stiffness   . Peanut-Containing Drug Products Diarrhea    No current facility-administered medications on file prior to encounter.   Current Outpatient Prescriptions on File Prior to Encounter  Medication Sig  . atropine 1 % ophthalmic solution Place 2 drops under the tongue as needed (for excessive secretions).  [name] buPROPion (WELLBUTRIN SR) 150 MG 12 hr tablet Take 300 mg by mouth 2 (two) times daily.  . cholecalciferol (VITAMIN D) 1000 UNITS tablet Take 1,000 Units by mouth daily.  . ciprofloxacin (CIPRO) 500 MG tablet Take 1 tablet (500 mg total) by mouth 2 (two) times daily.  [name] Dextromethorphan-Quinidine (NUEDEXTA) 20-10 MG CAPS Take 1 capsule by mouth 2 (two) times daily.   . diazepam (VALIUM) 10 MG tablet TAKE ONE TABLET BY MOUTH EVERY 6 HOURS AS NEEDED  . fluticasone (FLONASE) 50 MCG/ACT nasal spray Place 2 sprays into both nostrils daily as needed for rhinitis.  [name] ibuprofen (ADVIL,MOTRIN) 200 MG tablet Take 800 mg by mouth 3 (three) times daily as needed for mild pain.   [name] lactulose (CHRONULAC) 10 GM/15ML solution Take 15 mLs (10 g total) by mouth 3 (three) times daily as needed for mild constipation.  . levETIRAcetam (KEPPRA) 500 MG tablet Take 500 mg by mouth 2 (two) times daily.   [name] loratadine (CLARITIN) 10 MG tablet Take 10 mg by mouth daily as needed for allergies.   [name] omeprazole (PRILOSEC) 40 MG capsule Take 40 mg by mouth daily.   . potassium chloride 20 MEQ/15ML (10%) SOLN Take 20 mEq by mouth daily.  . ranitidine (ZANTAC) 150 MG tablet Take 150 mg by mouth daily  as needed for heartburn.  . tizanidine (ZANAFLEX) 2 MG capsule Take 2 mg by mouth at bedtime.     FAMILY HISTORY:  Her indicated that her mother is deceased. She indicated that her father is alive. She indicated that one of her two sisters is deceased.   SOCIAL HISTORY: She  reports that she quit smoking about 8 years ago. She has never used smokeless tobacco. She reports that she does not drink  alcohol or use illicit drugs.  REVIEW OF SYSTEMS:   Limited due to patient's mental status. She nods in approval to abdominal pain, lower extremity pain and back pain. She denies chest pain and difficulty breathing  SUBJECTIVE:    VITAL SIGNS: BP 99/58 mmHg  Pulse 69  Temp(Src) 97.3 F (36.3 C) (Axillary)  Resp 22  Wt 81 lb 2 oz (36.798 kg)  SpO2 96%  HEMODYNAMICS:    VENTILATOR SETTINGS:    INTAKE / OUTPUT:    PHYSICAL EXAMINATION: General: Cachectic and acutely ill-looking Neuro: Awake, follows some basic commands, mourns and groans HEENT: PERRLA, oral mucosa dry Cardiovascular: Apical pulse regular, S1, S2 audible, no murmur, regurg or gallop  Lungs: Normal work of breathing, bilateral airflow, breath sounds diminished in the bases Abdomen:  Mildly distended, hypoactive bowel sounds, diffuse pain on palpation Musculoskeletal: Multiple contractures in upper and lower extremities Extremities: +2 pulses, no edema Skin: Warm and dry  LABS:  BMET  Recent Labs Lab 01/17/2016 1909  NA 131*  K 5.1  CL 103  CO2 14*  BUN 51*  CREATININE 2.71*  GLUCOSE 81    Electrolytes  Recent Labs Lab 01/04/2016 1909  CALCIUM 8.0*    CBC  Recent Labs Lab 01/21/2016 1909  WBC 11.9*  HGB 11.3*  HCT 33.9*  PLT 183    Coag's  Recent Labs Lab 01/02/2016 1909  APTT 36  INR 1.16    Sepsis Markers  Recent Labs Lab 01/02/2016 1909  LATICACIDVEN 1.9    ABG No results for input(s): PHART, PCO2ART, PO2ART in the last 168 hours.  Liver Enzymes  Recent Labs Lab 12/27/2015 1909  AST 39  ALT 43  ALKPHOS 58  BILITOT 1.0  ALBUMIN 2.5*    Cardiac Enzymes  Recent Labs Lab 01/18/2016 1909  TROPONINI 0.16*    Glucose No results for input(s): GLUCAP in the last 168 hours.  Imaging Dg Chest Port 1 View  12/26/2015  CLINICAL DATA:  Sepsis.  Hypotension. EXAM: PORTABLE CHEST 1 VIEW COMPARISON:  09/23/2015 FINDINGS: Lung volumes are low. Diffuse patchy opacity  throughout the left hemithorax. Improved right basilar aeration from prior exam. Minimal right infrahilar atelectasis. No definite pleural effusion. Heart size and mediastinal contours are unchanged. No pneumothorax. Gaseous distention of bowel loops in the upper abdomen, similar to prior. Bones appear under mineralized. IMPRESSION: Patchy opacities throughout the left lung, pneumonia favored over asymmetric pulmonary edema but Electronically Signed   By: Rubye OaksMelanie  Ehinger M.D.   On: 01/14/2016 20:05    STUDIES:  None  CULTURES: Urine culture Blood cultures 2 C. difficile toxin-positive  ANTIBIOTICS: Vancomycin IV 06/17 - 06/18 Flagyl IV 06/17 >>  Zosyn 06/17 >>  Vancomycin oral 06/17 >>   SIGNIFICANT EVENTS: 01/12/2016: ED with altered mental status, fever and decreased oral intake, admitted with UTI, AKA, I urosepsis, septic shock  LINES/TUBES: Peripheral IVs  DISCUSSION: 55 year old frail Caucasian female presenting with urosepsis, septic shock, AKA secondary to volume depletion and recurrent C. difficile colitis  ASSESSMENT / PLAN:  PULMONARY A:  No acute issues-currently on room air P:   -Supplemental oxygen if needed  CARDIOVASCULAR A:  Septic shock P:  -IV fluid resuscitation -Low-dose Levophed to maintain map greater than 65 -Hemodynamics per ICU  RENAL A:   AKI secondary to volume depletion Frequent UTIs History of urinary retention-Foley P:   -IV fluid resuscitation -Trend creatinine -Monitor and replace electrolytes -Empiric antibiotics -Foley care per protocol  GASTROINTESTINAL A:   Recurrent C. difficile colitis Abdominal pain Dysphagia Possible ileus History of GERD P:   -Flagyl and vancomycin. -Contact precautions -Aspiration precautions and dysphagia diet -Pepcid twice a day  HEMATOLOGIC A:   Mild anemia without blood loss P:  -Trend CBC  INFECTIOUS A:   Urosepsis P:   -Continue broad-spectrum antibiotics. -Follow-up  cultures  ENDOCRINE A:   No acute issues  P:   [place] monitor blood glucose with BMP  NEUROLOGIC A:   ALS-end-stage with significant debility Depression P:   -Resume all home meds -Valium when necessary for muscle spasms  Disposition and family update: Family at bedside. Updated on current treatment plan. All questions answered  Best Practice: Code Status: DNR/DNI Diet: NPO GI prophylaxis:  H2 blocker VTE prophylaxis:  SCD's / heparin.    [facility] ANP-BC Pulmonary and Critical Care Medicine [facility] Pager [phone] or [phone]  2016-01-17, 11:12 PM  PCCM ATTENDING ATTESTATION:  I have evaluated patient with ANP Luci Bank, reviewed database in its entirety and discussed care plan in detail.  Major problems addressed by PCCM team: Severe chronic debilitation due to advanced ALS Chronic indwelling Foley Pyuria C diff colitis - this is the likely cause of her critical illness Severe sepsis/hypotension AKI - Cr improving   PLAN/REC: Wean NE to off for MAP > 60 mmHg Needs pediatric cuff!! Her BP measurements are likely falsely low DC IV vanc Cont enteral vanc and IV metronidazole for C diff Cont pip-tazo for complicated UTI - note hx of VRE  Husband updated @ bedside  Billy Fischer, MD PCCM service Mobile [phone] Pager [phone]

## 2016-01-11 NOTE — Progress Notes (Signed)
Called by nurse to speak with husband. Patient more alert now. Patient and husband now have decided that they want pressures and central line if needed. Patient expresses understanding of this. Have started levophed via peripheral line for now. Will likely need central line one in ICU. Will transfer admission to ICU team.  Time spent 30 min.

## 2016-01-11 NOTE — ED Notes (Signed)
Pt with blood pressure systolic of 54. Per pt's husband, pt now wishes to have vasopressors and central line placement. Dr. Letitia LibraJohnston notified.

## 2016-01-11 NOTE — ED Notes (Signed)
Spoke with dr. York Ceriseforbach regarding continued hypotension 67/39. Order for additional ns bolus received.

## 2016-01-11 NOTE — ED Notes (Signed)
hospitalist in to see pt.

## 2016-01-12 ENCOUNTER — Inpatient Hospital Stay: Payer: BLUE CROSS/BLUE SHIELD

## 2016-01-12 LAB — GASTROINTESTINAL PANEL BY PCR, STOOL (REPLACES STOOL CULTURE)

## 2016-01-12 LAB — BASIC METABOLIC PANEL
Anion gap: 10 (ref 5–15)
BUN: 46 mg/dL — ABNORMAL HIGH (ref 6–20)
CALCIUM: 7.6 mg/dL — AB (ref 8.9–10.3)
CO2: 13 mmol/L — ABNORMAL LOW (ref 22–32)
CREATININE: 1.69 mg/dL — AB (ref 0.44–1.00)
Chloride: 113 mmol/L — ABNORMAL HIGH (ref 101–111)
GFR, EST AFRICAN AMERICAN: 38 mL/min — AB (ref >60)
GFR, EST NON AFRICAN AMERICAN: 33 mL/min — AB (ref >60)
Glucose, Bld: 100 mg/dL — ABNORMAL HIGH (ref 65–99)
Potassium: 3.6 mmol/L (ref 3.5–5.1)
SODIUM: 136 mmol/L (ref 135–145)

## 2016-01-12 LAB — CBC
HCT: 34.9 % — ABNORMAL LOW (ref 35.0–47.0)
Hemoglobin: 11.6 g/dL — ABNORMAL LOW (ref 12.0–16.0)
MCH: 31.3 pg (ref 26.0–34.0)
MCHC: 33.3 g/dL (ref 32.0–36.0)
MCV: 94 fL (ref 80.0–100.0)
PLATELETS: 233 10*3/uL (ref 150–440)
RBC: 3.72 MIL/uL — AB (ref 3.80–5.20)
RDW: 13.4 % (ref 11.5–14.5)
WBC: 19.4 10*3/uL — AB (ref 3.6–11.0)

## 2016-01-12 LAB — C DIFFICILE QUICK SCREEN W PCR REFLEX
C DIFFICILE (CDIFF) TOXIN: POSITIVE — AB
C DIFFICLE (CDIFF) ANTIGEN: POSITIVE — AB
C Diff interpretation: POSITIVE

## 2016-01-12 LAB — SURGICAL PCR SCREEN
MRSA, PCR: NEGATIVE
STAPHYLOCOCCUS AUREUS: NEGATIVE

## 2016-01-12 MED ORDER — VANCOMYCIN 50 MG/ML ORAL SOLUTION
125.0000 mg | Freq: Four times a day (QID) | ORAL | Status: DC
  Administered 2016-01-12 – 2016-01-15 (×11): 125 mg via ORAL
  Filled 2016-01-12 (×19): qty 2.5

## 2016-01-12 MED ORDER — DIAZEPAM 5 MG/ML PO CONC: 5.0000 mg | Freq: Four times a day (QID) | ORAL | Status: DC | PRN

## 2016-01-12 MED ORDER — LEVETIRACETAM 500 MG/5ML IV SOLN
500.0000 mg | Freq: Two times a day (BID) | INTRAVENOUS | Status: DC
  Administered 2016-01-12 – 2016-01-13 (×3): 500 mg via INTRAVENOUS
  Filled 2016-01-12 (×5): qty 5

## 2016-01-12 MED ORDER — HEPARIN SODIUM (PORCINE) 5000 UNIT/ML IJ SOLN: 5000.0000 [IU] | Freq: Three times a day (TID) | INTRAMUSCULAR | Status: DC

## 2016-01-12 MED ORDER — LACTATED RINGERS IV SOLN
INTRAVENOUS | Status: DC
  Administered 2016-01-12 – 2016-01-13 (×2): via INTRAVENOUS

## 2016-01-12 MED ORDER — DIAZEPAM 5 MG/ML IJ SOLN
5.0000 mg | Freq: Four times a day (QID) | INTRAMUSCULAR | Status: DC | PRN
  Administered 2016-01-12 – 2016-01-13 (×4): 5 mg via INTRAVENOUS
  Filled 2016-01-12 (×5): qty 2

## 2016-01-12 MED ORDER — NOREPINEPHRINE 4 MG/250ML-% IV SOLN
0.0000 ug/min | INTRAVENOUS | Status: DC
  Administered 2016-01-11: 3 ug/min via INTRAVENOUS

## 2016-01-12 MED ORDER — FAMOTIDINE IN NACL 20-0.9 MG/50ML-% IV SOLN
20.0000 mg | INTRAVENOUS | Status: DC
  Administered 2016-01-13: 20 mg via INTRAVENOUS
  Filled 2016-01-12: qty 50

## 2016-01-12 MED ORDER — KETOROLAC TROMETHAMINE 15 MG/ML IJ SOLN
15.0000 mg | Freq: Once | INTRAMUSCULAR | Status: AC
  Administered 2016-01-12: 15 mg via INTRAVENOUS
  Filled 2016-01-12: qty 1

## 2016-01-12 MED ORDER — FAMOTIDINE IN NACL 20-0.9 MG/50ML-% IV SOLN
20.0000 mg | Freq: Two times a day (BID) | INTRAVENOUS | Status: DC
  Administered 2016-01-12 (×2): 20 mg via INTRAVENOUS
  Filled 2016-01-12 (×3): qty 50

## 2016-01-12 MED ORDER — METRONIDAZOLE IN NACL 5-0.79 MG/ML-% IV SOLN
500.0000 mg | Freq: Three times a day (TID) | INTRAVENOUS | Status: DC
  Administered 2016-01-12 – 2016-01-16 (×13): 500 mg via INTRAVENOUS
  Filled 2016-01-12 (×20): qty 100

## 2016-01-12 MED ORDER — BISACODYL 10 MG RE SUPP
10.0000 mg | Freq: Every day | RECTAL | Status: DC | PRN
  Administered 2016-01-12: 10 mg via RECTAL
  Filled 2016-01-12: qty 1

## 2016-01-12 MED ORDER — HEPARIN SODIUM (PORCINE) 5000 UNIT/ML IJ SOLN
5000.0000 [IU] | Freq: Three times a day (TID) | INTRAMUSCULAR | Status: DC
  Administered 2016-01-12 – 2016-01-15 (×10): 5000 [IU] via SUBCUTANEOUS
  Filled 2016-01-12 (×10): qty 1

## 2016-01-12 MED ORDER — PIPERACILLIN-TAZOBACTAM 3.375 G IVPB
3.3750 g | Freq: Three times a day (TID) | INTRAVENOUS | Status: DC
  Administered 2016-01-12 – 2016-01-13 (×3): 3.375 g via INTRAVENOUS
  Filled 2016-01-12 (×3): qty 50

## 2016-01-12 MED ORDER — MORPHINE SULFATE (PF) 2 MG/ML IV SOLN
1.0000 mg | INTRAVENOUS | Status: DC | PRN
  Administered 2016-01-12 (×3): 1 mg via INTRAVENOUS
  Administered 2016-01-13 – 2016-01-14 (×11): 2 mg via INTRAVENOUS
  Filled 2016-01-12 (×14): qty 1

## 2016-01-12 MED ORDER — DEXTROSE 5 % IV SOLN: 0.0000 ug/min | INTRAVENOUS | Status: DC

## 2016-01-12 NOTE — Progress Notes (Signed)
[facility] [place] at [facility]   PATIENT NAME: Jennifer [name]    MR#:  [id number]  DATE OF BIRTH:  06-09-1961 Admitted for lethargy, found to have UTI, C. difficile colitis. Admitted to ICU because of hypotension. Started on Levophed, IV antibiotics. Patient came off the Levophed since this morning. Blood pressure is holding. Is a rectal tube having loose stools in there. Aspirin is at the beds Chief Complaint  Patient presents with  . Urinary Tract Infection  . Code Sepsis    REVIEW OF SYSTEMS:   Review of Systems  Unable to perform ROS: critical illness     DRUG ALLERGIES:   Allergies  Allergen Reactions  . Bee Venom Anaphylaxis  . Hydrocodone Other (See Comments)    Reaction:  Muscle stiffness   . Lipitor [Atorvastatin] Other (See Comments)    Reaction:  Muscle cramps   . Shellfish Allergy Anaphylaxis  . Oxycodone Other (See Comments)    Reaction:  Muscle stiffness   . Prednisone Other (See Comments)    Reaction:  Psychosis   . Vicodin [Hydrocodone-Acetaminophen] Other (See Comments)    Reaction:  Muscle stiffness   . Acyclovir And Related   . Oxycontin [Oxycodone Hcl] Other (See Comments)    Reaction:  Muscle stiffness   . Peanut-Containing Drug Products Diarrhea    VITALS:  Blood pressure 127/63, pulse 74, temperature 98.2 F (36.8 C), temperature source Axillary, resp. rate 25, height  (1.626 m), weight 37.3 kg (82 lb 3.7 oz), SpO2 96 %.  PHYSICAL EXAMINATION:  GENERAL:  55 y.o.-year-old patient lying in the bed with no acute distress. CACHECTIC,Patient is ill-looking.  EYES: Pupils equal, round, reactive to light and accommodation. No scleral icterus. Extraocular muscles intact.  HEENT: Head atraumatic, normocephalic. Oropharynx and nasopharynx clear.  NECK:  Supple, no jugular venous distention. No thyroid enlargement, no tenderness.  LUNGS: Normal breath sounds bilaterally, no wheezing, rales,rhonchi or crepitation. No  use of accessory muscles of respiration. DIMINISHED AT BASES, CARDIOVASCULAR: S1, S2 normal. No murmurs, rubs, or gallops.  ABDOMEN: Soft, nontender, nondistended. Bowel sounds present. No organomegaly or mass.  EXTREMITIES: No pedal edema, cyanosis, or clubbing.  Neurological: Awake, follows some basic commands, moans and groans. Psych awake and agitated.  SKIN: No obvious rash, lesion, or ulcer.    LABORATORY PANEL:   CBC  Recent Labs Lab 01/12/16 0424  WBC 19.4*  HGB 11.6*  HCT 34.9*  PLT 233   ------------------------------------------------------------------------------------------------------------------  Chemistries   Recent Labs Lab January 29, 2016 1909 01/12/16 0424  NA 131* 136  K 5.1 3.6  CL 103 113*  CO2 14* 13*  GLUCOSE 81 100*  BUN 51* 46*  CREATININE 2.71* 1.69*  CALCIUM 8.0* 7.6*  AST 39  --   ALT 43  --   ALKPHOS 58  --   BILITOT 1.0  --    ------------------------------------------------------------------------------------------------------------------  Cardiac Enzymes  Recent Labs Lab 2016-01-29 1909  TROPONINI 0.16*   ------------------------------------------------------------------------------------------------------------------  RADIOLOGY:  Dg Abd 1 View  01/12/2016  CLINICAL DATA:  Abdominal pain.  History of Celsius difficile. EXAM: ABDOMEN - 1 VIEW COMPARISON:  Multiple abdominal radiographs dating from 09/11/2015 through 12/07/2015. FINDINGS: Diffuse gaseous distention of small and large bowel. Large EU shaped bowel loop in the pelvis. Similar changes have been present on multiple prior studies. Appearance suggests diffuse adynamic ileus. Pelvic loop may indicate sigmoid volvulus. Stool-filled rectum with rectal tube in place. No radiopaque stones. Visualized bones appear intact. IMPRESSION: Diffuse gas-filled and distended  small and large bowel likely to represent ileus. Prominent gas-filled bowel loops in the pelvis could also represent  sigmoid volvulus. Stool filled rectum with rectal tube present. Similar findings have been present on prior comparison studies. Electronically Signed   By: Burman NievesWilliam  Stevens M.D.   On: 01/12/2016 00:43   Dg Chest Port 1 View  Dec 04, 2015  CLINICAL DATA:  Sepsis.  Hypotension. EXAM: PORTABLE CHEST 1 VIEW COMPARISON:  09/23/2015 FINDINGS: Lung volumes are low. Diffuse patchy opacity throughout the left hemithorax. Improved right basilar aeration from prior exam. Minimal right infrahilar atelectasis. No definite pleural effusion. Heart size and mediastinal contours are unchanged. No pneumothorax. Gaseous distention of bowel loops in the upper abdomen, similar to prior. Bones appear under mineralized. IMPRESSION: Patchy opacities throughout the left lung, pneumonia favored over asymmetric pulmonary edema but Electronically Signed   By: Rubye OaksMelanie  Ehinger M.D.   On: 0May 10, 2017 20:05    EKG:   Orders placed or performed during the hospital encounter of February 27, 2016  . EKG 12-Lead  . EKG 12-Lead    ASSESSMENT AND PLAN:    septic shock: Present on admission: Secondary to pneumonia, C. difficile colitis of UTI: Continue  povancomycin, Zosyn, aggressive hydration to keep map of 60 appreciate intensivist seeing the patient.WBC STILL UP;FOLLOW BLOOD ,URINEC ULTURES, #2 acute renal failure due to ATN from sepsis improving with IV hydration, monitor urine output. #3 history of amyotrophic lateral sclerosis: Has chronic Foley catheter. Husband is requesting chronic suppressive therapy to prevent recurrent UTIs: Include consult ID.   Umbarger for dysphagia: Speech therapy consult today. #5 GERD continue PPIs 6 mild hyperkalemia: Improved. Stop the potassium supplements.   CODE STATUS is DO NOT RESUSCITATE #7. elevated troponins likely due to sepsis: Check echocardiogram. All the records are reviewed and case discussed with Care Management/Social Workerr. Management plans discussed with the patient, family and they  are in agreement.  CODE STATUS:dnr  TOTAL TIME TAKING CARE OF THIS PATIENT: 35minutes. (cct)  POSSIBLE D/C IN 5-6 DAYS, DEPENDING ON CLINICAL CONDITION.   Katha HammingKONIDENA,Jair Lindblad M.D on 01/12/2016 at 11:46 AM  Between 7am to 6pm - Pager - [phone]  After 6pm go to [email or web address] - password [email or web address]  [facility]  Office  [phone]  CC: Primary care physician; Ruthe Mannanalia Aron, MD   Note: This dictation was prepared with Dragon dictation along with smaller phrase technology. Any transcriptional errors that result from this process are unintentional.

## 2016-01-12 NOTE — Consult Note (Signed)
Pharmacy Antibiotic Note  Jennifer [name] is a 55 y.o. female admitted on 01/13/2016 with sepsis.  Pharmacy has been consulted for zosyn dosing.  Plan: Renal function improving, will adjust antibiotics  Zosyn 3.375gm IV Q12H, [facility] CrCl > 5520ml/min  Patient also on IV metronidazole for c. Diff (NPO at this time)   Height: 5\' 4"  (162.6 cm) Weight: 82 lb 3.7 oz (37.3 kg) IBW/kg (Calculated) : 54.7  Temp (24hrs), Avg:97.4 F (36.3 C), Min:96.6 F (35.9 C), Max:98.2 F (36.8 C)   Recent Labs Lab 01/06/2016 1909 01/22/2016 2308 01/12/16 0424  WBC 11.9*  --  19.4*  CREATININE 2.71*  --  1.69*  LATICACIDVEN 1.9 2.0  --     Estimated Creatinine Clearance: 22.1 mL/min (by C-G formula based on Cr of 1.69).    Allergies  Allergen Reactions  . Bee Venom Anaphylaxis  . Hydrocodone Other (See Comments)    Reaction:  Muscle stiffness   . Lipitor [Atorvastatin] Other (See Comments)    Reaction:  Muscle cramps   . Shellfish Allergy Anaphylaxis  . Oxycodone Other (See Comments)    Reaction:  Muscle stiffness   . Prednisone Other (See Comments)    Reaction:  Psychosis   . Vicodin [Hydrocodone-Acetaminophen] Other (See Comments)    Reaction:  Muscle stiffness   . Oxycontin [Oxycodone Hcl] Other (See Comments)    Reaction:  Muscle stiffness   . Peanut-Containing Drug Products Diarrhea    Antimicrobials this admission: vancomycin 6/17 >> 6/18 zosyn 6/17 >> Metronidazole 6/18 >>   Dose adjustments this admission:   Microbiology results: 6/17 BCx: pending 6/17 UCx: pending  6/17: cdiff - positive  6/17: GI panel 6/17: Surgical MRSA PCR negative  Thank you for allowing pharmacy to be a part of this patient's care.  Miliana Gangwer C 01/12/2016 9:23 AM

## 2016-01-13 ENCOUNTER — Inpatient Hospital Stay: Payer: BLUE CROSS/BLUE SHIELD

## 2016-01-13 DIAGNOSIS — A0472 Enterocolitis due to Clostridium difficile, not specified as recurrent: Secondary | ICD-10-CM | POA: Insufficient documentation

## 2016-01-13 DIAGNOSIS — R6521 Severe sepsis with septic shock: Secondary | ICD-10-CM

## 2016-01-13 LAB — MAGNESIUM: MAGNESIUM: 1.9 mg/dL (ref 1.7–2.4)

## 2016-01-13 LAB — CBC
HCT: 35.7 % (ref 35.0–47.0)
HEMOGLOBIN: 12 g/dL (ref 12.0–16.0)
MCH: 31 pg (ref 26.0–34.0)
MCHC: 33.7 g/dL (ref 32.0–36.0)
MCV: 92.1 fL (ref 80.0–100.0)
PLATELETS: 242 10*3/uL (ref 150–440)
RBC: 3.88 MIL/uL (ref 3.80–5.20)
RDW: 13.3 % (ref 11.5–14.5)
WBC: 17.8 10*3/uL — ABNORMAL HIGH (ref 3.6–11.0)

## 2016-01-13 LAB — BASIC METABOLIC PANEL
Anion gap: 7 (ref 5–15)
BUN: 29 mg/dL — AB (ref 6–20)
CALCIUM: 7.9 mg/dL — AB (ref 8.9–10.3)
CO2: 15 mmol/L — AB (ref 22–32)
CREATININE: 0.79 mg/dL (ref 0.44–1.00)
Chloride: 117 mmol/L — ABNORMAL HIGH (ref 101–111)
GFR calc Af Amer: >60 mL/min (ref >60)
GFR calc non Af Amer: >60 mL/min (ref >60)
GLUCOSE: 77 mg/dL (ref 65–99)
Potassium: 3 mmol/L — ABNORMAL LOW (ref 3.5–5.1)
Sodium: 139 mmol/L (ref 135–145)

## 2016-01-13 MED ORDER — POTASSIUM CHLORIDE 20 MEQ/15ML (10%) PO SOLN
40.0000 meq | Freq: Once | ORAL | Status: AC
  Administered 2016-01-13: 40 meq via ORAL
  Filled 2016-01-13: qty 30

## 2016-01-13 MED ORDER — FAMOTIDINE 20 MG PO TABS
20.0000 mg | ORAL_TABLET | Freq: Every day | ORAL | Status: DC
  Administered 2016-01-14 – 2016-01-15 (×2): 20 mg via ORAL
  Filled 2016-01-13 (×2): qty 1

## 2016-01-13 MED ORDER — DIAZEPAM 5 MG PO TABS
5.0000 mg | ORAL_TABLET | ORAL | Status: DC | PRN
  Administered 2016-01-13 – 2016-01-14 (×7): 5 mg via ORAL
  Filled 2016-01-13 (×8): qty 1

## 2016-01-13 MED ORDER — LEVETIRACETAM 100 MG/ML PO SOLN
500.0000 mg | Freq: Two times a day (BID) | ORAL | Status: DC
  Administered 2016-01-13 – 2016-01-15 (×5): 500 mg via ORAL
  Filled 2016-01-13 (×7): qty 5

## 2016-01-13 MED ORDER — BUPROPION HCL 75 MG PO TABS
150.0000 mg | ORAL_TABLET | Freq: Four times a day (QID) | ORAL | Status: DC
  Administered 2016-01-13 – 2016-01-15 (×6): 150 mg via ORAL
  Filled 2016-01-13 (×9): qty 2

## 2016-01-13 NOTE — Consult Note (Signed)
Patient to be transferred out to gen med floor Will sign off at this time  Please call [phone] for further questions.    Lucie LeatherKurian David Elazar Argabright, M.D.  [facility]  Medical Director [facility] Medical Director [facility]

## 2016-01-13 NOTE — Care Management (Signed)
RNCM requested palliative care be consulted again for goals of care due to ES ALS. Positive for C Diff

## 2016-01-13 NOTE — Consult Note (Signed)
Jennifer Miniumarren [name], MD [facility]  [address] [place], [place] [address] Phone: [phone] Fax : [phone]  Consultation  Referring Provider:     No ref. provider found Primary Care Physician:  Jennifer Mannanalia Aron, MD Primary Gastroenterologist:           Reason for Consultation:     C. difficile colitis  Date of Admission:  2016-06-06 Date of Consultation:  01/13/2016         HPI:   Jennifer RumpleKatherine [name] is a 55 y.o. female who has a history of C. difficile colitis and was treated in the past. 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 The patient's white cell was elevated at 11.9 which went up to 19.4 and is now down to 17.8. The patient's hemoglobin is 12 today. The patient has been seen and evaluated by infectious disease. There are noted and recommendations were reviewed. The patient had been on Cipro for UTI and subsequent to that the patient was treated for C. difficile. Upon arrival to the emergency room the patient was hypotensive and she was admitted to the ICU. The patient was now transferred down to the floor today.  Past Medical History  Diagnosis Date  . ALS (amyotrophic lateral sclerosis) (HCC)   . Depression   . GERD (gastroesophageal reflux disease)   . Urinary retention   . Dysphagia, oropharyngeal 11/28/2013  . CN (constipation) 11/28/2013    Past Surgical History  Procedure Laterality Date  . Cesarean section    . Nasal sinus surgery      Prior to Admission medications   Medication Sig Start Date End Date Taking? Authorizing Provider  buPROPion (WELLBUTRIN SR) 150 MG 12 hr tablet Take 300 mg by mouth 2 (two) times daily.   Yes Historical Provider, MD  cholecalciferol (VITAMIN D) 1000 UNITS tablet Take 1,000 Units by mouth daily.   Yes Historical Provider, MD  ciprofloxacin (CIPRO) 500 MG tablet Take 1 tablet (500 mg total) by mouth 2 (two) times daily. 01/02/16  Yes Jennifer Munroeegina W Baity, NP  Dextromethorphan-Quinidine (NUEDEXTA) 20-10 MG CAPS Take  1 capsule by mouth 2 (two) times daily.    Yes Historical Provider, MD  diazepam (VALIUM) 10 MG tablet TAKE ONE TABLET BY MOUTH EVERY 6 HOURS AS NEEDED 12/24/15  Yes Dianne Dunalia M Aron, MD  ibuprofen (ADVIL,MOTRIN) 200 MG tablet Take 800 mg by mouth 3 (three) times daily as needed for mild pain.    Yes Historical Provider, MD  levETIRAcetam (KEPPRA) 500 MG tablet Take 500 mg by mouth 2 (two) times daily.    Yes Historical Provider, MD  potassium chloride 20 MEQ/15ML (10%) SOLN Take 20 mEq by mouth daily.   Yes Historical Provider, MD  ranitidine (ZANTAC) 150 MG tablet Take 150 mg by mouth daily as needed for heartburn.   Yes Historical Provider, MD  tizanidine (ZANAFLEX) 2 MG capsule Take 2 mg by mouth 3 (three) times daily.    Yes Historical Provider, MD    Family History  Problem Relation Age of Onset  . COPD Mother   . Thyroid disease Mother   . Diverticulosis Mother   . Heart attack Mother   . Gout Father   . Depression Sister   . Thyroid disease Sister   . Cancer Brother     kidney  . Heart attack Maternal Grandmother   . Heart attack Maternal Grandfather   . Heart attack Paternal Grandfather   . Hyperlipidemia Sister   . Evelene CroonWolff  Parkinson White syndrome Sister 74  . Depression Brother   . Hypertension Brother      Social History  Substance Use Topics  . Smoking status: Former Smoker    Quit date: 07/28/2007  . Smokeless tobacco: Never Used  . Alcohol Use: No    Allergies as of 02/03/16 - Review Complete Feb 03, 2016  Allergen Reaction Noted  . Bee venom Anaphylaxis 09/15/2014  . Hydrocodone Other (See Comments) 01/18/2015  . Lipitor [atorvastatin] Other (See Comments) 08/21/2013  . Shellfish allergy Anaphylaxis 09/15/2014  . Oxycodone Other (See Comments) 10/24/2013  . Prednisone Other (See Comments) 09/11/2013  . Vicodin [hydrocodone-acetaminophen] Other (See Comments) 10/24/2013  . Oxycontin [oxycodone hcl] Other (See Comments) 12/31/2014  . Peanut-containing drug  products Diarrhea 09/15/2014    Review of Systems:    All systems reviewed and negative except where noted in HPI.   Physical Exam:  Vital signs in last 24 hours: Temp:  [97.9 F (36.6 C)-99.5 F (37.5 C)] 99.5 F (37.5 C) (06/19 1832) Pulse Rate:  [73-91] 91 (06/19 1832) Resp:  [13-20] 20 (06/19 1832) BP: (100-140)/(64-92) 102/64 mmHg (06/19 1832) SpO2:  [92 %-98 %] 94 % (06/19 1832) Last BM Date: 01/13/16 General:  Lying in bed sleeping, the patient was recently given pain medication and is resting comfortably. Skin:  Intact without significant lesions or rashes.    LAB RESULTS:  Recent Labs  2016/02/03 1909 01/12/16 0424 01/13/16 0314  WBC 11.9* 19.4* 17.8*  HGB 11.3* 11.6* 12.0  HCT 33.9* 34.9* 35.7  PLT 183 233 242   BMET  Recent Labs  02-03-2016 1909 01/12/16 0424 01/13/16 0314  NA 131* 136 139  K 5.1 3.6 3.0*  CL 103 113* 117*  CO2 14* 13* 15*  GLUCOSE 81 100* 77  BUN 51* 46* 29*  CREATININE 2.71* 1.69* 0.79  CALCIUM 8.0* 7.6* 7.9*   LFT  Recent Labs  Feb 03, 2016 1909  PROT 5.1*  ALBUMIN 2.5*  AST 39  ALT 43  ALKPHOS 58  BILITOT 1.0   PT/INR  Recent Labs  2016/02/03 1909  LABPROT 15.0  INR 1.16    STUDIES: Dg Abd 1 View  01/12/2016  CLINICAL DATA:  Abdominal pain.  History of Celsius difficile. EXAM: ABDOMEN - 1 VIEW COMPARISON:  Multiple abdominal radiographs dating from 09/11/2015 through 12/07/2015. FINDINGS: Diffuse gaseous distention of small and large bowel. Large EU shaped bowel loop in the pelvis. Similar changes have been present on multiple prior studies. Appearance suggests diffuse adynamic ileus. Pelvic loop may indicate sigmoid volvulus. Stool-filled rectum with rectal tube in place. No radiopaque stones. Visualized bones appear intact. IMPRESSION: Diffuse gas-filled and distended small and large bowel likely to represent ileus. Prominent gas-filled bowel loops in the pelvis could also represent sigmoid volvulus. Stool filled rectum  with rectal tube present. Similar findings have been present on prior comparison studies. Electronically Signed   By: Burman Nieves M.D.   On: 01/12/2016 00:43   Dg Chest Port 1 View  01/13/2016  CLINICAL DATA:  Respiratory failure. EXAM: PORTABLE CHEST 1 VIEW COMPARISON:  Chest x-ray 01/13/2016 February 03, 2016. Abdominal series 618 2017. FINDINGS: Mediastinum hilar structures are normal. Low lung volumes with bibasilar atelectasis and or infiltrates. Interim near complete clearing of left upper lobe infiltrate. Heart size stable. Small left pleural effusion. Progressive bowel distention. Abdominal series suggested for further evaluation. Interposition of the colon under the right hemidiaphragm noted. No acute osseous abnormality. IMPRESSION: 1. Low lung volumes with bibasilar atelectasis and/or infiltrates again noted. Interim  near complete clearing of left upper lobe infiltrate. 2. Progressive bowel distention. Abdominal series suggested for further evaluation. Electronically Signed   By: Maisie Fus  Register   On: 01/13/2016 07:24   Dg Chest Port 1 View  01/16/2016  CLINICAL DATA:  Sepsis.  Hypotension. EXAM: PORTABLE CHEST 1 VIEW COMPARISON:  09/23/2015 FINDINGS: Lung volumes are low. Diffuse patchy opacity throughout the left hemithorax. Improved right basilar aeration from prior exam. Minimal right infrahilar atelectasis. No definite pleural effusion. Heart size and mediastinal contours are unchanged. No pneumothorax. Gaseous distention of bowel loops in the upper abdomen, similar to prior. Bones appear under mineralized. IMPRESSION: Patchy opacities throughout the left lung, pneumonia favored over asymmetric pulmonary edema but Electronically Signed   By: Rubye Oaks M.D.   On: 01/21/2016 20:05      Impression / Plan:   Jennifer [name] is a 55 y.o. y/o female with a history of ALS who came in with C. difficile colitis and sepsis. The patient is resting comfortably and is on vancomycin and Flagyl. I  agree with infectious disease recommendations. Nothing further to add to that except that the patient may need a stool transplant if she does not improve on the present medications. If this is the case Dr. Mechele Collin will need to be involved in the case.  Thank you for involving me in the care of this patient.      LOS: 2 days   Jennifer Minium, MD  01/13/2016, 6:47 PM   Note: This dictation was prepared with Dragon dictation along with smaller phrase technology. Any transcriptional errors that result from this process are unintentional.

## 2016-01-13 NOTE — Care Management (Signed)
Active with [facility]  For SN, PT, OT and HHA. Barbara CowerJason with [facility] aware.

## 2016-01-13 NOTE — Progress Notes (Signed)
[facility] [place] at [facility]   PATIENT NAME: Jennifer [name]    MR#:  [id number]  DATE OF BIRTH:  07-19-1961  is at the beds Chief Complaint  Patient presents with  . Urinary Tract Infection  . Code Sepsis  Patient doesn't communicate much but doing better blood pressure stable Diarrhea persist positive for C. difficile    REVIEW OF SYSTEMS:   Review of Systems  Unable to perform Due to chronic base line    DRUG ALLERGIES:   Allergies  Allergen Reactions  . Bee Venom Anaphylaxis  . Hydrocodone Other (See Comments)    Reaction:  Muscle stiffness   . Lipitor [Atorvastatin] Other (See Comments)    Reaction:  Muscle cramps   . Shellfish Allergy Anaphylaxis  . Oxycodone Other (See Comments)    Reaction:  Muscle stiffness   . Prednisone Other (See Comments)    Reaction:  Psychosis   . Vicodin [Hydrocodone-Acetaminophen] Other (See Comments)    Reaction:  Muscle stiffness   . Acyclovir And Related   . Oxycontin [Oxycodone Hcl] Other (See Comments)    Reaction:  Muscle stiffness   . Peanut-Containing Drug Products Diarrhea    VITALS:  Blood pressure 132/91, pulse 90, temperature 98.5 F (36.9 C), temperature source Axillary, resp. rate 14, height 5\' 4"  (1.626 m), weight 37.3 kg (82 lb 3.7 oz), SpO2 94 %.  PHYSICAL EXAMINATION:  GENERAL:  55 y.o.-year-old patient lying in the bed with no acute distress. CACHECTIC,Patient is ill-looking.  EYES: Pupils equal, round, reactive to light and accommodation. No scleral icterus. Extraocular muscles intact.  HEENT: Head atraumatic, normocephalic. Oropharynx and nasopharynx clear.  NECK:  Supple, no jugular venous distention. No thyroid enlargement, no tenderness.  LUNGS: Normal breath sounds bilaterally, no wheezing, rales,rhonchi or crepitation. No use of accessory muscles of respiration. DIMINISHED AT BASES, CARDIOVASCULAR: S1, S2 normal. No murmurs, rubs, or gallops.  ABDOMEN: Soft, nontender,  nondistended. Bowel sounds present. No organomegaly or mass.  EXTREMITIES: No pedal edema, cyanosis, or clubbing.  Neurological: Awake, follows some basic commands,  Psych awake   SKIN: No obvious rash, lesion, or ulcer.    LABORATORY PANEL:   CBC  Recent Labs Lab 01/13/16 0314  WBC 17.8*  HGB 12.0  HCT 35.7  PLT 242   ------------------------------------------------------------------------------------------------------------------  Chemistries   Recent Labs Lab 2015-08-10 1909  01/13/16 0314  NA 131*  < > 139  K 5.1  < > 3.0*  CL 103  < > 117*  CO2 14*  < > 15*  GLUCOSE 81  < > 77  BUN 51*  < > 29*  CREATININE 2.71*  < > 0.79  CALCIUM 8.0*  < > 7.9*  MG  --   --  1.9  AST 39  --   --   ALT 43  --   --   ALKPHOS 58  --   --   BILITOT 1.0  --   --   < > = values in this interval not displayed. ------------------------------------------------------------------------------------------------------------------  Cardiac Enzymes  Recent Labs Lab 2015-08-10 1909  TROPONINI 0.16*   ------------------------------------------------------------------------------------------------------------------  RADIOLOGY:  Dg Abd 1 View  01/12/2016  CLINICAL DATA:  Abdominal pain.  History of Celsius difficile. EXAM: ABDOMEN - 1 VIEW COMPARISON:  Multiple abdominal radiographs dating from 09/11/2015 through 12/07/2015. FINDINGS: Diffuse gaseous distention of small and large bowel. Large EU shaped bowel loop in the pelvis. Similar changes have been present on multiple prior studies. Appearance suggests diffuse  adynamic ileus. Pelvic loop may indicate sigmoid volvulus. Stool-filled rectum with rectal tube in place. No radiopaque stones. Visualized bones appear intact. IMPRESSION: Diffuse gas-filled and distended small and large bowel likely to represent ileus. Prominent gas-filled bowel loops in the pelvis could also represent sigmoid volvulus. Stool filled rectum with rectal tube present.  Similar findings have been present on prior comparison studies. Electronically Signed   By: Burman Nieves M.D.   On: 01/12/2016 00:43   Dg Chest Port 1 View  01/13/2016  CLINICAL DATA:  Respiratory failure. EXAM: PORTABLE CHEST 1 VIEW COMPARISON:  Chest x-ray 01/13/2016 02/08/16. Abdominal series 618 2017. FINDINGS: Mediastinum hilar structures are normal. Low lung volumes with bibasilar atelectasis and or infiltrates. Interim near complete clearing of left upper lobe infiltrate. Heart size stable. Small left pleural effusion. Progressive bowel distention. Abdominal series suggested for further evaluation. Interposition of the colon under the right hemidiaphragm noted. No acute osseous abnormality. IMPRESSION: 1. Low lung volumes with bibasilar atelectasis and/or infiltrates again noted. Interim near complete clearing of left upper lobe infiltrate. 2. Progressive bowel distention. Abdominal series suggested for further evaluation. Electronically Signed   By: Maisie Fus  Register   On: 01/13/2016 07:24   Dg Chest Port 1 View  2016/02/08  CLINICAL DATA:  Sepsis.  Hypotension. EXAM: PORTABLE CHEST 1 VIEW COMPARISON:  09/23/2015 FINDINGS: Lung volumes are low. Diffuse patchy opacity throughout the left hemithorax. Improved right basilar aeration from prior exam. Minimal right infrahilar atelectasis. No definite pleural effusion. Heart size and mediastinal contours are unchanged. No pneumothorax. Gaseous distention of bowel loops in the upper abdomen, similar to prior. Bones appear under mineralized. IMPRESSION: Patchy opacities throughout the left lung, pneumonia favored over asymmetric pulmonary edema but Electronically Signed   By: Rubye Oaks M.D.   On: 08-Feb-2016 20:05    EKG:   Orders placed or performed during the hospital encounter of Feb 08, 2016  . EKG 12-Lead  . EKG 12-Lead    ASSESSMENT AND PLAN:  Patient's a 55 year old with diagnosis of ALS # 1. septic shock: Present on admission: She is  due to combination of recurrent C. difficile as well as UTI at this point continue therapy with vancomycin and Flagyl and await ID input into on cultures growing gram-positive bacteria #2 acute renal failure due to ATN from sepsis is always IV hydration  #3 history of amyotrophic lateral sclerosis: Has chronic Foley catheter. Prognosis poor  #5 GERD continue PPIs #6 mild hyperkalemia: We'll give her dose of potassium #7. elevated troponins likely due to sepsis:   All the records are reviewed and case discussed with Care Management/Social Workerr. Management plans discussed with the patient, family and they are in agreement.  CODE STATUS:dnr  TOTAL TIME TAKING CARE OF THIS PATIENT:  POSSIBLE D/C IN 5-6 DAYS, DEPENDING ON CLINICAL CONDITION.   Auburn Bilberry M.D on 01/13/2016 at 2:05 PM  Between 7am to 6pm - Pager - [phone]  After 6pm go to [email or web address] - password [email or web address]  [facility]  Office  [phone]  CC: Primary care physician; Ruthe Mannan, MD   Note: This dictation was prepared with Dragon dictation along with smaller phrase technology. Any transcriptional errors that result from this process are unintentional.

## 2016-01-13 NOTE — Evaluation (Signed)
Clinical/Bedside Swallow Evaluation Patient Details  Name: Jennifer [name] MRN: [id number] Date of Birth: [birth year]  Today's Date: 01/13/2016 Time: SLP Start Time (ACUTE ONLY): 0915 SLP Stop Time (ACUTE ONLY): 1015 SLP Time Calculation (min) (ACUTE ONLY): 60 min  Past Medical History:  Past Medical History  Diagnosis Date  . ALS (amyotrophic lateral sclerosis) (HCC)   . Depression   . GERD (gastroesophageal reflux disease)   . Urinary retention   . Dysphagia, oropharyngeal 11/28/2013  . CN (constipation) 11/28/2013   Past Surgical History:  Past Surgical History  Procedure Laterality Date  . Cesarean section    . Nasal sinus surgery     HPI:  Pt is a 55 year old Caucasian female with a history of amyotrophic lateral sclerosis (ALS), depression, GERD, dysphagia, and frequent UTIs who presents with fever, altered mental status and hypotension. Patient's husband states that it all started on Thursday when she became less responsive and was found to have a UTI. She was started on ciprofloxacin by her PCP and on 02/09/2016 she became more weak and less arousable hence she was brought to the ED for evaluation. At the ED, she was given IV fluids, but she remained hypotensive hence PCCM was consulted for further management. Pt has had recent admission/visits to the ED w/ txs to include UTIs. During admission in 08/2015, pt was seen by ST services who noted pt does have psychiatric issues and generally gets agitated due to those. She had been admitted for UTI with sepsis and acute encephalopathy then. During that admission, pt was orally intubated for ~1 week d/t declined respiratory status; MD was concerned for aspiration. MBSS on 09/06/15 revealed concern for high risk of prandial aspiration sec. to moderate-severe oropharyngeal phase dysphagia as well as risk for aspiration of Reflux d/t moderate-severe Esophageal phase deficits per the MBSS. Due to pt's declined medical status and weakness overall from  extended illness, pt presented w/ minimally improved oropharyngeal swallow function from previous swallow study but continued to present w/ Moderate pharyngeal phase dysphagia and Esophageal phase dysmotility/dysphagia w/ potential impact on the timing of the Oral phase during A-P transfer and swallow. Pt's delayed pharyngeal swallow initiation greatly increases risk for aspiration; deep laryngeal penetration was noted w/ TSP presentation of thin liquids x1 during this study. Pt exhibited best oropharyngeal control for timing of transfer and swallow initiation w/ TSP trials of Nectar liquids and purees. However, d/t pt's increased Respiratory effort/rate w/ any exertion, as well as pt's Esophageal phase dysmotility/dysphagia, pt is at increased risk for both prandial aspiration and aspiration of Reflux material. Due to pt's end stage ALS and decline status/weakness w/ increased pain requiring pain medications, pt is recommended to be on a Dysphagia 1(puree) w/ Nectar liquids to reduce risk for aspiration and compromised pulmonary status. Unsure of pt's overall oral intake but suspect pt is unable to meet her nutrition/hydration needs orally, safely.    Assessment / Plan / Recommendation Clinical Impression  Pt appeared to tolerate the recommended modified dysphagia diet of Dys. 1 w/ Nectar liquids via TSP (as rec'd per results of MBSS). Unable to meet w/ husband so far today, so education and information was reviewed w/ pt and NSG. Pt gave few verbal response attempts but agreed she liked ice cream. Pt required max assist w/ feeding d/t positioning and overall weakness; time b/t trials to allow for oral clearing b/t trials; noted slow oral phase bolus management w/ all trials. Pt appears at increased risk for aspiration d/t degree of oropharyngeal phase  dysphagia sec. to endstage ALS. Recommend definite need for a dysphagia diet consistency (dys. 1 - puree) w/ Nectar consistency liquids to pt/NSG/MD; education  on aspiration and Reflux/Esophageal dysmotility given as well w/ strategies. HH has been following pt and giving support w/ the recommended dysphagia diet since last admission/MBSS including the thickened liquids/ordering supplies; a Palliative Care consult has been ordered this admission. Pt appears at her baseline (if not somewhat more declined overall per MD notes); NSG to reconsult if any further need for dysphagia education for pt/staff. Precautions posted in chart.     Aspiration Risk  Moderate aspiration risk (-severe risk for aspiration)    Diet Recommendation  Dysphagia 1(puree), Nectar consistency liquids via TSP to control the amounts(small sip size); strict aspiration precautions; strict Reflux precautions  Medication Administration: Crushed with puree    Other  Recommendations Recommended Consults:  (Palliative Care consult; Dietician following) Oral Care Recommendations: Oral care BID;Staff/trained caregiver to provide oral care Other Recommendations: Order thickener from pharmacy;Prohibited food (jello, ice cream, thin soups);Remove water pitcher;Have oral suction available   Follow up Recommendations  None (TBD if any further need for education )    Frequency and Duration            Prognosis Prognosis for Safe Diet Advancement: Guarded Barriers to Reach Goals: Severity of deficits      Swallow Study   General Date of Onset: Aug 26, 2015 HPI: Pt is a 55 year old Caucasian female with a history of amyotrophic lateral sclerosis (ALS), depression, GERD, dysphagia, and frequent UTIs who presents with fever, altered mental status and hypotension. Patient's husband states that it all started on Thursday when she became less responsive and was found to have a UTI. She was started on ciprofloxacin by her PCP and on 08-10-2015 she became more weak and less arousable hence she was brought to the ED for evaluation. At the ED, she was given IV fluids, but she remained hypotensive hence  PCCM was consulted for further management. Pt has had recent admission/visits to the ED w/ txs to include UTIs. During admission in 08/2015, pt was seen by ST services who noted pt does have psychiatric issues and generally gets agitated due to those. She had been admitted for UTI with sepsis and acute encephalopathy then. During that admission, pt was orally intubated for ~1 week d/t declined respiratory status; MD was concerned for aspiration. MBSS on 09/06/15 revealed concern for high risk of prandial aspiration sec. to moderate-severe oropharyngeal phase dysphagia as well as risk for aspiration of Reflux d/t moderate-severe Esophageal phase deficits per the MBSS. Due to pt's declined medical status and weakness overall from extended illness, pt presented w/ minimally improved oropharyngeal swallow function from previous swallow study but continued to present w/ Moderate pharyngeal phase dysphagia and Esophageal phase dysmotility/dysphagia w/ potential impact on the timing of the Oral phase during A-P transfer and swallow. Pt's delayed pharyngeal swallow initiation greatly increases risk for aspiration; deep laryngeal penetration was noted w/ TSP presentation of thin liquids x1 during this study. Pt exhibited best oropharyngeal control for timing of transfer and swallow initiation w/ TSP trials of Nectar liquids and purees. However, d/t pt's increased Respiratory effort/rate w/ any exertion, as well as pt's Esophageal phase dysmotility/dysphagia, pt is at increased risk for both prandial aspiration and aspiration of Reflux material. Due to pt's end stage ALS and decline status/weakness w/ increased pain requiring pain medications, pt is recommended to be on a Dysphagia 1(puree) w/ Nectar liquids to reduce risk for aspiration  and compromised pulmonary status. Unsure of pt's overall oral intake but suspect pt is unable to meet her nutrition/hydration needs orally, safely.  Type of Study: Bedside Swallow  Evaluation Previous Swallow Assessment: BSE; MBSS previous admission Diet Prior to this Study: Dysphagia 1 (puree);Nectar-thick liquids Temperature Spikes Noted: No (wbc elevated) Respiratory Status: Room air History of Recent Intubation: No Behavior/Cognition: Cooperative;Requires cueing (awake, uncomfortable) Oral Cavity Assessment: Dry Oral Care Completed by SLP: Recent completion by staff Oral Cavity - Dentition: Adequate natural dentition;Missing dentition Vision:  (n/a) Self-Feeding Abilities: Total assist Patient Positioning: Postural control adequate for testing Baseline Vocal Quality: Low vocal intensity (dysarthric speech) Volitional Cough: Cognitively unable to elicit Volitional Swallow: Unable to elicit    Oral/Motor/Sensory Function Overall Oral Motor/Sensory Function: Moderate impairment (baseline for pt)   Ice Chips Ice chips: Not tested   Thin Liquid Thin Liquid: Not tested    Nectar Thick Nectar Thick Liquid: Impaired Presentation: Spoon (fed; 10 trials) Oral Phase Impairments: Reduced labial seal;Reduced lingual movement/coordination Oral phase functional implications: Prolonged oral transit;Left anterior spillage (leaning to that side) Pharyngeal Phase Impairments: Suspected delayed Swallow;Decreased hyoid-laryngeal movement (no overt coughing) Other Comments: no decline in O2 sats or respiratory status during po trials   Honey Thick Honey Thick Liquid: Not tested   Puree Puree: Impaired Presentation: Spoon (fed; 8 trials) Oral Phase Impairments: Reduced labial seal;Reduced lingual movement/coordination Oral Phase Functional Implications: Prolonged oral transit Pharyngeal Phase Impairments: Suspected delayed Swallow;Decreased hyoid-laryngeal movement (same as w/ Nectar trials)   Solid   GO   Solid: Not tested       Jerilynn Som, MS, CCC-SLP  [name],Dinisha 01/13/2016,2:35 PM

## 2016-01-13 NOTE — Progress Notes (Signed)
[facility] Husband at bedside and aware of transfer.

## 2016-01-13 NOTE — Progress Notes (Signed)
Initial Nutrition Assessment  DOCUMENTATION CODES:   Severe malnutrition in context of chronic illness, Underweight  INTERVENTION:  -Feeding assistance and encouragement at all meal times and in between meals -Magic Cup on all meal trays and as needed for med pass, etc. Will also send Mighty Shakes TID on meal trays -Noted feeding tube was discussed with pt's husband on last admission and did not want at this time, per Maralyn SagoSarah RN, continue to not want feeding tube. Will encourage po intake as tolerated by pt -Recommend addition of MVI as pt with poor intake and increased needs with stage III pressure ulcer   NUTRITION DIAGNOSIS:   Malnutrition related to dysphagia, chronic illness as evidenced by severe depletion of body fat, percent weight loss.  GOAL:   Patient will meet greater than or equal to 90% of their needs  MONITOR:   PO intake, Supplement acceptance, Weight trends, Labs  REASON FOR ASSESSMENT:   Malnutrition Screening Tool    ASSESSMENT:   55 yo female admitted with hypotension, lethargy with septic shock, positive for UTI and C.diff colitis. Pt with hx of ALS, dysphagia at baseline.    Palliative care consult pending; pt is DNR. Pt very tearful on visit this AM, in pain, personal sitter and Gafferarah RN at bedside. Personal sitter reports pt with chronic pain, takes scheduled pain meds around the clock at thome  Pt evaluated by SLP this AM and placed on a Dysphagia I with Nectar Thick Liquids. Pt personal sitter at bedside and reports that pt eats regular food with thin liquids at home normally but has been unable to swallow anything recently and has not eaten or had anything to drink since Thursday (6/15). Pt normally likes to eat english muffins with peanut butter, coffee and ice cream. Personal sitter reports that pt typically has a good appetite.  Nutrition-Focused physical exam completed. Limited exam as pt in pain and tearful on visit today but findings are severe  fat depletion, severe muscle depletion, and no edema.    Past Medical History  Diagnosis Date  . ALS (amyotrophic lateral sclerosis) (HCC)   . Depression   . GERD (gastroesophageal reflux disease)   . Urinary retention   . Dysphagia, oropharyngeal 11/28/2013  . CN (constipation) 11/28/2013     Diet Order:  DIET - DYS 1 Room service appropriate?: Yes with Assist; Fluid consistency:: Nectar Thick   Energy Intake: diet just advanced this AM, no meal intake yet; pt getting ready to take meds in magic cup on visit this AM  Skin:   (stage III pressure ulcer on sacrum)  Last BM:  6/19 C.diff positive   Labs: potassium 3.0 (supplemented)  Meds: LR at 75 ml/hr, potassium chloride  Height:   Ht Readings from Last 1 Encounters:  02-14-16 5\' 4"  (1.626 m)    Weight: weight trending down; 12% wt loss since December 2016/February 2017 when pt weighed over 100 pounds (93 pounds at the lowest)   Wt Readings from Last 1 Encounters:  02-14-16 82 lb 3.7 oz (37.3 kg)    BMI:  Body mass index is 14.11 kg/(m^2).  Estimated Nutritional Needs:   Kcal:  1500-1700 kcals   Protein:  65-85 g  Fluid:  >/= 1.5 L  EDUCATION NEEDS:   No education needs identified at this time  Romelle StarcherCate Candido Flott MS, RD, LDN [phone] Pager  [phone] Weekend/On-Call Pager

## 2016-01-13 NOTE — Consult Note (Signed)
WOC wound consult note Reason for Consult:Stgae 3 pressure injury to right sacral area.  ALS with contractures to upper and lower extremities.  Wound type:Stage 3 pressure injury Pressure Ulcer POA: Yes Measurement:2 cm x 2 cm x 0.2 cm  Wound AVW:UJWJbed:pale pink and moist 10% slough in wound bed. Drainage (amount, consistency, odor) Minimal serosanguinous  No odor.  Periwound:Intact Dressing procedure/placement/frequency:Cleanse sacral wound with soap and water and pat dry.  Apply Allevyn silicone border foam dressing.  Change every 3 days and PRN soilage.  Will need a sizewise alternamte mattress replacement with low air loss feature when transferred to med surg floor.  Will not follow at this time.  Please re-consult if needed.  Maple HudsonKaren Danta Baumgardner RN BSN CWON Pager [phone]

## 2016-01-13 NOTE — Progress Notes (Signed)
PULMONARY / CRITICAL CARE MEDICINE   Name: Jennifer [name] MRN: [id number] DOB: 08/18/1960    ADMISSION DATE:  Jan 19, 2016   CONSULTATION DATE:  01-19-16  REFERRING MD:  Hospitalist  CHIEF COMPLAINT:  Hypotension  HISTORY OF PRESENT ILLNESS:   This is a 55 year old Caucasian female with a history of amyotrophic lateral sclerosis (ALS), depression, GERD, dysphagia, and frequent UTIs who presents with fever, altered mental status and hypotension. Patient's husband states that it all started on Thursday when she became less responsive and was found to have a UTI. She was started on ciprofloxacin by her PCP and on Jan 19, 2016 she became more weak and less arousable hence she was brought to the ED for evaluation. At the ED, she was given IV fluids, but she remained hypotensive hence PCCM was consulted for further management. Husband states that patient has also been having intermittent loose stools. She was recently treated for C. difficile with oral vancomycin. She continues to have loose stools and complaining of abdominal cramping.   SUBJECTIVE:  No acute issues overnight. Blood pressure improved. Urine output low; maintenance fluids increased.   Off vasopressors, husband at bedside updated, very poor prognosis with end stage ALS Patient is DNR/DNI   VITAL SIGNS: BP 106/80 mmHg  Pulse 81  Temp(Src) 97.9 F (36.6 C) (Axillary)  Resp 14  Ht  (1.626 m)  Wt 82 lb 3.7 oz (37.3 kg)  BMI 14.11 kg/m2  SpO2 94%     INTAKE / OUTPUT: I/O last 3 completed shifts: In: 2946 [I.V.:2006; Other:180; IV Piggyback:760] Out: 2210 [Urine:1410; Stool:800]  PHYSICAL EXAMINATION: General: Cachectic and acutely ill-looking, comfortable in bed Neuro: Awake, follows some basic commands HEENT: PERRLA, oral mucosa dry Cardiovascular: Apical pulse regular, S1, S2 audible, no murmur, regurg or gallop  Lungs: Normal work of breathing, bilateral airflow, breath sounds diminished in the bases Abdomen:   Mildly distended, hypoactive bowel sounds, diffuse pain on palpation Musculoskeletal: Multiple contractures in upper and lower extremities Extremities: +2 pulses, no edema Skin: Warm and dry  LABS:  BMET  Recent Labs Lab 01-19-2016 1909 01/12/16 0424 01/13/16 0314  NA 131* 136 139  K 5.1 3.6 3.0*  CL 103 113* 117*  CO2 14* 13* 15*  BUN 51* 46* 29*  CREATININE 2.71* 1.69* 0.79  GLUCOSE 81 100* 77    Electrolytes  Recent Labs Lab 01-19-16 1909 01/12/16 0424 01/13/16 0314  CALCIUM 8.0* 7.6* 7.9*    CBC  Recent Labs Lab 2016/01/19 1909 01/12/16 0424 01/13/16 0314  WBC 11.9* 19.4* 17.8*  HGB 11.3* 11.6* 12.0  HCT 33.9* 34.9* 35.7  PLT 183 233 242    Coag's  Recent Labs Lab 2016-01-19 1909  APTT 36  INR 1.16    Sepsis Markers  Recent Labs Lab 01/19/16 1909 Jan 19, 2016 2308  LATICACIDVEN 1.9 2.0    ABG No results for input(s): PHART, PCO2ART, PO2ART in the last 168 hours.  Liver Enzymes  Recent Labs Lab 19-Jan-2016 1909  AST 39  ALT 43  ALKPHOS 58  BILITOT 1.0  ALBUMIN 2.5*    Cardiac Enzymes  Recent Labs Lab Jan 19, 2016 1909  TROPONINI 0.16*    Glucose  Recent Labs Lab 19-Jan-2016 2250  GLUCAP 88    Imaging No results found.  STUDIES:  None  CULTURES: Urine culture Blood cultures 2 C. difficile toxin-positive  ANTIBIOTICS: Vancomycin IV 06/17 - 06/18 Flagyl IV 06/17 >>  Zosyn 06/17 >>  Vancomycin oral 06/17 >>   SIGNIFICANT EVENTS: Jan 19, 2016: ED with altered mental status,  fever and decreased oral intake, admitted with UTI, AKA, I urosepsis, septic shock  LINES/TUBES: Peripheral IVs  DISCUSSION: 55 year old frail Caucasian female presenting with urosepsis, septic shock, AKA secondary to volume depletion and recurrent C. difficile colitis  ASSESSMENT / PLAN:  PULMONARY A: No acute issues-currently on room air P:   -Supplemental oxygen if needed  CARDIOVASCULAR A:  Septic shock-Improved P:  -Fluids  decreased -Low-dose Levophed off; resume if MAP<60 if needed -Hemodynamics per ICU  RENAL A:   AKI secondary to volume depletion Frequent UTIs History of urinary retention-Foley P:   - Continue maintenance fluids -Trend creatinine -Monitor and replace electrolytes -Empiric antibiotics -Foley care per protocol  GASTROINTESTINAL A:   Recurrent C. difficile colitis Abdominal pain Dysphagia Possible ileus History of GERD P:   -Flagyl and vancomycin. -Contact precautions -Aspiration precautions and dysphagia diet -Pepcid twice a day -follow up speech therapy  eval  HEMATOLOGIC A:   Mild anemia without blood loss P:  -Trend CBC  INFECTIOUS A:   Urosepsis P:   -Continue broad-spectrum antibiotics. -Follow-up cultures  ENDOCRINE A:   No acute issues  P:   Monitor blood glucose with BMP  NEUROLOGIC A:   ALS-end-stage with significant debility Depression P:   -Resume all home meds -Valium when necessary for muscle spasms  Disposition and family update: husband updated Best Practice: Code Status: DNR/DNI Diet: NPO GI prophylaxis:  H2 blocker VTE prophylaxis:  SCD's / heparin.    [facility] ANP-BC Pulmonary and Critical Care Medicine [facility] Pager [phone] or [phone]  01/13/2016, 7:01 AM   STAFF NOTE: I, Dr. Lucie LeatherKurian David Masaki [facility]auer,  have personally reviewed patient's available data, including medical history, events of note, physical examination and test results as part of my evaluation. I have discussed with NP and other care providers such as pharmacist, RN and RRT.  In addition,  I personally evaluated patient and elicited key findings     A:grimaces in pain, off vasopressors  P: ok to transfer to gen med floor  -await speech therapy eval -will assess medications on rounds    The Rest per NP whose note is outlined above and that I agree with  I have personally reviewed/obtained a history, examined the patient,  evaluated Pertinent laboratory and Radio[employer]/imaging results, and  formulated the assessment and plan   The Patient requires high complexity decision making for assessment and support, frequent evaluation and titration of therapies, application of advanced monitoring technologies and extensive interpretation of multiple databases. This Critical care time does not reflrect procedure time or Overall,prognosis is guarded, prognosis is very poor.  DNR/DNI   Lucie LeatherKurian David Anapaula Severt, M.D.  [facility]  Medical Director [facility] Medical Director [facility]

## 2016-01-13 NOTE — Consult Note (Signed)
[facility] Infectious Disease     Reason for Consult:UTI, fever    Referring Physician: Claria Dice Date of Admission:  12/26/2015   Active Problems:   Sepsis [facility])   HPI: Jennifer [name] is a 55 y.o. female with ALS, chronic foley and recurrent UTIs admitted 6/17 with confusion, low BP and found to have possible UTI and also having diarrhea with C diff + .    She had similar admissions in past. In Feb I saw her as well as in Dec had similar admission with ucx mixed. Treated with ertapenem and daptomycin then dced on fosfomycin .  Also had UTI dx in Oct and treated with Fosfomycin.    On this  admission had wbc 11 , has been afebrile Her UA showed 6-30  WBC and ucx with > 100k GPC BCX neg, C diff +   Past Medical History  Diagnosis Date  . ALS (amyotrophic lateral sclerosis) ([place])   . Depression   . GERD (gastroesophageal reflux disease)   . Urinary retention   . Dysphagia, oropharyngeal 11/28/2013  . CN (constipation) 11/28/2013   Past Surgical History  Procedure Laterality Date  . Cesarean section    . Nasal sinus surgery     Social History  Substance Use Topics  . Smoking status: Former Smoker    Quit date: 07/28/2007  . Smokeless tobacco: Never Used  . Alcohol Use: No   Family History  Problem Relation Age of Onset  . COPD Mother   . Thyroid disease Mother   . Diverticulosis Mother   . Heart attack Mother   . Gout Father   . Depression Sister   . Thyroid disease Sister   . Cancer Brother     kidney  . Heart attack Maternal Grandmother   . Heart attack Maternal Grandfather   . Heart attack Paternal Grandfather   . Hyperlipidemia Sister   . Yves Dill Parkinson White syndrome Sister 25  . Depression Brother   . Hypertension Brother     Allergies:  Allergies  Allergen Reactions  . Bee Venom Anaphylaxis  . Hydrocodone Other (See Comments)    Reaction:  Muscle stiffness   . Lipitor [Atorvastatin] Other (See Comments)    Reaction:  Muscle cramps   .  Shellfish Allergy Anaphylaxis  . Oxycodone Other (See Comments)    Reaction:  Muscle stiffness   . Prednisone Other (See Comments)    Reaction:  Psychosis   . Vicodin [Hydrocodone-Acetaminophen] Other (See Comments)    Reaction:  Muscle stiffness   . Acyclovir And Related   . Oxycontin [Oxycodone Hcl] Other (See Comments)    Reaction:  Muscle stiffness   . Peanut-Containing Drug Products Diarrhea    Current antibiotics: Antibiotics Given (last 72 hours)    Date/Time Action Medication Dose Rate   01/12/16 0203 Given   metroNIDAZOLE (FLAGYL) IVPB 500 mg 500 mg 100 mL/hr   01/12/16 0207 Given   vancomycin (VANCOCIN) 50 mg/mL oral solution 125 mg 125 mg    01/12/16 0343 Given   piperacillin-tazobactam (ZOSYN) IVPB 3.375 g 3.375 g 12.5 mL/hr   01/12/16 0346 Given   vancomycin (VANCOCIN) 500 mg in sodium chloride 0.9 % 100 mL IVPB 500 mg 100 mL/hr   01/12/16 0538 Given   vancomycin (VANCOCIN) 50 mg/mL oral solution 125 mg 125 mg    01/12/16 0759 Given   metroNIDAZOLE (FLAGYL) IVPB 500 mg 500 mg 100 mL/hr   01/12/16 1448 Given   piperacillin-tazobactam (ZOSYN) IVPB 3.375 g 3.375  g 12.5 mL/hr   01/12/16 1718 Given   metroNIDAZOLE (FLAGYL) IVPB 500 mg 500 mg 100 mL/hr   01/12/16 2233 Given   piperacillin-tazobactam (ZOSYN) IVPB 3.375 g 3.375 g 12.5 mL/hr   01/13/16 0111 Given   metroNIDAZOLE (FLAGYL) IVPB 500 mg 500 mg 100 mL/hr   01/13/16 0526 Given   piperacillin-tazobactam (ZOSYN) IVPB 3.375 g 3.375 g 12.5 mL/hr   01/13/16 1122 Given  [had to request from pharmacy]   metroNIDAZOLE (FLAGYL) IVPB 500 mg 500 mg 100 mL/hr   01/13/16 1122 Given   vancomycin (VANCOCIN) 50 mg/mL oral solution 125 mg 125 mg       MEDICATIONS: . buPROPion  150 mg Oral QID  . Dextromethorphan-Quinidine  1 capsule Oral BID  . [START ON 01/14/2016] famotidine  20 mg Oral Daily  . heparin  5,000 Units Subcutaneous Q8H  . levETIRAcetam  500 mg Oral BID  . metronidazole  500 mg Intravenous Q8H  .  tiZANidine  2 mg Oral QHS  . vancomycin  125 mg Oral Q6H    Review of Systems - unable to obtain   OBJECTIVE: Temp:  [97.9 F (36.6 C)-98.5 F (36.9 C)] 98.5 F (36.9 C) (06/19 1200) Pulse Rate:  [73-90] 83 (06/19 1400) Resp:  [13-20] 15 (06/19 1400) BP: (95-132)/(64-91) 118/76 mmHg (06/19 1400) SpO2:  [92 %-98 %] 93 % (06/19 1400) GENERAL: curled on side. Thin, moaning in pain  EYES: Pupils equal, round, reactive to light and accommodation. No scleral icterus.  HEENT: Head atraumatic, normocephalic. No oropharyngeal erythema, moist oral dry NECK: Supple, no jugular venous distention. No thyroid enlargement, no tenderness.  LUNGS: Normal breath sounds bilaterally, no wheezing, rales, rhonchi. No use of accessory muscles of respiration.  CARDIOVASCULAR: S1, S2 normal.  ABDOMEN: Soft, nontender, nondistended. Bowel sounds present. No organomegaly or mass.  EXTREMITIES: No pedal edema, cyanosis, or clubbing. + NEUROLOGIC: Slumped to the right side. Does not follow commands. PSYCHIATRIC: The patient is drowsy. SKIN: No obvious rash, lesion, or ulcer. Foley in place with concentrated urine Rectal tube with loose brown stool   LABS: Results for orders placed or performed during the hospital encounter of 01/03/2016 (from the past 48 hour(s))  Lactic acid, plasma     Status: None   Collection Time: 01/02/2016  7:09 PM  Result Value Ref Range   Lactic Acid, Venous 1.9 0.5 - 2.0 mmol/L  Comprehensive metabolic panel     Status: Abnormal   Collection Time: 01/05/2016  7:09 PM  Result Value Ref Range   Sodium 131 (L) 135 - 145 mmol/L   Potassium 5.1 3.5 - 5.1 mmol/L    Comment: HEMOLYSIS AT THIS LEVEL MAY AFFECT RESULT   Chloride 103 101 - 111 mmol/L   CO2 14 (L) 22 - 32 mmol/L   Glucose, Bld 81 65 - 99 mg/dL   BUN 51 (H) 6 - 20 mg/dL   Creatinine, Ser 2.71 (H) 0.44 - 1.00 mg/dL   Calcium 8.0 (L) 8.9 - 10.3 mg/dL   Total Protein 5.1 (L) 6.5 - 8.1 g/dL   Albumin 2.5 (L) 3.5 - 5.0  g/dL   AST 39 15 - 41 U/L    Comment: HEMOLYSIS AT THIS LEVEL MAY AFFECT RESULT   ALT 43 14 - 54 U/L    Comment: HEMOLYSIS AT THIS LEVEL MAY AFFECT RESULT   Alkaline Phosphatase 58 38 - 126 U/L   Total Bilirubin 1.0 0.3 - 1.2 mg/dL    Comment: HEMOLYSIS AT THIS LEVEL MAY AFFECT  RESULT   GFR calc non Af Amer 19 (L) >60 mL/min   GFR calc Af Amer 22 (L) >60 mL/min    Comment: (NOTE) The eGFR has been calculated using the CKD EPI equation. This calculation has not been validated in all clinical situations. eGFR's persistently <60 mL/min signify possible Chronic Kidney Disease.    Anion gap 14 5 - 15  Lipase, blood     Status: None   Collection Time: 12/30/2015  7:09 PM  Result Value Ref Range   Lipase 16 11 - 51 U/L  Troponin I     Status: Abnormal   Collection Time: 01/15/2016  7:09 PM  Result Value Ref Range   Troponin I 0.16 (H) <0.031 ng/mL    Comment: READ BACK AND VERIFIED WITH DAWN [facility] RN AT 1945 01/15/2016 MSS.        PERSISTENTLY INCREASED TROPONIN VALUES IN THE RANGE OF 0.04-0.49 ng/mL CAN BE SEEN IN:       -UNSTABLE ANGINA       -CONGESTIVE HEART FAILURE       -MYOCARDITIS       -CHEST TRAUMA       -ARRYHTHMIAS       -LATE PRESENTING MYOCARDIAL INFARCTION       -COPD   CLINICAL FOLLOW-UP RECOMMENDED.   CBC WITH DIFFERENTIAL     Status: Abnormal   Collection Time: 01/03/2016  7:09 PM  Result Value Ref Range   WBC 11.9 (H) 3.6 - 11.0 K/uL   RBC 3.61 (L) 3.80 - 5.20 MIL/uL   Hemoglobin 11.3 (L) 12.0 - 16.0 g/dL   HCT 33.9 (L) 35.0 - 47.0 %   MCV 93.7 80.0 - 100.0 fL   MCH 31.3 26.0 - 34.0 pg   MCHC 33.4 32.0 - 36.0 g/dL   RDW 13.3 11.5 - 14.5 %   Platelets 183 150 - 440 K/uL   Neutrophils Relative % 92% %   Neutro Abs 10.9 (H) 1.4 - 6.5 K/uL   Lymphocytes Relative 5% %   Lymphs Abs 0.6 (L) 1.0 - 3.6 K/uL   Monocytes Relative 2% %   Monocytes Absolute 0.2 0.2 - 0.9 K/uL   Eosinophils Relative 1% %   Eosinophils Absolute 0.1 0 - 0.7 K/uL   Basophils Relative  0% %   Basophils Absolute 0.0 0 - 0.1 K/uL   Smear Review MORPHOLOGY UNREMARKABLE   APTT     Status: None   Collection Time: 01/02/2016  7:09 PM  Result Value Ref Range   aPTT 36 24 - 36 seconds  Protime-INR     Status: None   Collection Time: 01/03/2016  7:09 PM  Result Value Ref Range   Prothrombin Time 15.0 11.4 - 15.0 seconds   INR 1.16   Blood Culture (routine x 2)     Status: None (Preliminary result)   Collection Time: 01/22/2016  7:09 PM  Result Value Ref Range   Specimen Description BLOOD RIGHT FOREARM    Special Requests BOTTLES DRAWN AEROBIC AND ANAEROBIC 1 CC    Culture NO GROWTH < 24 HOURS    Report Status PENDING   Blood Culture (routine x 2)     Status: None (Preliminary result)   Collection Time: 01/13/2016  7:14 PM  Result Value Ref Range   Specimen Description BLOOD LEFT ARM    Special Requests BOTTLES DRAWN AEROBIC AND ANAEROBIC 1 CC    Culture NO GROWTH < 24 HOURS    Report Status PENDING   Urinalysis complete,  with microscopic ([place] only)     Status: Abnormal   Collection Time: 01/17/2016  7:20 PM  Result Value Ref Range   Color, Urine AMBER (A) YELLOW   APPearance HAZY (A) CLEAR   Glucose, UA NEGATIVE NEGATIVE mg/dL   Bilirubin Urine NEGATIVE NEGATIVE   Ketones, ur TRACE (A) NEGATIVE mg/dL   Specific Gravity, Urine 1.026 1.005 - 1.030   Hgb urine dipstick NEGATIVE NEGATIVE   pH 5.0 5.0 - 8.0   Protein, ur 30 (A) NEGATIVE mg/dL   Nitrite NEGATIVE NEGATIVE   Leukocytes, UA 2+ (A) NEGATIVE   RBC / HPF 6-30 0 - 5 RBC/hpf   WBC, UA 6-30 0 - 5 WBC/hpf   Bacteria, UA FEW (A) NONE SEEN   Squamous Epithelial / LPF 0-5 (A) NONE SEEN   Trans Epithel, UA <1    Mucous PRESENT    Hyaline Casts, UA PRESENT    Ca Oxalate Crys, UA PRESENT   Urine culture     Status: Abnormal (Preliminary result)   Collection Time: 12/28/2015  7:20 PM  Result Value Ref Range   Specimen Description URINE, RANDOM    Special Requests NONE    Culture >=100,000 COLONIES/mL GRAM POSITIVE COCCI  (A)    Report Status PENDING   Glucose, capillary     Status: None   Collection Time: 01/17/2016 10:50 PM  Result Value Ref Range   Glucose-Capillary 88 65 - 99 mg/dL  Lactic acid, plasma     Status: None   Collection Time: 12/31/2015 11:08 PM  Result Value Ref Range   Lactic Acid, Venous 2.0 0.5 - 2.0 mmol/L  C difficile quick scan w PCR reflex     Status: Abnormal   Collection Time: 01/13/2016 11:14 PM  Result Value Ref Range   C Diff antigen POSITIVE (A) NEGATIVE   C Diff toxin POSITIVE (A) NEGATIVE   C Diff interpretation      Positive for toxigenic C. difficile, active toxin production present.    Comment: CALLED BRITTONLEE RUSTCHESTER AT 0010 ON 01/12/16 RWW  Gastrointestinal Panel by PCR , Stool     Status: None   Collection Time: 01/10/2016 11:14 PM  Result Value Ref Range   Campylobacter species NOT DETECTED NOT DETECTED   Plesimonas shigelloides NOT DETECTED NOT DETECTED   Salmonella species NOT DETECTED NOT DETECTED   Yersinia enterocolitica NOT DETECTED NOT DETECTED   Vibrio species NOT DETECTED NOT DETECTED   Vibrio cholerae NOT DETECTED NOT DETECTED   Enteroaggregative E coli (EAEC) NOT DETECTED NOT DETECTED   Enteropathogenic E coli (EPEC) NOT DETECTED NOT DETECTED   Enterotoxigenic E coli (ETEC) NOT DETECTED NOT DETECTED   Shiga like toxin producing E coli (STEC) NOT DETECTED NOT DETECTED   E. coli O157 NOT DETECTED NOT DETECTED   Shigella/Enteroinvasive E coli (EIEC) NOT DETECTED NOT DETECTED   Cryptosporidium NOT DETECTED NOT DETECTED   Cyclospora cayetanensis NOT DETECTED NOT DETECTED   Entamoeba histolytica NOT DETECTED NOT DETECTED   Giardia lamblia NOT DETECTED NOT DETECTED   Adenovirus F40/41 NOT DETECTED NOT DETECTED   Astrovirus NOT DETECTED NOT DETECTED   Norovirus GI/GII NOT DETECTED NOT DETECTED   Rotavirus A NOT DETECTED NOT DETECTED   Sapovirus (I, II, IV, and V) NOT DETECTED NOT DETECTED  Surgical pcr screen     Status: None   Collection Time: 01/04/2016  11:27 PM  Result Value Ref Range   MRSA, PCR NEGATIVE NEGATIVE   Staphylococcus aureus NEGATIVE NEGATIVE    Comment:  The Xpert SA Assay (FDA approved for NASAL specimens in patients over 36 years of age), is one component of a comprehensive surveillance program.  Test performance has been validated by [facility] for patients greater than or equal to 26 year old. It is not intended to diagnose infection nor to guide or monitor treatment.   Basic metabolic panel     Status: Abnormal   Collection Time: 01/12/16  4:24 AM  Result Value Ref Range   Sodium 136 135 - 145 mmol/L   Potassium 3.6 3.5 - 5.1 mmol/L   Chloride 113 (H) 101 - 111 mmol/L   CO2 13 (L) 22 - 32 mmol/L   Glucose, Bld 100 (H) 65 - 99 mg/dL   BUN 46 (H) 6 - 20 mg/dL   Creatinine, Ser 1.69 (H) 0.44 - 1.00 mg/dL   Calcium 7.6 (L) 8.9 - 10.3 mg/dL   GFR calc non Af Amer 33 (L) >60 mL/min   GFR calc Af Amer 38 (L) >60 mL/min    Comment: (NOTE) The eGFR has been calculated using the CKD EPI equation. This calculation has not been validated in all clinical situations. eGFR's persistently <60 mL/min signify possible Chronic Kidney Disease.    Anion gap 10 5 - 15  CBC     Status: Abnormal   Collection Time: 01/12/16  4:24 AM  Result Value Ref Range   WBC 19.4 (H) 3.6 - 11.0 K/uL   RBC 3.72 (L) 3.80 - 5.20 MIL/uL   Hemoglobin 11.6 (L) 12.0 - 16.0 g/dL   HCT 34.9 (L) 35.0 - 47.0 %   MCV 94.0 80.0 - 100.0 fL   MCH 31.3 26.0 - 34.0 pg   MCHC 33.3 32.0 - 36.0 g/dL   RDW 13.4 11.5 - 14.5 %   Platelets 233 150 - 440 K/uL  Basic metabolic panel     Status: Abnormal   Collection Time: 01/13/16  3:14 AM  Result Value Ref Range   Sodium 139 135 - 145 mmol/L   Potassium 3.0 (L) 3.5 - 5.1 mmol/L   Chloride 117 (H) 101 - 111 mmol/L   CO2 15 (L) 22 - 32 mmol/L   Glucose, Bld 77 65 - 99 mg/dL   BUN 29 (H) 6 - 20 mg/dL   Creatinine, Ser 0.79 0.44 - 1.00 mg/dL   Calcium 7.9 (L) 8.9 - 10.3 mg/dL   GFR calc non Af  Amer >60 >60 mL/min   GFR calc Af Amer >60 >60 mL/min    Comment: (NOTE) The eGFR has been calculated using the CKD EPI equation. This calculation has not been validated in all clinical situations. eGFR's persistently <60 mL/min signify possible Chronic Kidney Disease.    Anion gap 7 5 - 15  CBC     Status: Abnormal   Collection Time: 01/13/16  3:14 AM  Result Value Ref Range   WBC 17.8 (H) 3.6 - 11.0 K/uL   RBC 3.88 3.80 - 5.20 MIL/uL   Hemoglobin 12.0 12.0 - 16.0 g/dL   HCT 35.7 35.0 - 47.0 %   MCV 92.1 80.0 - 100.0 fL   MCH 31.0 26.0 - 34.0 pg   MCHC 33.7 32.0 - 36.0 g/dL   RDW 13.3 11.5 - 14.5 %   Platelets 242 150 - 440 K/uL  Magnesium     Status: None   Collection Time: 01/13/16  3:14 AM  Result Value Ref Range   Magnesium 1.9 1.7 - 2.4 mg/dL   No components found for: ESR,  C REACTIVE PROTEIN MICRO: Recent Results (from the past 720 hour(s))  Blood Culture (routine x 2)     Status: None (Preliminary result)   Collection Time: 01/10/2016  7:09 PM  Result Value Ref Range Status   Specimen Description BLOOD RIGHT FOREARM  Final   Special Requests BOTTLES DRAWN AEROBIC AND ANAEROBIC 1 CC  Final   Culture NO GROWTH < 24 HOURS  Final   Report Status PENDING  Incomplete  Blood Culture (routine x 2)     Status: None (Preliminary result)   Collection Time: 01/06/2016  7:14 PM  Result Value Ref Range Status   Specimen Description BLOOD LEFT ARM  Final   Special Requests BOTTLES DRAWN AEROBIC AND ANAEROBIC 1 CC  Final   Culture NO GROWTH < 24 HOURS  Final   Report Status PENDING  Incomplete  Urine culture     Status: Abnormal (Preliminary result)   Collection Time: 01/19/2016  7:20 PM  Result Value Ref Range Status   Specimen Description URINE, RANDOM  Final   Special Requests NONE  Final   Culture >=100,000 COLONIES/mL GRAM POSITIVE COCCI (A)  Final   Report Status PENDING  Incomplete  C difficile quick scan w PCR reflex     Status: Abnormal   Collection Time: 01/18/2016 11:14  PM  Result Value Ref Range Status   C Diff antigen POSITIVE (A) NEGATIVE Final   C Diff toxin POSITIVE (A) NEGATIVE Final   C Diff interpretation   Final    Positive for toxigenic C. difficile, active toxin production present.    Comment: CALLED BRITTONLEE RUSTCHESTER AT 0010 ON 01/12/16 RWW  Gastrointestinal Panel by PCR , Stool     Status: None   Collection Time: 01/10/2016 11:14 PM  Result Value Ref Range Status   Campylobacter species NOT DETECTED NOT DETECTED Final   Plesimonas shigelloides NOT DETECTED NOT DETECTED Final   Salmonella species NOT DETECTED NOT DETECTED Final   Yersinia enterocolitica NOT DETECTED NOT DETECTED Final   Vibrio species NOT DETECTED NOT DETECTED Final   Vibrio cholerae NOT DETECTED NOT DETECTED Final   Enteroaggregative E coli (EAEC) NOT DETECTED NOT DETECTED Final   Enteropathogenic E coli (EPEC) NOT DETECTED NOT DETECTED Final   Enterotoxigenic E coli (ETEC) NOT DETECTED NOT DETECTED Final   Shiga like toxin producing E coli (STEC) NOT DETECTED NOT DETECTED Final   E. coli O157 NOT DETECTED NOT DETECTED Final   Shigella/Enteroinvasive E coli (EIEC) NOT DETECTED NOT DETECTED Final   Cryptosporidium NOT DETECTED NOT DETECTED Final   Cyclospora cayetanensis NOT DETECTED NOT DETECTED Final   Entamoeba histolytica NOT DETECTED NOT DETECTED Final   Giardia lamblia NOT DETECTED NOT DETECTED Final   Adenovirus F40/41 NOT DETECTED NOT DETECTED Final   Astrovirus NOT DETECTED NOT DETECTED Final   Norovirus GI/GII NOT DETECTED NOT DETECTED Final   Rotavirus A NOT DETECTED NOT DETECTED Final   Sapovirus (I, II, IV, and V) NOT DETECTED NOT DETECTED Final  Surgical pcr screen     Status: None   Collection Time: 01/12/2016 11:27 PM  Result Value Ref Range Status   MRSA, PCR NEGATIVE NEGATIVE Final   Staphylococcus aureus NEGATIVE NEGATIVE Final    Comment:        The Xpert SA Assay (FDA approved for NASAL specimens in patients over 46 years of age), is one  component of a comprehensive surveillance program.  Test performance has been validated by [facility] for patients greater than or equal  to 50 year old. It is not intended to diagnose infection nor to guide or monitor treatment.    12/230 cx Culture KLEBSIELLA PNEUMONIAE   Colony Count >=100,000 COLONIES/ML   Organism ID, Bacteria KLEBSIELLA PNEUMONIAE   Resulting Agency [facility]    Culture & Susceptibility      KLEBSIELLA PNEUMONIAE     Antibiotic Sensitivity Microscan Status    AMOX/CLAVULANIC Sensitive 4 Final    AMPICILLIN Resistant >=32 Final    AMPICILLIN/SULBACTAM Intermediate 16 Final    CEFAZOLIN Not Reportable <=4 Final    CEFEPIME Sensitive <=1 Final    CEFTAZIDIME Sensitive <=1 Final    CEFTRIAXONE Sensitive <=1 Final    CIPROFLOXACIN Sensitive <=0.25 Final    GENTAMICIN Sensitive <=1 Final    IMIPENEM Sensitive <=0.25 Final    LEVOFLOXACIN Sensitive 1 Final    NITROFURANTOIN Intermediate 64 Final    PIP/TAZO Sensitive 16 Final    TOBRAMYCIN Sensitive <=1 Final    TRIMETH/SULFA Sensitive <=20            IMAGING: Dg Abd 1 View  01/12/2016  CLINICAL DATA:  Abdominal pain.  History of Celsius difficile. EXAM: ABDOMEN - 1 VIEW COMPARISON:  Multiple abdominal radiographs dating from 09/11/2015 through 12/07/2015. FINDINGS: Diffuse gaseous distention of small and large bowel. Large EU shaped bowel loop in the pelvis. Similar changes have been present on multiple prior studies. Appearance suggests diffuse adynamic ileus. Pelvic loop may indicate sigmoid volvulus. Stool-filled rectum with rectal tube in place. No radiopaque stones. Visualized bones appear intact. IMPRESSION: Diffuse gas-filled and distended small and large bowel likely to represent ileus. Prominent gas-filled bowel loops in the pelvis could also represent sigmoid volvulus. Stool filled rectum with rectal tube present. Similar findings have been present on prior  comparison studies. Electronically Signed   By: Lucienne Capers M.D.   On: 01/12/2016 00:43   Dg Chest Port 1 View  01/13/2016  CLINICAL DATA:  Respiratory failure. EXAM: PORTABLE CHEST 1 VIEW COMPARISON:  Chest x-ray 01/13/2016 12/31/2015. Abdominal series 618 2017. FINDINGS: Mediastinum hilar structures are normal. Low lung volumes with bibasilar atelectasis and or infiltrates. Interim near complete clearing of left upper lobe infiltrate. Heart size stable. Small left pleural effusion. Progressive bowel distention. Abdominal series suggested for further evaluation. Interposition of the colon under the right hemidiaphragm noted. No acute osseous abnormality. IMPRESSION: 1. Low lung volumes with bibasilar atelectasis and/or infiltrates again noted. Interim near complete clearing of left upper lobe infiltrate. 2. Progressive bowel distention. Abdominal series suggested for further evaluation. Electronically Signed   By: Marcello Moores  Register   On: 01/13/2016 07:24   Dg Chest Port 1 View  01/09/2016  CLINICAL DATA:  Sepsis.  Hypotension. EXAM: PORTABLE CHEST 1 VIEW COMPARISON:  09/23/2015 FINDINGS: Lung volumes are low. Diffuse patchy opacity throughout the left hemithorax. Improved right basilar aeration from prior exam. Minimal right infrahilar atelectasis. No definite pleural effusion. Heart size and mediastinal contours are unchanged. No pneumothorax. Gaseous distention of bowel loops in the upper abdomen, similar to prior. Bones appear under mineralized. IMPRESSION: Patchy opacities throughout the left lung, pneumonia favored over asymmetric pulmonary edema but Electronically Signed   By: Jeb Levering M.D.   On: 01/03/2016 20:05   CT 12/2014 CLINICAL DATA: Urinary retention. Urinary tract infection.  EXAM: CT ABDOMEN AND PELVIS WITHOUT CONTRAST  TECHNIQUE: Multidetector CT imaging of the abdomen and pelvis was performed following the standard protocol without IV contrast.  COMPARISON:  None.  FINDINGS: Musculoskeletal: No aggressive osseous lesions.  Lung Bases: Atelectasis.  Liver: Unenhanced CT was performed per clinician order. Lack of IV contrast limits sensitivity and specificity, especially for evaluation of abdominal/pelvic solid viscera. Grossly normal.  Spleen: Normal.  Gallbladder: Normal.  Common bile duct: Normal.  Pancreas: Normal.  Adrenal glands: Normal bilaterally.  Kidneys: Partially duplicated LEFT renal collecting system with prominent column a per 10. Tiny E LEFT upper pole area increased density probably representing milk of calcium in a cyst. No ureteral calculi. No hydronephrosis or perinephric inflammatory stranding.  Stomach: Distended with oral contrast.  Small bowel: Normal.  Colon: Large stool burden. Normal appendix. No obstruction or inflammatory changes of colon.  Pelvic Genitourinary: Distended urinary bladder. No inflammatory changes, debris, gas or mural thickening. Uterus and adnexa appear within normal limits.  Peritoneum: No free air or free fluid.  Vascular/lymphatic: Atherosclerosis without an acute vascular abnormality allowing for noncontrast technique.  Body Wall: RIGHT lower quadrant anterior abdominal wall subcutaneous injection site. Nonspecific stranding lateral to the LEFT hip in the subcutaneous fat.  IMPRESSION: 1. No acute abnormality. 2. Atherosclerosis.  Assessment:   Teja Costen is a 55 y.o. 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 Prior cxs with VRE and Klebsiella - currently cx pending.  On this admission seems to not have UTI with UA unimpressive but instead has recurrent C diff Recommendations Continue oral vanco for C diff. CXR and abd imaging suggests ileus. So would continue IV flagyl as well  Would hold on abx for the UTI pending culture but is likely colonization with nml UA  Thank you very much for allowing me  to participate in the care of this patient. Please call with questions.   Jennifer Marker. Ola Spurr, MD

## 2016-01-14 ENCOUNTER — Inpatient Hospital Stay: Payer: BLUE CROSS/BLUE SHIELD

## 2016-01-14 DIAGNOSIS — Z515 Encounter for palliative care: Secondary | ICD-10-CM

## 2016-01-14 DIAGNOSIS — L89152 Pressure ulcer of sacral region, stage 2: Secondary | ICD-10-CM

## 2016-01-14 DIAGNOSIS — B9689 Other specified bacterial agents as the cause of diseases classified elsewhere: Secondary | ICD-10-CM

## 2016-01-14 DIAGNOSIS — J690 Pneumonitis due to inhalation of food and vomit: Secondary | ICD-10-CM

## 2016-01-14 DIAGNOSIS — E43 Unspecified severe protein-calorie malnutrition: Secondary | ICD-10-CM

## 2016-01-14 DIAGNOSIS — G1221 Amyotrophic lateral sclerosis: Secondary | ICD-10-CM

## 2016-01-14 DIAGNOSIS — A4189 Other specified sepsis: Secondary | ICD-10-CM

## 2016-01-14 DIAGNOSIS — J9600 Acute respiratory failure, unspecified whether with hypoxia or hypercapnia: Secondary | ICD-10-CM

## 2016-01-14 DIAGNOSIS — N179 Acute kidney failure, unspecified: Secondary | ICD-10-CM

## 2016-01-14 DIAGNOSIS — N390 Urinary tract infection, site not specified: Secondary | ICD-10-CM

## 2016-01-14 DIAGNOSIS — F3289 Other specified depressive episodes: Secondary | ICD-10-CM | POA: Diagnosis not present

## 2016-01-14 DIAGNOSIS — Z87891 Personal history of nicotine dependence: Secondary | ICD-10-CM

## 2016-01-14 DIAGNOSIS — Z87440 Personal history of urinary (tract) infections: Secondary | ICD-10-CM | POA: Diagnosis not present

## 2016-01-14 DIAGNOSIS — E876 Hypokalemia: Secondary | ICD-10-CM

## 2016-01-14 DIAGNOSIS — R109 Unspecified abdominal pain: Secondary | ICD-10-CM | POA: Insufficient documentation

## 2016-01-14 LAB — CBC
HCT: 33.8 % — ABNORMAL LOW (ref 35.0–47.0)
Hemoglobin: 11.5 g/dL — ABNORMAL LOW (ref 12.0–16.0)
MCH: 31.5 pg (ref 26.0–34.0)
MCHC: 33.9 g/dL (ref 32.0–36.0)
MCV: 93 fL (ref 80.0–100.0)
PLATELETS: 235 10*3/uL (ref 150–440)
RBC: 3.63 MIL/uL — AB (ref 3.80–5.20)
RDW: 13.4 % (ref 11.5–14.5)
WBC: 19.6 10*3/uL — ABNORMAL HIGH (ref 3.6–11.0)

## 2016-01-14 LAB — URINE CULTURE: Culture: >=100000 — AB

## 2016-01-14 LAB — BASIC METABOLIC PANEL
Anion gap: 2 — ABNORMAL LOW (ref 5–15)
BUN: 17 mg/dL (ref 6–20)
CALCIUM: 7.7 mg/dL — AB (ref 8.9–10.3)
CO2: 20 mmol/L — ABNORMAL LOW (ref 22–32)
CREATININE: 0.55 mg/dL (ref 0.44–1.00)
Chloride: 120 mmol/L — ABNORMAL HIGH (ref 101–111)
GFR calc Af Amer: >60 mL/min (ref >60)
GLUCOSE: 106 mg/dL — AB (ref 65–99)
Potassium: 3.1 mmol/L — ABNORMAL LOW (ref 3.5–5.1)
SODIUM: 142 mmol/L (ref 135–145)

## 2016-01-14 MED ORDER — IBUPROFEN 100 MG/5ML PO SUSP
400.0000 mg | Freq: Two times a day (BID) | ORAL | Status: DC
  Administered 2016-01-14 – 2016-01-15 (×2): 400 mg via ORAL
  Filled 2016-01-14 (×4): qty 20

## 2016-01-14 MED ORDER — KCL IN DEXTROSE-NACL 20-5-0.9 MEQ/L-%-% IV SOLN
INTRAVENOUS | Status: DC
  Administered 2016-01-14 – 2016-01-16 (×4): via INTRAVENOUS
  Filled 2016-01-14 (×6): qty 1000

## 2016-01-14 MED ORDER — MORPHINE SULFATE (PF) 4 MG/ML IV SOLN
4.0000 mg | INTRAVENOUS | Status: DC | PRN
  Administered 2016-01-15 (×2): 4 mg via INTRAVENOUS
  Filled 2016-01-14: qty 1

## 2016-01-14 MED ORDER — DIAZEPAM 5 MG/ML IJ SOLN
5.0000 mg | INTRAMUSCULAR | Status: DC | PRN
  Administered 2016-01-14 – 2016-01-16 (×7): 5 mg via INTRAVENOUS
  Filled 2016-01-14 (×8): qty 2

## 2016-01-14 MED ORDER — MORPHINE SULFATE (PF) 2 MG/ML IV SOLN: 2.0000 mg | INTRAVENOUS | Status: DC | PRN

## 2016-01-14 MED ORDER — MORPHINE SULFATE (PF) 4 MG/ML IV SOLN
3.0000 mg | INTRAVENOUS | Status: DC | PRN
  Administered 2016-01-14 – 2016-01-15 (×2): 3 mg via INTRAVENOUS
  Filled 2016-01-14 (×3): qty 1

## 2016-01-14 MED ORDER — ACETAMINOPHEN 325 MG PO TABS
650.0000 mg | ORAL_TABLET | Freq: Four times a day (QID) | ORAL | Status: DC | PRN
  Administered 2016-01-14 (×2): 650 mg via ORAL
  Filled 2016-01-14 (×3): qty 2

## 2016-01-14 MED ORDER — LINEZOLID 600 MG PO TABS
600.0000 mg | ORAL_TABLET | Freq: Two times a day (BID) | ORAL | Status: DC
  Administered 2016-01-14 – 2016-01-15 (×3): 600 mg via ORAL
  Filled 2016-01-14 (×5): qty 1

## 2016-01-14 MED ORDER — POTASSIUM CHLORIDE 10 MEQ/100ML IV SOLN
10.0000 meq | INTRAVENOUS | Status: AC
  Administered 2016-01-14 (×2): 10 meq via INTRAVENOUS
  Filled 2016-01-14 (×2): qty 100

## 2016-01-14 MED ORDER — MORPHINE SULFATE (PF) 4 MG/ML IV SOLN: 3.0000 mg | INTRAVENOUS | Status: DC | PRN

## 2016-01-14 MED ORDER — ACETAMINOPHEN 325 MG PO TABS
650.0000 mg | ORAL_TABLET | Freq: Two times a day (BID) | ORAL | Status: DC
  Administered 2016-01-15: 650 mg via ORAL

## 2016-01-14 NOTE — Progress Notes (Addendum)
Palliative Care Update  I met with pt soon after he came back today. Daughter was also in the room. He was quite upset, saying, "Of course, she is moaning --because her temperature is 102!".  He said he felt her forehead and he can tell by feeling her forehead that her temperature is 102.  Nursing had checked her vitals using the ear thermometer only an hour earlier when I was in and she was not febrile. The thermometer that goes under her arm wasn't working b/c of her contractures and spasms.  Family says the ear thermometer is not accurate.  I was able to stay with pt and family for a while after getting nursing to come in and give her some more tylenol. Vitals are being rechecked. I explained to husband that the nurses had gotten very concerned about pts condition, and though we don't like her to have the discomfort of a fever, in fact, the real reason she is moaning is the ileus she has.  I explained what an ileus is and that the C Diff is causing it, but the morphine we give for the pain can make it worse.  I explained that we normally don't feed a pt with an ileus. Feeding doesn't help recovery from an ileus.  BUT not feeding her 'doesn't help' overall recovery.  I did mention that 'this may be something she does not recover from' and that it may be hard to get and keep her comfortable now unless we make her a 'comfort care only' pt.  I asked if he understood what I was talking about and he said, "I know exactly what you're talking about.'  I mentioned at one point that he may want to consider choosing to have Hospice come into the home --if she makes it home.  I said that it would be in addition to the care-giver who lives with pt and in addition to all the care he and his daughter provide. I mentioned that it might now be time.   He did not verybally reject this, and he did not seem upset about this subject being brought up.  But, he did not seem to be at the point where he was ready to choose this  for his wife.    For now, I am going to order some tylenol and ibuprofen alternating q 6hrs.  Pts temp was 99 point something by ear and by axillary detections so her temp is NOT 102 as husband insisted it was.  He left the room before they got this result.  When I felt pt's forehead, she felt warm, but not 102 degrees warm. When I told him my impression, he just said, "its 102!'  I am going to increase the morphine orders further. This won't help the ileus. The Zyvox might help the UTI --but it won't help the C diff.   She also has an O2 sat that is lower than it has been (now 91 % on room air).  She has infiltrates/ atetectasis on CXR.  I will order some O2 --as a comfort measure.  At this point, pt is not comfort care, but I see no realistic hope for recovery since she will continue to lose weight as she battles these acute infections. As she loses weight, she will lose immune function and lose more strength.  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  For now, she seems to  swallow OK, but this is on a very restricted diet of Dysphagia one with NECTAR thick liquids.  For now, husband agrees with changing her to full liquids (they will be thickened) due to the illness. Again, feeding a pt with an ileus doesn't help but not feeding her is a no win option also.    Husband understands we agreed on:   --alternating routine tylenol with ibuprofen   --increasing morphine to 3 and 4 mg prn mod/ severe pain q 3 hrs   --changing her diet to full nectar liquids and only giving her what she wants   --starting oxygen   Will follow with you.    Colleen Can , MD  Time with pt/ family today greater than 2 hours.  Counselling = >50%

## 2016-01-14 NOTE — Progress Notes (Signed)
Patient arouses to voice only. New bed in place for comfort. Gave PRN meds regularly. Dressing to coccyx area is intact. Scat bruising to bilat forearms. Foley put out 250cc, brown in color. Rectal tube of 150cc of liquid stool. Tolerating diet. Meds crushed in applesause. IV fluids running. Gave two doses of IV potassium this shift. Isolation for positive C.Diff, receiving IV ABX. ABD more distended and hard this PM, MD aware.

## 2016-01-14 NOTE — Care Management Note (Signed)
Case Management Note  Patient Details  Name: Sula RumpleKatherine Blough MRN: [id number] Date of Birth: 14-Oct-1960  Subjective/Objective:                  Spoke with patient's husband over the phone. He agrees to palliative in the home setting and would like to continue home health services [facility]. She has "a good set up at home"- hospital bed, wheelchair, etc. He states he will need EMS to home. He plans for her to return home at discharge.   Action/Plan:  RNCM will continue to follow. [facility] aware of this admission.  Expected Discharge Date:                  Expected Discharge Plan:     In-House Referral:     Discharge planning Services  CM Consult  Post Acute Care Choice:  Home Health Choice offered to:  Patient  DME Arranged:    DME Agency:     HH Arranged:    HH Agency:  [facility]  Status of Service:  In process, will continue to follow  If discussed at Long Length of Stay Meetings, dates discussed:    Additional Comments:  Collie Siadngela Rodell Marrs, RN 01/14/2016, 2:27 PM

## 2016-01-14 NOTE — Consult Note (Addendum)
Palliative Medicine Inpatient Consult Note   Name: Jennifer [name] Date: 01/14/2016 MRN: [id number]  DOB: [birth year]  Referring Physician: Auburn Bilberry, MD  Palliative Care consult requested for this 55 y.o. female for goals of medical therapy in patient with very END STAGE ALS.  DISCUSSIONS AND PLANS: When I examined pt, husband had gone home. Pt's caregiver was bedside and two nurses were in the room.  Pt is not able to speak words and is only moaning. Her abdomen is hard to touch but she has some bowel sounds.  Nurses are asking for an increase in narcotics due to her severe pain.    I spoke with Dr. Sampson Goon who has seen pt over the years.He notes the drastic deterioration she has had in recent months. She is at the end of her life and she continues to be severely ill and may not recover.  A stool transplant might be an option for severe C Diff, but her end-stage ALS symptoms won't be helped by this intervention (and would not be undertaken unless there is evidence that the pt herself would recover-- and not just her colon_.   Nurses will page me when pt's husband arrives. I will increase prn morphine.  She actually could be quite appropriate for hospice home, but I think that husband will likely choose to continue with ABX and he will likely want to hold onto the hope for recovery for his wife.  This is based on review of notes from [facility] neurology visits where Hospice was recommended but declined by pt/ husband.  Pt has consistently not wanted a feeding tube, however. That is documented in a number of notes.     CLINICAL NARRATIVE: Pt had become weak at home and had started having diarrhea on 6/17. She was brought here and initially was not to have pressors started. However, that changed when pt's systolic BP dropped to 54.  Husband related that pt desired pressors and central line placement and this was initiated. She was transferred to the ICU and was treated for a UTI with Zosyn and  Vancomycin.  The diarrhea was treated with Flagyl. A rectal tube was placed. She complained of abdominal cramping. She gradually improved.    SLP evaluated pt and conveyed pt's degree of dysphagia to me.  Pt is apparently at her baseile.  Pt requires max assist with feeding d/t positioning and overall weakness.  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 Feeding tube had been discussed with pt and husband last admission and this was not wanted at this time.   She has a stage 3 pressure ulcer to sacrum on right side 2 cm by 2 cm by 0.2 cm..  Urine cx has grown .100k colonies of VRE and also 30 k colonies of klebsiella pneumonia.  ID consult has rec: no ABX for these organsims at this time as it is likely colonization but to continue the oral vanco and IV Flagyl for the C diff colitis (now with ileus).    Despite findings of ileus, she has a diet order for Dysphagia 1 with thins.  She has only taken ini very little, however, until today at lunch when she ate better per nurse.  Nurse is asking for KUB and also increased morphine due to evidence of pt being in a lot of pain with frequent moaning and grimacing. Valium was given and no relief is noted.     ACTIVE PROBLEMS: Septic Shock (initial BP was 66/40) --  with leukocytosis Acute UTI vs colonization ---with h/o recurrent UTIs ---now growing VRE >100K colonies and also 30K klebsiella pneumo -------previously had similar organisms with UTI in Feb and got Ertapenam, Dapto, and Fosfomycin. ---blood cxs negative from 6/17. ABX for UTI held per ID recs.   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  BMI is 14.11.   ---related to dysphagia and chronic illness with severe depletion of body fat and muscle ---with stage 3 right sacral pressure ulcer  ------present on admission  ------2 cm by 2 cm by 0.2 cm H/O acute resp failure with intubation in Feb 2017  ________________________________________________________________    REVIEW OF SYSTEMS:  Pt is moaning constantly and loudly as I enter the room where 2 nurses are tending to her and checking vital signs.  Pt actually 'ate well' at lunch when fed but continues to have a very hard abdomen. Nurses feel pt is in a lot of untreated pain at this moment and an increase in narcotics is requested.  Pt does not talk to me and does not open eyes. She grimaces and moans short moans back to back.   SPIRITUAL SUPPORT SYSTEM: Yes husband and paid live-in care-giver.  .  SOCIAL HISTORY:  reports that she quit smoking about 8 years ago. She has never used smokeless tobacco. She reports that she does not drink alcohol or use illicit drugs.Pt lives at home and reportedly has all the DME they need.  There is a privately paid live-in care-giver who is present also here at the hospital with pt.    LEGAL DOCUMENTS:  DNR form  CODE STATUS: DNR  PAST MEDICAL HISTORY: Past Medical History  Diagnosis Date  . ALS (amyotrophic lateral sclerosis) (HCC)   . Depression   . GERD (gastroesophageal reflux disease)   . Urinary retention   . Dysphagia, oropharyngeal 11/28/2013  . CN (constipation) 11/28/2013    PAST SURGICAL  HISTORY:  Past Surgical History  Procedure Laterality Date  . Cesarean section    . Nasal sinus surgery      ALLERGIES:  is allergic to bee venom; hydrocodone; lipitor; shellfish allergy; oxycodone; prednisone; vicodin; acyclovir and related; oxycontin; and peanut-containing drug products.  MEDICATIONS:  Current Facility-Administered Medications  Medication Dose Route Frequency Provider Last Rate Last Dose  . acetaminophen (TYLENOL) tablet 650 mg  650 mg Oral Q6H PRN Auburn Bilberry, MD   650 mg at 01/14/16 0820  . atropine 1 % ophthalmic solution 2 drop  2 drop Sublingual PRN Gracelyn Nurse, MD      . buPROPion [facility]) tablet 150 mg  150 mg Oral QID Erin Fulling, MD   150 mg at 01/14/16 1319  . Dextromethorphan-Quinidine 20-10 MG CAPS 1 capsule  1 capsule Oral BID Auburn Bilberry, MD   1 capsule at  01/21/2016 2300  . dextrose 5 % and 0.9 % NaCl with KCl 20 mEq/L infusion   Intravenous Continuous Auburn Bilberry, MD 75 mL/hr at 01/14/16 1104    . diazepam (VALIUM) tablet 5 mg  5 mg Oral Q4H PRN Erin Fulling, MD   5 mg at 01/14/16 1105  . famotidine (PEPCID) tablet 20 mg  20 mg Oral Daily Erin Fulling, MD   20 mg at 01/14/16 0957  . heparin injection 5,000 Units  5,000 Units Subcutaneous Q8H Lewie Loron, NP   5,000 Units at 01/14/16 0820  . lactated ringers infusion   Intravenous Continuous Lewie Loron, NP 75 mL/hr at 01/13/16 2006    . levETIRAcetam (KEPPRA) 100 MG/ML solution 500 mg  500 mg Oral BID Erin Fulling, MD   500 mg at 01/14/16 0958  . linezolid (ZYVOX) tablet 600 mg  600 mg Oral Q12H Mick Sell, MD      . metroNIDAZOLE (FLAGYL) IVPB 500 mg  500 mg Intravenous Q8H Lewie Loron, NP   500 mg at 01/14/16 1026  . morphine 2 MG/ML injection 1-2 mg  1-2 mg Intravenous Q3H PRN Merwyn Katos, MD   2 mg at 01/14/16 1317  . tiZANidine (ZANAFLEX) tablet 2 mg  2 mg Oral QHS Gracelyn Nurse, MD   2 mg at 01/13/16 2226  . vancomycin (VANCOCIN) 50 mg/mL oral solution  125 mg  125 mg Oral Q6H Lewie Loron, NP   125 mg at 01/14/16 1319    Vital Signs: BP 167/81 mmHg  Pulse 92  Temp(Src) 99.4 F (37.4 C) (Oral)  Resp 18  Ht  (1.626 m)  Wt 37.3 kg (82 lb 3.7 oz)  BMI 14.11 kg/m2  SpO2 91% Filed Weights   01/09/2016 2005 01/10/2016 2310  Weight: 36.798 kg (81 lb 2 oz) 37.3 kg (82 lb 3.7 oz)    Estimated body mass index is 14.11 kg/(m^2) as calculated from the following:   Height as of this encounter:  (1.626 m).   Weight as of this encounter: 37.3 kg (82 lb 3.7 oz).  PERFORMANCE STATUS (ECOG) : 4 - Bedbound  PHYSICAL EXAM: Pt is being cared for by two nurses She has a contorted posture in bed and is positioned by pillows She seems to be unable to move on her own at this moment She is moaning constantly and has a grimace on her face Eyes are nearly closed No JVD or TM Hrt rrr tachy at 109 Abd is distended and tight with scant BS Ext foot drop and contractures noted Skin --sacral decubitus is documented but not seen this admission.   LABS: CBC:    Component Value Date/Time   WBC 19.6* 01/14/2016 0315   WBC 9.8 10/15/2013 0545   HGB 11.5* 01/14/2016 0315   HGB 12.4 10/15/2013 0545   HCT 33.8* 01/14/2016 0315   HCT 35.7 10/15/2013 0545   PLT 235 01/14/2016 0315   PLT 213 10/15/2013 0545   MCV 93.0 01/14/2016 0315   MCV 94 10/15/2013 0545   NEUTROABS 10.9* 01/22/2016 1909   NEUTROABS 8.1* 10/15/2013 0545   LYMPHSABS 0.6* 01/21/2016 1909   LYMPHSABS 1.2 10/15/2013 0545   MONOABS 0.2 01/17/2016 1909   MONOABS 0.4 10/15/2013 0545   EOSABS 0.1 12/26/2015 1909   EOSABS 0.1 10/15/2013 0545   BASOSABS 0.0 01/02/2016 1909   BASOSABS 0.0 10/15/2013 0545   Comprehensive Metabolic Panel:    Component Value Date/Time   NA 142 01/14/2016  0315   NA 140 10/15/2013 0545   K 3.1* 01/14/2016 0315   K 3.5 10/15/2013 0545   CL 120* 01/14/2016 0315   CL 110* 10/15/2013 0545   CO2 20* 01/14/2016 0315   CO2 25 10/15/2013 0545    BUN 17 01/14/2016 0315   BUN 10 10/15/2013 0545   CREATININE 0.55 01/14/2016 0315   CREATININE 0.63 10/15/2013 0545   GLUCOSE 106* 01/14/2016 0315   GLUCOSE 71 10/15/2013 0545   CALCIUM 7.7* 01/14/2016 0315   CALCIUM 8.7 10/15/2013 0545   AST 39 2016/06/06 1909   AST 30 10/14/2013 1716   ALT 43 2016/06/06 1909   ALT 20 10/14/2013 1716   ALKPHOS 58 2016/06/06 1909   ALKPHOS 62 10/14/2013 1716   BILITOT 1.0 2016/06/06 1909   BILITOT 0.3 10/14/2013 1716   PROT 5.1* 2016/06/06 1909   PROT 7.9 10/14/2013 1716   ALBUMIN 2.5* 2016/06/06 1909   ALBUMIN 4.1 10/14/2013 1716    More than 50% of the visit was spent in counseling/coordination of care: Yes  Time Spent: 120 minutes

## 2016-01-14 NOTE — Progress Notes (Signed)
[facility] [place] [facility]   PATIENT NAME: Jennifer [name]    MR#:  [id number]  DATE OF BIRTH:  26-Jun-1961  is at the beds Chief Complaint  Patient presents with  . Urinary Tract Infection  . Code Sepsis  Pt moaning unable to communicate   REVIEW OF SYSTEMS:   Review of Systems  Unable to perform Due to chronic base line    DRUG ALLERGIES:   Allergies  Allergen Reactions  . Bee Venom Anaphylaxis  . Hydrocodone Other (See Comments)    Reaction:  Muscle stiffness   . Lipitor [Atorvastatin] Other (See Comments)    Reaction:  Muscle cramps   . Shellfish Allergy Anaphylaxis  . Oxycodone Other (See Comments)    Reaction:  Muscle stiffness   . Prednisone Other (See Comments)    Reaction:  Psychosis   . Vicodin [Hydrocodone-Acetaminophen] Other (See Comments)    Reaction:  Muscle stiffness   . Acyclovir And Related   . Oxycontin [Oxycodone Hcl] Other (See Comments)    Reaction:  Muscle stiffness   . Peanut-Containing Drug Products Diarrhea    VITALS:  Blood pressure 167/81, pulse 92, temperature 99.4 F (37.4 C), temperature source Oral, resp. rate 18, height  (1.626 m), weight 37.3 kg (82 lb 3.7 oz), SpO2 91 %.  PHYSICAL EXAMINATION:  GENERAL:  55 y.o.-year-old patient lying in the bed with no acute distress. CACHECTIC,Patient is ill-looking.  EYES: Pupils equal, round, reactive to light and accommodation. No scleral icterus. Extraocular muscles intact.  HEENT: Head atraumatic, normocephalic. Oropharynx and nasopharynx clear.  NECK:  Supple, no jugular venous distention. No thyroid enlargement, no tenderness.  LUNGS: Normal breath sounds bilaterally, no wheezing, rales,rhonchi or crepitation. No use of accessory muscles of respiration. DIMINISHED AT BASES, CARDIOVASCULAR: S1, S2 normal. No murmurs, rubs, or gallops.  ABDOMEN: Soft, nontender, distended. Bowel sounds present. No organomegaly or mass.  EXTREMITIES: No pedal edema,  cyanosis, or clubbing.  Neurological: Awake, follows some basic commands,  Psych awake   SKIN: No obvious rash, lesion, or ulcer.    LABORATORY PANEL:   CBC  Recent Labs Lab 01/14/16 0315  WBC 19.6*  HGB 11.5*  HCT 33.8*  PLT 235   ------------------------------------------------------------------------------------------------------------------  Chemistries   Recent Labs Lab 01/12/2016 1909  01/13/16 0314 01/14/16 0315  NA 131*  < > 139 142  K 5.1  < > 3.0* 3.1*  CL 103  < > 117* 120*  CO2 14*  < > 15* 20*  GLUCOSE 81  < > 77 106*  BUN 51*  < > 29* 17  CREATININE 2.71*  < > 0.79 0.55  CALCIUM 8.0*  < > 7.9* 7.7*  MG  --   --  1.9  --   AST 39  --   --   --   ALT 43  --   --   --   ALKPHOS 58  --   --   --   BILITOT 1.0  --   --   --   < > = values in this interval not displayed. ------------------------------------------------------------------------------------------------------------------  Cardiac Enzymes  Recent Labs Lab 12/27/2015 1909  TROPONINI 0.16*   ------------------------------------------------------------------------------------------------------------------  RADIOLOGY:  Dg Chest Port 1 View  01/13/2016  CLINICAL DATA:  Respiratory failure. EXAM: PORTABLE CHEST 1 VIEW COMPARISON:  Chest x-ray 01/13/2016 01/20/2016. Abdominal series 618 2017. FINDINGS: Mediastinum hilar structures are normal. Low lung volumes with bibasilar atelectasis and or infiltrates. Interim near complete clearing of  left upper lobe infiltrate. Heart size stable. Small left pleural effusion. Progressive bowel distention. Abdominal series suggested for further evaluation. Interposition of the colon under the right hemidiaphragm noted. No acute osseous abnormality. IMPRESSION: 1. Low lung volumes with bibasilar atelectasis and/or infiltrates again noted. Interim near complete clearing of left upper lobe infiltrate. 2. Progressive bowel distention. Abdominal series suggested for  further evaluation. Electronically Signed   By: Maisie Fushomas  Register   On: 01/13/2016 07:24    EKG:   Orders placed or performed during the hospital encounter of 01/21/2016  . EKG 12-Lead  . EKG 12-Lead    ASSESSMENT AND PLAN:  Patient's a 55 year old with diagnosis of ALS # 1. septic shock: Present on admission: She is due to combination of recurrent C. difficile  Appreciate infectious disease input, continue flagyl and vancomycin #2 acute renal failure due to ATN from sepsis is always IV hydration resolved #3 history of amyotrophic lateral sclerosis: Has chronic Foley catheter. Prognosis poor  #5 GERD continue PPIs #6 mild hyperkalemia: We'll give her dose of potassium #7. elevated troponins likely due to sepsis:   All the records are reviewed and case discussed with Care Management/Social Workerr. Management plans discussed with the patient, family and they are in agreement.   D/w husband states he wants to continue antibiotic CODE STATUS:dnr  TOTAL TIME TAKING CARE OF THIS PATIENT: 32min  POSSIBLE D/C IN 5-6 DAYS, DEPENDING ON CLINICAL CONDITION.   Auburn BilberryPATEL, Jennifer [name] M.D on 01/14/2016 at 1:21 PM  Between 7am to 6pm - Pager - [phone]  After 6pm go to [email or web address] - password [email or web address]  [facility]  Office  [phone]  CC: Primary care physician; Jennifer Mannanalia Aron, MD   Note: This dictation was prepared with Dragon dictation along with smaller phrase technology. Any transcriptional errors that result from this process are unintentional.

## 2016-01-14 NOTE — Care Management (Signed)
I have considered HRI for this patient that is open to [facility] which would increase home visits in hopes to prevent rehospitalization. It is unclear if that would truly help patient's wellbeing considering comorbidities. RNCM will continue to follow with this in mind (will discuss with team). Palliative consult pending.

## 2016-01-14 NOTE — Progress Notes (Signed)
[facility] INFECTIOUS DISEASE PROGRESS NOTE Date of Admission:  01/12/16     ID: Jennifer [name] is a 55 y.o. female with ALS, recurrent UTI, chronic cath, C diff  Active Problems:   Sepsis (HCC)   C. difficile colitis   Subjective: Transferred out of unit yesterday. Developed high fever, wbc still up. abd distented, is eating and getting oral vanco   ROS unable to obtain   Medications:  Antibiotics Given (last 72 hours)    Date/Time Action Medication Dose Rate   01/12/16 0203 Given   metroNIDAZOLE (FLAGYL) IVPB 500 mg 500 mg 100 mL/hr   01/12/16 0207 Given   vancomycin (VANCOCIN) 50 mg/mL oral solution 125 mg 125 mg    01/12/16 0343 Given   piperacillin-tazobactam (ZOSYN) IVPB 3.375 g 3.375 g 12.5 mL/hr   01/12/16 0346 Given   vancomycin (VANCOCIN) 500 mg in sodium chloride 0.9 % 100 mL IVPB 500 mg 100 mL/hr   01/12/16 0538 Given   vancomycin (VANCOCIN) 50 mg/mL oral solution 125 mg 125 mg    01/12/16 0759 Given   metroNIDAZOLE (FLAGYL) IVPB 500 mg 500 mg 100 mL/hr   01/12/16 1448 Given   piperacillin-tazobactam (ZOSYN) IVPB 3.375 g 3.375 g 12.5 mL/hr   01/12/16 1718 Given   metroNIDAZOLE (FLAGYL) IVPB 500 mg 500 mg 100 mL/hr   01/12/16 2233 Given   piperacillin-tazobactam (ZOSYN) IVPB 3.375 g 3.375 g 12.5 mL/hr   01/13/16 0111 Given   metroNIDAZOLE (FLAGYL) IVPB 500 mg 500 mg 100 mL/hr   01/13/16 0526 Given   piperacillin-tazobactam (ZOSYN) IVPB 3.375 g 3.375 g 12.5 mL/hr   01/13/16 1122 Given  [had to request from pharmacy]   metroNIDAZOLE (FLAGYL) IVPB 500 mg 500 mg 100 mL/hr   01/13/16 1122 Given   vancomycin (VANCOCIN) 50 mg/mL oral solution 125 mg 125 mg    01/13/16 1646 Given   metroNIDAZOLE (FLAGYL) IVPB 500 mg 500 mg 100 mL/hr   01/13/16 2007 Given   vancomycin (VANCOCIN) 50 mg/mL oral solution 125 mg 125 mg    01/14/16 0104 Given   metroNIDAZOLE (FLAGYL) IVPB 500 mg 500 mg 100 mL/hr   01/14/16 0104 Given   vancomycin (VANCOCIN) 50 mg/mL oral  solution 125 mg 125 mg    01/14/16 0957 Given   vancomycin (VANCOCIN) 50 mg/mL oral solution 125 mg 125 mg    01/14/16 1026 Given   metroNIDAZOLE (FLAGYL) IVPB 500 mg 500 mg 100 mL/hr   01/14/16 1319 Given   vancomycin (VANCOCIN) 50 mg/mL oral solution 125 mg 125 mg      . buPROPion  150 mg Oral QID  . Dextromethorphan-Quinidine  1 capsule Oral BID  . famotidine  20 mg Oral Daily  . heparin  5,000 Units Subcutaneous Q8H  . levETIRAcetam  500 mg Oral BID  . linezolid  600 mg Oral Q12H  . metronidazole  500 mg Intravenous Q8H  . tiZANidine  2 mg Oral QHS  . vancomycin  125 mg Oral Q6H    Objective: Vital signs in last 24 hours: Temp:  [98 F (36.7 C)-101.6 F (38.7 C)] 99.4 F (37.4 C) (06/20 0920) Pulse Rate:  [90-96] 92 (06/20 0817) Resp:  [17-20] 18 (06/20 0817) BP: (102-167)/(64-92) 167/81 mmHg (06/20 0325) SpO2:  [91 %-94 %] 91 % (06/20 0817) GENERAL: curled on side. Thin, moaning in pain  EYES: Pupils equal, round, reactive to light and accommodation. No scleral icterus.  HEENT: Head atraumatic, normocephalic. No oropharyngeal erythema, moist oral dry NECK: Supple, no jugular  venous distention. No thyroid enlargement, no tenderness.  LUNGS: Normal breath sounds bilaterally, no wheezing, rales, rhonchi. No use of accessory muscles of respiration.  CARDIOVASCULAR: S1, S2 normal.  ABDOMEN: Soft, nontender, nondistended. Bowel sounds present. No organomegaly or mass.  EXTREMITIES: No pedal edema, cyanosis, or clubbing. + NEUROLOGIC: Slumped to the right side. Does not follow commands. PSYCHIATRIC: The patient is drowsy. SKIN: No obvious rash, lesion, or ulcer. Foley in place with concentrated urine Rectal tube with loose brown stool   Lab Results  Recent Labs  01/13/16 0314 01/14/16 0315  WBC 17.8* 19.6*  HGB 12.0 11.5*  HCT 35.7 33.8*  NA 139 142  K 3.0* 3.1*  CL 117* 120*  CO2 15* 20*  BUN 29* 17  CREATININE 0.79 0.55    Microbiology: Results  for orders placed or performed during the hospital encounter of 12/25/2015  Blood Culture (routine x 2)     Status: None (Preliminary result)   Collection Time: 12/25/2015  7:09 PM  Result Value Ref Range Status   Specimen Description BLOOD RIGHT FOREARM  Final   Special Requests BOTTLES DRAWN AEROBIC AND ANAEROBIC 1 CC  Final   Culture NO GROWTH 3 DAYS  Final   Report Status PENDING  Incomplete  Blood Culture (routine x 2)     Status: None (Preliminary result)   Collection Time: 12/25/2015  7:14 PM  Result Value Ref Range Status   Specimen Description BLOOD LEFT ARM  Final   Special Requests BOTTLES DRAWN AEROBIC AND ANAEROBIC 1 CC  Final   Culture NO GROWTH 3 DAYS  Final   Report Status PENDING  Incomplete  Urine culture     Status: Abnormal   Collection Time: 12/25/2015  7:20 PM  Result Value Ref Range Status   Specimen Description URINE, RANDOM  Final   Special Requests NONE  Final   Culture (A)  Final    >=100,000 COLONIES/mL VANCOMYCIN RESISTANT ENTEROCOCCUS 30,000 COLONIES/mL KLEBSIELLA PNEUMONIAE    Report Status 01/14/2016 FINAL  Final   Organism ID, Bacteria VANCOMYCIN RESISTANT ENTEROCOCCUS (A)  Final   Organism ID, Bacteria KLEBSIELLA PNEUMONIAE (A)  Final      Susceptibility   Klebsiella pneumoniae - MIC*    AMPICILLIN 16 RESISTANT Resistant     CEFAZOLIN <=4 SENSITIVE Sensitive     CEFTRIAXONE <=1 SENSITIVE Sensitive     CIPROFLOXACIN 0.5 SENSITIVE Sensitive     GENTAMICIN <=1 SENSITIVE Sensitive     IMIPENEM <=0.25 SENSITIVE Sensitive     NITROFURANTOIN <=16 SENSITIVE Sensitive     TRIMETH/SULFA <=20 SENSITIVE Sensitive     AMPICILLIN/SULBACTAM 4 SENSITIVE Sensitive     PIP/TAZO <=4 SENSITIVE Sensitive     Extended ESBL NEGATIVE Sensitive     * 30,000 COLONIES/mL KLEBSIELLA PNEUMONIAE   Vancomycin resistant enterococcus - MIC*    AMPICILLIN >=32 RESISTANT Resistant     LEVOFLOXACIN >=8 RESISTANT Resistant     NITROFURANTOIN 128 RESISTANT Resistant     VANCOMYCIN  >=32 RESISTANT Resistant     LINEZOLID 2 SENSITIVE Sensitive     * >=100,000 COLONIES/mL VANCOMYCIN RESISTANT ENTEROCOCCUS  C difficile quick scan w PCR reflex     Status: Abnormal   Collection Time: 12/25/2015 11:14 PM  Result Value Ref Range Status   C Diff antigen POSITIVE (A) NEGATIVE Final   C Diff toxin POSITIVE (A) NEGATIVE Final   C Diff interpretation   Final    Positive for toxigenic C. difficile, active toxin production present.  Comment: CALLED BRITTONLEE RUSTCHESTER AT 0010 ON 01/12/16 [facility]  Gastrointestinal Panel by PCR , Stool     Status: None   Collection Time: January 26, 2016 11:14 PM  Result Value Ref Range Status   Campylobacter species NOT DETECTED NOT DETECTED Final   Plesimonas shigelloides NOT DETECTED NOT DETECTED Final   Salmonella species NOT DETECTED NOT DETECTED Final   Yersinia enterocolitica NOT DETECTED NOT DETECTED Final   Vibrio species NOT DETECTED NOT DETECTED Final   Vibrio cholerae NOT DETECTED NOT DETECTED Final   Enteroaggregative E coli (EAEC) NOT DETECTED NOT DETECTED Final   Enteropathogenic E coli (EPEC) NOT DETECTED NOT DETECTED Final   Enterotoxigenic E coli (ETEC) NOT DETECTED NOT DETECTED Final   Shiga like toxin producing E coli (STEC) NOT DETECTED NOT DETECTED Final   E. coli O157 NOT DETECTED NOT DETECTED Final   Shigella/Enteroinvasive E coli (EIEC) NOT DETECTED NOT DETECTED Final   Cryptosporidium NOT DETECTED NOT DETECTED Final   Cyclospora cayetanensis NOT DETECTED NOT DETECTED Final   Entamoeba histolytica NOT DETECTED NOT DETECTED Final   Giardia lamblia NOT DETECTED NOT DETECTED Final   Adenovirus F40/41 NOT DETECTED NOT DETECTED Final   Astrovirus NOT DETECTED NOT DETECTED Final   Norovirus GI/GII NOT DETECTED NOT DETECTED Final   Rotavirus A NOT DETECTED NOT DETECTED Final   Sapovirus (I, II, IV, and V) NOT DETECTED NOT DETECTED Final  Surgical pcr screen     Status: None   Collection Time: 01/26/2016 11:27 PM  Result Value Ref  Range Status   MRSA, PCR NEGATIVE NEGATIVE Final   Staphylococcus aureus NEGATIVE NEGATIVE Final    Comment:        The Xpert SA Assay (FDA approved for NASAL specimens in patients over 50 years of age), is one component of a comprehensive surveillance program.  Test performance has been validated by [facility] for patients greater than or equal to 64 year old. It is not intended to diagnose infection nor to guide or monitor treatment.     Studies/Results: Dg Chest Port 1 View  01/13/2016  CLINICAL DATA:  Respiratory failure. EXAM: PORTABLE CHEST 1 VIEW COMPARISON:  Chest x-ray 01/13/2016 26-Jan-2016. Abdominal series 618 2017. FINDINGS: Mediastinum hilar structures are normal. Low lung volumes with bibasilar atelectasis and or infiltrates. Interim near complete clearing of left upper lobe infiltrate. Heart size stable. Small left pleural effusion. Progressive bowel distention. Abdominal series suggested for further evaluation. Interposition of the colon under the right hemidiaphragm noted. No acute osseous abnormality. IMPRESSION: 1. Low lung volumes with bibasilar atelectasis and/or infiltrates again noted. Interim near complete clearing of left upper lobe infiltrate. 2. Progressive bowel distention. Abdominal series suggested for further evaluation. Electronically Signed   By: Maisie Fus  Register   On: 01/13/2016 07:24    Assessment/Plan: Jennifer [name] is a 55 y.o. 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 Prior cxs with VRE and Klebsiella - currently cx pending.  On this admission seems to not have UTI with UA unimpressive but instead has recurrent C diff.  Recommendations Continue oral vanco for C diff. CXR and abd imaging suggests ileus. So would continue IV flagyl as well  Repeat abd xray  Ucx with VRE so started linezolid given fevers.  Poor prognosis Thank you very much for the consult. Will follow with you.  Dakotah Orrego  P   01/14/2016, 3:25 PM

## 2016-01-15 ENCOUNTER — Encounter: Payer: Self-pay | Admitting: Family Medicine

## 2016-01-15 ENCOUNTER — Inpatient Hospital Stay: Payer: BLUE CROSS/BLUE SHIELD

## 2016-01-15 LAB — BASIC METABOLIC PANEL
ANION GAP: 1 — AB (ref 5–15)
BUN: 11 mg/dL (ref 6–20)
CO2: 21 mmol/L — ABNORMAL LOW (ref 22–32)
Calcium: 7.7 mg/dL — ABNORMAL LOW (ref 8.9–10.3)
Chloride: 123 mmol/L — ABNORMAL HIGH (ref 101–111)
Creatinine, Ser: 0.49 mg/dL (ref 0.44–1.00)
GFR calc Af Amer: >60 mL/min (ref >60)
Glucose, Bld: 144 mg/dL — ABNORMAL HIGH (ref 65–99)
POTASSIUM: 3.6 mmol/L (ref 3.5–5.1)
SODIUM: 145 mmol/L (ref 135–145)

## 2016-01-15 LAB — CBC
HEMATOCRIT: 31 % — AB (ref 35.0–47.0)
Hemoglobin: 10.5 g/dL — ABNORMAL LOW (ref 12.0–16.0)
MCH: 31.1 pg (ref 26.0–34.0)
MCHC: 33.8 g/dL (ref 32.0–36.0)
MCV: 92 fL (ref 80.0–100.0)
Platelets: 240 10*3/uL (ref 150–440)
RBC: 3.37 MIL/uL — ABNORMAL LOW (ref 3.80–5.20)
RDW: 13.9 % (ref 11.5–14.5)
WBC: 23.3 10*3/uL — AB (ref 3.6–11.0)

## 2016-01-15 LAB — BLOOD GAS, ARTERIAL
ACID-BASE DEFICIT: 8.1 mmol/L — AB (ref 0.0–2.0)
Allens test (pass/fail): POSITIVE — AB
Bicarbonate: 19.9 mEq/L — ABNORMAL LOW (ref 21.0–28.0)
FIO2: 1
O2 SAT: 99.1 %
PATIENT TEMPERATURE: 37
PH ART: 7.2 — AB (ref 7.350–7.450)
pCO2 arterial: 51 mmHg — ABNORMAL HIGH (ref 32.0–48.0)
pO2, Arterial: 160 mmHg — ABNORMAL HIGH (ref 83.0–108.0)

## 2016-01-15 MED ORDER — ONDANSETRON HCL 4 MG/2ML IJ SOLN
4.0000 mg | Freq: Four times a day (QID) | INTRAMUSCULAR | Status: DC | PRN
  Administered 2016-01-15: 4 mg via INTRAVENOUS
  Filled 2016-01-15: qty 2

## 2016-01-15 MED ORDER — IPRATROPIUM-ALBUTEROL 0.5-2.5 (3) MG/3ML IN SOLN
3.0000 mL | Freq: Four times a day (QID) | RESPIRATORY_TRACT | Status: DC
  Administered 2016-01-15 – 2016-01-16 (×4): 3 mL via RESPIRATORY_TRACT
  Filled 2016-01-15 (×4): qty 3

## 2016-01-15 MED ORDER — IPRATROPIUM-ALBUTEROL 0.5-2.5 (3) MG/3ML IN SOLN
3.0000 mL | Freq: Once | RESPIRATORY_TRACT | Status: AC
  Administered 2016-01-15: 3 mL via RESPIRATORY_TRACT

## 2016-01-15 MED ORDER — MORPHINE SULFATE (PF) 2 MG/ML IV SOLN
2.0000 mg | INTRAVENOUS | Status: DC | PRN
  Administered 2016-01-15 – 2016-01-16 (×7): 2 mg via INTRAVENOUS
  Filled 2016-01-15 (×7): qty 1

## 2016-01-15 MED ORDER — LINEZOLID 600 MG/300ML IV SOLN
600.0000 mg | Freq: Two times a day (BID) | INTRAVENOUS | Status: DC
  Administered 2016-01-15 – 2016-01-16 (×2): 600 mg via INTRAVENOUS
  Filled 2016-01-15 (×4): qty 300

## 2016-01-15 MED ORDER — ENOXAPARIN SODIUM 30 MG/0.3ML ~~LOC~~ SOLN
30.0000 mg | SUBCUTANEOUS | Status: DC
  Administered 2016-01-15: 30 mg via SUBCUTANEOUS
  Filled 2016-01-15: qty 0.3

## 2016-01-15 MED ORDER — SODIUM CHLORIDE 0.9 % IV SOLN
500.0000 mg | Freq: Two times a day (BID) | INTRAVENOUS | Status: DC
  Administered 2016-01-15 – 2016-01-16 (×2): 500 mg via INTRAVENOUS
  Filled 2016-01-15 (×3): qty 5

## 2016-01-15 NOTE — Progress Notes (Signed)
ABGs are reviewed showing severe acidosis mostly combination of respiratory plus metabolic.    I discussed with her husband in detail about this and he would like to give a trial of BiPAP for tonight  I will transfer the patient to stepdown unit, discussed the case with ICU PA.

## 2016-01-15 NOTE — Consult Note (Signed)
PULMONARY / CRITICAL CARE MEDICINE   Name: Jennifer [name] MRN: [id number] DOB: Mar 07, 1961    ADMISSION DATE:  01/08/2016 CONSULTATION DATE:  01/15/16  REFERRING MD:  Elisabeth Pigeon  CHIEF COMPLAINT:  Increased shortness of breath.  HISTORY OF PRESENT ILLNESS:   Jennifer [name] is a 55 year old female with past medical history significant for ALS, depression, GERD, urinary retention, dysphagia, severely malnourished. Patient presented to ED on 6/17 with recurrent urinary  tract infection and was getting treated for C. Difficile. Patient has been septic secondary to VRE and C. difficile. Her overall condition has worsened since admission. On 6/21 the patient was found to be nonresponsive by the M.D. and was in respiratory distress with rapid respiratory rate and prolonged expiratory phase. Her ABG-7.2/51/160/19.9. Patient was initiated on nonrebreather mask. Patient is a DO NOT RESUSCITATE and would not like to be intubated but the spouse is willing to try BiPAP on her for today.  Therefore PCCM team is consulted for further management. PAST MEDICAL HISTORY :  She  has a past medical history of ALS (amyotrophic lateral sclerosis) (HCC); Depression; GERD (gastroesophageal reflux disease); Urinary retention; Dysphagia, oropharyngeal (11/28/2013); and CN (constipation) (11/28/2013).  PAST SURGICAL HISTORY: She  has past surgical history that includes Cesarean section and Nasal sinus surgery.  Allergies  Allergen Reactions  . Bee Venom Anaphylaxis  . Hydrocodone Other (See Comments)    Reaction:  Muscle stiffness   . Lipitor [Atorvastatin] Other (See Comments)    Reaction:  Muscle cramps   . Shellfish Allergy Anaphylaxis  . Oxycodone Other (See Comments)    Reaction:  Muscle stiffness   . Prednisone Other (See Comments)    Reaction:  Psychosis   . Vicodin [Hydrocodone-Acetaminophen] Other (See Comments)    Reaction:  Muscle stiffness   . Acyclovir And Related   . Oxycontin [Oxycodone Hcl] Other  (See Comments)    Reaction:  Muscle stiffness   . Peanut-Containing Drug Products Diarrhea    No current facility-administered medications on file prior to encounter.   Current Outpatient Prescriptions on File Prior to Encounter  Medication Sig  . buPROPion (WELLBUTRIN SR) 150 MG 12 hr tablet Take 300 mg by mouth 2 (two) times daily.  . cholecalciferol (VITAMIN D) 1000 UNITS tablet Take 1,000 Units by mouth daily.  . ciprofloxacin (CIPRO) 500 MG tablet Take 1 tablet (500 mg total) by mouth 2 (two) times daily.  [name] Dextromethorphan-Quinidine (NUEDEXTA) 20-10 MG CAPS Take 1 capsule by mouth 2 (two) times daily.   . diazepam (VALIUM) 10 MG tablet TAKE ONE TABLET BY MOUTH EVERY 6 HOURS AS NEEDED  . ibuprofen (ADVIL,MOTRIN) 200 MG tablet Take 800 mg by mouth 3 (three) times daily as needed for mild pain.   [name] levETIRAcetam (KEPPRA) 500 MG tablet Take 500 mg by mouth 2 (two) times daily.   . potassium chloride 20 MEQ/15ML (10%) SOLN Take 20 mEq by mouth daily.  . ranitidine (ZANTAC) 150 MG tablet Take 150 mg by mouth daily as needed for heartburn.  . tizanidine (ZANAFLEX) 2 MG capsule Take 2 mg by mouth 3 (three) times daily.     FAMILY HISTORY:  Her indicated that her mother is deceased. She indicated that her father is alive. She indicated that one of her two sisters is deceased.   SOCIAL HISTORY: She  reports that she quit smoking about 8 years ago. She has never used smokeless tobacco. She reports that she does not drink alcohol or use illicit drugs.  REVIEW OF SYSTEMS:  Unable to obtain due to patient's mental status SUBJECTIVE:  Unable to obtain  VITAL SIGNS: BP 124/84 mmHg  Pulse 88  Temp(Src) 98.2 F (36.8 C) (Axillary)  Resp 26  Ht 5\' 4"  (1.626 m)  Wt 82 lb 3.7 oz (37.3 kg)  BMI 14.11 kg/m2  SpO2 97%  HEMODYNAMICS:    VENTILATOR SETTINGS:    INTAKE / OUTPUT: I/O last 3 completed shifts: In: 1755 [P.O.:120; I.V.:1435; IV Piggyback:200] Out: 1500 [Urine:750;  Stool:750]  PHYSICAL EXAMINATION: General: Severely  Cachectic, white female, very malnourished Neuro: Opens eyes barely when prompted repeatedly, does not follow any commands HEENT:  atraumatic, normocephalic, no discharge noted Cardiovascular :S1 and S2, regular rate and rhythm, no murmur rub or gallop noted Lungs:  faint wheezing, no rhonchi or crackles noted Abdomen:  tympany, no bowel sounds, distended, rectal tube in place Musculoskeletal:  deformed at baseline, no cyanosis, pedal edema noted Skin:  frail, no obvious lesion or ulcers noted  LABS:  BMET  Recent Labs Lab 01/13/16 0314 01/14/16 0315 01/15/16 0452  NA 139 142 145  K 3.0* 3.1* 3.6  CL 117* 120* 123*  CO2 15* 20* 21*  BUN 29* 17 11  CREATININE 0.79 0.55 0.49  GLUCOSE 77 106* 144*    Electrolytes  Recent Labs Lab 01/13/16 0314 01/14/16 0315 01/15/16 0452  CALCIUM 7.9* 7.7* 7.7*  MG 1.9  --   --     CBC  Recent Labs Lab 01/13/16 0314 01/14/16 0315 01/15/16 0452  WBC 17.8* 19.6* 23.3*  HGB 12.0 11.5* 10.5*  HCT 35.7 33.8* 31.0*  PLT 242 235 240    Coag's  Recent Labs Lab 04-17-2016 1909  APTT 36  INR 1.16    Sepsis Markers  Recent Labs Lab 04-17-2016 1909 04-17-2016 2308  LATICACIDVEN 1.9 2.0    ABG  Recent Labs Lab 01/15/16 1646  PHART 7.20*  PCO2ART 51*  PO2ART 160*    Liver Enzymes  Recent Labs Lab 04-17-2016 1909  AST 39  ALT 43  ALKPHOS 58  BILITOT 1.0  ALBUMIN 2.5*    Cardiac Enzymes  Recent Labs Lab 04-17-2016 1909  TROPONINI 0.16*    Glucose  Recent Labs Lab 04-17-2016 2250  GLUCAP 88    Imaging Dg Chest 1 View  01/15/2016  CLINICAL DATA:  Tachypnea with labored breathing. Crackles and expiratory wheezing. ALS. EXAM: CHEST 1 VIEW COMPARISON:  01/13/2016 and 01/07/2016. Abdominal radiographs 01/14/2016. FINDINGS: 1555 hours. Progressively lower lung volumes with increasing bibasilar airspace opacities. The heart size and mediastinal contours are  stable. No pneumothorax or significant pleural effusion identified. There is grossly stable gastric and colonic distention. No acute osseous findings are seen. IMPRESSION: Progressively lower lung volumes with increasing bibasilar atelectasis or infiltrates. Consider aspiration. Colonic and gastric distention again noted. Electronically Signed   By: Carey BullocksWilliam  Veazey M.D.   On: 01/15/2016 16:29     STUDIES:  6/20 x-ray abdomen>>There is moderate gaseous distension of the stomach. Persistentgaseous distension of the colon. Less distension of the rectum. Mildgaseous distended small bowel loops with slight improvement fromprior exam. Again findings suspicious for diffuse ileus.   CULTURES: 6/17 urine culture>> positive for VRE, Klebsiella pneumoniae 6/17 blood culture NGTD  6/17 gastrointestinal panel by PCR > not detected  6/17 C. difficile >> positive    ANTIBIOTICS: 6/20 Zyvox>> 6/18 Flagyl>> 6/18 vancomycin >>  SIGNIFICANT EVENTS: 6/21 patient was transferred from the floor to the ICU due to respiratory distress requiring BiPAP  LINES/TUBE none  DISCUSSION: 55 year old female  with history of ALS, depression, GERD, presenting with sepsis secondary to VRE in the urine and C. difficile now in respiratory distress requiring BiPAP. Patient is a DO NOT RESUSCITATE and do not want any aggressive measures.  ASSESSMENT / PLAN:  PULMONARY A: Acute hypoxemic, hypercarbic respiratory failure secondary to sepsis Tobacco abuse  Dysphagia  Severe protein calorie malnutrition  Increased risk of aspiration due to ALS and dysphagia  P:  NPO NOW  Continue to support oxygen to keep sats > 88% Bronchodilators Duoneb Morphine when necessary  DNR status  CARDIOVASCULAR  Hyperlipidemia  P:  Continuous telemetry Rest per primary  RENAL A:   Hypernatremia Sepsis related to Urinary tract infection  Urinary retention  P:   Follow chemistry Zyvox for VRE in urine Monitor I/O Replace  electrolytes per unit protocol. GASTROINTESTINAL A:   Entritis due to C. Difficile Abdominal pain related to CHF P:   nothing by mouth Pepcid for GI prophylaxis Vancomycin/Flagyl for C. difficile  HEMATOLOGIC A:   No active issues P:  SCDs Lovenox for DVT prophylaxis Transfuse if Hgb <7  INFECTIOUS A:   Septic secondary to VRE and C. 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   Bincy Varughese,AG-ACNP Pulmonary and Critical Care Medicine [facility]   01/15/2016, 5:08 PM  STAFF NOTE: I, Dr. Lucie Leather,  have personally reviewed patient's available data, including medical history, events of note, physical examination and test results as part of my evaluation. I have discussed with NP and other care providers such as pharmacist, RN and RRT.  In addition,      The Patient requires high complexity decision making for assessment and support, frequent evaluation and titration of therapies, application of advanced monitoring technologies and extensive interpretation of multiple databases.   This Critical care time does not reflrect procedure time or supervisory time of NP but could involve care discussion time Overall, patient is critically ill, prognosis is guarded.  Patient with Multiorgan failure and at high risk for cardiac arrest and death.    Lucie Leather, M.D.  [facility]  Medical Director [facility] Medical  Director [facility] Cardio-Pulmonary Department

## 2016-01-15 NOTE — Care Management (Signed)
Met with patient's husband today. He is not ready for hospice or palliative at this point as he is wanting to give patient a chance to get better. If she cannot improve he agrees to at least palliative in the home. I shared hospice agency list with him for review also. He was appreciative. RNCM will continue to follow.

## 2016-01-15 NOTE — Progress Notes (Signed)
Pt vomited per patient caretaker. A small amt of clear liquid noted on the her towel. Caretaker stated she vomited again but the towel was in laundry. I could not tell how much she actually vomited. The patient did spit out some fluid out of her mouth while I was in the room which was orange in color which is what she had for lunch. I asked the caretaker to stop feeding her. I called Dr Allena KatzPatel orders for Zofran placed and place an ng tube should she vomit again. Will continue to monitor.

## 2016-01-15 NOTE — Progress Notes (Addendum)
Speech Therapy Note: reviewed chart notes. Pt may have vomited this PM; NSG monitoring closely. Noted Palliative Care and Hospice services have been addressed w/ husband. Pt has been taking some of the recommended oral diet of a Dysphagia 1 w/ nectar consistency liquids w/ strict aspiration precautions. At any time pt appears to have (swallowing) difficulty tolerating the oral diet recommended, po's should be stopped and situation discussed w/ primary MD and pt/husband as pt will always be at increased risk for aspiration sec. to her dx of ALS and dysphagia baseline. ST services will be available for any further education if indicated while admitted.

## 2016-01-15 NOTE — Progress Notes (Signed)
[facility] - [place] [facility]   PATIENT NAME: Jennifer [name]    MR#:  [id number]  DATE OF BIRTH:  Nov 28, 1960  is at the beds Chief Complaint  Patient presents with  . Urinary Tract Infection  . Code Sepsis  Husband at bedside patient wants to eat more. WBC continues to remain high   REVIEW OF SYSTEMS:   Review of Systems  Unable to perform Due to chronic base line    DRUG ALLERGIES:   Allergies  Allergen Reactions  . Bee Venom Anaphylaxis  . Hydrocodone Other (See Comments)    Reaction:  Muscle stiffness   . Lipitor [Atorvastatin] Other (See Comments)    Reaction:  Muscle cramps   . Shellfish Allergy Anaphylaxis  . Oxycodone Other (See Comments)    Reaction:  Muscle stiffness   . Prednisone Other (See Comments)    Reaction:  Psychosis   . Vicodin [Hydrocodone-Acetaminophen] Other (See Comments)    Reaction:  Muscle stiffness   . Acyclovir And Related   . Oxycontin [Oxycodone Hcl] Other (See Comments)    Reaction:  Muscle stiffness   . Peanut-Containing Drug Products Diarrhea    VITALS:  Blood pressure 127/62, pulse 97, temperature 98.9 F (37.2 C), temperature source Oral, resp. rate 16, height  (1.626 m), weight 37.3 kg (82 lb 3.7 oz), SpO2 97 %.  PHYSICAL EXAMINATION:  GENERAL:  55 y.o.-year-old patient lying in the bed with no acute distress. CACHECTIC,Patient is ill-looking.  EYES: Pupils equal, round, reactive to light and accommodation. No scleral icterus. Extraocular muscles intact.  HEENT: Head atraumatic, normocephalic. Oropharynx and nasopharynx clear.  NECK:  Supple, no jugular venous distention. No thyroid enlargement, no tenderness.  LUNGS: Normal breath sounds bilaterally, no wheezing, rales,rhonchi or crepitation. No use of accessory muscles of respiration. DIMINISHED AT BASES, CARDIOVASCULAR: S1, S2 normal. No murmurs, rubs, or gallops.  ABDOMEN: Soft, Epigastric tenderness distended. Bowel sounds present. No  organomegaly or mass.  EXTREMITIES: No pedal edema, cyanosis, or clubbing.  Neurological: Awake, follows some basic commands,  Psych awake   SKIN: No obvious rash, lesion, or ulcer.    LABORATORY PANEL:   CBC  Recent Labs Lab 01/15/16 0452  WBC 23.3*  HGB 10.5*  HCT 31.0*  PLT 240   ------------------------------------------------------------------------------------------------------------------  Chemistries   Recent Labs Lab 01-20-2016 1909  01/13/16 0314  01/15/16 0452  NA 131*  < > 139  < > 145  K 5.1  < > 3.0*  < > 3.6  CL 103  < > 117*  < > 123*  CO2 14*  < > 15*  < > 21*  GLUCOSE 81  < > 77  < > 144*  BUN 51*  < > 29*  < > 11  CREATININE 2.71*  < > 0.79  < > 0.49  CALCIUM 8.0*  < > 7.9*  < > 7.7*  MG  --   --  1.9  --   --   AST 39  --   --   --   --   ALT 43  --   --   --   --   ALKPHOS 58  --   --   --   --   BILITOT 1.0  --   --   --   --   < > = values in this interval not displayed. ------------------------------------------------------------------------------------------------------------------  Cardiac Enzymes  Recent Labs Lab Jan 20, 2016 1909  TROPONINI 0.16*   ------------------------------------------------------------------------------------------------------------------  RADIOLOGY:  Dg Abd 1 View  01/14/2016  CLINICAL DATA:  Abdominal distension EXAM: ABDOMEN - 1 VIEW COMPARISON:  01/12/2016 FINDINGS: Moderate gaseous distension of the stomach. Again noted gaseous distension of the colon without significant change from prior exam. Less distension of the rectum. Again noted a rectal tube. Mild gaseous distended small bowel loops with slight improvement from prior exam. IMPRESSION: There is moderate gaseous distension of the stomach. Persistent gaseous distension of the colon. Less distension of the rectum. Mild gaseous distended small bowel loops with slight improvement from prior exam. Again findings suspicious for diffuse ileus. Electronically  Signed   By: Natasha MeadLiviu  Pop M.D.   On: 01/14/2016 16:30    EKG:   Orders placed or performed during the hospital encounter of 01/10/2016  . EKG 12-Lead  . EKG 12-Lead    ASSESSMENT AND PLAN:  Patient's a 55 year old with diagnosis of ALS # 1. septic shock: due to c.diff and VRE uti WBC continues to be elevated appreciate ID input continue current therapy with linezolid Flagyl and oral vancomycin On examination abdominal distention looks worse however x-ray looks better #2 acute renal failure due to ATN from sepsis is always IV hydration resolved #3 history of amyotrophic lateral sclerosis: Has chronic Foley catheter. Prognosis poor  #5 GERD continue PPIs #6 mild hypokalemia normal now #7. elevated troponins likely due to sepsis:   All the records are reviewed and case discussed with Care Management/Social Workerr. Management plans discussed with the patient, family and they are in agreement.   D/w husband states he wants to continue antibiotic CODE STATUS:dnr  TOTAL TIME TAKING CARE OF THIS PATIENT: 32min  POSSIBLE D/C IN 5-6 DAYS, DEPENDING ON CLINICAL CONDITION.   Auburn BilberryPATEL, Keeara Frees M.D on 01/15/2016 at 11:59 AM  Between 7am to 6pm - Pager - [phone]  After 6pm go to [email or web address] - password [email or web address]  Fabio Neighbors[facility]  Office  [phone]  CC: Primary care physician; Ruthe Mannanalia Aron, MD   Note: This dictation was prepared with Dragon dictation along with smaller phrase technology. Any transcriptional errors that result from this process are unintentional.

## 2016-01-15 NOTE — Progress Notes (Signed)
Patients respirations have increased and and are more labored. Crackles and expiratory wheezes heard this am she was diminished. MD called orders received.

## 2016-01-15 NOTE — Progress Notes (Signed)
I was called by nurse to evaluate the patient is she was short of breath.  I reviewed the chart patient is having ALS and being treated for sepsis secondary to VRE and C  Diff.  They are also concerned about her nutrition and ability to swallow the food , and palliative care consult was called in.   She is also being treated for colonic distention.    Examination:    The patient is not responsive to stimuli, appears in acute respiratory distress with rapid respiratory rate and prolonged expiratory phase. Currently using nonrebreather mask.   On auscultation she has wheezing and prolonged expiration.   Abdomen is distended and tight,  I could not hear any bowel sounds.   Rectal tube in place.   Patient appears severely malnourished.  * Lab results   I reviewed her authorizing the white blood cell count gradually for last few days.   Chest x-ray today and abdominal x-ray today reviewed.- Shows persistent distention off in this time with some infiltrates in the lung with low lung volumes.  * Assessment and plan   - Acute respiratory distress with hypoxia   Possible due to aspiration and reactive airways.   Currently on nonrebreather mask, SPO2 more than 90%.   I spoke to patient's husband was present in the room in detail about this condition and the available options including full aggressive care and she requires intubation and ventilatory support or withdrawing all the care and keeping her on the comfort measures only with morphine to support her respiratory status, and keep him comfort measures.   He appears understanding patient's prognosis is not very good even though we do aggressive treatment, and so chose to stick with DO NOT RESUSCITATE- he wanted to continue antibiotic therapy to give a trial off treating infection.   He also wanted to continue to high oxygen support with nonrebreather mask, and give morphine to keep her comfortable.    I will check ABG for now on husband's  request, and give patient DuoNeb therapy to help with airways.    Patient is very critical at this point due to acute respiratory failure, but because husband chose to treat her conservatively we would continue monitoring on the regular MedSurg floor.    Critical care time spent 40 minutes.

## 2016-01-15 NOTE — Progress Notes (Signed)
eLink Physician-Brief Progress Note Patient Name: Jennifer RumpleKatherine [name] DOB: June 05, 1961 MRN: [id number]   Date of Service  01/15/2016  HPI/Events of Note  Nurse found small amount of emesis in BiPAP mask. Patient now on NRBM. Patient is DNR/DNI with a PMH of ALS. Family refused NGT earlier. Patient is currently on Zyvox IV,  Flagyl IV and Vancomycin PO.  eICU Interventions  Will order: 1. Portable CXR in AM. 2. Continue present management.   If she fatigues, will need to go back on BiPAP.     Intervention Category Intermediate Interventions: Other:  Lenell AntuSommer,Kymani Laursen Eugene 01/15/2016, 9:58 PM

## 2016-01-15 NOTE — Progress Notes (Addendum)
Respiratory in the room treatment given. Dr Elisabeth PigeonVachhani in the room as well seeing patient for Dr Allena KatzPatel. ABG ordered.Dr Elisabeth Pigeonvachhani spoke with patients husband regarding comfort care.sats 91% non- rebreather Patient to be transferred to ICU to be put on by-pap awaiting bed.

## 2016-01-15 NOTE — Progress Notes (Signed)
[facility] CLINIC INFECTIOUS DISEASE PROGRESS NOTE Date of Admission:  27-Jan-2016     ID: Jennifer [name] is a 55 y.o. female with ALS, recurrent UTI, chronic cath, C diff  Active Problems:   Sepsis (HCC)   C. difficile colitis   Abdominal pain   Subjective: Xray showed continued ileus, fevers improving. Moaning in pain   ROS unable to obtain   Medications:  Antibiotics Given (last 72 hours)    Date/Time Action Medication Dose Rate   01/12/16 1448 Given   piperacillin-tazobactam (ZOSYN) IVPB 3.375 g 3.375 g 12.5 mL/hr   01/12/16 1718 Given   metroNIDAZOLE (FLAGYL) IVPB 500 mg 500 mg 100 mL/hr   01/12/16 2233 Given   piperacillin-tazobactam (ZOSYN) IVPB 3.375 g 3.375 g 12.5 mL/hr   01/13/16 0111 Given   metroNIDAZOLE (FLAGYL) IVPB 500 mg 500 mg 100 mL/hr   01/13/16 0526 Given   piperacillin-tazobactam (ZOSYN) IVPB 3.375 g 3.375 g 12.5 mL/hr   01/13/16 1122 Given  [had to request from pharmacy]   metroNIDAZOLE (FLAGYL) IVPB 500 mg 500 mg 100 mL/hr   01/13/16 1122 Given   vancomycin (VANCOCIN) 50 mg/mL oral solution 125 mg 125 mg    01/13/16 1646 Given   metroNIDAZOLE (FLAGYL) IVPB 500 mg 500 mg 100 mL/hr   01/13/16 2007 Given   vancomycin (VANCOCIN) 50 mg/mL oral solution 125 mg 125 mg    01/14/16 0104 Given   metroNIDAZOLE (FLAGYL) IVPB 500 mg 500 mg 100 mL/hr   01/14/16 0104 Given   vancomycin (VANCOCIN) 50 mg/mL oral solution 125 mg 125 mg    01/14/16 0957 Given   vancomycin (VANCOCIN) 50 mg/mL oral solution 125 mg 125 mg    01/14/16 1026 Given   metroNIDAZOLE (FLAGYL) IVPB 500 mg 500 mg 100 mL/hr   01/14/16 1319 Given   vancomycin (VANCOCIN) 50 mg/mL oral solution 125 mg 125 mg    01/14/16 1530 Given   linezolid (ZYVOX) tablet 600 mg 600 mg    01/14/16 1652 Given   metroNIDAZOLE (FLAGYL) IVPB 500 mg 500 mg 100 mL/hr   01/14/16 1926 Given   vancomycin (VANCOCIN) 50 mg/mL oral solution 125 mg 125 mg    01/14/16 2132 Given   linezolid (ZYVOX) tablet 600 mg 600 mg     01/14/16 2304 Given   metroNIDAZOLE (FLAGYL) IVPB 500 mg 500 mg 100 mL/hr   01/15/16 0108 Given   vancomycin (VANCOCIN) 50 mg/mL oral solution 125 mg 125 mg    01/15/16 0746 Given   metroNIDAZOLE (FLAGYL) IVPB 500 mg 500 mg 100 mL/hr   01/15/16 0746 Given   vancomycin (VANCOCIN) 50 mg/mL oral solution 125 mg 125 mg    01/15/16 0925 Given   linezolid (ZYVOX) tablet 600 mg 600 mg      . acetaminophen  650 mg Oral Q12H  . buPROPion  150 mg Oral QID  . Dextromethorphan-Quinidine  1 capsule Oral BID  . enoxaparin (LOVENOX) injection  30 mg Subcutaneous Q24H  . famotidine  20 mg Oral Daily  . ibuprofen  400 mg Oral Q12H  . levETIRAcetam  500 mg Oral BID  . linezolid  600 mg Oral Q12H  . metronidazole  500 mg Intravenous Q8H  . tiZANidine  2 mg Oral QHS  . vancomycin  125 mg Oral Q6H    Objective: Vital signs in last 24 hours: Temp:  [98.9 F (37.2 C)-100 F (37.8 C)] 98.9 F (37.2 C) (06/21 0724) Pulse Rate:  [93-109] 97 (06/21 0724) Resp:  [16-20] 16 (06/21  0724) BP: (117-131)/(62-92) 127/62 mmHg (06/21 0724) SpO2:  [91 %-97 %] 97 % (06/21 0724) GENERAL: curled on side. Thin, moaning in pain  EYES: Pupils equal, round, reactive to light and accommodation. No scleral icterus.  HEENT: Head atraumatic, normocephalic. No oropharyngeal erythema, moist oral dry NECK: Supple, no jugular venous distention. No thyroid enlargement, no tenderness.  LUNGS: Normal breath sounds bilaterally, no wheezing, rales, rhonchi. No use of accessory muscles of respiration.  CARDIOVASCULAR: S1, S2 normal.  ABDOMEN: Soft, distended, seems tender.   EXTREMITIES: No pedal edema, cyanosis, or clubbing. + NEUROLOGIC: Slumped to the right side. Does not follow commands. PSYCHIATRIC: The patient is drowsy. SKIN: No obvious rash, lesion, or ulcer. Foley in place with very dark concentrated urine Rectal tube with loose brown stool   Lab Results  Recent Labs  01/14/16 0315 01/15/16 0452  WBC  19.6* 23.3*  HGB 11.5* 10.5*  HCT 33.8* 31.0*  NA 142 145  K 3.1* 3.6  CL 120* 123*  CO2 20* 21*  BUN 17 11  CREATININE 0.55 0.49    Microbiology: Results for orders placed or performed during the hospital encounter of 02/04/16  Blood Culture (routine x 2)     Status: None (Preliminary result)   Collection Time: 2016/02/04  7:09 PM  Result Value Ref Range Status   Specimen Description BLOOD RIGHT FOREARM  Final   Special Requests BOTTLES DRAWN AEROBIC AND ANAEROBIC 1 CC  Final   Culture NO GROWTH 3 DAYS  Final   Report Status PENDING  Incomplete  Blood Culture (routine x 2)     Status: None (Preliminary result)   Collection Time: 02/04/2016  7:14 PM  Result Value Ref Range Status   Specimen Description BLOOD LEFT ARM  Final   Special Requests BOTTLES DRAWN AEROBIC AND ANAEROBIC 1 CC  Final   Culture NO GROWTH 3 DAYS  Final   Report Status PENDING  Incomplete  Urine culture     Status: Abnormal   Collection Time: 04-Feb-2016  7:20 PM  Result Value Ref Range Status   Specimen Description URINE, RANDOM  Final   Special Requests NONE  Final   Culture (A)  Final    >=100,000 COLONIES/mL VANCOMYCIN RESISTANT ENTEROCOCCUS 30,000 COLONIES/mL KLEBSIELLA PNEUMONIAE    Report Status 01/14/2016 FINAL  Final   Organism ID, Bacteria VANCOMYCIN RESISTANT ENTEROCOCCUS (A)  Final   Organism ID, Bacteria KLEBSIELLA PNEUMONIAE (A)  Final      Susceptibility   Klebsiella pneumoniae - MIC*    AMPICILLIN 16 RESISTANT Resistant     CEFAZOLIN <=4 SENSITIVE Sensitive     CEFTRIAXONE <=1 SENSITIVE Sensitive     CIPROFLOXACIN 0.5 SENSITIVE Sensitive     GENTAMICIN <=1 SENSITIVE Sensitive     IMIPENEM <=0.25 SENSITIVE Sensitive     NITROFURANTOIN <=16 SENSITIVE Sensitive     TRIMETH/SULFA <=20 SENSITIVE Sensitive     AMPICILLIN/SULBACTAM 4 SENSITIVE Sensitive     PIP/TAZO <=4 SENSITIVE Sensitive     Extended ESBL NEGATIVE Sensitive     * 30,000 COLONIES/mL KLEBSIELLA PNEUMONIAE   Vancomycin  resistant enterococcus - MIC*    AMPICILLIN >=32 RESISTANT Resistant     LEVOFLOXACIN >=8 RESISTANT Resistant     NITROFURANTOIN 128 RESISTANT Resistant     VANCOMYCIN >=32 RESISTANT Resistant     LINEZOLID 2 SENSITIVE Sensitive     * >=100,000 COLONIES/mL VANCOMYCIN RESISTANT ENTEROCOCCUS  C difficile quick scan w PCR reflex     Status: Abnormal   Collection Time: February 04, 2016  11:14 PM  Result Value Ref Range Status   C Diff antigen POSITIVE (A) NEGATIVE Final   C Diff toxin POSITIVE (A) NEGATIVE Final   C Diff interpretation   Final    Positive for toxigenic C. difficile, active toxin production present.    Comment: CALLED BRITTONLEE RUSTCHESTER AT 0010 ON 01/12/16 [facility]  Gastrointestinal Panel by PCR , Stool     Status: None   Collection Time: 01/24/2016 11:14 PM  Result Value Ref Range Status   Campylobacter species NOT DETECTED NOT DETECTED Final   Plesimonas shigelloides NOT DETECTED NOT DETECTED Final   Salmonella species NOT DETECTED NOT DETECTED Final   Yersinia enterocolitica NOT DETECTED NOT DETECTED Final   Vibrio species NOT DETECTED NOT DETECTED Final   Vibrio cholerae NOT DETECTED NOT DETECTED Final   Enteroaggregative E coli (EAEC) NOT DETECTED NOT DETECTED Final   Enteropathogenic E coli (EPEC) NOT DETECTED NOT DETECTED Final   Enterotoxigenic E coli (ETEC) NOT DETECTED NOT DETECTED Final   Shiga like toxin producing E coli (STEC) NOT DETECTED NOT DETECTED Final   E. coli O157 NOT DETECTED NOT DETECTED Final   Shigella/Enteroinvasive E coli (EIEC) NOT DETECTED NOT DETECTED Final   Cryptosporidium NOT DETECTED NOT DETECTED Final   Cyclospora cayetanensis NOT DETECTED NOT DETECTED Final   Entamoeba histolytica NOT DETECTED NOT DETECTED Final   Giardia lamblia NOT DETECTED NOT DETECTED Final   Adenovirus F40/41 NOT DETECTED NOT DETECTED Final   Astrovirus NOT DETECTED NOT DETECTED Final   Norovirus GI/GII NOT DETECTED NOT DETECTED Final   Rotavirus A NOT DETECTED NOT  DETECTED Final   Sapovirus (I, II, IV, and V) NOT DETECTED NOT DETECTED Final  Surgical pcr screen     Status: None   Collection Time: 01/07/2016 11:27 PM  Result Value Ref Range Status   MRSA, PCR NEGATIVE NEGATIVE Final   Staphylococcus aureus NEGATIVE NEGATIVE Final    Comment:        The Xpert SA Assay (FDA approved for NASAL specimens in patients over 55 years of age), is one component of a comprehensive surveillance program.  Test performance has been validated by [facility] for patients greater than or equal to 55 year old. It is not intended to diagnose infection nor to guide or monitor treatment.     Studies/Results: Dg Abd 1 View  01/14/2016  CLINICAL DATA:  Abdominal distension EXAM: ABDOMEN - 1 VIEW COMPARISON:  01/12/2016 FINDINGS: Moderate gaseous distension of the stomach. Again noted gaseous distension of the colon without significant change from prior exam. Less distension of the rectum. Again noted a rectal tube. Mild gaseous distended small bowel loops with slight improvement from prior exam. IMPRESSION: There is moderate gaseous distension of the stomach. Persistent gaseous distension of the colon. Less distension of the rectum. Mild gaseous distended small bowel loops with slight improvement from prior exam. Again findings suspicious for diffuse ileus. Electronically Signed   By: Natasha MeadLiviu  Pop M.D.   On: 01/14/2016 16:30    Assessment/Plan: Sula RumpleKatherine Carvey is a 55 y.o. 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 Prior cxs with VRE and Klebsiella - currently cx with same.  On this admission seems to not have UTI with UA unimpressive but instead has recurrent C diff.    Recommendations Continue oral vanco for C diff. CXR and abd imaging suggests continued  ileus. So would continue IV flagyl as well  Ucx with VRE so started linezolid given fevers.  Poor prognosis Thank you very much for the consult. Will follow with  you.  Tanyla Stege P   01/15/2016, 1:53 PM

## 2016-01-16 ENCOUNTER — Inpatient Hospital Stay: Payer: BLUE CROSS/BLUE SHIELD

## 2016-01-16 DIAGNOSIS — J9601 Acute respiratory failure with hypoxia: Secondary | ICD-10-CM

## 2016-01-16 LAB — BLOOD GAS, ARTERIAL
ACID-BASE DEFICIT: 4.9 mmol/L — AB (ref 0.0–2.0)
Allens test (pass/fail): POSITIVE — AB
BICARBONATE: 21.1 meq/L (ref 21.0–28.0)
FIO2: 0.36
O2 SAT: 90.6 %
Patient temperature: 37
pCO2 arterial: 42 mmHg (ref 32.0–48.0)
pH, Arterial: 7.31 — ABNORMAL LOW (ref 7.350–7.450)
pO2, Arterial: 66 mmHg — ABNORMAL LOW (ref 83.0–108.0)

## 2016-01-16 LAB — BASIC METABOLIC PANEL
Anion gap: 4 — ABNORMAL LOW (ref 5–15)
BUN: 9 mg/dL (ref 6–20)
CALCIUM: 7.7 mg/dL — AB (ref 8.9–10.3)
CO2: 21 mmol/L — ABNORMAL LOW (ref 22–32)
CREATININE: 0.4 mg/dL — AB (ref 0.44–1.00)
Chloride: 125 mmol/L — ABNORMAL HIGH (ref 101–111)
GFR calc Af Amer: >60 mL/min (ref >60)
GLUCOSE: 126 mg/dL — AB (ref 65–99)
POTASSIUM: 4 mmol/L (ref 3.5–5.1)
SODIUM: 150 mmol/L — AB (ref 135–145)

## 2016-01-16 LAB — CULTURE, BLOOD (ROUTINE X 2)
Culture: NO GROWTH
Culture: NO GROWTH

## 2016-01-16 LAB — CBC
HCT: 33.6 % — ABNORMAL LOW (ref 35.0–47.0)
Hemoglobin: 11.3 g/dL — ABNORMAL LOW (ref 12.0–16.0)
MCH: 32 pg (ref 26.0–34.0)
MCHC: 33.7 g/dL (ref 32.0–36.0)
MCV: 95 fL (ref 80.0–100.0)
Platelets: 282 10*3/uL (ref 150–440)
RBC: 3.54 MIL/uL — AB (ref 3.80–5.20)
RDW: 14.2 % (ref 11.5–14.5)
WBC: 29.9 10*3/uL — ABNORMAL HIGH (ref 3.6–11.0)

## 2016-01-16 MED ORDER — MORPHINE 100MG IN NS 100ML (1MG/ML) PREMIX INFUSION
2.0000 mg/h | INTRAVENOUS | Status: DC
  Administered 2016-01-16: 2 mg/h via INTRAVENOUS
  Administered 2016-01-17: 07:00:00 6 mg/h via INTRAVENOUS
  Administered 2016-01-17: 19:00:00 8 mg/h via INTRAVENOUS
  Administered 2016-01-18 – 2016-01-19 (×5): 10 mg/h via INTRAVENOUS
  Administered 2016-01-20: 2 mg/h via INTRAVENOUS
  Administered 2016-01-20 – 2016-01-21 (×5): 10 mg/h via INTRAVENOUS
  Administered 2016-01-21: 2 mg/h via INTRAVENOUS
  Administered 2016-01-22: 10 mg/h via INTRAVENOUS
  Administered 2016-01-22: 16:00:00 2 mg/h via INTRAVENOUS
  Filled 2016-01-16 (×15): qty 100

## 2016-01-16 MED ORDER — DEXTROSE 5 % IV SOLN
INTRAVENOUS | Status: DC
  Administered 2016-01-16: 10:00:00 via INTRAVENOUS

## 2016-01-16 MED ORDER — DEXTROSE-NACL 5-0.9 % IV SOLN
INTRAVENOUS | Status: DC
  Administered 2016-01-16 – 2016-01-18 (×2): via INTRAVENOUS

## 2016-01-16 MED ORDER — DIAZEPAM 5 MG/ML IJ SOLN: 5.0000 mg | INTRAMUSCULAR | Status: DC | PRN

## 2016-01-16 MED ORDER — MORPHINE SULFATE (PF) 2 MG/ML IV SOLN
2.0000 mg | INTRAVENOUS | Status: DC | PRN
  Administered 2016-01-16 – 2016-01-23 (×6): 2 mg via INTRAVENOUS
  Filled 2016-01-16 (×6): qty 1

## 2016-01-16 MED ORDER — LORAZEPAM 2 MG/ML IJ SOLN
2.0000 mg | INTRAMUSCULAR | Status: DC | PRN
  Administered 2016-01-16 – 2016-01-23 (×9): 2 mg via INTRAVENOUS
  Filled 2016-01-16 (×11): qty 1

## 2016-01-16 MED ORDER — MORPHINE SULFATE (PF) 2 MG/ML IV SOLN
INTRAVENOUS | Status: AC
  Filled 2016-01-16: qty 1

## 2016-01-16 MED ORDER — GLYCOPYRROLATE 0.2 MG/ML IJ SOLN
0.4000 mg | Freq: Three times a day (TID) | INTRAMUSCULAR | Status: DC | PRN
  Administered 2016-01-17 – 2016-01-20 (×5): 0.4 mg via INTRAVENOUS
  Filled 2016-01-16 (×7): qty 2

## 2016-01-16 MED ORDER — DIAZEPAM 5 MG/ML IJ SOLN
5.0000 mg | INTRAMUSCULAR | Status: DC | PRN
  Administered 2016-01-16 – 2016-01-17 (×4): 5 mg via INTRAVENOUS
  Filled 2016-01-16 (×4): qty 2

## 2016-01-16 MED ORDER — SODIUM CHLORIDE 0.9 % IV SOLN: 2.0000 mg/h | INTRAVENOUS | Status: DC

## 2016-01-16 NOTE — Progress Notes (Signed)
Palliative Medicine Inpatient Consult Follow Up Note   Name: Jennifer [name] Date: 01/16/2016 MRN: [id number]  DOB: May 21, 1961  Referring Physician: Auburn BilberryShreyang Patel, MD  Palliative Care consult requested for this 55 y.o. female for goals of medical therapy in patient with end stage ALS with complications.   DISCUSSIONS AND PLANS:  Pt has now been very appropriately changed to comfort care after Dr Belia HemanKasa talked with the pt's husband.  I am working on adjusting orders a bit and transferring pt to 1C for comfort terminal care.  Thankfully, husband has now accepted that pt is dying and he is most focused now on her comfort.  Pts daughter is not here, but husband says she will be here soon and she knows that pt is actively dying.   I talked with the husband about Hospice Home as an option.  I don't think this pt is able to go back home for terminal care, since she needs IV injections and a titrated morphine drip for pain control. She really needs round the clock skilled nursing for the sake of pain and symptom management.  He is not crazy about Hospice Home, but says if she is lingering and stable for transport in 2-3 days, he would consider it.  My personal position is that this pt is not likely to remain stable given all the morphine, valium, and other meds that she needs, she is likely to have a drop in blood pressure and other vital signs soon.  So she will then be unstable--most likely soon.   Also, pt is in a specialty bed and cannot be changed from bed to stretcher without causing pain.  She has severe pain with care and movement and is now comfortable.  Plan is for pt to go to 1 C for terminal comfort care.  Will reassess daily, but [facility] b/c she is likely to be unstable soon and also b/c moving her (now) would cause severe pain and suffering. Husband agrees with this plan.   ACTIVE PROBLEMS: Septic Shock (initial BP was 66/40) --with leukocytosis --due  to C Diff colitis Acute Resp Failure --due to aspiration pneumonia Aspiration Pneumonia Acute UTI vs colonization ---with h/o recurrent UTIs ---now growing VRE >100K colonies and also 30K klebsiella pneumo -------previously had similar organisms with UTI in Feb and got Ertapenam, Dapto, and Fosfomycin. ---blood cxs negative from 6/17. ABX for UTI held per ID recs.  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 BMI is 14.11.  ---related to dysphagia and chronic illness with severe depletion of body fat and muscle ---with stage 3 right sacral pressure ulcer  ------present on admission  ------2 cm by 2 cm by 0.2 cm H/O acute resp failure with intubation in Feb 2017 Severe pain ---due to ALS muscle spasms  and abdominal pain from ileus  REVIEW OF SYSTEMS:  Patient is not able to provide ROS due to being sedated  CODE STATUS: DNR   PAST MEDICAL HISTORY: Past Medical History  Diagnosis Date  . ALS (amyotrophic lateral sclerosis) (HCC)   . Depression   . GERD (gastroesophageal reflux disease)   . Urinary retention   . Dysphagia, oropharyngeal 11/28/2013  . CN (constipation) 11/28/2013    PAST SURGICAL HISTORY:  Past Surgical History  Procedure  Laterality Date  . Cesarean section    . Nasal sinus surgery      Vital Signs: BP 107/80 mmHg  Pulse 96  Temp(Src) 97.6 F (36.4 C) (Axillary)  Resp 12  Ht 5\' 4"  (1.626 m)  Wt 37.3 kg (82 lb 3.7 oz)  BMI 14.11 kg/m2  SpO2 97% Filed Weights   2015-09-22 2005 2015-09-22 2310  Weight: 36.798 kg (81 lb 2 oz) 37.3 kg (82 lb 3.7 oz)    Estimated body mass index is 14.11 kg/(m^2) as calculated from the following:   Height as of this encounter: 5\' 4"  (1.626 m).   Weight as of this encounter: 37.3 kg (82 lb 3.7 oz).  PHYSICAL EXAM:  She is comfortable at the moment ---contorted body positioning noted and due to LS with contractures, foot drop etc Eyes are closed Lips are dry Neck w/o JVD or TM Hrt rrr no mg Lungs cta Abd soft and NT Ext no mottling or cyanosis as yet --very pale skin Contorted body positioning from ALS   LABS: CBC:    Component Value Date/Time   WBC 29.9* 01/16/2016 0543   WBC 9.8 10/15/2013 0545   HGB 11.3* 01/16/2016 0543   HGB 12.4 10/15/2013 0545   HCT 33.6* 01/16/2016 0543   HCT 35.7 10/15/2013 0545   PLT 282 01/16/2016 0543   PLT 213 10/15/2013 0545   MCV 95.0 01/16/2016 0543   MCV 94 10/15/2013 0545   NEUTROABS 10.9* 04/11/2016 1909   NEUTROABS 8.1* 10/15/2013 0545   LYMPHSABS 0.6* 04/11/2016 1909   LYMPHSABS 1.2 10/15/2013 0545   MONOABS 0.2 04/11/2016 1909   MONOABS 0.4 10/15/2013 0545   EOSABS 0.1 04/11/2016 1909   EOSABS 0.1 10/15/2013 0545   BASOSABS 0.0 04/11/2016 1909   BASOSABS 0.0 10/15/2013 0545   Comprehensive Metabolic Panel:    Component Value Date/Time   NA 150* 01/16/2016 0543   NA 140 10/15/2013 0545   K 4.0 01/16/2016 0543   K 3.5 10/15/2013 0545   CL 125* 01/16/2016 0543   CL 110* 10/15/2013 0545   CO2 21* 01/16/2016 0543   CO2 25 10/15/2013 0545   BUN 9 01/16/2016 0543   BUN 10 10/15/2013 0545   CREATININE 0.40* 01/16/2016 0543   CREATININE 0.63 10/15/2013 0545   GLUCOSE 126* 01/16/2016 0543   GLUCOSE 71  10/15/2013 0545   CALCIUM 7.7* 01/16/2016 0543   CALCIUM 8.7 10/15/2013 0545   AST 39 04/11/2016 1909   AST 30 10/14/2013 1716   ALT 43 04/11/2016 1909   ALT 20 10/14/2013 1716   ALKPHOS 58 04/11/2016 1909   ALKPHOS 62 10/14/2013 1716   BILITOT 1.0 04/11/2016 1909   BILITOT 0.3 10/14/2013 1716   PROT 5.1* 04/11/2016 1909   PROT 7.9 10/14/2013 1716   ALBUMIN 2.5* 04/11/2016 1909   ALBUMIN 4.1 10/14/2013 1716    More than 50% of the visit was spent in counseling/coordination of care: YES  Time Spent:  65 min

## 2016-01-16 NOTE — Progress Notes (Signed)
Spiritual ritual done by Chaplain as requested.

## 2016-01-16 NOTE — Progress Notes (Signed)
After assessing patient and her resp status, patient is struggling to breathe, patient with severe metabolic acidosis. The Husband understands that she is suffering and has suffered a lot. He understands her poor prognosis and very poor chance of meaningful recovery The husband has agreed to make her comfort care measures-I have explained that we will NOT proceed with NGT, will NOT proceed with lab draws. Will place PICC line if needed for IV access for pain meds  The husband has agreed to proceed with Comfort care measures. Will place orders. ICU nurses notified   Lucie LeatherKurian David Sonny Anthes, M.D.  [facility]  Medical Director [facility] Medical Director [facility]

## 2016-01-16 NOTE — Progress Notes (Signed)
[facility] [place] [facility]   PATIENT NAME: Jennifer [name]    MR#:  [id number]  DATE OF BIRTH:  25-Sep-1960  is at the beds Chief Complaint  Patient presents with  . Urinary Tract Infection  . Code Sepsis  Patient had to be transferred to the ICU due to respiratory failure She is currently on 4 L of oxygen WBC continues to be elevated at 29,000 She is moaning in pain Husband is at bedside  REVIEW OF SYSTEMS:   Review of Systems  Unable to perform Due to chronic base line    DRUG ALLERGIES:   Allergies  Allergen Reactions  . Bee Venom Anaphylaxis  . Hydrocodone Other (See Comments)    Reaction:  Muscle stiffness   . Lipitor [Atorvastatin] Other (See Comments)    Reaction:  Muscle cramps   . Shellfish Allergy Anaphylaxis  . Oxycodone Other (See Comments)    Reaction:  Muscle stiffness   . Prednisone Other (See Comments)    Reaction:  Psychosis   . Vicodin [Hydrocodone-Acetaminophen] Other (See Comments)    Reaction:  Muscle stiffness   . Acyclovir And Related   . Oxycontin [Oxycodone Hcl] Other (See Comments)    Reaction:  Muscle stiffness   . Peanut-Containing Drug Products Diarrhea    VITALS:  Blood pressure 107/80, pulse 96, temperature 97.6 F (36.4 C), temperature source Axillary, resp. rate 12, height 5\' 4"  (1.626 m), weight 37.3 kg (82 lb 3.7 oz), SpO2 97 %.  PHYSICAL EXAMINATION:  GENERAL:  55 y.o.-year-old patient lying in the bed with Critically ill-appearing  EYES: Pupils equal, round, reactive to light and accommodation. No scleral icterus.  HEENT: Head atraumatic, normocephalic. Oropharynx and nasopharynx clear.  NECK:  Supple, no jugular venous distention. No thyroid enlargement, no tenderness.  LUNGS:  Assess her muscle usage with rhonchus breath sounds CARDIOVASCULAR: S1, S2 normal. No murmurs, rubs, or gallops.  ABDOMEN:  Abdomen is distended and epigastric tenderness no bowel sounds EXTREMITIES: No pedal  edema, cyanosis, or clubbing.  Neurological: Awake, follows some basic commands,  Psych awake  SKIN: No obvious rash, lesion, or ulcer.    LABORATORY PANEL:   CBC  Recent Labs Lab 01/16/16 0543  WBC 29.9*  HGB 11.3*  HCT 33.6*  PLT 282   ------------------------------------------------------------------------------------------------------------------  Chemistries   Recent Labs Lab 12/29/2015 1909  01/13/16 0314  01/16/16 0543  NA 131*  < > 139  < > 150*  K 5.1  < > 3.0*  < > 4.0  CL 103  < > 117*  < > 125*  CO2 14*  < > 15*  < > 21*  GLUCOSE 81  < > 77  < > 126*  BUN 51*  < > 29*  < > 9  CREATININE 2.71*  < > 0.79  < > 0.40*  CALCIUM 8.0*  < > 7.9*  < > 7.7*  MG  --   --  1.9  --   --   AST 39  --   --   --   --   ALT 43  --   --   --   --   ALKPHOS 58  --   --   --   --   BILITOT 1.0  --   --   --   --   < > = values in this interval not displayed. ------------------------------------------------------------------------------------------------------------------  Cardiac Enzymes  Recent Labs Lab 01/18/2016 1909  TROPONINI 0.16*   ------------------------------------------------------------------------------------------------------------------  RADIOLOGY:  Dg Chest 1 View  01/15/2016  CLINICAL DATA:  Tachypnea with labored breathing. Crackles and expiratory wheezing. ALS. EXAM: CHEST 1 VIEW COMPARISON:  01/13/2016 and Feb 13, 2016. Abdominal radiographs 01/14/2016. FINDINGS: 1555 hours. Progressively lower lung volumes with increasing bibasilar airspace opacities. The heart size and mediastinal contours are stable. No pneumothorax or significant pleural effusion identified. There is grossly stable gastric and colonic distention. No acute osseous findings are seen. IMPRESSION: Progressively lower lung volumes with increasing bibasilar atelectasis or infiltrates. Consider aspiration. Colonic and gastric distention again noted. Electronically Signed   By: Carey BullocksWilliam  Veazey  M.D.   On: 01/15/2016 16:29   Dg Abd 1 View  01/14/2016  CLINICAL DATA:  Abdominal distension EXAM: ABDOMEN - 1 VIEW COMPARISON:  01/12/2016 FINDINGS: Moderate gaseous distension of the stomach. Again noted gaseous distension of the colon without significant change from prior exam. Less distension of the rectum. Again noted a rectal tube. Mild gaseous distended small bowel loops with slight improvement from prior exam. IMPRESSION: There is moderate gaseous distension of the stomach. Persistent gaseous distension of the colon. Less distension of the rectum. Mild gaseous distended small bowel loops with slight improvement from prior exam. Again findings suspicious for diffuse ileus. Electronically Signed   By: Natasha MeadLiviu  Pop M.D.   On: 01/14/2016 16:30   Dg Chest Port 1 View  01/16/2016  CLINICAL DATA:  Acute respiratory failure EXAM: PORTABLE CHEST 1 VIEW COMPARISON:  01/15/2016 FINDINGS: Again demonstrated is low lung volumes with bibasilar atelectasis. No significant edema or effusion. No change from yesterday. IMPRESSION: Hypoventilation with bibasilar atelectasis unchanged. Electronically Signed   By: Marlan Palauharles  Clark M.D.   On: 01/16/2016 07:14    EKG:   Orders placed or performed during the hospital encounter of 01/13/16  . EKG 12-Lead  . EKG 12-Lead    ASSESSMENT AND PLAN:  Patient's a 55 year old with diagnosis of ALS # 1. septic shock: due to c.diff and VRE uti  Patient's WBC count continues to be elevated today I have discussed with Dr. Sampson GoonFitzgerald he is considering doing rectal vancomycin Flagyl and oral vancomycin Severe metabolic acidosis related to her underlying infection #2 acute respiratory failure suspect due to aspiration is not able to tolerate BiPAP  #3 history of amyotrophic lateral sclerosis: Has chronic Foley catheter. Prognosis poor  #5 hypernatremia due to free water deficit change IV fluids to D5 #6 mild hypokalemia normal now #7. elevated troponins likely due to  sepsis:    Again had lengthy discussion with the husband regarding her prognosis he still is has attempt about making her completely comfortable will await palliative care input later today  All the records are reviewed and case discussed with Care Management/Social Workerr. Management plans discussed with the patient, family and they are in agreement.   D/w husband states he wants to continue antibiotic CODE STATUS:dnr  TOTAL TIME TAKING CARE OF THIS PATIENT: 35 minutes of critical care time spent from 9 AM to 9:30 high risk of cardiopulmonary arrest  Auburn BilberryPATEL, Jabree Rebert M.D on 01/16/2016 at 11:41 AM  Between 7am to 6pm - Pager - [phone]  After 6pm go to [email or web address] - password [email or web address]  [facility]  Office  [phone]  CC: Primary care physician; Ruthe Mannanalia Aron, MD   Note: This dictation was prepared with Dragon dictation along with smaller phrase technology. Any transcriptional errors that result from this process are unintentional.

## 2016-01-16 NOTE — Progress Notes (Signed)
Report called to 1C nurse.  Valium 5mg  prior to transfer

## 2016-01-16 NOTE — Progress Notes (Addendum)
Palliative Care Update  Events noted over past couple of days.    I had presented the option of hospice to the husband (on 6/20) --and though he was not adamantly against this, he was obviously not ready to stop aggressive care and start comfort care for his wife.   Care Manager has gotten him to agree with Palliative Care in the home (a new service available).  It is my opinion that pt has a very slim chance of going home and in fact, a slim chance of survival.  She does not want a feeding tube or intubation, and given the severe C diff with ongoing ileus, she won't be eating anytime soon. She simply won't live very long while not eating with all her protein and fluid stores draining out of her decubitus.  She has severe pain and had been moaning constantly on Tuesday.   Pt is appropriate for hospice care (and has been since January when she had a drastic wt loss noted).  Husband declined Hospice at that time.  She is even quite appropriate for Hospice Home --meaning that I think that she has less than 2-3 weeks to live (given the lack of nutrition while septic and in respiratory failure with an ileus and a decubitus and dysphagia). Husband wants her to return home but I am concerned that if she returns home, it will be for very terminal care --and not a return to 'things as they were' at home.  She has reached the very end stage of her ALS with associated infections, decubiti, dysphagia, aspiration, and respiratory failure.  Her white cell count is increasing as is her sodium.  She continues with a rectal pouch collecting the diarrheal stool.   CXR from 6/21 showed: Progressively lower lung volumes with increasing bibasilar atelectasis or infiltrates. Consider aspiration. Colonic and gastric distention again noted.  When I saw her on Tuesday, she was having severe pain that was difficult to resolve with increased morphine dosing. We are limited in terms of symptom management options, given that the  goal of care is to try to get her better --and not just keeping her comfortable.     Suan HalterMargaret F Nikitia Asbill, MD

## 2016-01-16 NOTE — Progress Notes (Signed)
Husband and pt home caretaker at bedside.  Decision  made to make pt a comfort care at 1100.   Morphine gtt started at 2mg /hr. Dr Orvan Falconerampbell spoke with pt. Bed control notified of order for 1C

## 2016-01-16 NOTE — Care Management (Signed)
Reviewed chart. Spouse has chosen comfort care. Plan is for patient to go to 1 C for terminal comfort care and possible transfer to the hospice home.

## 2016-01-16 NOTE — Progress Notes (Signed)
Nutrition Brief Note  Chart reviewed. Pt now transitioning to comfort care. NPO. No further nutrition interventions warranted at this time. Will sign off.  Please re-consult as needed.   Romelle Starcherate Brittne Kawasaki MS, RD, LDN [phone] Pager  [phone] Weekend/On-Call Pager

## 2016-01-16 NOTE — Care Management (Signed)
6/21 patient was transferred from the floor to the ICU due to respiratory distress requiring BiPAP

## 2016-01-16 NOTE — Consult Note (Signed)
PULMONARY / CRITICAL CARE MEDICINE   Name: Jennifer [name] MRN: [id number] DOB: 1960-10-13    ADMISSION DATE:  April 18, 2016 CONSULTATION DATE:  01/15/16  REFERRING MD:  Jennifer [name]  CHIEF COMPLAINT:  Increased shortness of breath.  HISTORY OF PRESENT ILLNESS:   Jennifer [name] is a 55 year old female with past medical history significant for ALS, depression, GERD, urinary retention, dysphagia, severely malnourished. Patient presented to ED on 6/17 with recurrent urinary  tract infection and was getting treated for C. Difficile. Patient has been septic secondary to VRE and C. difficile. Her overall condition has worsened since admission. On 6/21 the patient was found to be nonresponsive by the M.D. and was in respiratory distress with rapid respiratory rate and prolonged expiratory phase. Her ABG-7.2/51/160/19.9. Patient was initiated on nonrebreather mask.   Patient is a DO NOT RESUSCITATE/DNI and would not like to be intubated but the spouse is willing to try BiPAP on her for today.  Therefore PCCM team is consulted for further management.  REVIEW OF SYSTEMS:   Unable to obtain due to patient's mental status, abg pending  SUBJECTIVE:  resp failure, on bipap-wean off as tolerted   VITAL SIGNS: BP 131/75 mmHg  Pulse 93  Temp(Src) 98 F (36.7 C) (Axillary)  Resp 12  Ht 5\' 4"  (1.626 m)  Wt 82 lb 3.7 oz (37.3 kg)  BMI 14.11 kg/m2  SpO2 96%   Vent Mode:  [-]  FiO2 (%):  [35 %] 35 %  INTAKE / OUTPUT: I/O last 3 completed shifts: In: 705 [P.O.:60; I.V.:545; IV Piggyback:100] Out: 850 [Urine:750; Stool:100]  PHYSICAL EXAMINATION: General: Severely  Cachectic, white female, very malnourished Neuro: Opens eyes barely when prompted repeatedly, does not follow any commands HEENT:  atraumatic, normocephalic, no discharge noted Cardiovascular :S1 and S2, regular rate and rhythm, no murmur rub or gallop noted Lungs:  faint wheezing, no rhonchi or crackles noted Abdomen:  tympany, no bowel  sounds, distended, rectal tube in place Musculoskeletal:  deformed at baseline, no cyanosis, pedal edema noted Skin:  frail, no obvious lesion or ulcers noted   Recent Labs Lab 01/14/16 0315 01/15/16 0452 01/16/16 0543  NA 142 145 150*  K 3.1* 3.6 4.0  CL 120* 123* 125*  CO2 20* 21* 21*  BUN 17 11 9   CREATININE 0.55 0.49 0.40*  GLUCOSE 106* 144* 126*    Electrolytes  Recent Labs Lab 01/13/16 0314 01/14/16 0315 01/15/16 0452 01/16/16 0543  CALCIUM 7.9* 7.7* 7.7* 7.7*  MG 1.9  --   --   --     CBC  Recent Labs Lab 01/14/16 0315 01/15/16 0452 01/16/16 0543  WBC 19.6* 23.3* 29.9*  HGB 11.5* 10.5* 11.3*  HCT 33.8* 31.0* 33.6*  PLT 235 240 282    Coag's  Recent Labs Lab 12/25/15 1909  APTT 36  INR 1.16    Sepsis Markers  Recent Labs Lab 12/25/15 1909 12/25/15 2308  LATICACIDVEN 1.9 2.0    ABG  Recent Labs Lab 01/15/16 1646  PHART 7.20*  PCO2ART 51*  PO2ART 160*    Liver Enzymes  Recent Labs Lab 12/25/15 1909  AST 39  ALT 43  ALKPHOS 58  BILITOT 1.0  ALBUMIN 2.5*    Cardiac Enzymes  Recent Labs Lab 12/25/15 1909  TROPONINI 0.16*    Glucose  Recent Labs Lab 12/25/15 2250  GLUCAP 88    Imaging Dg Chest 1 View  01/15/2016  CLINICAL DATA:  Tachypnea with labored breathing. Crackles and expiratory wheezing. ALS. EXAM: CHEST  1 VIEW COMPARISON:  01/13/2016 and 10-15-15. Abdominal radiographs 01/14/2016. FINDINGS: 1555 hours. Progressively lower lung volumes with increasing bibasilar airspace opacities. The heart size and mediastinal contours are stable. No pneumothorax or significant pleural effusion identified. There is grossly stable gastric and colonic distention. No acute osseous findings are seen. IMPRESSION: Progressively lower lung volumes with increasing bibasilar atelectasis or infiltrates. Consider aspiration. Colonic and gastric distention again noted. Electronically Signed   By: Carey BullocksWilliam  Veazey M.D.   On: 01/15/2016  16:29   Dg Chest Port 1 View  01/16/2016  CLINICAL DATA:  Acute respiratory failure EXAM: PORTABLE CHEST 1 VIEW COMPARISON:  01/15/2016 FINDINGS: Again demonstrated is low lung volumes with bibasilar atelectasis. No significant edema or effusion. No change from yesterday. IMPRESSION: Hypoventilation with bibasilar atelectasis unchanged. Electronically Signed   By: Marlan Palauharles  Clark M.D.   On: 01/16/2016 07:14     STUDIES:  6/20 x-ray abdomen>>There is moderate gaseous distension of the stomach. Persistentgaseous distension of the colon. Less distension of the rectum. Mildgaseous distended small bowel loops with slight improvement fromprior exam. Again findings suspicious for diffuse ileus.   CULTURES: 6/17 urine culture>> positive for VRE, Klebsiella pneumoniae 6/17 blood culture NGTD  6/17 gastrointestinal panel by PCR > not detected  6/17 C. difficile >> positive    ANTIBIOTICS: 6/20 Zyvox>> 6/18 Flagyl>> 6/18 vancomycin >>  SIGNIFICANT EVENTS: 6/21 patient was transferred from the floor to the ICU due to respiratory distress requiring BiPAP  LINES/TUBE none  DISCUSSION: 55 year old female with history of ALS, depression, GERD, presenting with sepsis secondary to VRE in the urine and C. difficile now in respiratory distress requiring BiPAP.   Patient is a DO NOT RESUSCITATE/DNI and do not want any aggressive measures.   PULMONARY A: Acute hypoxemic, hypercarbic respiratory failure secondary to sepsis Tobacco abuse  Dysphagia  Severe protein calorie malnutrition  Increased risk of aspiration due to ALS and dysphagia  P:  NPO NOW  Continue to support oxygen to keep sats > 88% Bronchodilators Duoneb Morphine when necessary   CARDIOVASCULAR Continuous telemetry  RENAL A:   Hypernatremia Sepsis related to Urinary tract infection  Urinary retention  P:   Follow chemistry Zyvox for VRE in urine Monitor I/O Replace electrolytes per unit  protocol.   GASTROINTESTINAL A:   Entritis due to C. Difficile Abdominal pain related to CHF P:   nothing by mouth Pepcid for GI prophylaxis Vancomycin/Flagyl for C. difficile  HEMATOLOGIC A:   No active issues P:  SCDs Lovenox for DVT prophylaxis Transfuse if Hgb <7  INFECTIOUS A:   Septic secondary to VRE and C. 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  Critical Care Time devoted to patient care services described in this note is 45 minutes.   Overall, patient is critically ill, prognosis is guarded.  high risk for cardiac arrest and death.    Lucie LeatherKurian David Chadd Tollison, M.D.  [facility]  Medical Director [facility] Medical Director [facility] Cardio[facility]

## 2016-01-17 MED ORDER — ACETAMINOPHEN 10 MG/ML IV SOLN
1000.0000 mg | Freq: Four times a day (QID) | INTRAVENOUS | Status: AC | PRN
  Filled 2016-01-17: qty 100

## 2016-01-17 MED ORDER — SCOPOLAMINE 1 MG/3DAYS TD PT72
1.0000 | MEDICATED_PATCH | TRANSDERMAL | Status: DC
Start: 2016-01-17 — End: 2016-01-23
  Administered 2016-01-17 – 2016-01-20 (×2): 1.5 mg via TRANSDERMAL
  Filled 2016-01-17 (×3): qty 1

## 2016-01-17 NOTE — Progress Notes (Signed)
[facility] [place] [facility]   PATIENT NAME: Sula RumpleKatherine Cody    MR#:  [id number]  DATE OF BIRTH:  [birth year]  is at the beds Chief Complaint  Patient presents with  . Urinary Tract Infection  . Code Sepsis   REVIEW OF SYSTEMS:  Patient made comfort care currently on a morphine drip. Appears comfortable husband at bedside    Review of Systems  Unable to perform Due to chronic base line    DRUG ALLERGIES:   Allergies  Allergen Reactions  . Bee Venom Anaphylaxis  . Hydrocodone Other (See Comments)    Reaction:  Muscle stiffness   . Lipitor [Atorvastatin] Other (See Comments)    Reaction:  Muscle cramps   . Shellfish Allergy Anaphylaxis  . Oxycodone Other (See Comments)    Reaction:  Muscle stiffness   . Prednisone Other (See Comments)    Reaction:  Psychosis   . Vicodin [Hydrocodone-Acetaminophen] Other (See Comments)    Reaction:  Muscle stiffness   . Acyclovir And Related   . Oxycontin [Oxycodone Hcl] Other (See Comments)    Reaction:  Muscle stiffness   . Peanut-Containing Drug Products Diarrhea    VITALS:  Blood pressure 114/57, pulse 105, temperature 99.3 F (37.4 C), temperature source Oral, resp. rate 21, height 5\' 4"  (1.626 m), weight 37.3 kg (82 lb 3.7 oz), SpO2 95 %.  PHYSICAL EXAMINATION:  GENERAL:  55 y.o.-year-old patient lying in the bed with Critically ill-appearing  EYES: Pupils equal, round, reactive to light and accommodation. No scleral icterus.  HEENT: Head atraumatic, normocephalic. Oropharynx and nasopharynx clear.  NECK:  Supple, no jugular venous distention. No thyroid enlargement, no tenderness.  LUNGS:  Assess her muscle usage with rhonchus breath sounds CARDIOVASCULAR: S1, S2 normal. No murmurs, rubs, or gallops.  ABDOMEN:  Abdomen is distended and epigastric tenderness no bowel sounds EXTREMITIES: No pedal edema, cyanosis, or clubbing.  Neurological:Sedated  Psych sedated SKIN: No obvious rash,  lesion, or ulcer.    LABORATORY PANEL:   CBC  Recent Labs Lab 01/16/16 0543  WBC 29.9*  HGB 11.3*  HCT 33.6*  PLT 282   ------------------------------------------------------------------------------------------------------------------  Chemistries   Recent Labs Lab 02/29/2016 1909  01/13/16 0314  01/16/16 0543  NA 131*  < > 139  < > 150*  K 5.1  < > 3.0*  < > 4.0  CL 103  < > 117*  < > 125*  CO2 14*  < > 15*  < > 21*  GLUCOSE 81  < > 77  < > 126*  BUN 51*  < > 29*  < > 9  CREATININE 2.71*  < > 0.79  < > 0.40*  CALCIUM 8.0*  < > 7.9*  < > 7.7*  MG  --   --  1.9  --   --   AST 39  --   --   --   --   ALT 43  --   --   --   --   ALKPHOS 58  --   --   --   --   BILITOT 1.0  --   --   --   --   < > = values in this interval not displayed. ------------------------------------------------------------------------------------------------------------------  Cardiac Enzymes  Recent Labs Lab 02/29/2016 1909  TROPONINI 0.16*   ------------------------------------------------------------------------------------------------------------------  RADIOLOGY:  Dg Chest 1 View  01/15/2016  CLINICAL DATA:  Tachypnea with labored breathing. Crackles and expiratory wheezing. ALS. EXAM: CHEST 1 VIEW  COMPARISON:  01/13/2016 and Dec 16, 2015. Abdominal radiographs 01/14/2016. FINDINGS: 1555 hours. Progressively lower lung volumes with increasing bibasilar airspace opacities. The heart size and mediastinal contours are stable. No pneumothorax or significant pleural effusion identified. There is grossly stable gastric and colonic distention. No acute osseous findings are seen. IMPRESSION: Progressively lower lung volumes with increasing bibasilar atelectasis or infiltrates. Consider aspiration. Colonic and gastric distention again noted. Electronically Signed   By: Carey BullocksWilliam  Veazey M.D.   On: 01/15/2016 16:29   Dg Chest Port 1 View  01/16/2016  CLINICAL DATA:  Acute respiratory failure EXAM:  PORTABLE CHEST 1 VIEW COMPARISON:  01/15/2016 FINDINGS: Again demonstrated is low lung volumes with bibasilar atelectasis. No significant edema or effusion. No change from yesterday. IMPRESSION: Hypoventilation with bibasilar atelectasis unchanged. Electronically Signed   By: Marlan Palauharles  Clark M.D.   On: 01/16/2016 07:14    EKG:   Orders placed or performed during the hospital encounter of 01-30-16  . EKG 12-Lead  . EKG 12-Lead    ASSESSMENT AND PLAN:  Patient's a 55 year old with diagnosis of ALS # 1. septic shock: due to c.diff and VRE uti  No improvement   #2 acute respiratory failure supportive care  #3 history of amyotrophic lateral sclerosis: Has chronic Foley catheter. Prognosis poor  #5 hypernatremia continue D5W   #6 mild hypokalemia no further lab work #7. elevated troponins likely due to sepsis:    Continue comfort care measures  All the records are reviewed and case discussed with Care Management/Social Workerr. Management plans discussed with the patient, family and they are in agreement.   D/w husband states he wants to continue antibiotic CODE STATUS:dnr  TOTAL TIME TAKING CARE OF THIS PATIENT: 22min  Sonya Gunnoe M.D on 01/17/2016 at 12:39 PM  Between 7am to 6pm - Pager - [phone]  After 6pm go to [email or web address] - password [email or web address]  [facility]  Office  [phone]  CC: Primary care physician; Ruthe Mannanalia Aron, MD   Note: This dictation was prepared with Dragon dictation along with smaller phrase technology. Any transcriptional errors that result from this process are unintentional.

## 2016-01-17 NOTE — Progress Notes (Signed)
Palliative Care Update  Pt is too unstable for transport elsewhere. She is having periods of apnea now.  Morphine is increased due to some gasping, wet respirations.  Full note to follow.  Suan HalterMargaret F Koya Hunger, MD

## 2016-01-17 NOTE — Progress Notes (Signed)
Palliative Medicine Inpatient Consult Follow Up Note   Name: Jennifer [name] Date: 01/17/2016 MRN: [id number]  DOB: 1960-12-10  Referring Physician: Auburn BilberryShreyang Patel, MD  Palliative Care consult requested for this 55 y.o. female for goals of medical therapy in patient with end stage ALS.  DISCUSSIONS AND PLANS: Pt has had the morphine drip increased to 6mg / hr and pt's nurse and I both think she could use a slight increase due to some mild resp distress symptoms. Pt is having periods of apnea.  I provided supportive conversation to pts husband.    She will pass away here. Not stable for traasport.     ACTIVE PROBLEMS: Septic Shock (initial BP was 66/40) --with leukocytosis --due to C Diff colitis Acute Resp Failure --due to aspiration pneumonia Aspiration Pneumonia Acute UTI vs colonization ---with h/o recurrent UTIs ---now growing VRE >100K colonies and also 30K klebsiella pneumo -------previously had similar organisms with UTI in Feb and got Ertapenam, Dapto, and Fosfomycin. ---blood cxs negative from 6/17. ABX for UTI held per ID recs.  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 BMI is 14.11.  ---related to dysphagia and chronic  illness with severe depletion of body fat and muscle ---with stage 3 right sacral pressure ulcer  ------present on admission  ------2 cm by 2 cm by 0.2 cm H/O acute resp failure with intubation in Feb 2017 Severe pain ---due to ALS muscle spasms and abdominal pain from ileus     CODE STATUS: DNR   PAST MEDICAL HISTORY: Past Medical History  Diagnosis Date  . ALS (amyotrophic lateral sclerosis) (HCC)   . Depression   . GERD (gastroesophageal reflux disease)   . Urinary retention   . Dysphagia, oropharyngeal 11/28/2013  . CN (constipation) 11/28/2013    PAST SURGICAL HISTORY:  Past Surgical History  Procedure Laterality Date  . Cesarean section    . Nasal sinus surgery      Vital Signs: BP 114/57 mmHg  Pulse 105  Temp(Src) 99.3 F (37.4 C) (Oral)  Resp 21  Ht 5\' 4"  (1.626 m)  Wt 37.3 kg (82 lb 3.7 oz)  BMI 14.11 kg/m2  SpO2 95% Filed Weights   01/17/2016 2005 01/21/2016 2310  Weight: 36.798 kg (81 lb 2 oz) 37.3 kg (82 lb 3.7 oz)    Estimated body mass index is 14.11 kg/(m^2) as calculated from the following:   Height as of this encounter: 5\' 4"  (1.626 m).   Weight as of this encounter: 37.3 kg (82 lb 3.7 oz).  PHYSICAL EXAM: She has occasional gasping wet respirations noted --some apnea noted between respirations Eyes closed No JVD or TM Hrt rrr no m  Lungs with decreased BS bases and ronchi throughout as well as upper airway bronchial and secretion rattles Abd tight and quiet Ext no cyanosis or mottling  as yet   LABS: CBC:    Component Value Date/Time   WBC 29.9* 01/16/2016 0543   WBC 9.8 10/15/2013 0545   HGB 11.3* 01/16/2016 0543   HGB 12.4 10/15/2013 0545   HCT 33.6* 01/16/2016 0543   HCT 35.7 10/15/2013 0545   PLT 282 01/16/2016 0543   PLT 213 10/15/2013 0545   MCV 95.0 01/16/2016 0543   MCV 94 10/15/2013 0545   NEUTROABS 10.9* 01/17/2016 1909   NEUTROABS 8.1* 10/15/2013 0545   LYMPHSABS 0.6* 01/21/2016 1909   LYMPHSABS 1.2 10/15/2013 0545    MONOABS 0.2 01/16/2016 1909   MONOABS 0.4 10/15/2013 0545   EOSABS 0.1 01/18/2016 1909   EOSABS 0.1 10/15/2013 0545   BASOSABS 0.0 01/19/2016 1909   BASOSABS 0.0 10/15/2013 0545   Comprehensive Metabolic Panel:    Component Value Date/Time   NA 150* 01/16/2016 0543   NA 140 10/15/2013 0545   K 4.0 01/16/2016 0543   K 3.5 10/15/2013 0545   CL 125* 01/16/2016 0543   CL 110* 10/15/2013 0545   CO2 21* 01/16/2016 0543   CO2 25 10/15/2013 0545   BUN 9 01/16/2016 0543   BUN 10 10/15/2013 0545   CREATININE 0.40* 01/16/2016 0543   CREATININE 0.63 10/15/2013 0545   GLUCOSE 126* 01/16/2016 0543   GLUCOSE 71 10/15/2013 0545   CALCIUM 7.7* 01/16/2016 0543   CALCIUM 8.7 10/15/2013 0545   AST 39 01/01/2016 1909   AST 30 10/14/2013 1716   ALT 43 01/13/2016 1909   ALT 20 10/14/2013 1716   ALKPHOS 58 01/13/2016 1909   ALKPHOS 62 10/14/2013 1716   BILITOT 1.0 01/14/2016 1909   BILITOT 0.3 10/14/2013 1716   PROT 5.1* 01/08/2016 1909   PROT 7.9 10/14/2013 1716   ALBUMIN 2.5* 12/31/2015 1909   ALBUMIN 4.1 10/14/2013 1716     More than 50% of the visit was spent in counseling/coordination of care: YES  Time Spent:  35 min

## 2016-01-18 NOTE — Progress Notes (Signed)
[facility] [place] [facility]   PATIENT NAME: Jennifer RumpleKatherine [name]    MR#:  [id number]  DATE OF BIRTH:  [birth year]  is at the beds Chief Complaint  Patient presents with  . Urinary Tract Infection  . Code Sepsis   REVIEW OF SYSTEMS:   pt comfortable now having Some respiratory difficulties husband at bedside    Review of Systems  Unable to perform Due to chronic base line    DRUG ALLERGIES:   Allergies  Allergen Reactions  . Bee Venom Anaphylaxis  . Hydrocodone Other (See Comments)    Reaction:  Muscle stiffness   . Lipitor [Atorvastatin] Other (See Comments)    Reaction:  Muscle cramps   . Shellfish Allergy Anaphylaxis  . Oxycodone Other (See Comments)    Reaction:  Muscle stiffness   . Prednisone Other (See Comments)    Reaction:  Psychosis   . Vicodin [Hydrocodone-Acetaminophen] Other (See Comments)    Reaction:  Muscle stiffness   . Acyclovir And Related   . Oxycontin [Oxycodone Hcl] Other (See Comments)    Reaction:  Muscle stiffness   . Peanut-Containing Drug Products Diarrhea    VITALS:  Blood pressure 98/53, pulse 92, temperature 98.8 F (37.1 C), temperature source Oral, resp. rate 12, height 5\' 4"  (1.626 m), weight 37.3 kg (82 lb 3.7 oz), SpO2 100 %.  PHYSICAL EXAMINATION:  GENERAL:  55 y.o.-year-old patient lying in the bed with Critically ill-appearing  EYES: Pupils equal, round, reactive to light and accommodation. No scleral icterus.  HEENT: Head atraumatic, normocephalic. Oropharynx and nasopharynx clear.  NECK:  Supple, no jugular venous distention. No thyroid enlargement, no tenderness.  LUNGS: Persistent necessary muscle usage Rhonchorous breath sounds CARDIOVASCULAR: S1, S2 normal. No murmurs, rubs, or gallops.  ABDOMEN:  Abdomen is distended and epigastric tenderness no bowel sounds EXTREMITIES: No pedal edema, cyanosis, or clubbing.  Neurological:Sedated  Psych sedated SKIN: No obvious rash, lesion, or ulcer.     LABORATORY PANEL:   CBC  Recent Labs Lab 01/16/16 0543  WBC 29.9*  HGB 11.3*  HCT 33.6*  PLT 282   ------------------------------------------------------------------------------------------------------------------  Chemistries   Recent Labs Lab 12-04-2015 1909  01/13/16 0314  01/16/16 0543  NA 131*  < > 139  < > 150*  K 5.1  < > 3.0*  < > 4.0  CL 103  < > 117*  < > 125*  CO2 14*  < > 15*  < > 21*  GLUCOSE 81  < > 77  < > 126*  BUN 51*  < > 29*  < > 9  CREATININE 2.71*  < > 0.79  < > 0.40*  CALCIUM 8.0*  < > 7.9*  < > 7.7*  MG  --   --  1.9  --   --   AST 39  --   --   --   --   ALT 43  --   --   --   --   ALKPHOS 58  --   --   --   --   BILITOT 1.0  --   --   --   --   < > = values in this interval not displayed. ------------------------------------------------------------------------------------------------------------------  Cardiac Enzymes  Recent Labs Lab 12-04-2015 1909  TROPONINI 0.16*   ------------------------------------------------------------------------------------------------------------------  RADIOLOGY:  No results found.  EKG:   Orders placed or performed during the hospital encounter of 12-04-2015  . EKG 12-Lead  . EKG 12-Lead  ASSESSMENT AND PLAN:  Patient's a 55 year old with diagnosis of ALS # 1. septic shock: due to c.diff and VRE uti  No improvement Now made comfort care  #2 acute respiratory failure supportive care  #3 history of amyotrophic lateral sclerosis: Has chronic Foley catheter. Prognosis poor  #5 hypernatremia comfort care  #6 mild hypokalemia no further lab work due to comfort care #7. elevated troponins likely due to sepsis:    Continue comfort care measures prognosis poor  All the records are reviewed and case discussed with Care Management/Social Workerr. Management plans discussed with the patient, family and they are in agreement.   D/w husband states he wants to continue antibiotic CODE  STATUS:dnr  TOTAL TIME TAKING CARE OF THIS PATIENT: 22min  Thamas Appleyard M.D on 01/18/2016 at 11:10 AM  Between 7am to 6pm - Pager - [phone]  After 6pm go to [email or web address] - password [email or web address]  [facility] [place] [facility]  Office  [phone]  CC: Primary care physician; Ruthe Mannanalia Aron, MD   Note: This dictation was prepared with Dragon dictation along with smaller phrase technology. Any transcriptional errors that result from this process are unintentional.

## 2016-01-19 NOTE — Plan of Care (Signed)
Problem: Pain Managment: Goal: General experience of comfort will improve Outcome: Progressing Morphine at 4310mL/hr.  Patient comfortable.

## 2016-01-19 NOTE — Progress Notes (Signed)
[facility] [place] at [facility]   PATIENT NAME: Jennifer RumpleKatherine [name]    MR#:  [id number]  DATE OF BIRTH:  July 24, 1961  is at the beds Chief Complaint  Patient presents with  . Urinary Tract Infection  . Code Sepsis   REVIEW OF SYSTEMS:   Patient is comfortable husband at bedside    Review of Systems  Unable to perform Due to unresponsive state  DRUG ALLERGIES:   Allergies  Allergen Reactions  . Bee Venom Anaphylaxis  . Hydrocodone Other (See Comments)    Reaction:  Muscle stiffness   . Lipitor [Atorvastatin] Other (See Comments)    Reaction:  Muscle cramps   . Shellfish Allergy Anaphylaxis  . Oxycodone Other (See Comments)    Reaction:  Muscle stiffness   . Prednisone Other (See Comments)    Reaction:  Psychosis   . Vicodin [Hydrocodone-Acetaminophen] Other (See Comments)    Reaction:  Muscle stiffness   . Acyclovir And Related   . Oxycontin [Oxycodone Hcl] Other (See Comments)    Reaction:  Muscle stiffness   . Peanut-Containing Drug Products Diarrhea    VITALS:  Blood pressure 100/61, pulse 88, temperature 98.8 F (37.1 C), temperature source Oral, resp. rate 16, height 5\' 4"  (1.626 m), weight 37.3 kg (82 lb 3.7 oz), SpO2 99 %.  PHYSICAL EXAMINATION:  GENERAL:  55 y.o.-year-old patient lying in the bed with Critically ill-appearing  EYES: Pupils equal, round, reactive to light and accommodation. No scleral icterus.  HEENT: Head atraumatic, normocephalic. Oropharynx and nasopharynx clear.  NECK:  Supple, no jugular venous distention. No thyroid enlargement, no tenderness.  LUNGS: Persistent necessary muscle usage Rhonchorous breath sounds CARDIOVASCULAR: S1, S2 normal. No murmurs, rubs, or gallops.  ABDOMEN:  Abdomen is distended and epigastric tenderness no bowel sounds EXTREMITIES: No pedal edema, cyanosis, or clubbing.  Neurological:Sedated  Psych sedated SKIN: No obvious rash, lesion, or ulcer.    LABORATORY PANEL:    CBC  Recent Labs Lab 01/16/16 0543  WBC 29.9*  HGB 11.3*  HCT 33.6*  PLT 282   ------------------------------------------------------------------------------------------------------------------  Chemistries   Recent Labs Lab 01/13/16 0314  01/16/16 0543  NA 139  < > 150*  K 3.0*  < > 4.0  CL 117*  < > 125*  CO2 15*  < > 21*  GLUCOSE 77  < > 126*  BUN 29*  < > 9  CREATININE 0.79  < > 0.40*  CALCIUM 7.9*  < > 7.7*  MG 1.9  --   --   < > = values in this interval not displayed. ------------------------------------------------------------------------------------------------------------------  Cardiac Enzymes No results for input(s): TROPONINI in the last 168 hours. ------------------------------------------------------------------------------------------------------------------  RADIOLOGY:  No results found.  EKG:   Orders placed or performed during the hospital encounter of 01/19/2016  . EKG 12-Lead  . EKG 12-Lead    ASSESSMENT AND PLAN:  Patient's a 55 year old with diagnosis of ALS now comfort care # 1. septic shock: due to c.diff and VRE uti  No improvement Continue comfort care measures  #2 acute respiratory failure supportive care  #3 history of amyotrophic lateral sclerosis: Has chronic Foley catheter. Prognosis poor  #5 hypernatremia comfort care  #6 mild hypokalemia no further lab work due to comfort care #7. elevated troponins likely due to sepsis:    Continue comfort care measures prognosis poor  All the records are reviewed and case discussed with Care Management/Social Workerr. Management plans discussed with the patient, family and they are in agreement.  D/w husband states he wants to continue antibiotic CODE STATUS:dnr  TOTAL TIME TAKING CARE OF THIS PATIENT: 22min  Keiko Myricks M.D on 01/19/2016 at 11:30 AM  Between 7am to 6pm - Pager - [phone]  After 6pm go to [email or web address] - password [email or web address]  [facility]  Office  [phone]  CC: Primary care physician; Jennifer Mannanalia Aron, MD   Note: This dictation was prepared with Dragon dictation along with smaller phrase technology. Any transcriptional errors that result from this process are unintentional.

## 2016-01-20 NOTE — Progress Notes (Signed)
[facility] [place] [facility]   PATIENT NAME: Jennifer RumpleKatherine Hankey    MR#:  [id number]  DATE OF BIRTH:  12-21-1960  is at the beds Chief Complaint  Patient presents with  . Urinary Tract Infection  . Code Sepsis   REVIEW OF SYSTEMS:  On comfort care.    Review of Systems  Unable to perform Due to unresponsive state  DRUG ALLERGIES:   Allergies  Allergen Reactions  . Bee Venom Anaphylaxis  . Hydrocodone Other (See Comments)    Reaction:  Muscle stiffness   . Lipitor [Atorvastatin] Other (See Comments)    Reaction:  Muscle cramps   . Shellfish Allergy Anaphylaxis  . Oxycodone Other (See Comments)    Reaction:  Muscle stiffness   . Prednisone Other (See Comments)    Reaction:  Psychosis   . Vicodin [Hydrocodone-Acetaminophen] Other (See Comments)    Reaction:  Muscle stiffness   . Acyclovir And Related   . Oxycontin [Oxycodone Hcl] Other (See Comments)    Reaction:  Muscle stiffness   . Peanut-Containing Drug Products Diarrhea    VITALS:  Blood pressure 102/75, pulse 93, temperature 98.5 F (36.9 C), temperature source Oral, resp. rate 16, height 5\' 4"  (1.626 m), weight 37.3 kg (82 lb 3.7 oz), SpO2 99 %.  PHYSICAL EXAMINATION:  GENERAL:  55 y.o.-year-old patient lying in the bed with Critically ill-appearing  EYES: Pupils equal, round, reactive to light and accommodation. No scleral icterus.  HEENT: Head atraumatic, normocephalic. Oropharynx and nasopharynx clear.  NECK:  Supple, no jugular venous distention. No thyroid enlargement, no tenderness.  LUNGS: Persistent necessary muscle usage Rhonchorous breath sounds CARDIOVASCULAR: S1, S2 normal. No murmurs, rubs, or gallops.  ABDOMEN:  Abdomen is distended and epigastric tenderness no bowel sounds EXTREMITIES: No pedal edema, cyanosis, or clubbing.  Neurological:Sedated  Psych sedated SKIN: No obvious rash, lesion, or ulcer.    LABORATORY PANEL:   CBC  Recent Labs Lab  01/16/16 0543  WBC 29.9*  HGB 11.3*  HCT 33.6*  PLT 282   ------------------------------------------------------------------------------------------------------------------  Chemistries   Recent Labs Lab 01/16/16 0543  NA 150*  K 4.0  CL 125*  CO2 21*  GLUCOSE 126*  BUN 9  CREATININE 0.40*  CALCIUM 7.7*   ------------------------------------------------------------------------------------------------------------------  Cardiac Enzymes No results for input(s): TROPONINI in the last 168 hours. ------------------------------------------------------------------------------------------------------------------  RADIOLOGY:  No results found.  EKG:   Orders placed or performed during the hospital encounter of 10/09/2015  . EKG 12-Lead  . EKG 12-Lead    ASSESSMENT AND PLAN:  Patient's a 55 year old with diagnosis of ALS now comfort care # 1. septic shock: due to c.diff and VRE uti  No improvement Continue comfort care measures On morphine drip  #2 acute respiratory failure supportive care  #3 history of amyotrophic lateral sclerosis: Has chronic Foley catheter. Prognosis poor  #5 hypernatremia comfort care  #6 mild hypokalemia no further lab work due to comfort care #7. elevated troponins likely due to sepsis:    Continue comfort care measures prognosis poor  All the records are reviewed and case discussed with Care Management/Social Workerr. Management plans discussed with the patient, family and they are in agreement.   D/w husband states he wants to continue antibiotic CODE STATUS:dnr  TOTAL TIME TAKING CARE OF THIS PATIENT: 22min  Affan Callow M.D on 01/20/2016 at 4:31 PM  Between 7am to 6pm - Pager - [phone]  After 6pm go to [email or web address] - password [email or web address] [email or web address]  [facility]  Office  [phone]  CC: Primary care physician; Ruthe Mannanalia Aron, MD   Note: This dictation was prepared with Dragon dictation along with smaller phrase  technology. Any transcriptional errors that result from this process are unintentional.

## 2016-01-20 NOTE — Progress Notes (Signed)
Patient with no further diarrhea. Now on comfort care.   I will sign off.  Please call if any further GI concerns or questions.  We would like to thank you for the opportunity to participate in the care of Jennifer [name].

## 2016-01-21 MED ORDER — GLYCOPYRROLATE 0.2 MG/ML IJ SOLN
0.4000 mg | INTRAMUSCULAR | Status: DC | PRN
  Administered 2016-01-21 – 2016-01-23 (×5): 0.4 mg via INTRAVENOUS
  Filled 2016-01-21 (×5): qty 2

## 2016-01-21 NOTE — Progress Notes (Signed)
[facility] [place] at [facility]   PATIENT NAME: Jennifer RumpleKatherine [name]    MR#:  [id number]  DATE OF BIRTH:  Sep 04, 1960  SUBJECTIVE:  CHIEF COMPLAINT:   Chief Complaint  Patient presents with  . Urinary Tract Infection  . Code Sepsis   - Patient with known ALS, admitted with sepsis, recurrent UTI and c.diff colitis - now comfort care, husband at bedside - on morphine drip  REVIEW OF SYSTEMS:  Review of Systems  Unable to perform ROS: critical illness    DRUG ALLERGIES:   Allergies  Allergen Reactions  . Bee Venom Anaphylaxis  . Hydrocodone Other (See Comments)    Reaction:  Muscle stiffness   . Lipitor [Atorvastatin] Other (See Comments)    Reaction:  Muscle cramps   . Shellfish Allergy Anaphylaxis  . Oxycodone Other (See Comments)    Reaction:  Muscle stiffness   . Prednisone Other (See Comments)    Reaction:  Psychosis   . Vicodin [Hydrocodone-Acetaminophen] Other (See Comments)    Reaction:  Muscle stiffness   . Acyclovir And Related   . Oxycontin [Oxycodone Hcl] Other (See Comments)    Reaction:  Muscle stiffness   . Peanut-Containing Drug Products Diarrhea    VITALS:  Blood pressure 105/64, pulse 54, temperature 98 F (36.7 C), temperature source Oral, resp. rate 12, height 5\' 4"  (1.626 m), weight 37.3 kg (82 lb 3.7 oz), SpO2 90 %.  PHYSICAL EXAMINATION:  Physical Exam  GENERAL:  55 y.o.-year-old patient lying in the bed with no acute distress. Critically ill appearing. EYES: Pupils equal, round, reactive to light and accommodation. No scleral icterus. Extraocular muscles intact.  HEENT: Head atraumatic, normocephalic. Oropharynx and nasopharynx clear.  NECK:  Supple, no jugular venous distention. No thyroid enlargement, no tenderness.  LUNGS: gurgling wet breath sounds heard, occasional use of accessory muscles CARDIOVASCULAR: S1, S2 normal. No murmurs, rubs, or gallops.  ABDOMEN: Soft, nontender, nondistended. Bowel sounds present.  No organomegaly or mass.  EXTREMITIES: No pedal edema, cyanosis, or clubbing.  NEUROLOGIC: Patient is lethargic  PSYCHIATRIC: The patient is lethargic, occasional eye opening noted with palpation, no tracking.  SKIN: No obvious rash, lesion, or ulcer.    LABORATORY PANEL:   CBC  Recent Labs Lab 01/16/16 0543  WBC 29.9*  HGB 11.3*  HCT 33.6*  PLT 282   ------------------------------------------------------------------------------------------------------------------  Chemistries   Recent Labs Lab 01/16/16 0543  NA 150*  K 4.0  CL 125*  CO2 21*  GLUCOSE 126*  BUN 9  CREATININE 0.40*  CALCIUM 7.7*   ------------------------------------------------------------------------------------------------------------------  Cardiac Enzymes No results for input(s): TROPONINI in the last 168 hours. ------------------------------------------------------------------------------------------------------------------  RADIOLOGY:  No results found.  EKG:   Orders placed or performed during the hospital encounter of 01/15/2016  . EKG 12-Lead  . EKG 12-Lead    ASSESSMENT AND PLAN:   55 year old female with past medical history significant for progressive ALS, GERD, depression, recurrent UTI's, bed-bound status at baseline admitted with sepsis. She was admitted to ICU for severe sepsis, C. difficile colitis and recurrent UTI. She had severe metabolic acidosis, was on nonrebreather mask, was found unresponsive on the floor after admission. She was a DO NOT RESUSCITATE and asked and answered discussion with the intensivist and palliative care team, family has decided DO NOT RESUSCITATE and, further care.  Patient is currently on morphine drip, occasionally arouses, no tracking. Remains comfortable. Still needing IV morphine and Ativan when necessary. Wet gurgly secretions heard. DC IV fluids. On scopolamine patch.  Also Robinul when necessary for secretions  Final diagnosis:  1. Sepsis  secondary to VRE UTI and C. difficile colitis 2. C. difficile colitis 3. Severe metabolic acidosis 4. Acute metabolic encephalopathy 5. Hypernatremia 6. ALS 7. GERD  Husband updated at bedside.   All the records are reviewed and case discussed with Care Management/Social Workerr. Management plans discussed with the patient, family and they are in agreement.  CODE STATUS: DNR  TOTAL TIME TAKING CARE OF THIS PATIENT: 16 minutes.    Enid BaasKALISETTI,Jaquayla Hege M.D on 01/21/2016 at 8:56 AM  Between 7am to 6pm - Pager - [phone]  After 6pm go to [email or web address] - password [email or web address] [email or web address]  [facility]  Office  [phone]  CC: Primary care physician; Ruthe Mannanalia Aron, MD

## 2016-01-22 MED ORDER — SCOPOLAMINE 1 MG/3DAYS TD PT72: 1.0000 | MEDICATED_PATCH | TRANSDERMAL | Status: AC

## 2016-01-22 MED ORDER — LORAZEPAM 2 MG/ML PO CONC: 2.0000 mg | ORAL | Status: AC | PRN

## 2016-01-22 MED ORDER — MORPHINE SULFATE (CONCENTRATE) 10 MG /0.5 ML PO SOLN: 20.0000 mg | ORAL | Status: AC | PRN

## 2016-01-22 MED ORDER — MORPHINE 100MG IN NS 100ML (1MG/ML) PREMIX INFUSION: 10.0000 mg/h | INTRAVENOUS | Status: AC

## 2016-01-22 MED ORDER — ATROPINE SULFATE 1 % OP SOLN: 2.0000 [drp] | OPHTHALMIC | Status: AC | PRN

## 2016-01-22 MED ORDER — GLYCOPYRROLATE 0.2 MG/ML IJ SOLN: 0.4000 mg | INTRAMUSCULAR | Status: AC | PRN

## 2016-01-22 NOTE — Progress Notes (Signed)
[facility] [place] [facility]   PATIENT NAME: Jennifer RumpleKatherine [name]    MR#:  [id number]  DATE OF BIRTH:  [birth year]  SUBJECTIVE:  CHIEF COMPLAINT:   Chief Complaint  Patient presents with  . Urinary Tract Infection  . Code Sepsis   - Patient with known ALS, admitted with sepsis, recurrent UTI and c.diff colitis - now comfort care, husband at bedside - on morphine drip- remains at 10mg /hr- vitals stable  REVIEW OF SYSTEMS:  Review of Systems  Unable to perform ROS: critical illness    DRUG ALLERGIES:   Allergies  Allergen Reactions  . Bee Venom Anaphylaxis  . Hydrocodone Other (See Comments)    Reaction:  Muscle stiffness   . Lipitor [Atorvastatin] Other (See Comments)    Reaction:  Muscle cramps   . Shellfish Allergy Anaphylaxis  . Oxycodone Other (See Comments)    Reaction:  Muscle stiffness   . Prednisone Other (See Comments)    Reaction:  Psychosis   . Vicodin [Hydrocodone-Acetaminophen] Other (See Comments)    Reaction:  Muscle stiffness   . Acyclovir And Related   . Oxycontin [Oxycodone Hcl] Other (See Comments)    Reaction:  Muscle stiffness   . Peanut-Containing Drug Products Diarrhea    VITALS:  Blood pressure 138/107, pulse 100, temperature 98.3 F (36.8 C), temperature source Oral, resp. rate 16, height 5\' 4"  (1.626 m), weight 37.3 kg (82 lb 3.7 oz), SpO2 95 %.  PHYSICAL EXAMINATION:  Physical Exam  GENERAL:  55 y.o.-year-old patient lying in the bed with no acute distress. Critically ill appearing. EYES: Pupils equal, round, reactive to light and accommodation. No scleral icterus. Extraocular muscles intact.  HEENT: Head atraumatic, normocephalic. Oropharynx and nasopharynx clear.  NECK:  Supple, no jugular venous distention. No thyroid enlargement, no tenderness.  LUNGS: gurgling wet breath sounds heard, occasional use of accessory muscles CARDIOVASCULAR: S1, S2 normal. No murmurs, rubs, or gallops.  ABDOMEN: Soft,  nontender, nondistended. Bowel sounds present. No organomegaly or mass.  EXTREMITIES: No pedal edema, cyanosis, or clubbing.  NEUROLOGIC: Patient is lethargic  PSYCHIATRIC: The patient is lethargic, occasional eye opening noted with palpation, no tracking.  SKIN: No obvious rash, lesion, or ulcer.    LABORATORY PANEL:   CBC  Recent Labs Lab 01/16/16 0543  WBC 29.9*  HGB 11.3*  HCT 33.6*  PLT 282   ------------------------------------------------------------------------------------------------------------------  Chemistries   Recent Labs Lab 01/16/16 0543  NA 150*  K 4.0  CL 125*  CO2 21*  GLUCOSE 126*  BUN 9  CREATININE 0.40*  CALCIUM 7.7*   ------------------------------------------------------------------------------------------------------------------  Cardiac Enzymes No results for input(s): TROPONINI in the last 168 hours. ------------------------------------------------------------------------------------------------------------------  RADIOLOGY:  No results found.  EKG:   Orders placed or performed during the hospital encounter of 01-31-2016  . EKG 12-Lead  . EKG 12-Lead    ASSESSMENT AND PLAN:   55 year old female with past medical history significant for progressive ALS, GERD, depression, recurrent UTI's, bed-bound status at baseline admitted with sepsis. She was admitted to ICU for severe sepsis, C. difficile colitis and recurrent UTI. She had severe metabolic acidosis, was on nonrebreather mask, was found unresponsive on the floor after admission. She was a DO NOT RESUSCITATE and discussion with the intensivist and palliative care team, family has decided DO NOT RESUSCITATE and, further care.  Patient is currently on morphine drip, occasionally arouses. Remains comfortable. Still needing IV morphine and Ativan when necessary. Wet gurgly secretions heard. On scopolamine patch. Also Robinul when  necessary for secretions  Final diagnosis:  1. Sepsis  secondary to VRE UTI and C. difficile colitis 2. C. difficile colitis 3. Severe metabolic acidosis 4. Acute metabolic encephalopathy 5. Hypernatremia 6. ALS 7. GERD  Husband updated. Also discussed possibility of transfer to hospice home since vitals are stable- he wants to explore the option and get back to us.   All the records are reviewed and case discussed with Care Management/Social Workerr. Management plans discussed with the patient, family and they are in agreement.  CODE STATUS: DNR  TOTAL TIME TAKING CARE OF THIS PATIENT: 16 minutes.    Enid BaasKALISETTI,Korbyn Chopin M.D on 01/22/2016 at 9:32 AM  Between 7am to 6pm - Pager - [phone]  After 6pm go to [email or web address] - password [email or web address] [email or web address]  [facility]  Office  [phone]  CC: Primary care physician; Ruthe Mannanalia Aron, MD

## 2016-01-25 NOTE — Discharge Summary (Signed)
    Death Note please see Last Note for all details.   In breif -55 year old female with history of ALS admitted for sepsis, recurrent UTI, C. difficile colitis. Noted to have less responsive,hypotension at home. Found to have UTI, C. difficile colitis. Admitted for septic shock secondary to UTI, C. difficile colitis. Admitted to intensive care unit, started on Levophed, aggressive hydration. He also had acute renal failure for which she received IV hydration. Started on Flagyl, Zosyn. Patient seen by Dr. Hoyle BarrKaufer, Dr. Orvan Falconerampbell from palliative care. Patient developed severe respiratory distress and she was struggling to breathe and has severe metabolic acidosis. Because of her ALS and poor prognosis and chance of meaningful recovery are poor husband agreed to make her comfort care. Patient received a PICC line for IV access her pain medication. Started on comfort care measures with morphine drip, Ativan as needed. Patient is moved to 1C. Dr. comfort care on June 22, patient expired peacefully on June 29 morning at 1:30 AM.    Jennifer [name] [id number],MRN:[id number] is a 55 y.o. female, Outpatient Primary MD for the patient is Ruthe Mannanalia Aron, MD  Pronounced dead by registered nurse on 01/06/2016     @    1:30 AM.               Cause of death  Sepsis secondary to VRE UTI, C. difficile colitis Severe metabolic acidosis Acute metabolic encephalopathy Hyponatremia ALS GERD  Total clinical and documentation time for today Under 30 minutes   Last Note

## 2016-01-25 NOTE — Progress Notes (Signed)
Pt expired.  Friend at bedside.  Husband notified by friend.  See post-portem flowsheet for further details.

## 2016-01-25 NOTE — Progress Notes (Signed)
Husband and children present.  Grieving appropriately.  Nourishment provided.  Those requesting, warm blanket wrapped around their shoulders.

## 2016-01-25 NOTE — Progress Notes (Signed)
Husband at bedside.  Hospital chaplain called to provide family emotional support.

## 2016-01-25 DEATH — deceased

## 2016-10-20 IMAGING — CR DG ABDOMEN 1V
1 series · 1 of 1 positions shown · non-contrast
Comparison: 09/18/2015

CLINICAL DATA: Ileus

EXAM:
ABDOMEN - 1 VIEW

[ap]
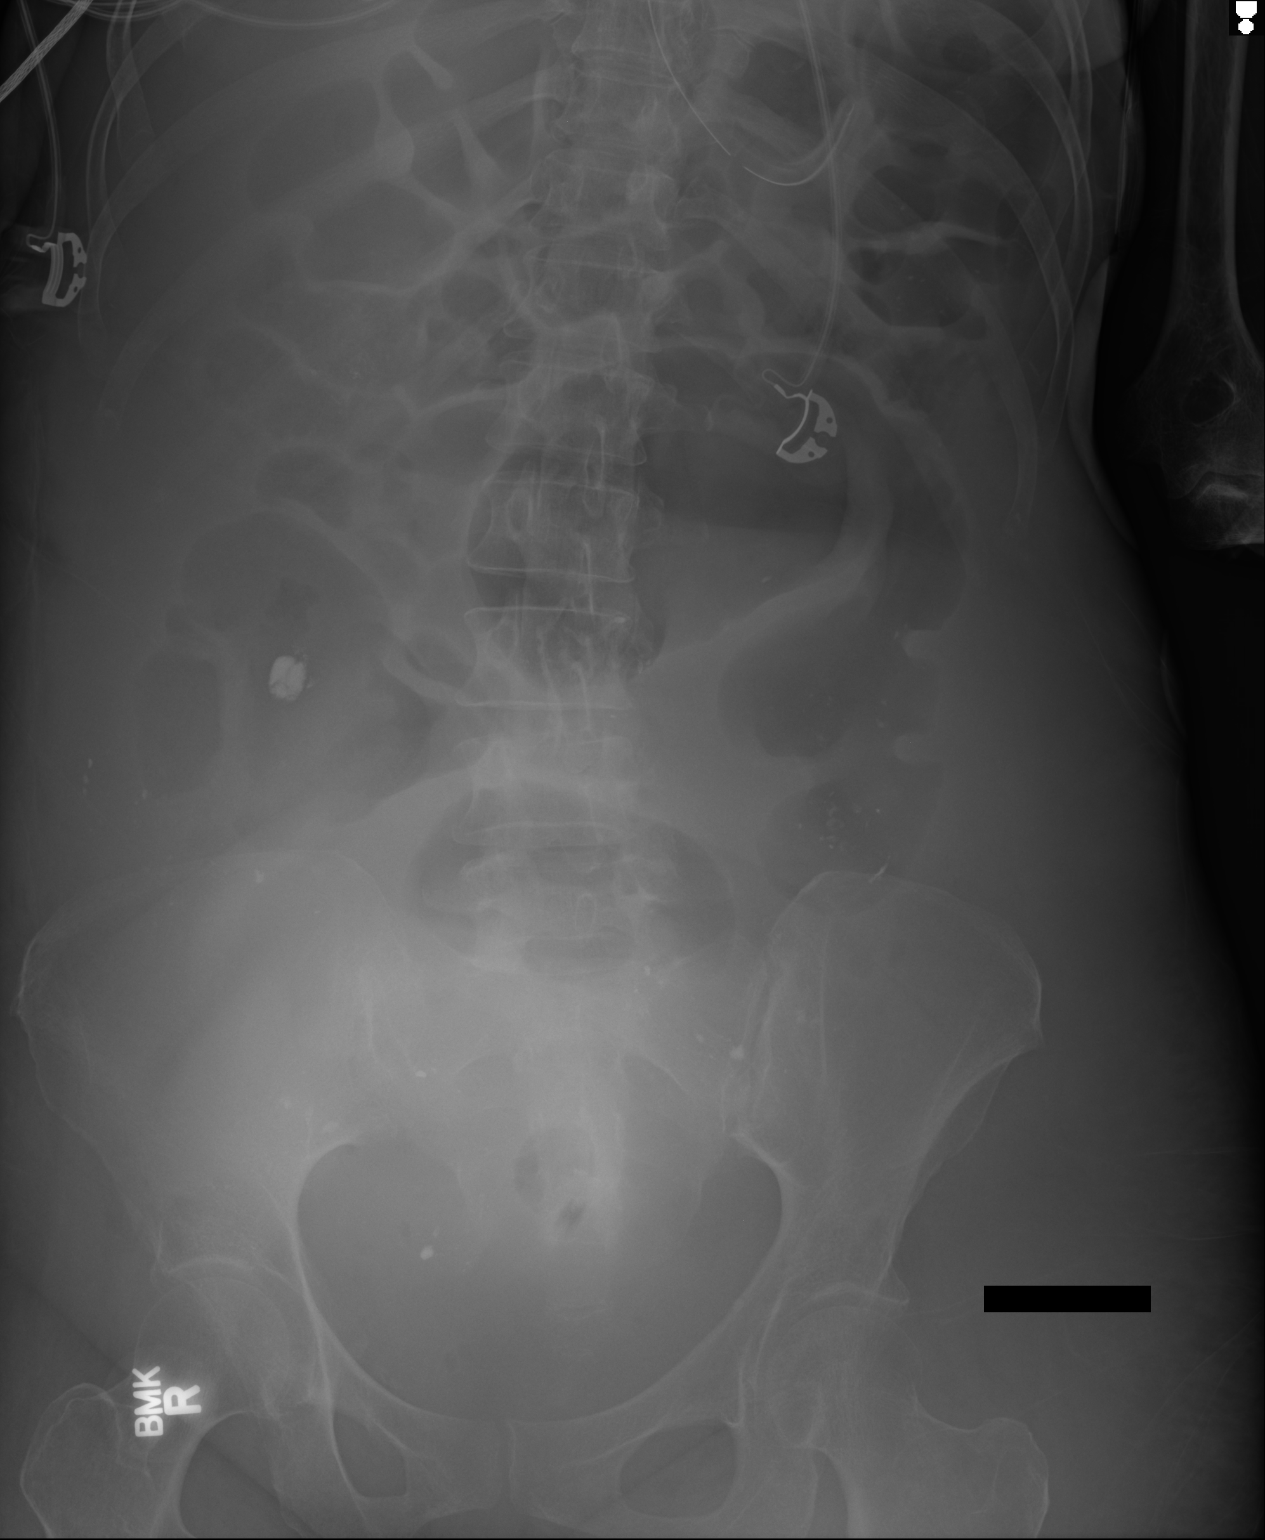

[1 of 1 positions shown; findings below may reference images not displayed]

FINDINGS: Improved bowel gas pattern. Decrease in bowel gas in the large and
small bowel compared with the prior study. Minimal air in the
rectum. NG tube in the stomach.
IMPRESSION: Decreased large and small bowel gas and distention compared with
prior study. Improving ileus.

## 2016-10-21 IMAGING — DX DG ABDOMEN 1V
1 series · 1 of 1 positions shown · non-contrast
Comparison: September 19, 2015

CLINICAL DATA: Nasogastric tube placement

EXAM:
ABDOMEN - 1 VIEW

[abdomen kub]
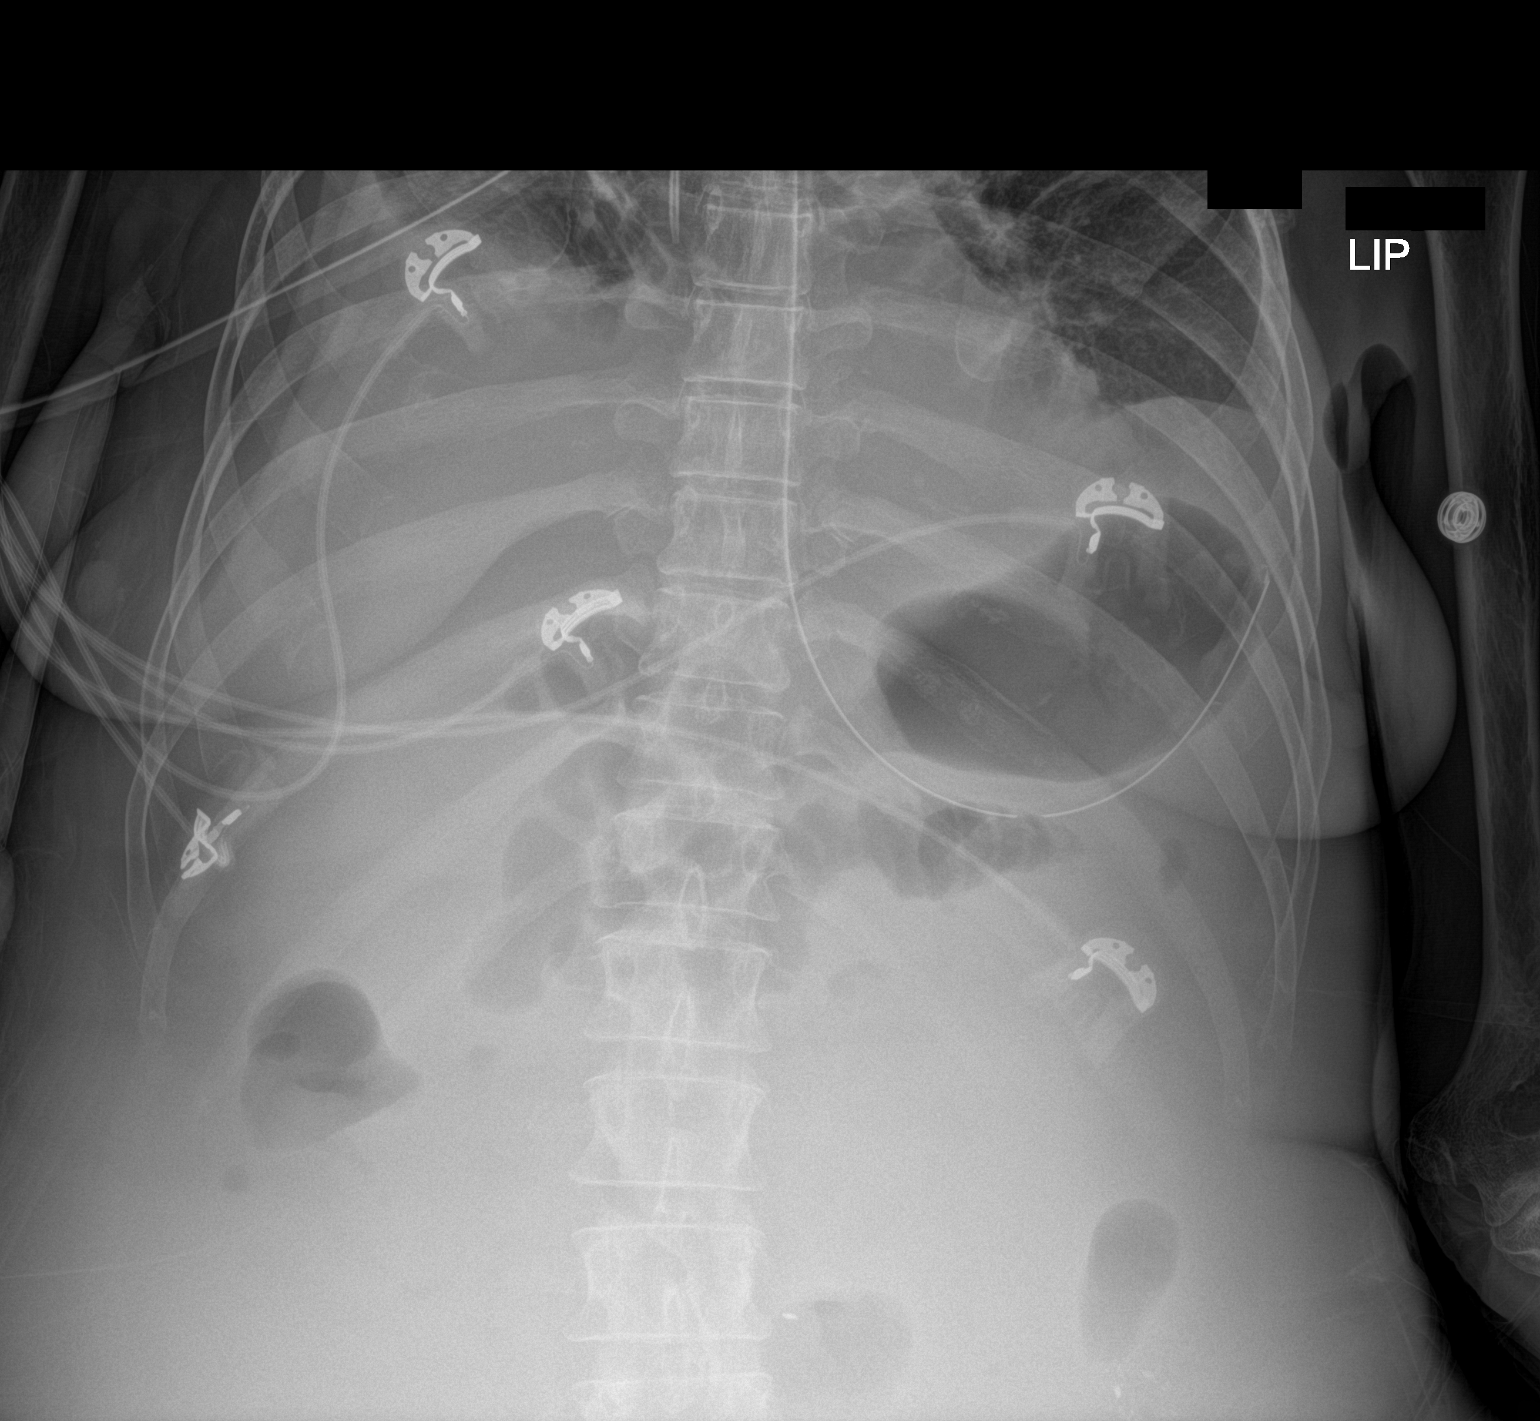

[1 of 1 positions shown; findings below may reference images not displayed]

FINDINGS: Nasogastric tube tip and side port are in the stomach. The bowel gas
pattern is unremarkable. No free air evident. There is atelectatic
change in the lung bases.
IMPRESSION: Feeding tube tip and side port in stomach. Bowel gas pattern
unremarkable.

## 2016-10-24 IMAGING — DX DG CHEST 1V PORT
1 series · 1 of 1 positions shown · non-contrast
Comparison: 09/16/2015 chest radiograph.

CLINICAL DATA: Shortness of breath and fever. Aspiration.
Inpatient.

EXAM:
PORTABLE CHEST 1 VIEW

[chest ap]
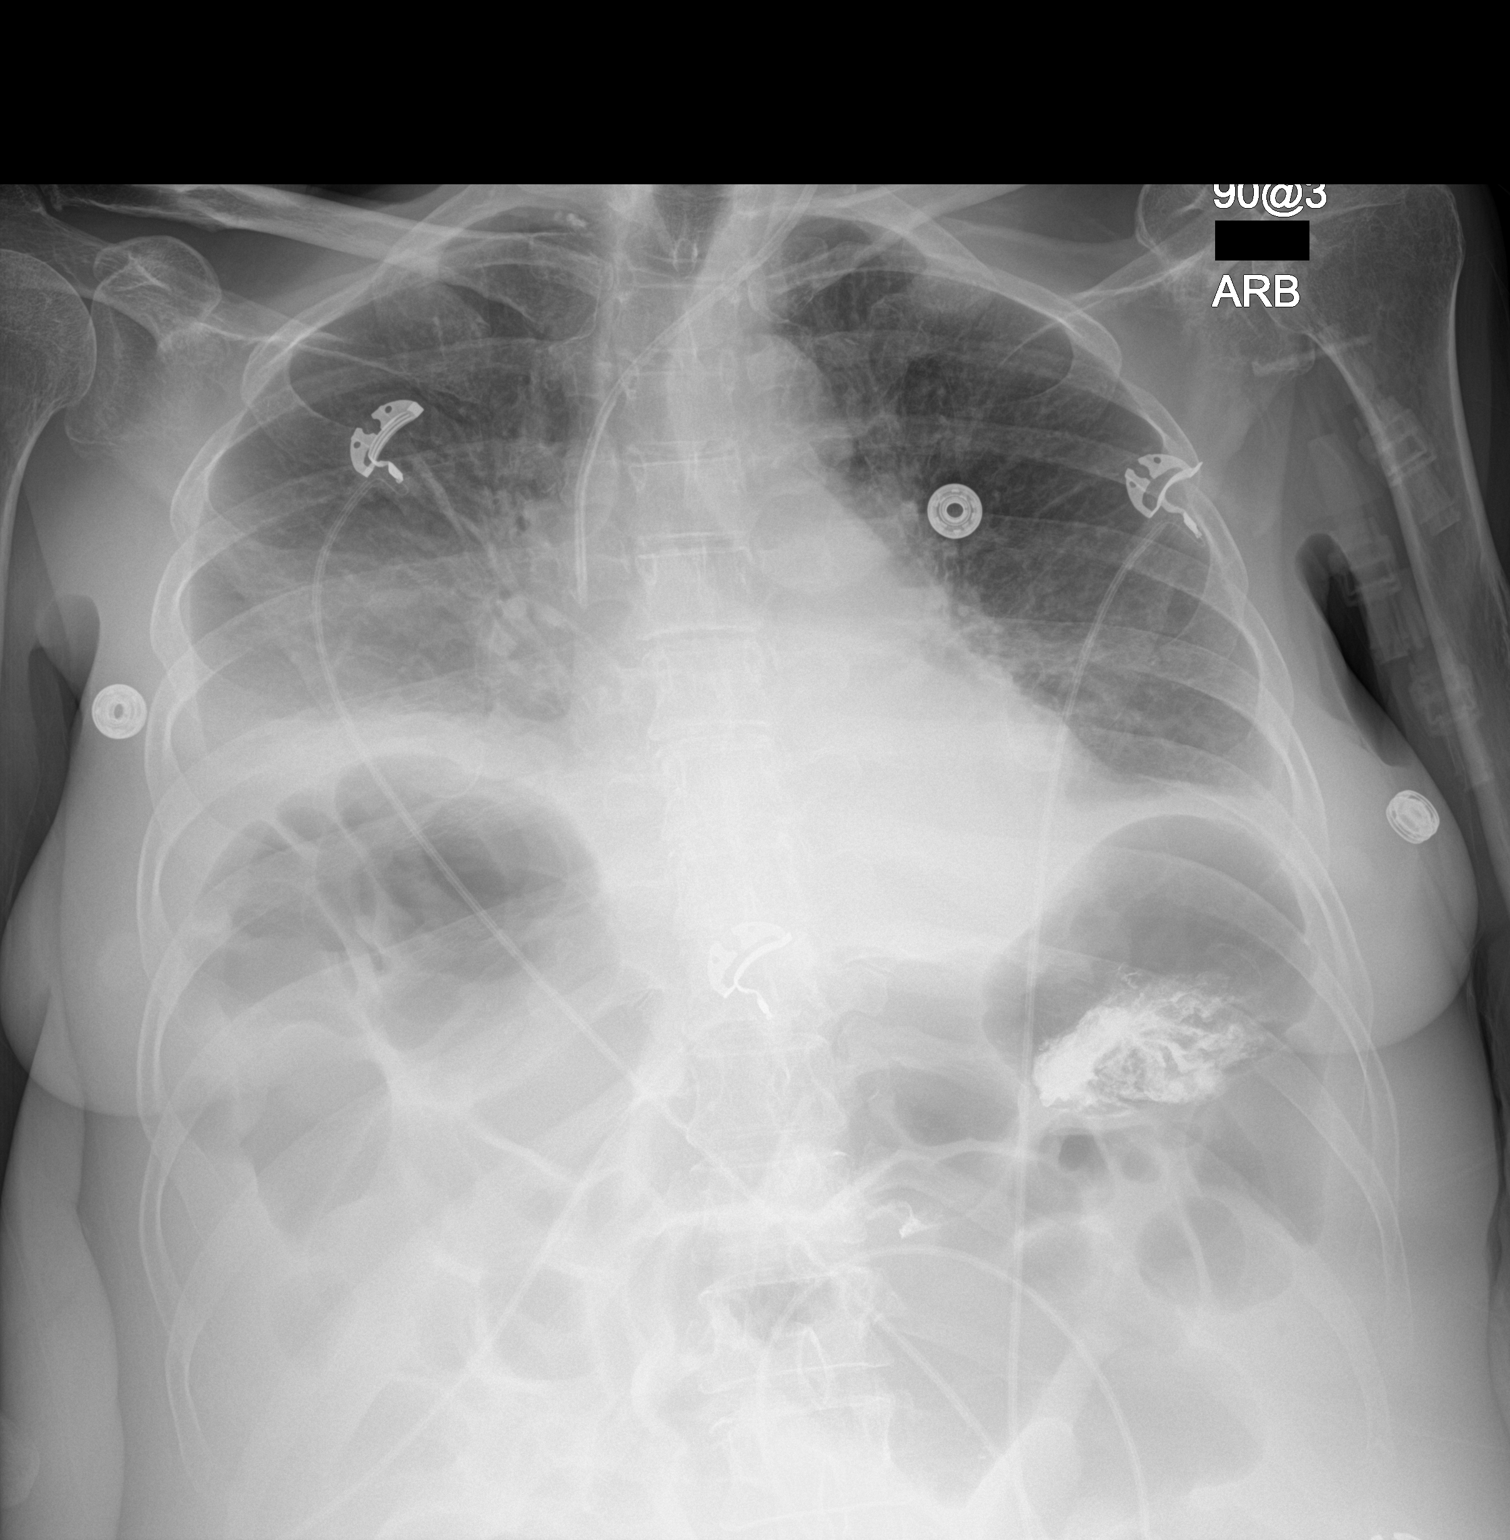

[1 of 1 positions shown; findings below may reference images not displayed]

FINDINGS: Left internal jugular central venous catheter terminates in the
lower third of the superior vena cava. Very low lung volumes. Stable
cardiomediastinal silhouette with normal heart size. No
pneumothorax. Small bilateral pleural effusions, right greater than
left, increased bilaterally. No overt pulmonary edema. Patchy and
curvilinear bibasilar lung opacities, increased on the right. Oral
contrast is seen in the gastric fundus.
IMPRESSION: 1. Very low lung volumes. Patchy and curvilinear bibasilar lung
opacities, increased on the right, likely atelectasis, cannot
exclude a component of aspiration or pneumonia.
2. Small bilateral pleural effusions, right greater the left,
increased bilaterally.

## 2017-02-13 IMAGING — DX DG CHEST 1V PORT
1 series · 1 of 1 positions shown · non-contrast
Comparison: Chest x-ray 01/13/2016 01/11/2016. Abdominal series [DATE].

CLINICAL DATA: Respiratory failure.

EXAM:
PORTABLE CHEST 1 VIEW

[chest ap]
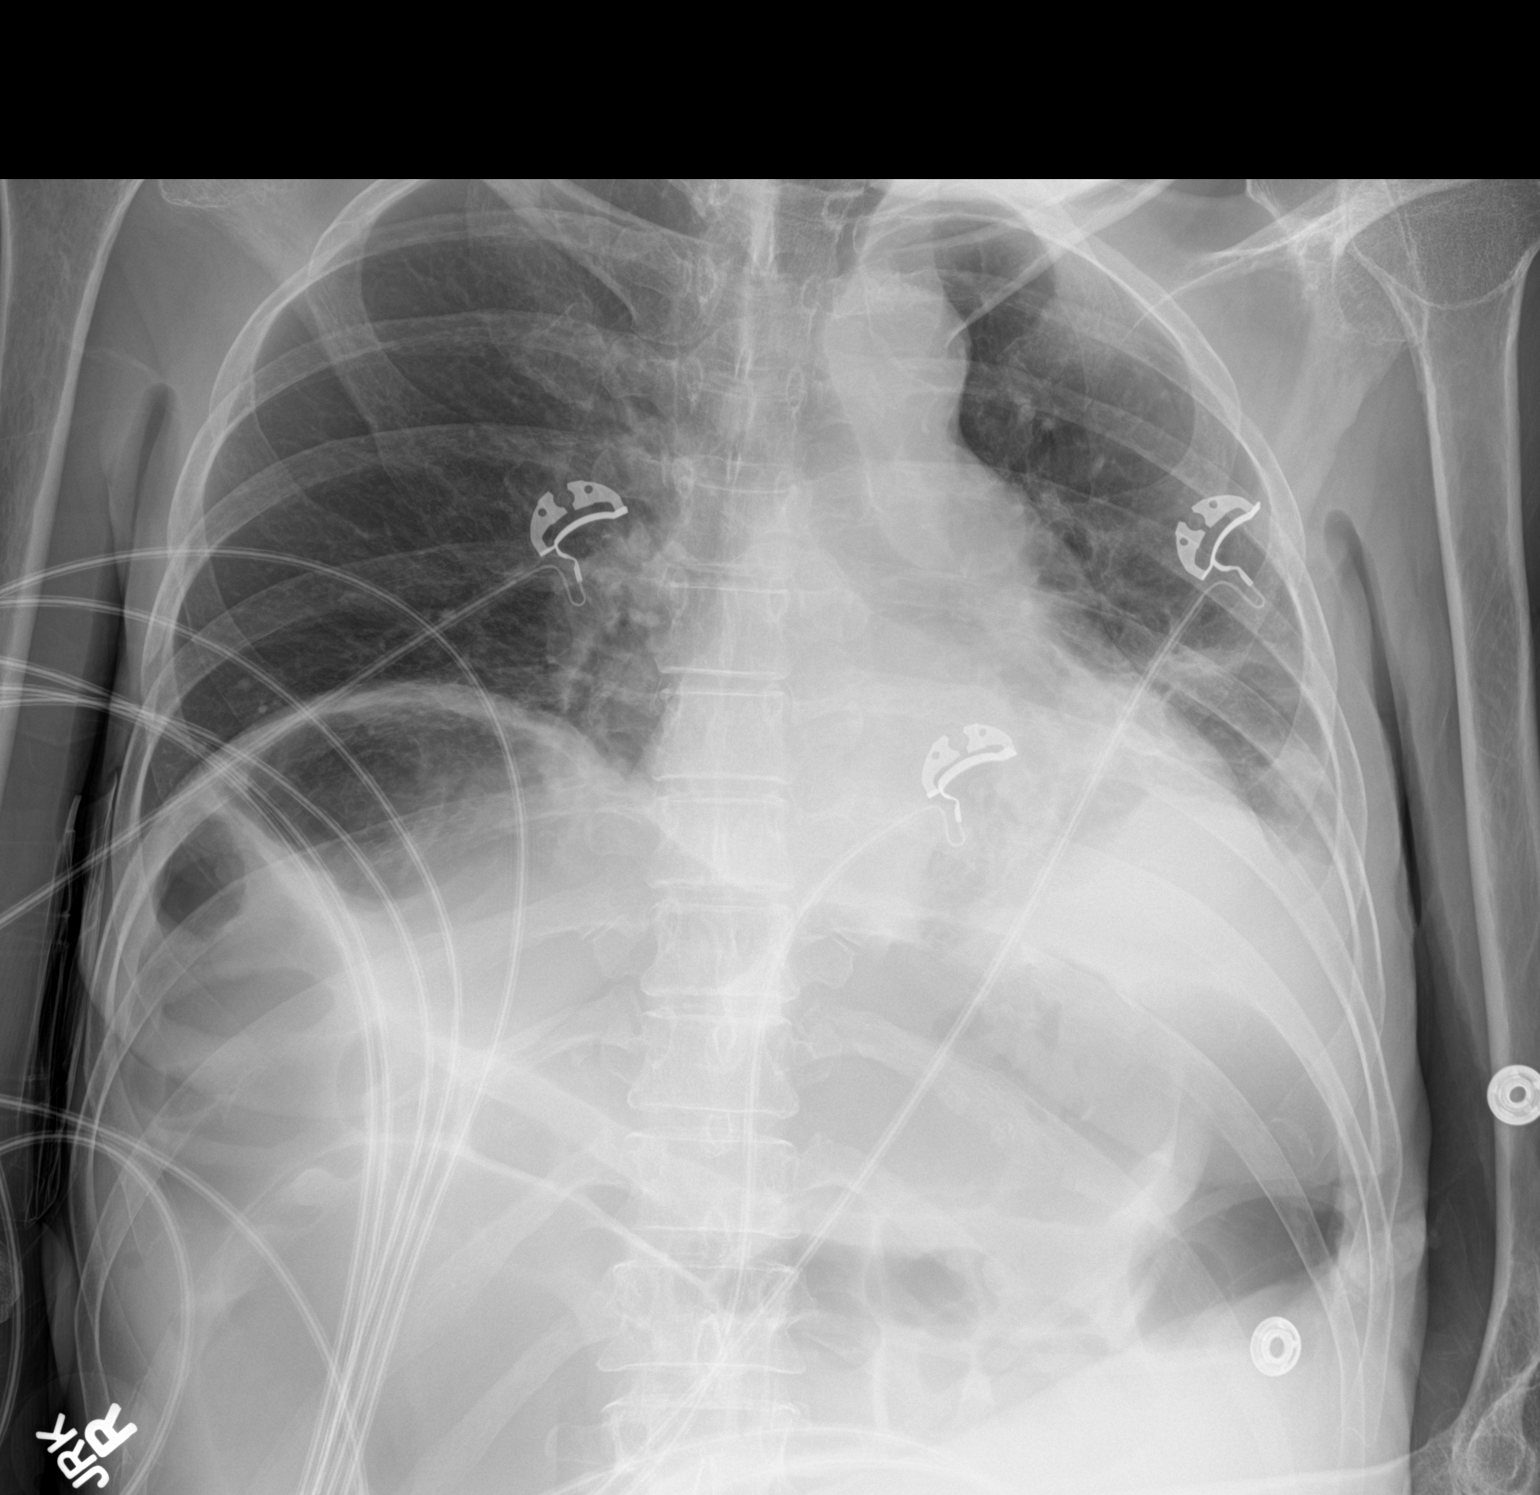

[1 of 1 positions shown; findings below may reference images not displayed]

FINDINGS: Mediastinum hilar structures are normal. Low lung volumes with
bibasilar atelectasis and or infiltrates. Interim near complete
clearing of left upper lobe infiltrate. Heart size stable. Small
left pleural effusion. Progressive bowel distention. Abdominal
series suggested for further evaluation. Interposition of the colon
under the right hemidiaphragm noted. No acute osseous abnormality.
IMPRESSION: 1. Low lung volumes with bibasilar atelectasis and/or infiltrates
again noted. Interim near complete clearing of left upper lobe
infiltrate.

2. Progressive bowel distention. Abdominal series suggested for
further evaluation.

## 2017-02-14 IMAGING — DX DG ABDOMEN 1V
1 series · 1 of 1 positions shown · non-contrast
Comparison: 01/12/2016

CLINICAL DATA: Abdominal distension

EXAM:
ABDOMEN - 1 VIEW

[abdomen kub]
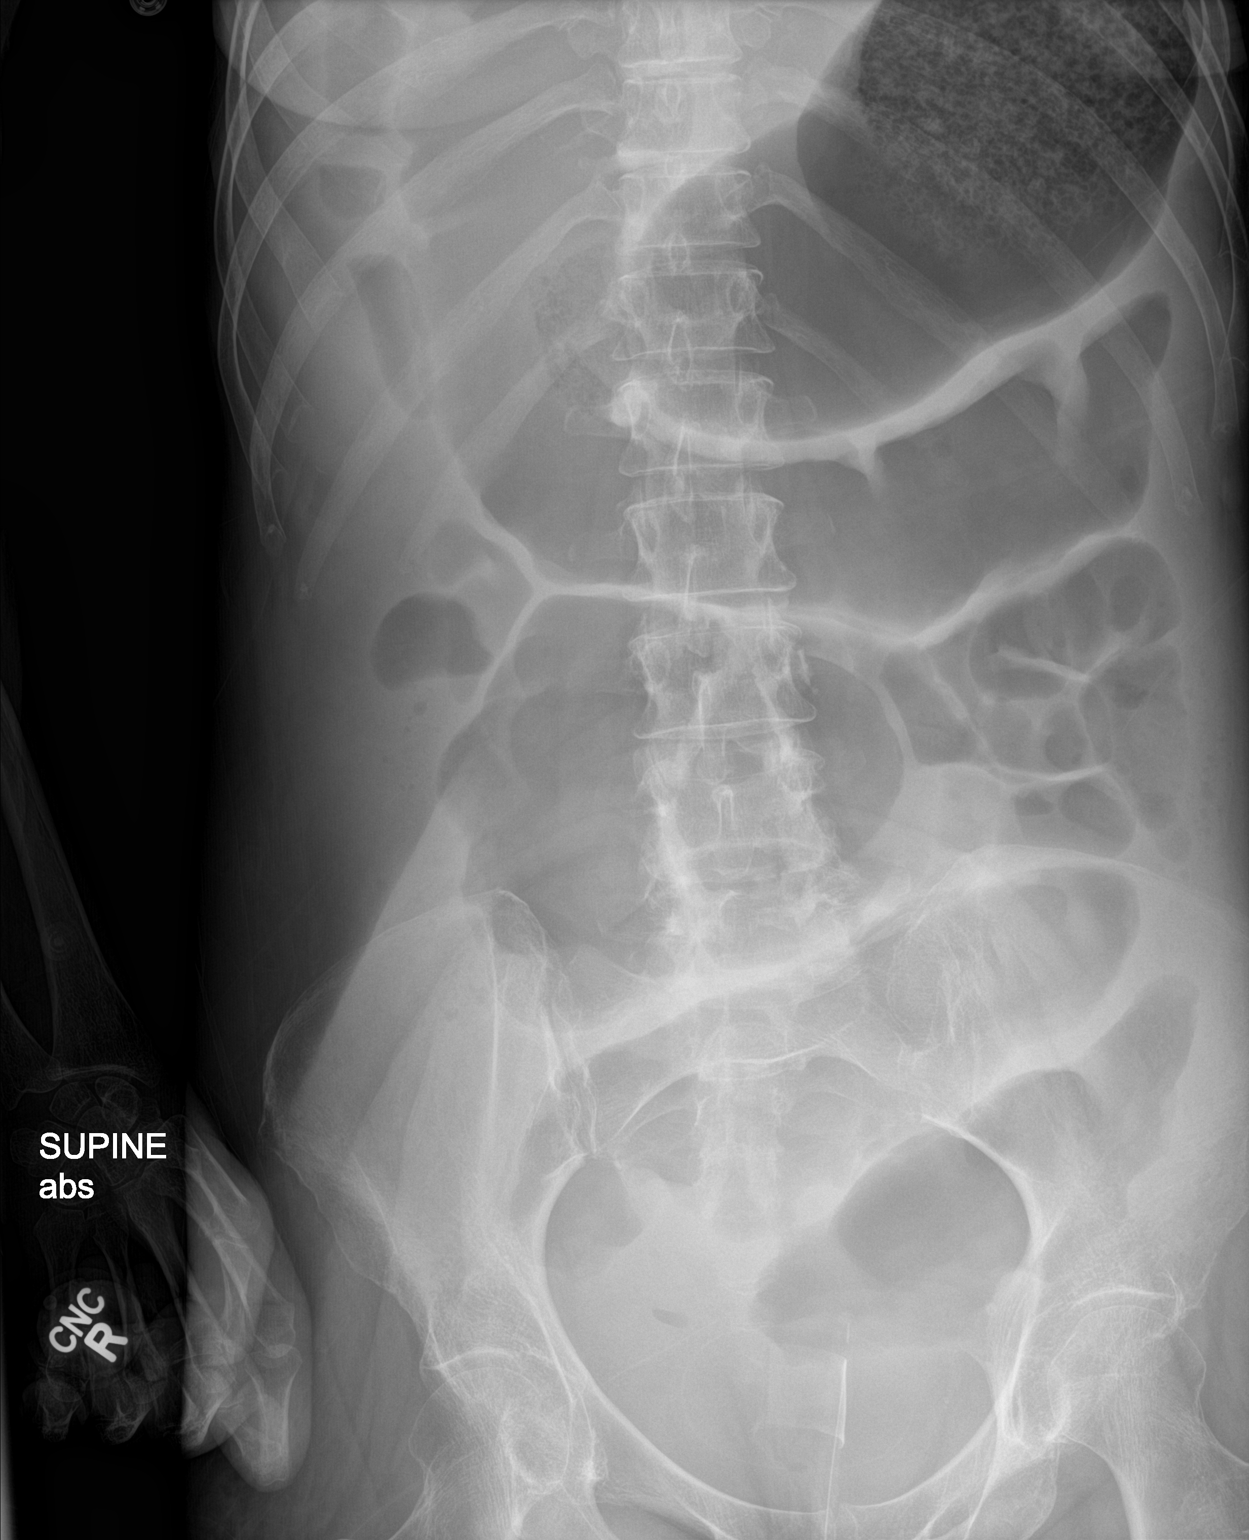

[1 of 1 positions shown; findings below may reference images not displayed]

FINDINGS: Moderate gaseous distension of the stomach. Again noted gaseous
distension of the colon without significant change from prior exam.
Less distension of the rectum. Again noted a rectal tube. Mild
gaseous distended small bowel loops with slight improvement from
prior exam.
IMPRESSION: There is moderate gaseous distension of the stomach. Persistent
gaseous distension of the colon. Less distension of the rectum. Mild
gaseous distended small bowel loops with slight improvement from
prior exam. Again findings suspicious for diffuse ileus.

## 2017-04-07 ENCOUNTER — Other Ambulatory Visit: Payer: Self-pay | Admitting: Family Medicine
